# Patient Record
Sex: Female | Born: 1975
Health system: Southern US, Community
[De-identification: ages and names within clinical notes are randomized; demographics above are authoritative.]

## PROBLEM LIST (undated history)

## (undated) ENCOUNTER — Inpatient Hospital Stay (HOSPITAL_COMMUNITY): Payer: Self-pay

## (undated) DIAGNOSIS — I10 Essential (primary) hypertension: Secondary | ICD-10-CM

## (undated) DIAGNOSIS — M1990 Unspecified osteoarthritis, unspecified site: Secondary | ICD-10-CM

## (undated) DIAGNOSIS — E7800 Pure hypercholesterolemia, unspecified: Secondary | ICD-10-CM

## (undated) DIAGNOSIS — F32A Depression, unspecified: Secondary | ICD-10-CM

## (undated) DIAGNOSIS — K219 Gastro-esophageal reflux disease without esophagitis: Secondary | ICD-10-CM

## (undated) DIAGNOSIS — J45909 Unspecified asthma, uncomplicated: Secondary | ICD-10-CM

## (undated) DIAGNOSIS — D571 Sickle-cell disease without crisis: Secondary | ICD-10-CM

## (undated) DIAGNOSIS — K5090 Crohn's disease, unspecified, without complications: Secondary | ICD-10-CM

## (undated) DIAGNOSIS — D649 Anemia, unspecified: Secondary | ICD-10-CM

## (undated) DIAGNOSIS — T7840XA Allergy, unspecified, initial encounter: Secondary | ICD-10-CM

## (undated) DIAGNOSIS — F419 Anxiety disorder, unspecified: Secondary | ICD-10-CM

## (undated) DIAGNOSIS — N61 Mastitis without abscess: Secondary | ICD-10-CM

## (undated) HISTORY — PX: CHOLECYSTECTOMY: SHX55

## (undated) HISTORY — DX: Anemia, unspecified: D64.9

## (undated) HISTORY — DX: Sickle-cell disease without crisis: D57.1

## (undated) HISTORY — DX: Depression, unspecified: F32.A

## (undated) HISTORY — DX: Unspecified asthma, uncomplicated: J45.909

## (undated) HISTORY — DX: Allergy, unspecified, initial encounter: T78.40XA

## (undated) HISTORY — DX: Anxiety disorder, unspecified: F41.9

## (undated) HISTORY — DX: Unspecified osteoarthritis, unspecified site: M19.90

## (undated) HISTORY — DX: Mastitis without abscess: N61.0

---
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
## 2001-06-22 ENCOUNTER — Other Ambulatory Visit: Admission: RE | Admit: 2001-06-22 | Discharge: 2001-06-22 | Payer: Self-pay | Admitting: Family Medicine

## 2001-08-16 ENCOUNTER — Ambulatory Visit (HOSPITAL_COMMUNITY): Admission: RE | Admit: 2001-08-16 | Discharge: 2001-08-16 | Payer: Self-pay | Admitting: General Surgery

## 2001-08-16 ENCOUNTER — Encounter: Payer: Self-pay | Admitting: General Surgery

## 2001-08-21 ENCOUNTER — Ambulatory Visit (HOSPITAL_COMMUNITY): Admission: RE | Admit: 2001-08-21 | Discharge: 2001-08-21 | Payer: Self-pay | Admitting: General Surgery

## 2001-08-21 ENCOUNTER — Encounter: Payer: Self-pay | Admitting: General Surgery

## 2001-08-29 ENCOUNTER — Ambulatory Visit (HOSPITAL_COMMUNITY): Admission: RE | Admit: 2001-08-29 | Discharge: 2001-08-29 | Payer: Self-pay | Admitting: General Surgery

## 2001-11-23 ENCOUNTER — Encounter: Payer: Self-pay | Admitting: Internal Medicine

## 2001-11-23 ENCOUNTER — Ambulatory Visit (HOSPITAL_COMMUNITY): Admission: RE | Admit: 2001-11-23 | Discharge: 2001-11-23 | Payer: Self-pay | Admitting: Internal Medicine

## 2002-01-01 ENCOUNTER — Encounter: Admission: RE | Admit: 2002-01-01 | Discharge: 2002-04-01 | Payer: Self-pay | Admitting: Family Medicine

## 2002-02-06 ENCOUNTER — Ambulatory Visit (HOSPITAL_COMMUNITY): Admission: RE | Admit: 2002-02-06 | Discharge: 2002-02-06 | Payer: Self-pay | Admitting: Obstetrics and Gynecology

## 2002-02-06 ENCOUNTER — Encounter: Payer: Self-pay | Admitting: Obstetrics and Gynecology

## 2002-04-23 ENCOUNTER — Ambulatory Visit (HOSPITAL_COMMUNITY): Admission: RE | Admit: 2002-04-23 | Discharge: 2002-04-23 | Payer: Self-pay | Admitting: Obstetrics and Gynecology

## 2002-08-08 ENCOUNTER — Emergency Department (HOSPITAL_COMMUNITY): Admission: EM | Admit: 2002-08-08 | Discharge: 2002-08-08 | Payer: Self-pay | Admitting: Emergency Medicine

## 2002-08-08 ENCOUNTER — Encounter: Payer: Self-pay | Admitting: Emergency Medicine

## 2003-12-24 ENCOUNTER — Ambulatory Visit (HOSPITAL_COMMUNITY): Admission: RE | Admit: 2003-12-24 | Discharge: 2003-12-24 | Payer: Self-pay | Admitting: Family Medicine

## 2005-01-03 ENCOUNTER — Emergency Department (HOSPITAL_COMMUNITY): Admission: EM | Admit: 2005-01-03 | Discharge: 2005-01-03 | Payer: Self-pay | Admitting: Emergency Medicine

## 2006-08-26 ENCOUNTER — Emergency Department (HOSPITAL_COMMUNITY): Admission: EM | Admit: 2006-08-26 | Discharge: 2006-08-26 | Payer: Self-pay | Admitting: Emergency Medicine

## 2006-09-20 ENCOUNTER — Emergency Department (HOSPITAL_COMMUNITY): Admission: EM | Admit: 2006-09-20 | Discharge: 2006-09-20 | Payer: Self-pay | Admitting: Emergency Medicine

## 2006-09-24 ENCOUNTER — Emergency Department (HOSPITAL_COMMUNITY): Admission: EM | Admit: 2006-09-24 | Discharge: 2006-09-24 | Payer: Self-pay | Admitting: Emergency Medicine

## 2006-12-24 ENCOUNTER — Inpatient Hospital Stay (HOSPITAL_COMMUNITY): Admission: EM | Admit: 2006-12-24 | Discharge: 2006-12-27 | Payer: Self-pay | Admitting: Emergency Medicine

## 2007-01-16 ENCOUNTER — Inpatient Hospital Stay (HOSPITAL_COMMUNITY): Admission: AD | Admit: 2007-01-16 | Discharge: 2007-01-17 | Payer: Self-pay | Admitting: Obstetrics & Gynecology

## 2007-01-23 ENCOUNTER — Inpatient Hospital Stay (HOSPITAL_COMMUNITY): Admission: AD | Admit: 2007-01-23 | Discharge: 2007-02-28 | Payer: Self-pay | Admitting: Obstetrics and Gynecology

## 2007-02-26 ENCOUNTER — Ambulatory Visit (HOSPITAL_COMMUNITY): Admission: RE | Admit: 2007-02-26 | Discharge: 2007-02-26 | Payer: Self-pay | Admitting: Obstetrics and Gynecology

## 2007-02-28 ENCOUNTER — Ambulatory Visit (HOSPITAL_COMMUNITY): Admission: RE | Admit: 2007-02-28 | Discharge: 2007-02-28 | Payer: Self-pay | Admitting: Obstetrics and Gynecology

## 2007-04-06 ENCOUNTER — Inpatient Hospital Stay (HOSPITAL_COMMUNITY): Admission: AD | Admit: 2007-04-06 | Discharge: 2007-04-06 | Payer: Self-pay | Admitting: Obstetrics and Gynecology

## 2007-05-12 ENCOUNTER — Inpatient Hospital Stay (HOSPITAL_COMMUNITY): Admission: AD | Admit: 2007-05-12 | Discharge: 2007-05-16 | Payer: Self-pay | Admitting: Obstetrics and Gynecology

## 2007-05-13 ENCOUNTER — Encounter (INDEPENDENT_AMBULATORY_CARE_PROVIDER_SITE_OTHER): Payer: Self-pay | Admitting: Obstetrics and Gynecology

## 2007-09-26 ENCOUNTER — Emergency Department (HOSPITAL_COMMUNITY): Admission: EM | Admit: 2007-09-26 | Discharge: 2007-09-26 | Payer: Self-pay | Admitting: Emergency Medicine

## 2010-05-21 ENCOUNTER — Ambulatory Visit (HOSPITAL_COMMUNITY): Admission: RE | Admit: 2010-05-21 | Discharge: 2010-05-21 | Payer: Self-pay | Admitting: Family Medicine

## 2010-06-30 ENCOUNTER — Ambulatory Visit (HOSPITAL_COMMUNITY): Admission: RE | Admit: 2010-06-30 | Discharge: 2010-06-30 | Payer: Self-pay | Admitting: Family Medicine

## 2010-11-28 ENCOUNTER — Encounter: Payer: Self-pay | Admitting: Family Medicine

## 2011-01-06 HISTORY — PX: LAPAROSCOPIC ENDOMETRIOSIS FULGURATION: SUR769

## 2011-01-07 ENCOUNTER — Other Ambulatory Visit: Payer: Self-pay | Admitting: Obstetrics and Gynecology

## 2011-01-31 ENCOUNTER — Encounter (HOSPITAL_COMMUNITY)
Admission: RE | Admit: 2011-01-31 | Discharge: 2011-01-31 | Disposition: A | Discharge status: Home / Self Care | Payer: BC Managed Care – PPO | Source: Ambulatory Visit | Attending: Obstetrics and Gynecology | Admitting: Obstetrics and Gynecology

## 2011-01-31 LAB — BASIC METABOLIC PANEL
BUN: 4 mg/dL — ABNORMAL LOW (ref 6–23)
CO2: 25 mEq/L (ref 19–32)
Calcium: 9.2 mg/dL (ref 8.4–10.5)
Chloride: 108 mEq/L (ref 96–112)
Creatinine, Ser: 0.62 mg/dL (ref 0.4–1.2)
GFR calc Af Amer: >60 mL/min (ref >60)
GFR calc non Af Amer: >60 mL/min (ref >60)
Glucose, Bld: 112 mg/dL — ABNORMAL HIGH (ref 70–99)
Potassium: 3.7 mEq/L (ref 3.5–5.1)
Sodium: 139 mEq/L (ref 135–145)

## 2011-01-31 LAB — CBC
HCT: 40.4 % (ref 36.0–46.0)
Hemoglobin: 13.4 g/dL (ref 12.0–15.0)
MCH: 25.7 pg — ABNORMAL LOW (ref 26.0–34.0)
MCHC: 33.2 g/dL (ref 30.0–36.0)
MCV: 77.5 fL — ABNORMAL LOW (ref 78.0–100.0)
Platelets: 265 10*3/uL (ref 150–400)
RBC: 5.21 MIL/uL — ABNORMAL HIGH (ref 3.87–5.11)
RDW: 13.7 % (ref 11.5–15.5)
WBC: 8.5 10*3/uL (ref 4.0–10.5)

## 2011-01-31 LAB — SURGICAL PCR SCREEN
MRSA, PCR: NEGATIVE
Staphylococcus aureus: NEGATIVE

## 2011-02-04 ENCOUNTER — Ambulatory Visit (HOSPITAL_COMMUNITY)
Admission: RE | Admit: 2011-02-04 | Discharge: 2011-02-04 | Disposition: A | Discharge status: Home / Self Care | Payer: BC Managed Care – PPO | Source: Ambulatory Visit | Attending: Obstetrics and Gynecology | Admitting: Obstetrics and Gynecology

## 2011-02-04 DIAGNOSIS — Z01812 Encounter for preprocedural laboratory examination: Secondary | ICD-10-CM | POA: Insufficient documentation

## 2011-02-04 DIAGNOSIS — R1032 Left lower quadrant pain: Secondary | ICD-10-CM | POA: Insufficient documentation

## 2011-02-04 DIAGNOSIS — Z01818 Encounter for other preprocedural examination: Secondary | ICD-10-CM | POA: Insufficient documentation

## 2011-02-04 LAB — GLUCOSE, CAPILLARY: Glucose-Capillary: 82 mg/dL (ref 70–99)

## 2011-02-04 LAB — PREGNANCY, URINE: Preg Test, Ur: NEGATIVE

## 2011-02-07 LAB — GLUCOSE, CAPILLARY: Glucose-Capillary: 108 mg/dL — ABNORMAL HIGH (ref 70–99)

## 2011-02-23 NOTE — Op Note (Signed)
  [name], [name]            ACCOUNT NO.:  [id number]  MEDICAL RECORD NO.:  [id number]           PATIENT TYPE:  O  LOCATION:  [facility]                          FACILITY:  [facility]  PHYSICIAN:  Servando Salina, M.D.DATE OF BIRTH:  1976-03-02  DATE OF PROCEDURE:  02/04/2011 DATE OF DISCHARGE:                              OPERATIVE REPORT   PREOPERATIVE DIAGNOSIS:  Left lower quadrant pain.  POSTOPERATIVE DIAGNOSIS:  Left lower quadrant pain.  PROCEDURE:  Diagnostic laparoscopy.  ANESTHESIA:  General.  SURGEON.:  Servando Salina, MD  ASSISTANT:  Artelia Laroche, CNM  PROCEDURE:  Under adequate general anesthesia, the patient was placed in dorsal lithotomy position.  She was sterilely prepped and draped in usual fashion.  An indwelling Foley catheter was sterilely placed. Examination under anesthesia revealed an anteverted uterus, slightly irregular.  No adnexal masses could be appreciated.  A bivalve speculum was placed in vagina.  Single-tooth tenaculum was placed on the anterior lip of the cervix and acorn cannula was introduced into the cervical os and attached to the tenaculum for manipulation of the uterus.  The bivalve speculum was then removed.  Attention was then turned to the abdomen.  A supraumbilical incision was made.  Veress needle was introduced.  Saline was used to test the placement of the Veress needle. The opening pressure of 7 was noted.  Three liters of CO2 was insufflated. The Veress needle was then removed.  A 12-mm trocar with sleeve was introduced into the abdomen without incident.  Using [employer] robotic camera, the pelvis was inspected.  The uterus was irregular in shape.  The posterior cul-de-sac without any lesions.  The left ovary was elongated, but otherwise normal.  Both tubes were normal bilaterally.  No evidence of endometriosis or any lesions noted. Complete upper abdomen was also normal.  The procedure was therefore completed by the port  being removed.  The abdomen deflated.  The incisions closed with 0 Vicryl for fascial stitch and 4-0 Vicryl subcuticular stitch and the instruments in the vagina removed.  SPECIMENS:  None.  ESTIMATED BLOOD LOSS:  Minimal.  COMPLICATIONS:  None.  The patient tolerated procedure well, was transferred to recovery room in stable condition.     Servando Salina, M.D.     [place]/MEDQ  D:  02/04/2011  T:  02/05/2011  Job:  [id number]  Electronically Signed by Alanda Slim Chany Woolworth M.D. on 02/23/2011 03:57:40 PM

## 2011-03-22 NOTE — Discharge Summary (Signed)
[name], [name]             ACCOUNT NO.:  [id number]   MEDICAL RECORD NO.:  [id number]          PATIENT TYPE:  INP   LOCATION:  9133                          FACILITY:  [facility]   PHYSICIAN:  Naima A. Dillard, M.D. DATE OF BIRTH:  1976/05/02   DATE OF ADMISSION:  05/12/2007  DATE OF DISCHARGE:  05/16/2007                               DISCHARGE SUMMARY   ADMISSION DIAGNOSIS:  1. Intrauterine pregnancy at 38-5/7 weeks.  2. History of incompetent cervix with cerclage removal at 36 weeks.  3. Insulin-dependent diabetes with the patient managed on insulin      regimen at home.  4. Chronic hypertension with Aldomet 500 mg t.i.d.  5. Group B strep negative.   DISCHARGE DIAGNOSES:  1. Macrosomia.  2. Failure to progress.  3. Failed induction.  4. Chorioamnionitis.   PROCEDURE:  Primary low transverse cesarean section.   HOSPITAL COURSE:  The patient was admitted on the morning of the May 12, 2007. Her cervix was 3 cm, 70%, vertex, minus two. Membranes were  ruptured and low-dose Pitocin was started per protocol. Blood pressures  remained stable. IUPC was placed in the afternoon of July 5. By 10:00  p.m. the patient progressed to 4-5 cm, MVU's were still not adequate. By  9:00 in the morning the patient had a temperature of 100.4, blood sugars  were stable.  Fetal heart tones were stable.  Cervix was 8 cm, 100%  effaced, vertex at the +1 station.  The patient had been without  cervical change for greater than 4 hours.  She was offered a cesarean  section and agreed to proceed.  She was prepared for the operating room  and underwent cesarean section by Dr. Charlesetta Garibaldi with Donnel Saxon  certified nurse midwife as first assistant. Infant was a viable female  with Apgars of 9 and 10, delivery time was 10:04 a.m.  Weight was 9  pounds 15 ounces. Infant was taken to the full-term nursery in good  condition.  The patient was taken to the recovery room and was doing  well. By postop day #1  she was breast-feeding, hemoglobin was 12.4 and  had been 13.9 preoperatively, blood pressures were stable.  She was  taking hydrochlorothiazide. She started metformin in the morning of day  one. Postop fasting blood sugar was 122, 2-hour p.c. dinner the night  before on July 6 had been 209. By postop day #2 the patient was doing  well.  Fasting blood sugar was 133, 2-hour p.c. were 132 and 166. By  postop day #3 she had changed to bottle feedings, fasting blood sugar  was 112, 2-hour PC dinner been 118, blood pressure was stable at 121/72.  Her JP drain had partially removed and removal was complete by the CNM  prior to discharge. The patient was deemed to have received full benefit  of her hospital stay and she was discharged home.   DISCHARGE MEDICATIONS:  1. Motrin 600 mg one p.o. q.6 hours p.r.n. pain.  2. Tylox one to two p.o. q.3-4 hours p.r.n. pain.  3. Metformin 500 mg one p.o. t.i.d.  4.  Humalog sliding scale was given to the patient.   Discharge follow-up will occur at [facility] in 4 weeks or  as needed.  The patient is also to call with fasting blood sugars  continually greater than 90, 2-hour postprandial greater than 120.      Cindy [name], C.N.M.      Naima A. Charlesetta Garibaldi, M.D.  Electronically Signed    [place]/MEDQ  D:  05/16/2007  T:  05/16/2007  Job:  [id number]

## 2011-03-22 NOTE — Op Note (Signed)
NAMEVILMA, WILL             ACCOUNT NO.:  [id number]   MEDICAL RECORD NO.:  [id number]          PATIENT TYPE:  INP   LOCATION:  9133                          FACILITY:  [facility]   PHYSICIAN:  Naima A. Dillard, M.D. DATE OF BIRTH:  1975/11/26   DATE OF PROCEDURE:  05/12/2007  DATE OF DISCHARGE:                               OPERATIVE REPORT   PREOPERATIVE DIAGNOSIS:  Pregnancy at term, type 2 diabetes on insulin,  chronic hypertension, failure to progress, failed induction, and  chorioamnionitis.   POSTOPERATIVE DIAGNOSIS:  Pregnancy at term, type 2 diabetes on insulin,  chronic hypertension, failure to progress, failed induction, and  chorioamnionitis.   PROCEDURE:  Primary low transverse cesarean section.   ANESTHESIA:  Epidural.   SURGEON:  Dr. Charlesetta Garibaldi   ASSISTANT:  Cathlean Marseilles. Latham, C.N.M.   ESTIMATED BLOOD LOSS:  650 mL.   URINE OUTPUT:  Is 500 mL.   IV FLUIDS:  1300 mL crystalloid.   COMPLICATIONS:  None.   FINDINGS:  Are female infant in vertex presentation with clear fluid.  Weight was 9 pounds 15 ounces with Apgars of 9 and 10. Normal appearing  tubes and ovaries.  The patient was noted to have uterine fibroids and  one about 5 cm at the fundus and one on the patient's left side of the  cornu about 3 cm.  Normal-appearing tubes and ovaries.  Placenta was  sent to pathology.   DESCRIPTION OF PROCEDURE:  The patient was taken to the operating room  where her epidural anesthesia was found to be adequate.  She was placed  in dorsal supine position with left lateral tilt.  The Pfannenstiel skin  incision was made with the scalpel and carried down to the fascia using  Bovie cautery and the fascia was incised in the midline and extended  bilaterally with Mayo scissors.  Kochers x2 were placed in the superior  aspect of the fascia was dissected off the rectus muscles both sharply  and bluntly.  The inferior aspect of the fascia was dissected in similar  fashion.   Peritoneum was identified, tented up and entered sharply  Metzenbaum scissors and extended superiorly and inferiorly with good  visualization of bowel and bladder and the muscle was separated midline.  The bladder blade was inserted.  Vesicouterine peritoneum was  identified, tented up and entered sharply and extended transversely  bilaterally using Metzenbaum scissors.  The bladder blade was  reinserted.  A primary low transverse uterine incision was made with the  scalpel and extended bluntly transversely.  The head was delivered and  the body without difficulty.  Cord was clamped and cut.  Placenta was  removed without difficulty.  Cord blood was a obtained by the cord blood  banking personnel and the uterus was cleared of all clot and debris.  The uterine incision was repaired with 0 Vicryl in a running locked  fashion.  A second layer of 0 Vicryl was used to imbricate the  transverse uterine incision. Irrigation was done. During this time,  anesthesia noted that there was some blood tinge in the urine. Indigo  carmine  was then given to the patient.  Once blue reached the Foley  catheter there was no blue seen and the abdomen or pelvis.  The  peritoneum was closed using 0 chromic.  After all areas were noted to be  hemostatic.  The muscles were seen to be hemostatic.  The fascia was  closed using 0 Vicryl in a running locked fashion.  A JP drain was  placed in subcutaneous tissue. Subcutaneous tissue was reapproximated  using 2-0 plain and the skin was reapproximated 3-0 Monocryl.  The  Jackson-Pratt drain was placed on the patient's right side in the right  lower quadrant. Sponge, lap and needle counts were correct.  The patient  went to recovery room in stable condition.      Naima A. Charlesetta Garibaldi, M.D.  Electronically Signed     NAD/MEDQ  D:  05/13/2007  T:  05/13/2007  Job:  [id number]

## 2011-03-22 NOTE — H&P (Signed)
Cindy [name], Cindy [name]             ACCOUNT NO.:  [id number]   MEDICAL RECORD NO.:  [id number]          PATIENT TYPE:  MAT   LOCATION:  [facility]                          FACILITY:  [facility]   PHYSICIAN:  Naima A. Dillard, M.D. DATE OF BIRTH:  Oct 16, 1976   DATE OF ADMISSION:  05/12/2007  DATE OF DISCHARGE:                              HISTORY & PHYSICAL   HISTORY OF PRESENT ILLNESS:  Cindy [name] is a 35 year old gravida 1,  para 0 with an EDC of May 21, 2007, making her 38-5/7 weeks who  presents for admission for induction secondary to insulin-dependent  diabetes, chronic hypertension and history of incompetent cervix.  Pregnancy has been remarkable for:  1. Insulin-dependent diabetic prior pregnancy.  2. Chronic hypertension on Aldomet.  3. Incompetent cervix with cerclage from approximately 18 weeks to      approximately 36 weeks.   PRENATAL LABORATORIES:  Blood type A positive, Rh antibody negative,  VDRL nonreactive, rubella titer positive, hepatitis B surface antigen  negative, HIV nonreactive, sickle cell test was negative.  Hemoglobin  A1C was 8 in November 2007.  It was 6.9 in February 2008.  Quadruple  screen was normal.  Cap GC and chlamydia obtained as her first prenatal  visit were negative.  Group B strep culture done the end of March was  negative.  Group B strep culture done at 36 weeks was also negative.  Hemoglobin upon entry into obstetrical care was 13.5.  EDC of May 21, 2007, was established by last menstrual period and 9-week ultrasound.   HISTORY OF PRESENT PREGNANCY:  Patient entered care at [facility] at 21-3/7 weeks in transfer from [facility] in [place].  The patient had a cerclage on February 18 at  [facility] due to a cervical diltation of 3 cm in bulging  membranes.  The patient's history was also remarkable for insulin  dependent diabetic prior to pregnancy with patient on insulin 20 units  in the morning  and 10 units in the evening of N.  Humulin regular 15  units in the morning and 10 units in the p.m.  That was at her initial  prenatal visit.  She also has chronic hypertension and was on Aldomet  250 mg every day.  At the time of the patient's presentation on March 6,  she was referred directly to physician care in light of all of her  circumstances.  Dr. Raphael Gibney saw her on March 10.  Fastings were  anywhere from 98-132.  Two hour PCs were 103-160.  Issues were reviewed  with her diabetes.  Aldomet was elevated to 250 mg p.o. b.i.d.  She was  seen the following week by Dr. Charlesetta Garibaldi.  Fastings remained somewhat  elevated as well as to two hour PCs.  Insulin regimen was adjusted.  Again, at 23 weeks, patient had a blood pressure of 120/80.  Aldomet was  increased to 500 mg p.o. b.i.d.  She had an ultrasound at that time of  23 weeks showing cervix 1.03 cm long and dilated 1.59 cm with funneling  past the  sutures of the cerclage.  It was a marginal cord insertion.  Membranes were seen at external os.  Stitch and tack as visible.  Patient was admitted to [facility] hospital on that day of March 18 and  remained in the hospital until April 23.  During that time, she was  placed on Trendelenburg.  She was also placed on Progesterone  suppositories, and she received betamethasone.  Her blood sugars were  managed on an insulin sliding scale.  She had a hemoglobin A1c on March  18 of 6.7.  Blood chemistries were normal and hemoglobin was stable.  She had another ultrasound on March 24 showing a cervical length of 1.9  cm with funneling and dilation.  That fluid volume was normal.  She had  another ultrasound on April 21 at [redacted] weeks gestation showing growth in  the 76 percentile.  Normal fluid at 15 percentile.  Cervical length at  that time was noted to be 2.5 cm with minimal funneling.  There was some  funneling noted with Valsalva and fundal pressure, but this did not  extend to the level of the  cerclage.  Consult was had with __________  Medicine, and the decision was made to allow the patient to continue the  regimen at home.  She was taught to administer the progesterone  suppositories.  She had another ultrasound on the day of discharge at 28  weeks and two days with normal fluid, and the cervical length was 3 cm.  There was some dynamic change noted with fundal pressure, but the  cerclage was intact.  The patient was discharged home to be on bedrest.  She was to be on Humulin R at breakfast and 20 units with dinner and  Humulin NPH 28 units with breakfast, 44 units at bedtime with a sliding  scale of cover.  She was also given Protonix.  She had a negative Group  B strep culture while she was in the hospital.  The patient was seen at  the office again on April 29.  Cervix was 2.52 cm long with intact  sutures.  There was some funneling to the sutures.  Growth was at the 73  percentile and normal fluid.  Insulin regimen was continued.  Chronic  hypertension was noted to be stable.  By 31 weeks, her insulin regimen  was adjusted again with an elevation of her dinner Humulin R to 24  units.  Twice weekly NSTs were also begun at 32 weeks.  She had another  ultrasound at that time showing growth at the 82 percentile and normal  fluid with a BPP of 8/8 and cervical length 3.7 cm.  Insulin regimens  again were adjusted.  She had another ultrasound at 33 weeks showing  cervix 3.1 cm and a BPP of 8/8.  Another ultrasound was performed at 34  weeks showing growth at the 90 percentile, cervix was 2.98 cm, BPP was  8/8, fasting blood sugars were 60-85, two hour PCs were 65 to 163.  At  that time, she was on 28 units of N in the morning, 19 units of R in the  morning, 32 units of regular with dinner and 44 units of N at bedtime.  The rest of the patient's hospital course was essentially uncomplicated,  although, she was managed very closely with watching her blood sugar  values and her  antenatal testing.  She is now scheduled for induction on  May 12, 2007.   OBSTETRICAL HISTORY:  The  patient is primigravida.   PAST MEDICAL HISTORY:  1. She was diagnosed with fibroids in 2003 that have not required any      treatment.  2. She reports usual childhood illnesses.  3. She was diagnosed with chronic hypertension in 2003.  4. She was taking Aldomet prior to pregnancy.  5. She was diagnosed with insulin-dependent diabetes as well in 2003.  6. She has had 1-2 UTIs.  7. She had pyelonephritis in 2006.   PAST SURGICAL HISTORY:  1. Cerclage placed on December 25, 2006.  2. Ganglion cyst removed in her right hand in 2000.   ALLERGIES:  None.   FAMILY HISTORY:  Maternal grandmother and maternal grandfather both have  hypertension and are on medication.  Her sister has anemia.  Maternal  grandmother is a type I diabetic.  Father has epilepsy and is deceased.  Maternal grandfather was an alcohol user.  Father was a drug user.  Genetic history is remarkable for the father having a heart murmur and  the patient's sister having sickle cell trait.   SOCIAL HISTORY:  The patient is single.  Father of the baby has been  involved and is supportive.  His name is [employer].  The patient  has one year of college.  She has been attending nursing school.  The  patient is [place] [employer] of the [employer].  Her partner has high  school education.  He is employed in the [employer].  She has been  followed by the physician service at [facility].  She denies  any alcohol, drug or tobacco use during this pregnancy.   PHYSICAL EXAMINATION:  VITAL SIGNS:  Stable.  The patient is afebrile.  HEENT:  Within normal limits.  LUNGS:  Breath sounds are clear.  HEART:  Regular rate and rhythm without murmur.  BREASTS:  Soft and nontender.  ABDOMEN:  Fundal height is approximately 38 cm.  Estimated fetal weight  is 7 pounds.  Uterine contractions have been occasional and  mild.  Cervical exam is deferred.  At this time, fetal heart rate has been in  the 140s to 150s by Doppler in the office with reactive NSTs throughout.  EXTREMITIES:  Deep tendon reflexes are 2+ without clonus.  There is a  trace edema noted.  Patient's cervical cerclage was removed at  approximately 36 weeks.   IMPRESSION:  1. Intrauterine pregnancy at 38-5/7 weeks.  2. History of an incompetent cervix with cerclage removed at      approximately 36 weeks.  3. Insulin-dependent diabetes with patient managed on an insulin      regimen at home.  4. Chronic hypertension with patient on Aldomet 500 mg p.o. b.i.d.  5. Group B strep negative.   PLAN:  1. Admit to birthing suite for consult with Crawford Givens as attending      physician.  2. Routine physician orders.  3. Dr.  Charlesetta Garibaldi will plan management of the patient's blood sugars.  4. Dr.  Charlesetta Garibaldi will also plan initiation of Pitocin after the      patient's admission for induction of labor.      Cathlean Marseilles Cira Servant, C.N.M.      Naima A. Charlesetta Garibaldi, M.D.  Electronically Signed    VLL/MEDQ  D:  05/11/2007  T:  05/11/2007  Job:  [id number]

## 2011-03-25 NOTE — Discharge Summary (Signed)
Cindy Long, Cindy Long NO.:  [id number]   MEDICAL RECORD NO.:  [id number]          PATIENT TYPE:  OUT   LOCATION:  MFM                           FACILITY:  [facility]   PHYSICIAN:  Dede Query. Rivard, M.D. DATE OF BIRTH:  [birth year]   DATE OF ADMISSION:  02/28/2007  DATE OF DISCHARGE:  02/28/2007                               DISCHARGE SUMMARY   ADMISSION DIAGNOSES:  1. Intrauterine pregnancy at 23-2/7 weeks.  2. Incompetent cervix with cervical cerclage, cervical dilation and      funneling on ultrasound with membranes noted into the vagina passed      cervical os.  3. Insulin dependent diabetes.  4. Chronic hypertension.   DISCHARGE DIAGNOSIS:  1. Intrauterine pregnancy at 28-2/7 weeks.  2. Incompetent cervix with cervical cerclage stable.  3. Chronic hypertension.  4. Insulin dependent diabetes.   HISTORY OF PRESENT ILLNESS:  Cindy Long is a 35 year old gravida 1,  para 0 who presented at 23-1/7 weeks,  EDD May 21, 2007. She presented  to the EMS from the office of [facility] following ultrasound examination  which showed dilation of the patient's cervix to 1.3 cm and funneling as  well as protrusion of the membranes through the cervical os.  The  patient is known to have an incompetent cervix with the cervical  cerclage.  She was therefore admitted to [facility]  on complete bed rest in Trendelenburg position.  Maternal fetal medicine  consult agreed with above plan. Decision was made in 24 weeks to begin  the patient on progesterone suppositories 200 mg p.o. daily. She also  received betamethasone 12.5 mg IM times two doses 24 hours apart at 24  weeks.  Her blood sugars have been managed on an insulin sliding scale  and her hypertension has not been an issue. Lab work throughout her  admission has been stable.  Her hemoglobin on admission was 11.7 and it  was 12.3 on February 25, 2007.  Her chemistry values on February 25, 2007 were  within normal  limits. Hemoglobin A1c on January 23, 2007, 6.7.  The  patient has had t.i.d. fetal monitoring which has remained reassuring  throughout her entire hospitalization.  She has not had any contractions  or pain on monitoring throughout her long hospital stay. She has been  afebrile.  She has been followed daily in focus group meetings and  progression notes have been written. She has had PT during bedrest. The  patient had repeat ultrasound done at 24 weeks on January 29, 2007. This  showed a cervical length of 1.9 cm, funnel width was 1.3 cm. Internal os  was dilated and funneling was noted.  Amniotic fluid volume was normal  at that time. Reevaluation by ultrasound done on February 26, 2007 at [redacted]  weeks gestation showed estimated fetal weight at 1377 grams or 3 pounds  1 ounce at [employer]. AFI was 10.5 cm at the 15th percentile,  within normal limits. Cervical length was noted to be 2.5 cm. Cerclage  was noted with minimal funneling, the cervix was noted to be 2.5-2.8 cm  and length, there was funneling noted with Valsalva and fundal pressure,  but this did not extend down to the level of the cerclage. At this time  recommendation was made that the patient could be followed on an  outpatient basis. Physical therapy work with the patient and increase  mobility and strength for discharge home.  The patient was taught to  administer her progesterone suppositories independently as well as  follow her diabetic diet and sliding scale insulin coverage.  Reevaluation by ultrasound was done today on February 28, 2007 at 28 weeks  2 days gestation. AFI is normal at 15.7 cm. A transvaginal ultrasound  cervical length was performed and found to be approximately 3 cm,  dynamic change was noted with fundal pressure and Valsalva to a residual  length of 0.88-1.13 cm. This is slightly decreased from prior exam.  Cervical cerclage suture appears intact. Recommendation is to repeat  ultrasound in 1 week to  reevaluate cervical length and in 3-4 weeks to  evaluate fetal growth. Outpatient management with bedrest at home may be  considered. Therefore the patient is discharged home today at 28 weeks 2  days in stable condition. She has been given a prescription for Humulin  NPH, she is to take 28 units with breakfast, 44 units at bedtime.  She  was given a prescription for Humulin R insulin to take 19 units with  breakfast and 20 units with dinner. These instructions were written down  for the patient as well as her sliding scale insulin to be used with  fasting and 2-hour postprandial blood sugars. She is given a  prescription for Protonix 40 mg p.o. daily in a.m. and progesterone  suppository 200 mg one suppository per vagina q.a.m.  She is instructed  to continue her daily prenatal vitamin and Colace three times a day.  She may take MiraLax as needed for constipation. She is to follow a  diabetic, carbohydrate modified diet at home and she will follow up at  the office of [facility] on March 06, 2007 at 2:00 p.m. with Dr. Charlesetta Garibaldi as  well as for OB ultrasound and cervical length measurement. The patient  is instructed to call for any signs or symptoms of preterm labor,  rupture of mental membranes, any vaginal bleeding, any problems or  concerns.      Cindy Long, C.N.M.      Dede Query Rivard, M.D.  Electronically Signed    SDM/MEDQ  D:  02/28/2007  T:  02/28/2007  Job:  [id number]

## 2011-03-25 NOTE — H&P (Signed)
Cindy Long, CORY             ACCOUNT NO.:  [id number]   MEDICAL RECORD NO.:  [id number]          PATIENT TYPE:  INP   LOCATION:  9149                          FACILITY:  [facility]   PHYSICIAN:  Naima A. Dillard, M.D. DATE OF BIRTH:  02-12-1976   DATE OF ADMISSION:  01/23/2007  DATE OF DISCHARGE:                              HISTORY & PHYSICAL   1. Cindy Long is a 35 year old gravida 1, para 0, who presents at 35-      1/7 weeks, EDD May 21, 2007.  She presents via EMS from office      [facility] following ultrasound examination which showed dilation of the      patient's cervix to 1.3 cm and funneling as well as protrusion of      membranes through os, the patient with incompetent cervix and      cervical cerclage.  She presented to the office for routine      evaluation today and has not experienced any contractions,      cramping, or pain.  No vaginal bleeding.  No rupture of membranes.      She has not had any fever, no signs or symptoms of infection, no      nausea or vomiting.  She is not complaining of any headache, visual      changes, or epigastric pain.  Her pregnancy has been followed by      the MD service at [facility].  She transferred care in following      placement of her cerclage for early funneling of her cervix.  He      states he is  2. Chronic hypertension.  3. Insulin dependent diabetes.   MEDICAL HISTORY:  1. Significant for chronic hypertension.  2. Insulin dependent diabetes.  3. She had a cyst removed from her right hand 6 years ago.  4. Cervical cerclage placed in February 2008.   OB HISTORY:  Current pregnancy.   ALLERGIES:  No known drug allergies.   The patient denies the use of tobacco, alcohol, or illicit drugs.   CURRENT MEDICATIONS:  1. Aldomet 250 mg p.o. b.i.d.  2. Humulin N 20 units in a.m. and 10 units at bedtime.  3. Humulin R 15 units in a.m. and 10 units at bedtime.   FAMILY HISTORY:  Significant for heart disease in the patient's father  and paternal grandfather, chronic hypertension in the patient's father,  diabetes in the patient's mother and maternal grandmother.   SOCIAL HISTORY:  Cindy Long is a single African American female.  She  is accompanied by several family members here to the hospital who are  all supportive.   REVIEW OF SYSTEMS:  As described above.  The patient presents from the  office of [place] with a history of incompetent cervix and cervical  cerclage following ultrasound showing dilation of cervix to 1.3 cm and  funneling with visualization of membranes coming through cervical os  into the vagina.   PHYSICAL EXAM:  VITAL SIGNS:  Stable, afebrile.  Temperature 98.0, pulse  88, respirations 18, blood pressure 123/78.  CBG on admission is 118.  HEENT:  Unremarkable.  HEART:  Regular rate and rhythm.  LUNGS:  Clear.  ABDOMEN:  Gravid in its contour.  Fundus is noted to be 2 cm above the  umbilicus with positive fetal heart tones.  Per ultrasound this morning,  AFI is normal and fetus is in the vertex presentation.  Dilation of the  cervix is noted to 1.3 cm with funneling on exam.  Membranes are seen  coming through the cervical os into the vagina.  Group B strep culture  was obtained.  EXTREMITIES:  No pathologic edema.  DTRs are 1+ with no clonus.   Abdomen is soft and nontender.  Fetal heart tones are noted to be in the  150s.  There are no contractions noted on tocodynamometer   ASSESSMENT:  1. Intrauterine pregnancy at 23-1/7 weeks.  2. 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  3. Chronic hypertension.  4. Insulin dependent diabetes.   PLAN:  Admit per Dr. Crawford Givens with orders as written.  The patient  is to be placed in Trendelenburg and monitored for contractions.  Medication and insulin orders were received.  The patient is to have PAS  compression stockings while in bed at night and had  stockings during the  day.  Physicians will follow.      Arlyn Leak, C.N.M.      Naima A. Charlesetta Garibaldi, M.D.  Electronically Signed    SDM/MEDQ  D:  01/23/2007  T:  01/23/2007  Job:  [id number]

## 2011-03-25 NOTE — Discharge Summary (Signed)
[name], Cindy Long             ACCOUNT NO.:  [id number]   MEDICAL RECORD NO.:  [id number]          PATIENT TYPE:  INP   LOCATION:  A415                          FACILITY:  [facility]   PHYSICIAN:  Jonnie Kind, M.D. DATE OF BIRTH:  1976/07/05   DATE OF ADMISSION:  DATE OF DISCHARGE:  02/20/2008LH                               DISCHARGE SUMMARY   ADMITTING DIAGNOSES:  1. Pregnancy 19 weeks, cervical incompetency, hourglassing membranes.  2. Type 2 diabetes mellitus.   DISCHARGE DIAGNOSES:  1. Pregnancy 19 weeks, cervical incompetency, hourglassing membranes,      improved.  2. Type 2 diabetes mellitus.   PROCEDURES:  Single stitch [employer] cerclage on December 25, 2006.   DISCHARGE MEDICATIONS:  1. Insulin NPH 20, Regular 15 q.a.m., NPH 10, Regular 10 q.p.m.  2. Metrogel per vagina q.h.s. x1 week.   FOLLOWUP:  One week [facility].   HOSPITAL SUMMARY:  The patient was admitted with hourglassing membranes,  received antibiotics, __________  for 24 hours. She was afebrile with  normal white count. No change in her cervix was noted. She was taken to  the OR for [employer] cerclage placed on the 18th. She was kept on  Indocin for two days, antibiotics for two days. Ampicillin 2 g every  four hours plus erythromycin. She did well. Cervix recheck showed  slightly watery mucoid discharge that appeared nonpurulent. Cervix  appeared excellent with good length. There has been absolutely no  contracting for 48 hours, 24 of which was without any Indocin. The  patient will be discharged home with instructions to watch her fever,  ruptured membranes, bleeding or anything that would require her to come  to the office. Followup one week [facility].      Jonnie Kind, M.D.  Electronically Signed     JVF/MEDQ  D:  12/27/2006  T:  12/27/2006  Job:  [id number]

## 2011-03-25 NOTE — Op Note (Signed)
NAMESAANVIKA, Cindy Long             ACCOUNT NO.:  [id number]   MEDICAL RECORD NO.:  [id number]          PATIENT TYPE:  INP   LOCATION:  A415                          FACILITY:  [facility]   PHYSICIAN:  Jonnie Kind, M.D. DATE OF BIRTH:  04-17-1976   DATE OF PROCEDURE:  DATE OF DISCHARGE:                                PROCEDURE NOTE   PREOPERATIVE:  Pregnancy 19 weeks, cervical incompetency.   POSTOPERATIVE:  Pregnancy 19 weeks, cervical incompetency.   PROCEDURES:  [employer] cerclage.   SURGEON:  Jonnie Kind, M.D.   ASSISTANT:  None.   ANESTHESIA:  General.   SPECIMEN:  None.   PATHOLOGY:  None.   ESTIMATED BLOOD LOSS:  Minimal.   COMPLICATIONS:  None.   DETAILS OF THE PROCEDURE:  The patient was taken to the OR, general  anesthesia introduced.  Patient placed in the high lithotomy  Trendelenburg position with a halothane inhalation agent, in order to  improve care, and in order to improve access to the cervix.  A large  Graves speculum was inserted, and the lateral side wall retractor  sneaked past it on one side.  Cervix showed visual dilation to 3+ cm,  with membranes just barely protruding past the external os.  We were  able to grasp the cervix with ring forceps traction at inferior and at  the edge of the cervical/vaginal junction.  We began a circumferential  [employer] cerclage using 0-Prolene monofilament suture, with very, very  superficial ringing of the cervix.  This then was gently placed on  traction with a ring forceps placed in the internal os, gently elevating  the membrane, until the cervix was tied with a surgeon's knot, pulling  the cervix closed, and then after that ring forceps could no longer be  passed through the internal os.  There was no significant  bleeding, other than the points where the stitch was placed.  Cervical  length was quite good at full length, at the end of the procedure  greater than 2 cm of cervix that was closed, and  patient went to  recovery in stable condition.  She received Indocin 25 mg p.o. q.6,  continue antibiotics for 24 hours.  Blood type is Rh positive.      Jonnie Kind, M.D.  Electronically Signed     JVF/MEDQ  D:  12/27/2006  T:  12/27/2006  Job:  [id number]

## 2011-03-25 NOTE — H&P (Signed)
[facility]  Patient:    Cindy Long, HASS Visit Number: [id number] MRN: [id number]          Service Type: Attending:  Eli Hose, M.D. Dictated by:   Eli Hose, M.D. Adm. Date:  04/24/02                           History and Physical  HISTORY OF PRESENT ILLNESS:  The patient is a 35 year old female, gravida 0, who presents for a diagnostic laparoscopy because of chronic pelvic pain.  The patient has had pelvic discomfort for several years.  She has been treated with Depo-Provera but this did not help her symptoms.  An ultrasound was performed that was within normal limits except for a 3 cm fibroid on her uterus.  The patient reports having dyspareunia as well.  Her discomfort has progressed to the point that she is having difficulty walking and sleeping. She wishes to proceed with operative evaluation.  The patient denies a history of sexually transmitted infections.  her most recent Pap smear was within normal limits.  She did have a CT scan of the abdomen which was thought to be normal.  A CT scan of the pelvis was also thought to be normal.  ALLERGIES:  No known drug allergies.  SOCIAL HISTORY:  The patient denies cigarette use, alcohol use, and recreational drug use.  REVIEW OF SYSTEMS:  The patient has a history of bladder infections.  Her most recent urine culture showed lactobacillus species only.  PAST MEDICAL HISTORY:  The patient has a history of hypertension, diabetes, and a history of irritable bowel syndrome with ulcers.  FAMILY HISTORY:  Noncontributory.  PHYSICAL EXAMINATION:  VITAL SIGNS:  Weight is 182 pounds.  HEENT:  Within normal limits.  CHEST:  Clear.  HEART:  Regular rate and rhythm.  BREASTS:  Without masses.  ABDOMEN:  Nontender.  EXTREMITIES:  Within normal limits and neurologic examination is grossly normal.  PELVIC EXAMINATION:  External genitalia is normal, vagina is normal, cervix  is normal.  Uterus is normal size, shape and consistency.  Adnexa has tenderness in the right lower quadrant. Rectovaginal examination confirms.  ASSESSMENT: 1. Pelvic pain. 2. A 3 cm uterine fibroid. 3. Dyspareunia.  PLAN: The patient will undergo a diagnostic laparoscopy.  She understands the indications for her procedure and she accepts the risk of, but not limited to, anesthetic complications, bleeding, infections, and possible damage to the surrounding organs. She understands that no guarantees can be given concerning the total relief of her discomfort. Dictated by:   Eli Hose, M.D. Attending:  Eli Hose, M.D. DD:  04/23/02 TD:  04/23/02 Job: [id number]

## 2011-03-25 NOTE — H&P (Signed)
Cindy Long, Cindy Long             ACCOUNT NO.:  [id number]   MEDICAL RECORD NO.:  [id number]          PATIENT TYPE:  INP   LOCATION:  S854                          FACILITY:  [facility]   PHYSICIAN:  Jonnie Kind, M.D. DATE OF BIRTH:  [birth year]   DATE OF ADMISSION:  12/24/2006  DATE OF DISCHARGE:  LH                              HISTORY & PHYSICAL   ADMISSION DATE:  1. Pregnancy, 19+ weeks gestation.  2. Cervical incompetency with hourglassing membranes visible at      external os.   HPI:  This 35 year old female, gravida 1, para 0, LMP August 12, 2006,  (the patient is sure), placing her [facility] May 21, 2007.  Ultrasound at 9  weeks corresponds.  She has not had her fetal screening ultrasounds to  date.  She is admitted after presenting with a 1-day history of heavy,  watery vaginal discharge beginning last night when she sat up.  She  gives no history of any bleeding.  She is a gravida 1, para 0 followed  through our office for type 2 diabetes.  She was on oral agents prior to  pregnancy and is currently on NPH 20 and Regular 15 q.a.m., 10 of NPH,  10 of Regular q.p.m.  When examined on the morning of December 24, 2006,  she has a normal fetal heart rate, is afebrile, mentions mild lower  abdominal discomfort noted yesterday.  No active contractions __________  on external monitoring.  Speculum shows a heavy amount of mucoid  discharge, nonpurulent with expulsion of some of the mucus plug.  The  cervix appears to be 2-3 cm dilated and membranes are visible even with  the external os.  Cervix appears to have some length to it by speculum  exam.  She is Nitrazine negative, fern negative.  GC, Chlamydia, and  group B strep cultures are obtained.  She is admitted for management of  suspected cervical incompetency and to rule out evidence of infection.   PAST MEDICAL HISTORY:  Type 2 diabetes, hypertension, on medications in  the past.   SURGICAL HISTORY:  Ganglion cyst years ago.   Cigarettes, alcohol,  recreational drugs denied.   ALLERGIES:  None.   MEDICATIONS:  Glimepiride 1 mg p.o. 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   SOCIAL/FAMILY HISTORY:  Positive for hypertension, diabetes.   PHYSICAL EXAM:  Height 5 feet 6 inches.  Weight 203.  Blood pressure  120/70.  Temperature 98.2.  Pulse 100.  Respirations 18.  Blood pressure  131/77.   PLAN:  IV antibiotic therapy, observe 24 hours.  If no contractions  develop, we will discuss rescue cerclage with the patient versus  continued bed rest and observation on a prolonged inpatient basis.      Jonnie Kind, M.D.  Electronically Signed     JVF/MEDQ  D:  12/24/2006  T:  12/24/2006  Job:  [id number]   cc:   [facility]

## 2011-03-25 NOTE — H&P (Signed)
[facility]  Patient:    Cindy Long, Cindy Long Visit Number: [id number] MRN: [id number]          Service Type: OUT Location: RAD Attending Physician:  Delorise Jackson Dictated by:   Irving Shows, M.D. Admit Date:  08/21/2001 Discharge Date: 08/21/2001                           History and Physical  HISTORY OF PRESENT ILLNESS:  The patient is a 35 year old female with history of recurrent abdominal pain since July.  She has crampy abdominal pain with bloating and constipation.  She has bowel movements every other day which are hard and firm.  She has not had bloody bowel movements.  She does have greasy and spicy food intolerance with increased flatus.  There is no history of vomiting.  Most of her symptoms are epigastric.  There is no hypogastric discomfort.  There is a history of peptic ulcer disease at age 15.  PAST MEDICAL HISTORY:  Diabetes mellitus, possible peptic ulcer disease.  MEDICATIONS:  Avandia, Glucophage, Librax.  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  Single, gravida 0.  She does not drink, smoke, or use drugs.  PHYSICAL EXAMINATION:  VITAL SIGNS:  Blood pressure 120/76, pulse 84, respirations 18.  Weight 173 pounds.  HEENT:  Pupils are equal and reactive to light.  Extraocular movements are intact.  Sclerae and conjunctivae are normal.  Teeth are in good repair.  Oral and nasopharynx normal.  Tympanic membranes are normal.  NECK:  Supple.  No bruit, adenopathy, or jugular venous distension.  CHEST:  Clear to auscultation.  No rales, rubs, rhonchi, or wheezes.  HEART:  Regular rate and rhythm with no murmur, gallop, or rub.  ABDOMEN:  Moderate epigastric tenderness.  No hepatosplenomegaly.  Normal bowel sounds.  EXTREMITIES:  No cyanosis, clubbing, or edema.  No joint deformity.  NEUROLOGIC:  No focal motor, sensory, or cerebellar deficit.  Cranial nerves intact.  IMPRESSION: 1. Recurrent abdominal  pain suggestive of chronic dyspepsia. 2. Diabetes mellitus. 3. Possible irritable bowel syndrome.  PLAN:  Upper endoscopy. Dictated by:   Irving Shows, M.D. Attending Physician:  Delorise Jackson DD:  08/29/01 TD:  08/29/01 Job: [id number]

## 2011-07-27 ENCOUNTER — Emergency Department (HOSPITAL_COMMUNITY)
Admission: EM | Admit: 2011-07-27 | Discharge: 2011-07-28 | Disposition: A | Discharge status: Home / Self Care | Payer: BC Managed Care – PPO | Attending: Emergency Medicine | Admitting: Emergency Medicine

## 2011-07-27 ENCOUNTER — Encounter: Payer: Self-pay | Admitting: *Deleted

## 2011-07-27 DIAGNOSIS — E119 Type 2 diabetes mellitus without complications: Secondary | ICD-10-CM | POA: Insufficient documentation

## 2011-07-27 DIAGNOSIS — Z794 Long term (current) use of insulin: Secondary | ICD-10-CM | POA: Insufficient documentation

## 2011-07-27 DIAGNOSIS — R1011 Right upper quadrant pain: Secondary | ICD-10-CM | POA: Insufficient documentation

## 2011-07-27 DIAGNOSIS — I10 Essential (primary) hypertension: Secondary | ICD-10-CM | POA: Insufficient documentation

## 2011-07-27 HISTORY — DX: Essential (primary) hypertension: I10

## 2011-07-27 LAB — CBC
HCT: 37.6 % (ref 36.0–46.0)
Hemoglobin: 12.8 g/dL (ref 12.0–15.0)
MCH: 26.1 pg (ref 26.0–34.0)
MCHC: 34 g/dL (ref 30.0–36.0)
MCV: 76.6 fL — ABNORMAL LOW (ref 78.0–100.0)
Platelets: 278 10*3/uL (ref 150–400)
RBC: 4.91 MIL/uL (ref 3.87–5.11)
RDW: 14 % (ref 11.5–15.5)
WBC: 10.3 10*3/uL (ref 4.0–10.5)

## 2011-07-27 MED ORDER — ONDANSETRON HCL 4 MG/2ML IJ SOLN
4.0000 mg | Freq: Once | INTRAMUSCULAR | Status: AC
  Administered 2011-07-27: 4 mg via INTRAVENOUS
  Filled 2011-07-27: qty 2

## 2011-07-27 MED ORDER — MORPHINE SULFATE 4 MG/ML IJ SOLN
4.0000 mg | Freq: Once | INTRAMUSCULAR | Status: AC
  Administered 2011-07-27: 4 mg via INTRAVENOUS
  Filled 2011-07-27: qty 1

## 2011-07-27 MED ORDER — SODIUM CHLORIDE 0.9 % IV SOLN
Freq: Once | INTRAVENOUS | Status: AC
  Administered 2011-07-27: 23:00:00 via INTRAVENOUS

## 2011-07-27 NOTE — ED Provider Notes (Signed)
History     CSN: [id number] Arrival date & time: 07/27/2011  9:39 PM Scribed for Veryl Speak, MD, the patient was seen in room APA11/APA11. This chart was scribed by Lyndee Hensen. This patient's care was started at 10:48PM.    Chief Complaint  Patient presents with  . Nausea  . Abdominal Pain      HPI  Cindy Long is a 35 y.o. female who presents to the Emergency Department complaining of gradual onset of moderate persistent RUQ abdominal pain that radiates to her upper back with associated diaphoresis, mild constipation that began 5:45 PM today.  Patient states that she has been nauseous for two days.  Denies fever, dysuria, chest pain and SOB. Patient last ate lunch today.  Patient was dx with DM  fifteen years ago and is compliant.    Recent glucose reading in the 120s.    Patient still has gallbladder. Hx of HTN.  LKMP 07/18/2011    PAST MEDICAL HISTORY:  Past Medical History  Diagnosis Date  . Diabetes mellitus   . Hypertension     PAST SURGICAL HISTORY:  Past Surgical History  Procedure Date  . Cesarean section   . Cervical cerclage     FAMILY HISTORY:  History reviewed. No pertinent family history.   SOCIAL HISTORY: History   Social History  . Marital Status: Single    Spouse Name: N/A    Number of Children: N/A  . Years of Education: N/A   Social History Main Topics  . Smoking status: Never Smoker   . Smokeless tobacco: None  . Alcohol Use: No  . Drug Use: No  . Sexually Active:    Other Topics Concern  . None   Social History Narrative  . None    Review of Systems 10 Systems reviewed and are negative for acute change except as noted in the HPI.  Allergies  Review of patient's allergies indicates no known allergies.  Home Medications   Current Outpatient Rx  Name Route Sig Dispense Refill  . INSULIN GLARGINE 100 UNIT/ML [place] SOLN Subcutaneous Inject 60 Units into the skin at bedtime.      . INSULIN REGULAR HUMAN 100 UNIT/ML IJ SOLN  Subcutaneous Inject 4 Units into the skin 2 (two) times daily. With lunch and with supper     . METFORMIN HCL 1000 MG PO TABS Oral Take 1,000 mg by mouth 2 (two) times daily with a meal.      . OLMESARTAN MEDOXOMIL 20 MG PO TABS Oral Take 20 mg by mouth daily.      [name] SITAGLIPTIN PHOSPHATE 100 MG PO TABS Oral Take 100 mg by mouth daily.        Physical Exam    BP 153/82  Pulse 78  Temp(Src) 98.1 F (36.7 C) (Oral)  Resp 16  Ht 5' 7"  (1.702 m)  Wt 180 lb (81.647 kg)  BMI 28.19 kg/m2  SpO2 98%  LMP 07/18/2011  Physical Exam  Nursing note and vitals reviewed. Constitutional: She is oriented to person, place, and time. She appears well-developed and well-nourished.  HENT:  Head: Normocephalic and atraumatic.  Eyes: Pupils are equal, round, and reactive to light.  Neck: Neck supple.  Cardiovascular: Normal rate, regular rhythm and normal heart sounds.   Pulmonary/Chest: Effort normal and breath sounds normal. No respiratory distress. She has no wheezes. She has no rales.  Abdominal: Soft. Bowel sounds are normal. There is tenderness. There is no rebound and no guarding.  RUQ tenderness   Musculoskeletal: Normal range of motion. She exhibits no edema.  Neurological: She is alert and oriented to person, place, and time. No sensory deficit.  Skin: Skin is warm and dry. No rash noted.  Psychiatric: She has a normal mood and affect. Her behavior is normal.    ED Course  Procedures  OTHER DATA REVIEWED: Nursing notes, vital signs, and past medical records reviewed.   DIAGNOSTIC STUDIES: Oxygen Saturation is 100% on room air, normal by my interpretation.     LABS / RADIOLOGY:  Results for orders placed during the hospital encounter of 07/27/11  CBC      Component Value Range   WBC 10.3  4.0 - 10.5 (K/uL)   RBC 4.91  3.87 - 5.11 (MIL/uL)   Hemoglobin 12.8  12.0 - 15.0 (g/dL)   HCT 37.6  36.0 - 46.0 (%)   MCV 76.6 (*) 78.0 - 100.0 (fL)   MCH 26.1  26.0 - 34.0 (pg)    MCHC 34.0  30.0 - 36.0 (g/dL)   RDW 14.0  11.5 - 15.5 (%)   Platelets 278  150 - 400 (K/uL)     No results found.    ED COURSE / COORDINATION OF CARE: 10:54 PM  Physical Exam complete.    Orders Placed This Encounter  Procedures  . CBC  . Basic metabolic panel  . Hepatic function panel  . Lipase, blood  . Urinalysis with microscopic  . Pregnancy, urine    MDM: I suspect this is biliary colic.  Will set up an [place] as an outpatient for tomorrow.   IMPRESSION: Diagnoses that have been ruled out:  Diagnoses that are still under consideration:  Final diagnoses:     MEDICATIONS GIVEN IN THE E.D. Scheduled Meds:    . sodium chloride   Intravenous Once  . morphine  4 mg Intravenous Once  . ondansetron  4 mg Intravenous Once   Continuous Infusions:     DISCHARGE MEDICATIONS: New Prescriptions   No medications on file     I personally performed the services described in this documentation, which was scribed in my presence. The recorded information has been reviewed and considered. Veryl Speak, MD           Veryl Speak, MD 07/28/11 [phone]

## 2011-07-27 NOTE — ED Notes (Signed)
Family at bedside. Patient states she can not pee at this time. She is still having pain and would like something for the pain. RN Leonette Most aware.

## 2011-07-27 NOTE — ED Notes (Signed)
Family at bedside. 

## 2011-07-27 NOTE — ED Notes (Signed)
Patient c/o nausea x 2 days, today began to have RUQ abd pain, denies vomiting

## 2011-07-28 ENCOUNTER — Ambulatory Visit (HOSPITAL_COMMUNITY)
Admit: 2011-07-28 | Discharge: 2011-07-28 | Disposition: A | Discharge status: Home / Self Care | Payer: BC Managed Care – PPO | Source: Ambulatory Visit | Attending: Emergency Medicine | Admitting: Emergency Medicine

## 2011-07-28 DIAGNOSIS — R197 Diarrhea, unspecified: Secondary | ICD-10-CM | POA: Insufficient documentation

## 2011-07-28 DIAGNOSIS — R1011 Right upper quadrant pain: Secondary | ICD-10-CM | POA: Insufficient documentation

## 2011-07-28 LAB — BASIC METABOLIC PANEL
BUN: 8 mg/dL (ref 6–23)
CO2: 24 mEq/L (ref 19–32)
Calcium: 9.6 mg/dL (ref 8.4–10.5)
Chloride: 101 mEq/L (ref 96–112)
Creatinine, Ser: 0.57 mg/dL (ref 0.50–1.10)
GFR calc Af Amer: >60 mL/min (ref >60)
GFR calc non Af Amer: >60 mL/min (ref >60)
Glucose, Bld: 192 mg/dL — ABNORMAL HIGH (ref 70–99)
Potassium: 4 mEq/L (ref 3.5–5.1)
Sodium: 135 mEq/L (ref 135–145)

## 2011-07-28 LAB — HEPATIC FUNCTION PANEL
ALT: 32 U/L (ref 0–35)
AST: 59 U/L — ABNORMAL HIGH (ref 0–37)
Albumin: 3.8 g/dL (ref 3.5–5.2)
Alkaline Phosphatase: 80 U/L (ref 39–117)
Bilirubin, Direct: 0.3 mg/dL (ref 0.0–0.3)
Indirect Bilirubin: 0.3 mg/dL (ref 0.3–0.9)
Total Bilirubin: 0.6 mg/dL (ref 0.3–1.2)
Total Protein: 7.8 g/dL (ref 6.0–8.3)

## 2011-07-28 LAB — URINALYSIS, ROUTINE W REFLEX MICROSCOPIC
Bilirubin Urine: NEGATIVE
Glucose, UA: 250 mg/dL — AB
Hgb urine dipstick: NEGATIVE
Ketones, ur: 15 mg/dL — AB
Leukocytes, UA: NEGATIVE
Nitrite: NEGATIVE
Protein, ur: NEGATIVE mg/dL
Specific Gravity, Urine: 1.025 (ref 1.005–1.030)
Urobilinogen, UA: 0.2 mg/dL (ref 0.0–1.0)
pH: 6 (ref 5.0–8.0)

## 2011-07-28 LAB — PREGNANCY, URINE: Preg Test, Ur: NEGATIVE

## 2011-07-28 LAB — LIPASE, BLOOD: Lipase: 47 U/L (ref 11–59)

## 2011-07-28 MED ORDER — HYDROCODONE-ACETAMINOPHEN 5-500 MG PO TABS: 1.0000 | ORAL_TABLET | Freq: Four times a day (QID) | ORAL | Status: AC | PRN

## 2011-07-28 NOTE — ED Provider Notes (Signed)
Pt returned for RUQ [place] today. Gallstones without evidence of cholecystitis. Discussed with pt and referred to general surgery, Dr. Geroge Baseman.   Cindy Manifold, MD 07/28/11 1330

## 2011-08-01 ENCOUNTER — Encounter (INDEPENDENT_AMBULATORY_CARE_PROVIDER_SITE_OTHER): Payer: Self-pay | Admitting: Surgery

## 2011-08-01 ENCOUNTER — Ambulatory Visit (INDEPENDENT_AMBULATORY_CARE_PROVIDER_SITE_OTHER): Payer: BC Managed Care – PPO | Admitting: Surgery

## 2011-08-01 VITALS — BP 126/80 | HR 62 | Temp 97.3°F | Resp 13 | Ht 67.0 in | Wt 183.6 lb

## 2011-08-01 DIAGNOSIS — K8020 Calculus of gallbladder without cholecystitis without obstruction: Secondary | ICD-10-CM | POA: Insufficient documentation

## 2011-08-01 NOTE — Progress Notes (Signed)
Chief Complaint  Patient presents with  . Abdominal Pain    gallstones    HPI Cindy Long is a 35 y.o. female.   HPI This is a very pleasant female referred by Dr. Iona Beard for evaluation of right-sided abdominal pain. The patient had a severe attack last week. He was described as sharp and in the right upper quadrant. There is been some nausea but no vomiting. She has also had diarrhea. She has had no previous similar attacks. The pain did not referring where else. Past Medical History  Diagnosis Date  . Diabetes mellitus   . Hypertension     Past Surgical History  Procedure Date  . Cesarean section   . Cervical cerclage     History reviewed. No pertinent family history.  Social History History  Substance Use Topics  . Smoking status: Never Smoker   . Smokeless tobacco: Not on file  . Alcohol Use: No    No Known Allergies  Current Outpatient Prescriptions  Medication Sig Dispense Refill  . HYDROcodone-acetaminophen (VICODIN) 5-500 MG per tablet Take 1-2 tablets by mouth every 6 (six) hours as needed for pain.  20 tablet  0  . insulin glargine (LANTUS) 100 UNIT/ML injection Inject 60 Units into the skin at bedtime.        . insulin regular (HUMULIN R,NOVOLIN R) 100 units/mL injection Inject 4 Units into the skin 2 (two) times daily. With lunch and with supper       . metFORMIN (GLUCOPHAGE) 1000 MG tablet Take 1,000 mg by mouth 2 (two) times daily with a meal.        . olmesartan (BENICAR) 20 MG tablet Take 20 mg by mouth daily.        . sitaGLIPtin (JANUVIA) 100 MG tablet Take 100 mg by mouth daily.          Review of Systems Review of SystemsThis 1650 and review of systems was reviewed the patient. It is significant for diabetes. It is negative from a cardiopulmonary standpoint  Blood pressure 126/80, pulse 62, temperature 97.3 F (36.3 C), temperature source Temporal, resp. rate 13, height 5' 7"  (1.702 m), weight 183 lb 9.6 oz (83.28 kg), last menstrual  period 07/18/2011.  Physical Exam Physical Exam  Constitutional: She is oriented to person, place, and time. She appears well-developed and well-nourished. No distress.  HENT:  Head: Normocephalic and atraumatic.  Right Ear: External ear normal.  Nose: Nose normal.  Mouth/Throat: Oropharynx is clear and moist. No oropharyngeal exudate.  Eyes: Conjunctivae and EOM are normal. Pupils are equal, round, and reactive to light. Left eye exhibits no discharge. No scleral icterus.  Neck: Normal range of motion. No tracheal deviation present. No thyromegaly present.  Cardiovascular: Normal rate, regular rhythm, normal heart sounds and intact distal pulses.  Exam reveals no gallop and no friction rub.   No murmur heard. Pulmonary/Chest: Effort normal and breath sounds normal. No respiratory distress. She has no wheezes.  Abdominal: Soft. Bowel sounds are normal. She exhibits no distension. There is no rebound and no guarding.  Musculoskeletal: Normal range of motion. She exhibits no edema and no tenderness.  Lymphadenopathy:    She has no cervical adenopathy.  Neurological: She is alert and oriented to person, place, and time.  Skin: Skin is warm and dry. No rash noted. She is not diaphoretic. No erythema.  Psychiatric: Her behavior is normal. Judgment and thought content normal.    Data Reviewed I have noted so the patient including  her laboratory data an ultrasound. These are from September 19. She has cholelithiasis without evidence of cholecystitis. The common bile duct is normal. Liver function tests were basically normal. Her lipase was normal. Her white blood count was also normal. Assessment    Symptomatic cholelithiasis    Plan   Laparoscopic cholecystectomy and possible cholangiogram is recommended. I discussed this with her in detail and gave her literature regarding the surgery. I discussed the risk of surgery which include but are not limited to bleeding, infection, bile duct  injury, bile leak, injury to other structures, need to convert to an open procedure, etc. She understands and wishes to proceed.        Viktorya Arguijo A 08/01/2011, 3:14 PM

## 2011-08-03 ENCOUNTER — Encounter (HOSPITAL_COMMUNITY)
Admission: RE | Admit: 2011-08-03 | Discharge: 2011-08-03 | Disposition: A | Discharge status: Home / Self Care | Payer: BC Managed Care – PPO | Source: Ambulatory Visit | Attending: Surgery | Admitting: Surgery

## 2011-08-03 ENCOUNTER — Other Ambulatory Visit (INDEPENDENT_AMBULATORY_CARE_PROVIDER_SITE_OTHER): Payer: Self-pay | Admitting: Surgery

## 2011-08-03 LAB — SURGICAL PCR SCREEN
MRSA, PCR: NEGATIVE
Staphylococcus aureus: NEGATIVE

## 2011-08-04 ENCOUNTER — Ambulatory Visit (HOSPITAL_COMMUNITY)
Admission: RE | Admit: 2011-08-04 | Discharge: 2011-08-04 | Disposition: A | Discharge status: Home / Self Care | Payer: BC Managed Care – PPO | Source: Ambulatory Visit | Attending: Surgery | Admitting: Surgery

## 2011-08-04 ENCOUNTER — Other Ambulatory Visit (INDEPENDENT_AMBULATORY_CARE_PROVIDER_SITE_OTHER): Payer: Self-pay | Admitting: Surgery

## 2011-08-04 DIAGNOSIS — I10 Essential (primary) hypertension: Secondary | ICD-10-CM | POA: Insufficient documentation

## 2011-08-04 DIAGNOSIS — K219 Gastro-esophageal reflux disease without esophagitis: Secondary | ICD-10-CM | POA: Insufficient documentation

## 2011-08-04 DIAGNOSIS — E119 Type 2 diabetes mellitus without complications: Secondary | ICD-10-CM | POA: Insufficient documentation

## 2011-08-04 DIAGNOSIS — K8010 Calculus of gallbladder with chronic cholecystitis without obstruction: Secondary | ICD-10-CM

## 2011-08-04 DIAGNOSIS — D573 Sickle-cell trait: Secondary | ICD-10-CM | POA: Insufficient documentation

## 2011-08-04 DIAGNOSIS — Z01818 Encounter for other preprocedural examination: Secondary | ICD-10-CM | POA: Insufficient documentation

## 2011-08-04 DIAGNOSIS — Z01810 Encounter for preprocedural cardiovascular examination: Secondary | ICD-10-CM | POA: Insufficient documentation

## 2011-08-04 DIAGNOSIS — Z01812 Encounter for preprocedural laboratory examination: Secondary | ICD-10-CM | POA: Insufficient documentation

## 2011-08-04 DIAGNOSIS — K8020 Calculus of gallbladder without cholecystitis without obstruction: Secondary | ICD-10-CM | POA: Insufficient documentation

## 2011-08-04 LAB — GLUCOSE, CAPILLARY
Glucose-Capillary: 177 mg/dL — ABNORMAL HIGH (ref 70–99)
Glucose-Capillary: 167 mg/dL — ABNORMAL HIGH (ref 70–99)
Glucose-Capillary: 166 mg/dL — ABNORMAL HIGH (ref 70–99)
Glucose-Capillary: 152 mg/dL — ABNORMAL HIGH (ref 70–99)

## 2011-08-08 HISTORY — PX: CHOLECYSTECTOMY OPEN: SUR202

## 2011-08-08 NOTE — Op Note (Signed)
  Cindy Long, Cindy Long            ACCOUNT NO.:  [id number]  MEDICAL RECORD NO.:  [id number]  LOCATION:  [facility]                         FACILITY:  [facility]  PHYSICIAN:  Coralie Keens, M.D. DATE OF BIRTH:  1976-01-28  DATE OF PROCEDURE:  08/04/2011 DATE OF DISCHARGE:                              OPERATIVE REPORT   PREOPERATIVE DIAGNOSIS:  Symptomatic cholelithiasis.  POSTOPERATIVE DIAGNOSIS:  Symptomatic cholelithiasis.  PROCEDURE:  Laparoscopic cholecystectomy.  SURGEON:  Coralie Keens, MD  ANESTHESIA:  General and 0.25% Marcaine with epinephrine.  ESTIMATED BLOOD LOSS:  Minimal.  FINDINGS:  The patient was found to have a chronically scarred-appearing gallbladder with gallstones.  PROCEDURE IN DETAIL:  The patient was brought to the operating room, identified as [employer].  She was placed supine on the operative table and general anesthesia was induced.  Her abdomen was then prepped and draped in usual sterile fashion.  Using a #15-blade, the patient's previous transverse scar just above the umbilicus was excised.  I then took this down the fascia, which was then opened with a scalpel.  A hemostat was used to pass the peritoneal cavity under direct vision.  0- Vicryl pursestring suture was then placed around the fascial opening. The Hasson port was placed through the opening and insufflation of the abdomen was begun.  I then placed a 5-mm port in the patient's epigastrium and two more in the right upper quadrant, all under direct vision.  The gallbladder was then grasped and retracted above the liver bed.  Dissection was then carried down to the base of the gallbladder. The cystic duct was easily dissected out and a critical window was achieved around it.  It was clipped three times proximally, once distally, and transected.  The cystic artery was then identified, clipped proximally, distally, and transected as well.  The gallbladder was then slowly dissected  free from the liver bed with electrocautery. Once it was free from liver bed, it was placed in an Endosac and removed with the incision at the umbilicus.  I then again evaluated the liver bed, hemostasis appeared to be achieved with cautery.  I then thoroughly irrigated the abdomen with normal saline.  All ports were removed under direct vision.  The abdomen was deflated.  0-Vicryl in the umbilicus was then tied in place closing the fascial defect.  All incisions were anesthetized with Marcaine and closed with 4-0 Monocryl subcuticular sutures.  Steri-Strips and Band-Aids were applied.  The patient tolerated the procedure well.  All counts were correct at the end of procedure.  The patient was then extubated in the operating room and taken in a stable condition to recovery room.     Coralie Keens, M.D.     DB/MEDQ  D:  08/04/2011  T:  08/04/2011  Job:  [id number]  Electronically Signed by Coralie Keens M.D. on 08/08/2011 09:11:14 AM

## 2011-08-10 ENCOUNTER — Telehealth (INDEPENDENT_AMBULATORY_CARE_PROVIDER_SITE_OTHER): Payer: Self-pay

## 2011-08-10 ENCOUNTER — Encounter (INDEPENDENT_AMBULATORY_CARE_PROVIDER_SITE_OTHER): Payer: Self-pay | Admitting: Surgery

## 2011-08-10 NOTE — Telephone Encounter (Addendum)
Pt called today c/o severe cramping pains in her left abdomen.  She is one week post op lap chole.  I explained that she may be experiencing some gas pains from the laparoscopic procedure.  She is having some diarrhea, but no constipation. I advised her to drink plenty of fluids and move around to help expel some of the gas.  This should resolve in the next several days.  She understood and will call back if needed.

## 2011-08-16 LAB — OVA AND PARASITE EXAMINATION

## 2011-08-16 LAB — URINALYSIS, ROUTINE W REFLEX MICROSCOPIC
Bilirubin Urine: NEGATIVE
Glucose, UA: 500 — AB
Hgb urine dipstick: NEGATIVE
Ketones, ur: NEGATIVE
Nitrite: NEGATIVE
Protein, ur: NEGATIVE
Specific Gravity, Urine: 1.015
Urobilinogen, UA: 0.2
pH: 6

## 2011-08-16 LAB — CLOSTRIDIUM DIFFICILE EIA: C difficile Toxins A+B, EIA: NEGATIVE

## 2011-08-16 LAB — FECAL LACTOFERRIN, QUANT: Fecal Lactoferrin: NEGATIVE

## 2011-08-16 LAB — COMPREHENSIVE METABOLIC PANEL
ALT: 18
AST: 22
Albumin: 3.5
Alkaline Phosphatase: 80
BUN: 8
CO2: 22
Calcium: 9.2
Chloride: 106
Creatinine, Ser: 0.78
GFR calc Af Amer: >60
GFR calc non Af Amer: >60
Glucose, Bld: 299 — ABNORMAL HIGH
Potassium: 3.5
Sodium: 136
Total Bilirubin: 0.6
Total Protein: 6.8

## 2011-08-16 LAB — CBC
HCT: 40
Hemoglobin: 13.4
MCHC: 33.6
MCV: 79.3
Platelets: 274
RBC: 5.04
RDW: 13.7
WBC: 6.7

## 2011-08-16 LAB — DIFFERENTIAL
Basophils Absolute: 0
Basophils Relative: 0
Eosinophils Absolute: 0.1 — ABNORMAL LOW
Eosinophils Relative: 1
Lymphocytes Relative: 26
Lymphs Abs: 1.7
Monocytes Absolute: 0.3
Monocytes Relative: 4
Neutro Abs: 4.6
Neutrophils Relative %: 69

## 2011-08-16 LAB — STOOL CULTURE

## 2011-08-16 LAB — LIPASE, BLOOD: Lipase: 28

## 2011-08-16 LAB — PREGNANCY, URINE: Preg Test, Ur: NEGATIVE

## 2011-08-22 ENCOUNTER — Ambulatory Visit (INDEPENDENT_AMBULATORY_CARE_PROVIDER_SITE_OTHER): Payer: BC Managed Care – PPO | Admitting: General Surgery

## 2011-08-22 ENCOUNTER — Encounter (INDEPENDENT_AMBULATORY_CARE_PROVIDER_SITE_OTHER): Payer: Self-pay | Admitting: Surgery

## 2011-08-22 ENCOUNTER — Ambulatory Visit (INDEPENDENT_AMBULATORY_CARE_PROVIDER_SITE_OTHER): Payer: BC Managed Care – PPO | Admitting: Surgery

## 2011-08-22 VITALS — BP 116/86 | HR 70 | Temp 97.4°F | Resp 18 | Ht 67.0 in | Wt 176.0 lb

## 2011-08-22 DIAGNOSIS — Z09 Encounter for follow-up examination after completed treatment for conditions other than malignant neoplasm: Secondary | ICD-10-CM

## 2011-08-22 NOTE — Progress Notes (Signed)
Subjective:     Patient ID: Cindy Long, female   DOB: 02-Jun-1976, 35 y.o.   MRN: [id number]  HPI  She is here for her first postoperative visit status post left upper cholecystectomy performed on September 27. She is doing well except for some diarrhea bowel movements after meals. She is otherwise eating well Review of Systems     Objective:   Physical Exam    On exam, her abdomen is soft and her incisions are well-healed. The final pathology showed chronic inflammation and gallstones Assessment:     Patient status post laparoscopic cholecystectomy    Plan:     She will return to work this Wednesday. I will see her back as needed. She will call if her loose bowel movements persist

## 2011-08-22 NOTE — Patient Instructions (Signed)
Call if loose BM's persist

## 2011-08-23 LAB — COMPREHENSIVE METABOLIC PANEL
ALT: 13
AST: 22
Albumin: 1.8 — ABNORMAL LOW
Alkaline Phosphatase: 102
BUN: 1 — ABNORMAL LOW
CO2: 22
Calcium: 8.2 — ABNORMAL LOW
Chloride: 107
Creatinine, Ser: 0.82
GFR calc Af Amer: >60
GFR calc non Af Amer: >60
Glucose, Bld: 105 — ABNORMAL HIGH
Potassium: 3.7
Sodium: 138
Total Bilirubin: 1.1
Total Protein: 4.1 — ABNORMAL LOW

## 2011-08-23 LAB — CBC
HCT: 38
HCT: 43
Hemoglobin: 12.4
Hemoglobin: 13.9
MCHC: 32.3
MCHC: 32.7
MCV: 82.5
MCV: 82.7
Platelets: 201
Platelets: 218
RBC: 4.6
RBC: 5.22 — ABNORMAL HIGH
RDW: 16.8 — ABNORMAL HIGH
RDW: 17 — ABNORMAL HIGH
WBC: 10.6 — ABNORMAL HIGH
WBC: 7.6

## 2011-08-23 LAB — CCBB MATERNAL DONOR DRAW

## 2011-08-23 LAB — RPR: RPR Ser Ql: NONREACTIVE

## 2011-12-09 ENCOUNTER — Telehealth (INDEPENDENT_AMBULATORY_CARE_PROVIDER_SITE_OTHER): Payer: Self-pay | Admitting: Surgery

## 2011-12-09 NOTE — Telephone Encounter (Signed)
Cindy Long, an employee of Dr. Criss Rosales, called and states that Dr. Alvina Filbert like for her to be seen sooner, please call.

## 2011-12-12 ENCOUNTER — Ambulatory Visit (INDEPENDENT_AMBULATORY_CARE_PROVIDER_SITE_OTHER): Payer: BC Managed Care – PPO | Admitting: Surgery

## 2011-12-12 ENCOUNTER — Encounter (INDEPENDENT_AMBULATORY_CARE_PROVIDER_SITE_OTHER): Payer: Self-pay | Admitting: Surgery

## 2011-12-12 VITALS — BP 150/106 | HR 92 | Temp 97.4°F | Resp 18 | Ht 66.0 in | Wt 179.2 lb

## 2011-12-12 DIAGNOSIS — N61 Mastitis without abscess: Secondary | ICD-10-CM | POA: Insufficient documentation

## 2011-12-12 NOTE — Progress Notes (Signed)
Subjective:     Patient ID: Cindy Long, female   DOB: [birth year], 36 y.o.   MRN: [id number]  HPI She is referred by Dr. Criss Rosales because of right breast mastitis. She is diabetic. She started having significant pain in her right breast last Wednesday. Apparently it had developed significant erythema. She denied drainage from her nipple. She was placed on Cipro by Dr. Criss Rosales. Today she reports that it improved greatly over the weekend. She now has minimal discomfort.  Review of Systems     Objective:   Physical Exam On exam, her right breast is soft. There is no erythema. There is minimal tenderness specifically in the area where the erythema had developed. I can palpate no masses. There is no nipple discharge. There is minimal shoddy adenopathy in the right axilla    Assessment:     Resolving right breast mastitis    Plan:     Apparently, the ultrasound did not show a fluid collection or abscess. I am waiting the final report. At this point, with her improving, I do not think a mammogram as necessary. She will finish the course of Cipro. Should the area flareup again, I will change her antibiotics. She will come back as soon as possible should redevelop erythema. If not I will see her as needed

## 2011-12-13 ENCOUNTER — Encounter (INDEPENDENT_AMBULATORY_CARE_PROVIDER_SITE_OTHER): Payer: Self-pay

## 2011-12-15 ENCOUNTER — Encounter: Payer: Self-pay | Admitting: *Deleted

## 2011-12-15 ENCOUNTER — Encounter: Payer: BC Managed Care – PPO | Attending: Obstetrics and Gynecology | Admitting: *Deleted

## 2011-12-15 DIAGNOSIS — Z713 Dietary counseling and surveillance: Secondary | ICD-10-CM | POA: Insufficient documentation

## 2011-12-15 DIAGNOSIS — E669 Obesity, unspecified: Secondary | ICD-10-CM | POA: Insufficient documentation

## 2011-12-15 DIAGNOSIS — E119 Type 2 diabetes mellitus without complications: Secondary | ICD-10-CM | POA: Insufficient documentation

## 2011-12-15 NOTE — Progress Notes (Signed)
  Medical Nutrition Therapy:  Appt start time: 1630 end time:  1530.   Assessment:  Primary concerns today: Patient here for nutrition counseling and diabetes education for obesity and better BG control prior to conception. She states she has had diabetes for about 15 years and doesn't recall ever having any formal diabetes education training. She has a 34 year old daughter and would like to have another child, but wants to get diabetes under better control before conceiving.  MEDICATIONS: see list. Diabetes medicaitons are Lantus 66 units at bedtime and Novolog 4 units pre meal plus sliding scale to correct highs. Also on Januvia and Gliburide.   DIETARY INTAKE:  Usual eating pattern includes 2 meals and 1-2 snacks per day.  Everyday foods include fair variety of all food groups except milk.  Avoided foods include milk, .    24-hr recall:  B ( AM): skip  Snk ( AM): occasionally chips  L ( PM): eat lunch at work, brought in by reps Snk ( PM): none D ( PM): self cooks, eat out often; sit down restaurant Snk ( PM): yogurt or apple, occasionally a sandwich Beverages: sweet/unsweet tea mix, water,   Usual physical activity: none other than at work,   Estimated energy needs: 1400 calories 158 g carbohydrates 105 g protein 39 g fat  Progress Towards Goal(s):  In progress.   Nutritional Diagnosis:  NI-1.5 Excessive energy intake As related to weight control.  As evidenced by BMI of 28.9 . Also Carb consistency to improve BG management based on last A1c of 7.4 which is another improvement from 7.8 and higher within the past year.    Intervention:  Nutrition counseling and diabetes education provided. Discusses basic physiology of diabetes, value of self monitoring of BG and using results to manage BG better, Carb Counting and reading food labels. Also introduced patient to insulin pump therapy, pros and cons in terms of more accurate diabetes management as well as more flexible life style.  Provided web site information so she can follow up on her own if she continues to be interested. Plan: Aim for 3 Carb Choices (45 grams) per meal, 0-1 Carb per snack if hungry Read food labels for total carb and fat grams of foods eaten at home Continue to test BG as directed by MD, consider pre and post meal occasionally Check website: [employer]onicdiabetes.com for insulin pump information  Handouts given during visit include:  Living Well with Diabetes  Carb Counting and Reading Food Labels handouts  Meal Planning Card  BG Log Sheet  Monitoring/Evaluation:  Dietary intake, exercise, self monitoring of BG, and body weight prn.

## 2011-12-15 NOTE — Patient Instructions (Addendum)
Plan: Aim for 3 Carb Choices (45 grams) per meal, 0-1 Carb per snack if hungry Read food labels for total carb and fat grams of foods eaten at home Continue to test BG as directed by MD, consider pre and post meal occasionally Check website: [employer]onicdiabetes.com for insulin pump information

## 2011-12-19 ENCOUNTER — Encounter (INDEPENDENT_AMBULATORY_CARE_PROVIDER_SITE_OTHER): Payer: BC Managed Care – PPO | Admitting: Surgery

## 2011-12-26 ENCOUNTER — Encounter (INDEPENDENT_AMBULATORY_CARE_PROVIDER_SITE_OTHER): Payer: BC Managed Care – PPO | Admitting: Surgery

## 2012-03-06 ENCOUNTER — Encounter: Payer: Self-pay | Admitting: Obstetrics and Gynecology

## 2012-06-18 DIAGNOSIS — R102 Pelvic and perineal pain: Secondary | ICD-10-CM | POA: Insufficient documentation

## 2012-06-18 DIAGNOSIS — N939 Abnormal uterine and vaginal bleeding, unspecified: Secondary | ICD-10-CM | POA: Insufficient documentation

## 2012-06-18 DIAGNOSIS — E119 Type 2 diabetes mellitus without complications: Secondary | ICD-10-CM | POA: Insufficient documentation

## 2012-09-22 ENCOUNTER — Emergency Department (HOSPITAL_COMMUNITY)
Admission: EM | Admit: 2012-09-22 | Discharge: 2012-09-23 | Disposition: A | Discharge status: Home / Self Care | Payer: BC Managed Care – PPO | Attending: Emergency Medicine | Admitting: Emergency Medicine

## 2012-09-22 ENCOUNTER — Encounter (HOSPITAL_COMMUNITY): Payer: Self-pay | Admitting: Emergency Medicine

## 2012-09-22 DIAGNOSIS — Z79899 Other long term (current) drug therapy: Secondary | ICD-10-CM | POA: Insufficient documentation

## 2012-09-22 DIAGNOSIS — Z3202 Encounter for pregnancy test, result negative: Secondary | ICD-10-CM | POA: Insufficient documentation

## 2012-09-22 DIAGNOSIS — I10 Essential (primary) hypertension: Secondary | ICD-10-CM | POA: Insufficient documentation

## 2012-09-22 DIAGNOSIS — R0789 Other chest pain: Secondary | ICD-10-CM

## 2012-09-22 DIAGNOSIS — Z794 Long term (current) use of insulin: Secondary | ICD-10-CM | POA: Insufficient documentation

## 2012-09-22 DIAGNOSIS — N61 Mastitis without abscess: Secondary | ICD-10-CM | POA: Insufficient documentation

## 2012-09-22 DIAGNOSIS — E119 Type 2 diabetes mellitus without complications: Secondary | ICD-10-CM | POA: Insufficient documentation

## 2012-09-22 DIAGNOSIS — R071 Chest pain on breathing: Secondary | ICD-10-CM | POA: Insufficient documentation

## 2012-09-22 MED ORDER — IBUPROFEN 800 MG PO TABS
800.0000 mg | ORAL_TABLET | Freq: Once | ORAL | Status: AC
  Administered 2012-09-23: 800 mg via ORAL
  Filled 2012-09-22: qty 1

## 2012-09-22 NOTE — ED Provider Notes (Signed)
History     CSN: [id number]  Arrival date & time 09/22/12  2323   First MD Initiated Contact with Patient 09/22/12 2343      Chief Complaint  Patient presents with  . Chest Pain    (Consider location/radiation/quality/duration/timing/severity/associated sxs/prior treatment) HPI  Cindy Long is a 36 y.o. female who presents to the Emergency Department complaining of left sided chest aching that began an hour ago. Not aware of any strenuous activity or unusual activity. Has taken no medicines. Pain worse with breathing and movement.   PCP Dr. Loleta Chance   Past Medical History  Diagnosis Date  . Diabetes mellitus   . Hypertension   . Mastitis     right breast    Past Surgical History  Procedure Date  . Cesarean section   . Cervical cerclage   . Cholecystectomy   . Laparoscopic endometriosis fulguration 01/2011    Family History  Problem Relation Age of Onset  . Diabetes Maternal Aunt   . Cancer Maternal Grandmother     breast, colon  . Diabetes Maternal Grandmother     History  Substance Use Topics  . Smoking status: Never Smoker   . Smokeless tobacco: Not on file  . Alcohol Use: No    OB History    Grav Para Term Preterm Abortions TAB SAB Ect Mult Living                  Review of Systems  Constitutional: Negative for fever.       10 Systems reviewed and are negative for acute change except as noted in the HPI.  HENT: Negative for congestion.   Eyes: Negative for discharge and redness.  Respiratory: Negative for cough and shortness of breath.        Chest discomfort  Cardiovascular: Negative for chest pain.  Gastrointestinal: Negative for vomiting and abdominal pain.  Musculoskeletal: Negative for back pain.  Skin: Negative for rash.  Neurological: Negative for syncope, numbness and headaches.  Psychiatric/Behavioral:       No behavior change.    Allergies  Shellfish allergy  Home Medications   Current Outpatient Rx  Name  Route  Sig   Dispense  Refill  . CIPROFLOXACIN HCL 500 MG PO TABS      BID times 48H.         . INSULIN GLARGINE 100 UNIT/ML Lennon SOLN   Subcutaneous   Inject 66 Units into the skin at bedtime.          . INSULIN REGULAR HUMAN 100 UNIT/ML IJ SOLN   Subcutaneous   Inject 4 Units into the skin 2 (two) times daily. With lunch and with supper          . METFORMIN HCL 1000 MG PO TABS   Oral   Take 1,000 mg by mouth 2 (two) times daily with a meal.           . NIFEDIPINE ER OSMOTIC 30 MG PO TB24      daily.          . LO LOESTRIN FE PO   Oral   Take by mouth.         . ROSUVASTATIN CALCIUM 5 MG PO TABS   Oral   Take 5 mg by mouth daily.         [name] SITAGLIPTIN PHOSPHATE 100 MG PO TABS   Oral   Take 100 mg by mouth daily.  BP 155/90  Pulse 90  Temp 98.2 F (36.8 C) (Oral)  Resp 20  Ht 5\' 6"  (1.676 m)  Wt 180 lb (81.647 kg)  BMI 29.05 kg/m2  SpO2 99%  LMP 08/13/2012  Physical Exam  Nursing note and vitals reviewed. Constitutional:       Awake, alert, nontoxic appearance.  HENT:  Head: Atraumatic.  Eyes: Right eye exhibits no discharge. Left eye exhibits no discharge.  Neck: Neck supple.  Pulmonary/Chest: Effort normal. She exhibits no tenderness.       Left sided chest wall discomfort with palpation  Abdominal: Soft. There is no tenderness. There is no rebound.  Musculoskeletal: She exhibits no tenderness.       Baseline ROM, no obvious new focal weakness.  Neurological:       Mental status and motor strength appears baseline for patient and situation.  Skin: No rash noted.  Psychiatric: She has a normal mood and affect.    ED Course  Procedures (including critical care time)  Results for orders placed during the hospital encounter of 09/22/12  POCT PREGNANCY, URINE      Component Value Range   Preg Test, Ur NEGATIVE  NEGATIVE     Date: 09/22/2012   2333  Rate: 89  Rhythm: normal sinus rhythm  QRS Axis: normal  Intervals: normal  ST/T  Wave abnormalities: normal  Conduction Disutrbances: none  Narrative Interpretation: unremarkable  Dg Chest 2 View  09/23/2012  *RADIOLOGY REPORT*  Clinical Data: ER patient.  Mid chest pain.  CHEST - 2 VIEW  Comparison: 08/03/2011  Findings: Normal heart size with clear lung fields.  No bony abnormality.  No change from priors.  IMPRESSION: No active cardiopulmonary disease.   Original Report Authenticated By: Davonna Belling, M.D.       MDM  Patient with left sided chest aching that began an hour prior to arrival. EKG is unremarkable, xray is normal. Given ibuprofen with relief.  Pt feels improved after observation and/or treatment in ED.Pt stable in ED with no significant deterioration in condition.The patient appears reasonably screened and/or stabilized for discharge and I doubt any other medical condition or other [facility] requiring further screening, evaluation, or treatment in the ED at this time prior to discharge.  MDM Reviewed: nursing note and vitals Interpretation: x-ray and labs           [facility]. Colon Branch, MD 09/23/12 [id number]

## 2012-09-22 NOTE — ED Notes (Signed)
Pt c/o constant aching chest pain on the left side. Denies any radiating pain.  Also c/o some SOB, headache but denies blurred vision, N/V. States the pain began an hour ago.

## 2012-09-23 ENCOUNTER — Emergency Department (HOSPITAL_COMMUNITY): Payer: BC Managed Care – PPO

## 2012-09-23 LAB — POCT PREGNANCY, URINE: Preg Test, Ur: NEGATIVE

## 2012-09-23 MED ORDER — IBUPROFEN 800 MG PO TABS: 800.0000 mg | ORAL_TABLET | Freq: Three times a day (TID) | ORAL | Status: DC

## 2013-02-05 ENCOUNTER — Encounter: Payer: Self-pay | Admitting: Internal Medicine

## 2013-02-05 ENCOUNTER — Ambulatory Visit (INDEPENDENT_AMBULATORY_CARE_PROVIDER_SITE_OTHER): Payer: BC Managed Care – PPO | Admitting: Internal Medicine

## 2013-02-05 VITALS — BP 134/88 | HR 89 | Temp 98.2°F | Resp 10 | Ht 67.0 in | Wt 174.0 lb

## 2013-02-05 DIAGNOSIS — K589 Irritable bowel syndrome without diarrhea: Secondary | ICD-10-CM | POA: Insufficient documentation

## 2013-02-05 DIAGNOSIS — E785 Hyperlipidemia, unspecified: Secondary | ICD-10-CM | POA: Insufficient documentation

## 2013-02-05 DIAGNOSIS — E119 Type 2 diabetes mellitus without complications: Secondary | ICD-10-CM

## 2013-02-05 MED ORDER — INSULIN GLARGINE 100 UNIT/ML ~~LOC~~ SOLN: 20.0000 [IU] | Freq: Every day | SUBCUTANEOUS | Status: DC

## 2013-02-05 NOTE — Progress Notes (Signed)
Subjective:     Patient ID: Cindy Long, female   DOB: 03/04/1976, 37 y.o.   MRN: [id number]  HPI Cindy Long is a pleasant 37 year old woman, self-referred for management of DM2, insulin-dependent, uncontrolled, without complications.  She was dx with DM2 in 1999. She has a FH of DM2 in MGM and aunt. She has started on Insulin 2008 when she was pregnant with her daughter.   Last HbA1C 8.4% on 01/23/2013, previously in the 7's. She is on: - Metformin 1000 mg bid >> diarrhea, once or twice a day, got better >> she is compliant with this - Januvia 100 mg added 2009 >> she is compliant with this - Lantus 66 units at bedtime >> misses doses, have not taking it in the last 3 days (takes it approx. 2x a week) - she keeps the pen in the fridge when after opening - Regular insulin SSI, target 150-200: + 4 units, 201-250: + 8 units >> upon questioning, not taking  Checks her sugars once a day: - am: 117-120 - if she checks 1h dinner: 200s She has drenching sweats every time after taking Lantus, usually at night, and that is why she does not take it consistently. She has hypoglycemia awareness, in the 70-80's. Highest sugar in last 6 mo: 254 (bedtime).   She usually eats only peanuts for breakfast, a catered lunch at work, dinner depending on what she has in the house. She eats maybe one snack per day if that.  No eye ds., last eye exam last year. No numbness/tingling in legs, no CKD.  She is currently undergoing investigation for infertility; fact that was revealed towards the end of the appointment. She was previously on Depo-Provera, and she had irregular menstrual cycles for a few months, now they are coming every month.   Reviewed patient's lab records per letter from PCP that she brings at this appointment. Last LFTs normal this month (01/23/13), TSH normal, lipid panel (fasting): 150/87/38/95.   Review of Systems Constitutional: + both weight gain/loss, + fatigue, poor sleep Eyes: no  blurry vision, no xerophthalmia ENT: no sore throat, no nodules palpated in throat, no dysphagia/odynophagia, no hoarseness, hypoacusis R ear Cardiovascular: + CP/noSOB/+ palpitations/leg swelling Respiratory: + cough/SOB Gastrointestinal: no N/V/+ D/+ C (IBS) Musculoskeletal: no muscle/joint aches Skin: no rashes, excessive hair growth Neurological: no tremors/numbness/tingling/dizziness Psychiatric: no depression/anxiety  Past Medical History  Diagnosis Date  . Diabetes mellitus   . Hypertension   . Mastitis     right breast   Past Surgical History  Procedure Laterality Date  . Cesarean section    . Cervical cerclage    . Cholecystectomy    . Laparoscopic endometriosis fulguration  01/2011  . Cholecystectomy open  08/2011   History   Social History  . Marital Status: Married    Spouse Name: N/A    Number of Children: 1   Occupational History  . RN   Social History Main Topics  . Smoking status: Never Smoker   . Smokeless tobacco: Not on file  . Alcohol Use: No  . Drug Use: No  . Sexually Active:    Current Outpatient Prescriptions on File Prior to Visit  Medication Sig Dispense Refill  . sitaGLIPtin (JANUVIA) 100 MG tablet Take 100 mg by mouth daily.        . ciprofloxacin (CIPRO) 500 MG tablet BID times 48H.      [name] ibuprofen (ADVIL,MOTRIN) 800 MG tablet Take 1 tablet (800 mg total) by mouth 3 (three)  times daily.  21 tablet  0  . insulin glargine (LANTUS) 100 UNIT/ML injection Inject 66 Units into the skin at bedtime.       . insulin regular (HUMULIN R,NOVOLIN R) 100 units/mL injection Inject 4 Units into the skin 2 (two) times daily. With lunch and with supper       . metFORMIN (GLUCOPHAGE) 1000 MG tablet Take 1,000 mg by mouth 2 (two) times daily with a meal.        . NIFEdipine (PROCARDIA XL/ADALAT-CC) 30 MG 24 hr tablet daily.       Cindy Long Estrad-Fe Biphas (LO LOESTRIN FE PO) Take by mouth.      . rosuvastatin (CRESTOR) 5 MG tablet Take 5 mg by mouth  daily.       No current facility-administered medications on file prior to visit.   Allergies  Allergen Reactions  . Shellfish Allergy Swelling    Swelling of the throat   Family History  Problem Relation Age of Onset  . Diabetes Maternal Aunt   . Cancer Maternal Grandmother     breast, colon  . Diabetes Maternal Grandmother    Objective:   Physical Exam BP 134/88  Pulse 89  Temp(Src) 98.2 F (36.8 C) (Oral)  Resp 10  Ht 5\' 7"  (1.702 m)  Wt 174 lb (78.926 kg)  BMI 27.25 kg/m2  SpO2 97%  LMP 02/02/2013 Wt Readings from Last 3 Encounters:  02/05/13 174 lb (78.926 kg)  09/22/12 180 lb (81.647 kg)  12/15/11 178 lb 11.2 oz (81.058 kg)   Constitutional: overweight, in NAD Eyes: PERRLA, EOMI, + bilateral exophthalmos, no lid lag, no stare ENT: moist mucous membranes, no thyromegaly, no cervical lymphadenopathy Cardiovascular: RRR, No MRG Respiratory: CTA B Gastrointestinal: abdomen soft, NT, ND, BS+ Musculoskeletal: no deformities, strength intact in all 4 Skin: moist, warm, no rashes Neurological: no tremor with outstretched hands, DTR normal in all 4  Assessment:     1. DM 2, insulin-dependent, uncontrolled, without complications     Plan:     Patient with long-standing diabetes, with increasing hemoglobin A1c per the last check by PCP, with medication noncompliance, mostly because of overdosing of Lantus. Whenever she takes a 66 units, she has hypoglycemia overnight. - We discussed about the importance of taking Lantus every night, but I agree, 66 units is a fairly high dose for her, and we discussed about starting it at 20 units at night - I advised her to keep the insulin pen on her nightstand (she has been keeping it in the fridge), since this is more likely to increase her compliance - I also advised her to continue metformin and Januvia for now - since she is not taking the regular sliding scale, I will take it off her medication list - Towards the end of the  appointment, I noticed on her initial intake form that she wrote that she would like to become pregnant. She is seeing Dr. Maxie Better with OB/GYN, who is working her up for infertility, as she has been trying to get pregnant for the last year. She was investigated for endometriosis. - We discussed about the fact that she needs to start taking the Lantus consistently, and I believe we can continue metformin and Januvia for now, but I let her know that she should call me immediately that she gets pregnant and at that time we might need to switch to mealtime insulin. Neither metformin nor Januvia are contraindicated in pregnancy. We did discuss about the fact  that she cannot take Crestor or ACE inhibitors during pregnancy. She already stopped Crestor but she is still on lisinopril, which will need to be stopped as soon as she becomes pregnant. I believe nifedipine is also on the contraindicated drug list. - she will need to have a new eye exam before getting pregnant - given sugar log and advised how to fill it and to bring it at next appt - She will need to start checking her sugars at least 2-3 times a day, before meals and at bedtime - given foot care handout and explained the principles - given instructions for hypoglycemia management "15-15 rule" - return to clinic in one month with a sugar log - Advised her to join my chart  CC:  Maxie Better M.D.  OB/GYN [address] [place], [place] [address] Telephone [phone]  Mirna Mires, M.D. [phone] [address] [place], [place] [address] Telephone [phone] Fax [phone]

## 2013-02-05 NOTE — Patient Instructions (Signed)
Please decrease Lantus to 20 units and take it every night.  Continue Metformin and Januvia. Return in 1 month with your sugar log. Check your sugars at least 2-3 times daily, in am and before meals and at bedtime. Please call me immediately when you become pregnant. Please join MyChart.  Patient instructions for Diabetes mellitus type 2:  DIET AND EXERCISE Diet and exercise is an important part of diabetic treatment.  We recommended aerobic exercise in the form of brisk walking (working between 40-60% of maximal aerobic capacity, similar to brisk walking) for 150 minutes per week (such as 30 minutes five days per week) along with 3 times per week performing 'resistance' training (using various gauge rubber tubes with handles) 5-10 exercises involving the major muscle groups (upper body, lower body and core) performing 10-15 repetitions (or near fatigue) each exercise. Start at half the above goal but build slowly to reach the above goals. If limited by weight, joint pain, or disability, we recommend daily walking in a swimming pool with water up to waist to reduce pressure from joints while allow for adequate exercise.    BLOOD GLUCOSES Monitoring your blood glucoses is important for continued management of your diabetes. Please check your blood glucoses 3-4 times a day: fasting, before meals and at bedtime (you can rotate these measurements - e.g. one day check before the 3 meals, the next day check before 2 of the meals and before bedtime, etc.   HYPOGLYCEMIA (low blood sugar) Hypoglycemia is usually a reaction to not eating, exercising, or taking too much insulin/ other diabetes drugs.  Symptoms include tremors, sweating, hunger, confusion, headache, etc. Treat IMMEDIATELY with 15 grams of Carbs:   4 glucose tablets    cup regular juice/soda   2 tablespoons raisins   4 teaspoons sugar   1 tablespoon honey Recheck blood glucose in 15 mins and repeat above if still symptomatic/blood  glucose <100. Please contact our office at [phone] if you have questions about how to next handle your insulin.  RECOMMENDATIONS TO REDUCE YOUR RISK OF DIABETIC COMPLICATIONS: * Take your prescribed MEDICATION(S). * Follow a DIABETIC diet: Complex carbs, fiber rich foods, heart healthy fish twice weekly, (monounsaturated and polyunsaturated) fats * AVOID saturated/trans fats, high fat foods, >2,300 mg salt per day. * EXERCISE at least 5 times a week for 30 minutes or preferably daily.  * DO NOT SMOKE OR DRINK more than 1 drink a day. * Check your FEET every day. Do not wear tightfitting shoes. Contact us if you develop an ulcer * See your EYE doctor once a year or more if needed * Get a FLU shot once a year * Get a PNEUMONIA vaccine every 5 years and once after age 69 years  GOALS:  * Your Hemoglobin A1c of 7%  * Your Systolic BP should be 140 or lower  * Your Diastolic BP should be 80 or lower  * Your HDL (Good Cholesterol) should be 40 or higher  * Your LDL (Bad Cholesterol) should be 100 or lower  * Your Triglycerides should be 150 or lower  * Your Urine microalbumin (kidney function) of <30 * Your Body Mass Index should be 25 or lower  We will be glad to help you achieve these goals. Our telephone number is: [phone].

## 2013-03-12 ENCOUNTER — Ambulatory Visit: Payer: BC Managed Care – PPO | Admitting: Internal Medicine

## 2013-03-12 DIAGNOSIS — Z0289 Encounter for other administrative examinations: Secondary | ICD-10-CM

## 2013-03-25 ENCOUNTER — Encounter: Payer: Self-pay | Admitting: Internal Medicine

## 2013-03-25 NOTE — Progress Notes (Signed)
Received HbA1c value from pt's ObGyn Dr. (Dr. Maxie Better): - 03/20/2013: Hemoglobin A1c 7.9% This is an improvement from last hemoglobin A1c of 8.4% on 01/23/2013 Patient missed her last appointment with me. Will continue the same diabetic regimen for now, and we'll reevaluate at next visit.

## 2013-04-23 ENCOUNTER — Ambulatory Visit: Payer: BC Managed Care – PPO | Admitting: Internal Medicine

## 2013-05-13 LAB — OB RESULTS CONSOLE HEPATITIS B SURFACE ANTIGEN: Hepatitis B Surface Ag: NEGATIVE

## 2013-05-13 LAB — OB RESULTS CONSOLE RUBELLA ANTIBODY, IGM: Rubella: IMMUNE

## 2013-05-13 LAB — OB RESULTS CONSOLE ABO/RH: RH Type: POSITIVE

## 2013-05-13 LAB — OB RESULTS CONSOLE ANTIBODY SCREEN: Antibody Screen: NEGATIVE

## 2013-05-13 LAB — OB RESULTS CONSOLE HIV ANTIBODY (ROUTINE TESTING): HIV: NONREACTIVE

## 2013-05-13 LAB — OB RESULTS CONSOLE RPR: RPR: NONREACTIVE

## 2013-05-28 ENCOUNTER — Ambulatory Visit: Payer: BC Managed Care – PPO | Admitting: Dietician

## 2013-06-10 ENCOUNTER — Other Ambulatory Visit: Payer: Self-pay | Admitting: Obstetrics and Gynecology

## 2013-06-13 ENCOUNTER — Encounter (HOSPITAL_COMMUNITY): Payer: Self-pay | Admitting: Pharmacist

## 2013-06-18 ENCOUNTER — Encounter (HOSPITAL_COMMUNITY): Payer: Self-pay

## 2013-06-18 ENCOUNTER — Encounter (HOSPITAL_COMMUNITY)
Admission: RE | Admit: 2013-06-18 | Discharge: 2013-06-18 | Disposition: A | Discharge status: Home / Self Care | Payer: BC Managed Care – PPO | Source: Ambulatory Visit | Attending: Obstetrics and Gynecology | Admitting: Obstetrics and Gynecology

## 2013-06-18 HISTORY — DX: Gastro-esophageal reflux disease without esophagitis: K21.9

## 2013-06-18 LAB — BASIC METABOLIC PANEL
BUN: 5 mg/dL — ABNORMAL LOW (ref 6–23)
CO2: 22 mEq/L (ref 19–32)
Calcium: 9.2 mg/dL (ref 8.4–10.5)
Chloride: 105 mEq/L (ref 96–112)
Creatinine, Ser: 0.5 mg/dL (ref 0.50–1.10)
GFR calc Af Amer: >90 mL/min (ref >90)
GFR calc non Af Amer: >90 mL/min (ref >90)
Glucose, Bld: 81 mg/dL (ref 70–99)
Potassium: 3.9 mEq/L (ref 3.5–5.1)
Sodium: 136 mEq/L (ref 135–145)

## 2013-06-18 LAB — CBC
HCT: 36.1 % (ref 36.0–46.0)
Hemoglobin: 12.2 g/dL (ref 12.0–15.0)
MCH: 25.5 pg — ABNORMAL LOW (ref 26.0–34.0)
MCHC: 33.8 g/dL (ref 30.0–36.0)
MCV: 75.5 fL — ABNORMAL LOW (ref 78.0–100.0)
Platelets: 295 10*3/uL (ref 150–400)
RBC: 4.78 MIL/uL (ref 3.87–5.11)
RDW: 14.3 % (ref 11.5–15.5)
WBC: 8.7 10*3/uL (ref 4.0–10.5)

## 2013-06-18 NOTE — Patient Instructions (Addendum)
Your procedure is scheduled on:06/21/13  Enter through the Main Entrance at :1030 am Pick up desk phone and dial [phone] and inform us of your arrival.  Please call [phone] if you have any problems the morning of surgery.  Remember: Do not eat food after midnight:Thursday Clear liquids are ok until:8am Friday   You may brush your teeth the morning of surgery.  Take these meds the morning of surgery with a sip of water:BP med HOLD METFORMIN FOR 24 HRS PRIOR TO SURGERY. TAKE 1/2 DOSE BEDTIME INSULIN- NONE ON DAY OF SURGERY  DO NOT wear jewelry, eye make-up, lipstick,body lotion, or dark fingernail polish.  (Polished toes are ok) You may wear deodorant.  If you are to be admitted after surgery, leave suitcase in car until your room has been assigned. Patients discharged on the day of surgery will not be allowed to drive home. Wear loose fitting, comfortable clothes for your ride home.

## 2013-06-20 ENCOUNTER — Inpatient Hospital Stay (HOSPITAL_COMMUNITY)
Admission: AD | Admit: 2013-06-20 | Discharge: 2013-06-20 | Disposition: A | Discharge status: Home / Self Care | Payer: BC Managed Care – PPO | Source: Ambulatory Visit | Attending: Obstetrics and Gynecology | Admitting: Obstetrics and Gynecology

## 2013-06-20 DIAGNOSIS — O10019 Pre-existing essential hypertension complicating pregnancy, unspecified trimester: Secondary | ICD-10-CM | POA: Insufficient documentation

## 2013-06-20 DIAGNOSIS — R109 Unspecified abdominal pain: Secondary | ICD-10-CM | POA: Insufficient documentation

## 2013-06-20 DIAGNOSIS — N949 Unspecified condition associated with female genital organs and menstrual cycle: Secondary | ICD-10-CM | POA: Insufficient documentation

## 2013-06-20 DIAGNOSIS — O3430 Maternal care for cervical incompetence, unspecified trimester: Secondary | ICD-10-CM | POA: Insufficient documentation

## 2013-06-20 DIAGNOSIS — O99810 Abnormal glucose complicating pregnancy: Secondary | ICD-10-CM | POA: Insufficient documentation

## 2013-06-20 LAB — WET PREP, GENITAL
Clue Cells Wet Prep HPF POC: NONE SEEN
Trich, Wet Prep: NONE SEEN
Yeast Wet Prep HPF POC: NONE SEEN

## 2013-06-20 NOTE — MAU Provider Note (Signed)
History    CC. Watery discharge No chief complaint on file. 37 yo G2P1001 MBF now @ 13 4/[redacted] weeks gestation presents for evaluation 2nd to watery vaginal discharge and lwoer abdominal discomfort noted at work today. No vaginal bleeding or recent intercourse. Pt is scheduled for cerclage in am due to hx cervical incompetence   OB History   Grav Para Term Preterm Abortions TAB SAB Ect Mult Living   1               Past Medical History  Diagnosis Date  . Hypertension   . Mastitis     right breast  . Diabetes mellitus     IDDM  . GERD (gastroesophageal reflux disease)     no meds currently    Past Surgical History  Procedure Laterality Date  . Cesarean section    . Cervical cerclage    . Cholecystectomy    . Laparoscopic endometriosis fulguration  01/2011  . Cholecystectomy open  08/2011    Family History  Problem Relation Age of Onset  . Diabetes Maternal Aunt   . Cancer Maternal Grandmother     breast, colon  . Diabetes Maternal Grandmother     History  Substance Use Topics  . Smoking status: Never Smoker   . Smokeless tobacco: Not on file  . Alcohol Use: No    Allergies:  Allergies  Allergen Reactions  . Metronidazole Swelling    Throat swelling  . Shellfish Allergy Swelling    Swelling of the throat    Prescriptions prior to admission  Medication Sig Dispense Refill  . folic acid (FOLVITE) 808 MCG tablet Take 400 mcg by mouth daily.      . insulin aspart (NOVOLOG) 100 UNIT/ML injection Inject 3 Units into the skin daily with breakfast.      . insulin aspart (NOVOLOG) 100 UNIT/ML injection Inject 5 Units into the skin daily with lunch.      . insulin aspart (NOVOLOG) 100 UNIT/ML injection Inject 7 Units into the skin daily with supper.      . insulin detemir (LEVEMIR) 100 UNIT/ML injection Inject 62 Units into the skin at bedtime.      . metFORMIN (GLUCOPHAGE) 1000 MG tablet Take 1,000 mg by mouth 2 (two) times daily with a meal.        . methyldopa  (ALDOMET) 250 MG tablet Take 250 mg by mouth daily.      . Prenatal Vit-Fe Fumarate-FA (MULTIVITAMIN-PRENATAL) 27-0.8 MG TABS tablet Take 1 tablet by mouth daily at 12 noon.         Physical Exam   Blood pressure 145/87, pulse 91, temperature 98.9 F (37.2 C), temperature source Oral, resp. rate 16.  General appearance: alert, cooperative and no distress Abdomen: soft, non-tender; bowel sounds normal; no masses,  no organomegaly Pelvic: adnexae not palpable, cervix normal in appearance, external genitalia normal, no cervical motion tenderness and gravid nontender (+) FHR 156 Extremities: extremities normal, atraumatic, no cyanosis or edema VE creamy white d/c w/o odor. Wet prep done. Cervix long/closed/firm  Bedside sono showed active fetus ant placenta nl fluid  IMP: vaginal discharge in pregnancy Cervical incompetence IUP @ 13 4/7 weeks Chronic HTN on med Class B DM  P) check wet prep. If c/w BV( would be surprising) then need to r/s surgery. Otherwise if neg, keep surgery appt ED Course   MDM  Triston Lisanti A, MD 7:12 PM 06/20/2013

## 2013-06-20 NOTE — Progress Notes (Signed)
Cervix long and closed.

## 2013-06-21 ENCOUNTER — Ambulatory Visit (HOSPITAL_COMMUNITY)
Admission: RE | Admit: 2013-06-21 | Discharge: 2013-06-21 | Disposition: A | Discharge status: Home / Self Care | Payer: BC Managed Care – PPO | Source: Ambulatory Visit | Attending: Obstetrics and Gynecology | Admitting: Obstetrics and Gynecology

## 2013-06-21 ENCOUNTER — Encounter (HOSPITAL_COMMUNITY): Payer: Self-pay | Admitting: Anesthesiology

## 2013-06-21 ENCOUNTER — Ambulatory Visit (HOSPITAL_COMMUNITY): Payer: BC Managed Care – PPO | Admitting: Anesthesiology

## 2013-06-21 ENCOUNTER — Encounter (HOSPITAL_COMMUNITY): Payer: Self-pay | Admitting: *Deleted

## 2013-06-21 ENCOUNTER — Encounter (HOSPITAL_COMMUNITY)
Admission: RE | Disposition: A | Discharge status: Home / Self Care | Payer: Self-pay | Source: Ambulatory Visit | Attending: Obstetrics and Gynecology

## 2013-06-21 DIAGNOSIS — D250 Submucous leiomyoma of uterus: Secondary | ICD-10-CM | POA: Insufficient documentation

## 2013-06-21 DIAGNOSIS — E119 Type 2 diabetes mellitus without complications: Secondary | ICD-10-CM | POA: Insufficient documentation

## 2013-06-21 DIAGNOSIS — O10019 Pre-existing essential hypertension complicating pregnancy, unspecified trimester: Secondary | ICD-10-CM | POA: Insufficient documentation

## 2013-06-21 DIAGNOSIS — O3430 Maternal care for cervical incompetence, unspecified trimester: Secondary | ICD-10-CM | POA: Insufficient documentation

## 2013-06-21 DIAGNOSIS — O3431 Maternal care for cervical incompetence, first trimester: Secondary | ICD-10-CM

## 2013-06-21 DIAGNOSIS — K219 Gastro-esophageal reflux disease without esophagitis: Secondary | ICD-10-CM | POA: Insufficient documentation

## 2013-06-21 DIAGNOSIS — D251 Intramural leiomyoma of uterus: Secondary | ICD-10-CM | POA: Insufficient documentation

## 2013-06-21 DIAGNOSIS — O99891 Other specified diseases and conditions complicating pregnancy: Secondary | ICD-10-CM | POA: Insufficient documentation

## 2013-06-21 DIAGNOSIS — O3410 Maternal care for benign tumor of corpus uteri, unspecified trimester: Secondary | ICD-10-CM | POA: Insufficient documentation

## 2013-06-21 DIAGNOSIS — O09529 Supervision of elderly multigravida, unspecified trimester: Secondary | ICD-10-CM | POA: Insufficient documentation

## 2013-06-21 DIAGNOSIS — O24919 Unspecified diabetes mellitus in pregnancy, unspecified trimester: Secondary | ICD-10-CM | POA: Insufficient documentation

## 2013-06-21 DIAGNOSIS — Z794 Long term (current) use of insulin: Secondary | ICD-10-CM | POA: Insufficient documentation

## 2013-06-21 HISTORY — PX: CERVICAL CERCLAGE: SHX1329

## 2013-06-21 LAB — GLUCOSE, CAPILLARY: Glucose-Capillary: 76 mg/dL (ref 70–99)

## 2013-06-21 SURGERY — CERCLAGE, CERVIX, VAGINAL APPROACH
Anesthesia: Spinal | Site: Vagina | Wound class: Clean Contaminated

## 2013-06-21 MED ORDER — FENTANYL CITRATE 0.05 MG/ML IJ SOLN: 25.0000 ug | INTRAMUSCULAR | Status: DC | PRN

## 2013-06-21 MED ORDER — FENTANYL CITRATE 0.05 MG/ML IJ SOLN
INTRAMUSCULAR | Status: AC
  Filled 2013-06-21: qty 2

## 2013-06-21 MED ORDER — HYDROCODONE-ACETAMINOPHEN 5-300 MG PO TABS: 1.0000 | ORAL_TABLET | Freq: Four times a day (QID) | ORAL | Status: DC | PRN

## 2013-06-21 MED ORDER — LACTATED RINGERS IV SOLN
INTRAVENOUS | Status: DC
  Administered 2013-06-21: 125 mL/h via INTRAVENOUS
  Administered 2013-06-21: 15:00:00 via INTRAVENOUS

## 2013-06-21 MED ORDER — FENTANYL CITRATE 0.05 MG/ML IJ SOLN
INTRAMUSCULAR | Status: DC | PRN
  Administered 2013-06-21 (×2): 50 ug via INTRAVENOUS

## 2013-06-21 MED ORDER — BUPIVACAINE IN DEXTROSE 0.75-8.25 % IT SOLN
INTRATHECAL | Status: DC | PRN
  Administered 2013-06-21: 1 mL via INTRATHECAL

## 2013-06-21 SURGICAL SUPPLY — 20 items
CATH FOLEY 2WAY SLVR 30CC 16FR (CATHETERS) IMPLANT
CATH ROBINSON RED A/P 16FR (CATHETERS) ×2 IMPLANT
CLOTH BEACON ORANGE TIMEOUT ST (SAFETY) ×2 IMPLANT
COUNTER NEEDLE 1200 MAGNETIC (NEEDLE) ×2 IMPLANT
GLOVE BIO SURGEON STRL SZ 6.5 (GLOVE) ×2 IMPLANT
GLOVE BIOGEL PI IND STRL 7.0 (GLOVE) ×1 IMPLANT
GLOVE BIOGEL PI INDICATOR 7.0 (GLOVE) ×1
GOWN STRL REIN XL XLG (GOWN DISPOSABLE) ×4 IMPLANT
NS IRRIG 1000ML POUR BTL (IV SOLUTION) ×2 IMPLANT
PACK VAGINAL MINOR WOMEN LF (CUSTOM PROCEDURE TRAY) ×2 IMPLANT
PAD OB MATERNITY 4.3X12.25 (PERSONAL CARE ITEMS) ×2 IMPLANT
PAD PREP 24X48 CUFFED NSTRL (MISCELLANEOUS) ×2 IMPLANT
SUT ETHIBOND  5 (SUTURE) ×1
SUT ETHIBOND 5 (SUTURE) ×1 IMPLANT
SUT PROLENE 1 CTX 30  8455H (SUTURE) ×1
SUT PROLENE 1 CTX 30 8455H (SUTURE) ×1 IMPLANT
SYR BULB IRRIGATION 50ML (SYRINGE) ×2 IMPLANT
TOWEL OR 17X24 6PK STRL BLUE (TOWEL DISPOSABLE) ×4 IMPLANT
TUBING NON-CON 1/4 X 20 CONN (TUBING) IMPLANT
YANKAUER SUCT BULB TIP NO VENT (SUCTIONS) IMPLANT

## 2013-06-21 NOTE — Anesthesia Postprocedure Evaluation (Signed)
Anesthesia Post Note  Patient: Cindy Long  Procedure(s) Performed: Procedure(s) (LRB): McDonald CERCLAGE CERVICAL (N/A)  Anesthesia type: Spinal  Patient location: PACU  Post pain: Pain level controlled  Post assessment: Post-op Vital signs reviewed  Last Vitals:  Filed Vitals:   06/21/13 1413  BP: 90/47  Pulse: 89  Temp: 36.5 C  Resp: 18    Post vital signs: Reviewed  Level of consciousness: awake  Complications: No apparent anesthesia complications

## 2013-06-21 NOTE — Anesthesia Preprocedure Evaluation (Addendum)
Anesthesia Evaluation  Patient identified by MRN, date of birth, ID band Patient awake    Reviewed: Allergy & Precautions, H&P , NPO status , Patient's Chart, lab work & pertinent test results, reviewed documented beta blocker date and time   History of Anesthesia Complications Negative for: history of anesthetic complications  Airway Mallampati: III TM Distance: >3 FB Neck ROM: full    Dental  (+) Teeth Intact   Pulmonary neg pulmonary ROS,  breath sounds clear to auscultation        Cardiovascular hypertension (on aldomet), On Medications Rhythm:regular Rate:Normal  hyperlipidemia   Neuro/Psych negative neurological ROS  negative psych ROS   GI/Hepatic Neg liver ROS, GERD-  ,IBS   Endo/Other  diabetes, Type 2, Insulin Dependent and Oral Hypoglycemic Agents  Renal/GU negative Renal ROS  negative genitourinary   Musculoskeletal   Abdominal   Peds  Hematology negative hematology ROS (+)   Anesthesia Other Findings Ginger ale before 8 am  Reproductive/Obstetrics (+) Pregnancy (h/o c/s, incompetent cervix for cerclage)                          Anesthesia Physical Anesthesia Plan  ASA: III  Anesthesia Plan: Spinal   Post-op Pain Management:    Induction:   Airway Management Planned:   Additional Equipment:   Intra-op Plan:   Post-operative Plan:   Informed Consent: I have reviewed the patients History and Physical, chart, labs and discussed the procedure including the risks, benefits and alternatives for the proposed anesthesia with the patient or authorized representative who has indicated his/her understanding and acceptance.     Plan Discussed with: Surgeon and CRNA  Anesthesia Plan Comments:         Anesthesia Quick Evaluation

## 2013-06-21 NOTE — Transfer of Care (Signed)
Immediate Anesthesia Transfer of Care Note  Patient: Cindy Long  Procedure(s) Performed: Procedure(s): McDonald CERCLAGE CERVICAL (N/A)  Patient Location: PACU  Anesthesia Type:Spinal  Level of Consciousness: awake, alert  and oriented  Airway & Oxygen Therapy: Patient Spontanous Breathing  Post-op Assessment: Report given to PACU RN and Post -op Vital signs reviewed and stable  Post vital signs: Reviewed and stable  Complications: No apparent anesthesia complications

## 2013-06-21 NOTE — Preoperative (Signed)
Beta Blockers   Reason not to administer Beta Blockers:Not Applicable 

## 2013-06-21 NOTE — Brief Op Note (Signed)
06/21/2013  2:17 PM  PATIENT:  [name]  37 y.o. female  PRE-OPERATIVE DIAGNOSIS:  Cervical Incompetence, Insulin Dependant Diabetes Mellitus, Hypertension  IUP @ 13 5/7 weeks  POST-OPERATIVE DIAGNOSIS:  Cervical Incompetence, Insulin Dependant Diabetes Mellitus, Hypertension  , IUP @ 13 5/7 weeks  PROCEDURE:  Procedure(s): McDonald CERCLAGE CERVICAL (N/A)  SURGEON:  Surgeon(s) and Role:    * Zoraya Fiorenza Clint Bolder, MD - Primary  PHYSICIAN ASSISTANT:   ASSISTANTS: none   ANESTHESIA:   spinal  EBL:  Total I/O In: 800 [I.V.:800] Out: 400 [Urine:400]  BLOOD ADMINISTERED:none  DRAINS: none   LOCAL MEDICATIONS USED:  NONE  SPECIMEN:  No Specimen  DISPOSITION OF SPECIMEN:  N/A  COUNTS:  YES  TOURNIQUET:  * No tourniquets in log *  DICTATION: .Other Dictation: Dictation Number [id number]  PLAN OF CARE: Discharge to home after PACU  PATIENT DISPOSITION:  PACU - hemodynamically stable.   Delay start of Pharmacological VTE agent (>24hrs) due to surgical blood loss or risk of bleeding: no

## 2013-06-21 NOTE — H&P (Signed)
Cindy Long is a 37 y.o. female presenting @ 13 5/[redacted] week gestation for placement of cerclage due to cervical incompetence. Hx notable for chronic HTN on aldomet and Class B DM on insulin. Nl genetic testing. Prev C/S  OB hx: G2P1001  Past Medical History  Diagnosis Date  . Hypertension   . Mastitis     right breast  . Diabetes mellitus     IDDM  . GERD (gastroesophageal reflux disease)     no meds currently   Past Surgical History  Procedure Laterality Date  . Cesarean section    . Cervical cerclage    . Cholecystectomy    . Laparoscopic endometriosis fulguration  01/2011  . Cholecystectomy open  08/2011   Family History: family history includes Cancer in her maternal grandmother; Diabetes in her maternal aunt and maternal grandmother. Social History:  reports that she has never smoked. She does not have any smokeless tobacco history on file. She reports that she does not drink alcohol or use illicit drugs.  ROS:  Negative except vaginal discharge General ROS: negative Genito-Urinary ROS: vaginal d/c wet prep neg    There were no vitals taken for this visit. PHYSICAL EXAM: General: alert, cooperative and no distress Resp: clear to auscultation bilaterally Cardio: regular rate and rhythm, S1, S2 normal, no murmur, click, rub or gallop GI: soft, non-tender; bowel sounds normal; no masses,  no organomegaly Extremities: extremities normal, atraumatic, no cyanosis or edema     Prenatal labs: ABO, Rh:  A positive Antibody:  neg Rubella:  Immune RPR:   NR HBsAg:   neg HIV:   neg GBS:   unknown  Assessment/Plan: Cervical incompetence Class B DM Chronic HTN IUP @ 13 5/7 weeks  P) mcDonald cerclage placement. Procedure explained. risk of surgery reviewed; infection, bleeding, less likely ROM, injury to fetus, may have cervical laceration. ALL ? answered   Cindy Long A 06/21/2013, 4:15 AM

## 2013-06-21 NOTE — Anesthesia Procedure Notes (Signed)
Spinal  Patient location during procedure: OR Start time: 06/21/2013 1:41 PM Staffing Anesthesiologist: Angus Seller., [name]. Performed by: anesthesiologist  Preanesthetic Checklist Completed: patient identified, site marked, surgical consent, pre-op evaluation, timeout performed, IV checked, risks and benefits discussed and monitors and equipment checked Spinal Block Patient position: sitting Prep: DuraPrep Patient monitoring: heart rate, cardiac monitor, continuous pulse ox and blood pressure Approach: midline Location: L3-4 Injection technique: single-shot Needle Needle type: Sprotte  Needle gauge: 24 G Needle length: 9 cm Assessment Sensory level: T4 Additional Notes Patient identified.  Risk benefits discussed including failed block, incomplete pain control, headache, nerve damage, paralysis, blood pressure changes, nausea, vomiting, reactions to medication both toxic or allergic, and postpartum back pain.  Patient expressed understanding and wished to proceed.  All questions were answered.  Sterile technique used throughout procedure.  CSF was clear.  No parasthesia or other complications.  Please see nursing notes for vital signs.

## 2013-06-22 NOTE — Op Note (Signed)
Cindy Long, Cindy Long            ACCOUNT NO.:  [id number]  MEDICAL RECORD NO.:  [id number]  LOCATION:  [facility]                          FACILITY:  [facility]  PHYSICIAN:  Servando Salina, M.D.DATE OF BIRTH:  13-May-1976  DATE OF PROCEDURE:  06/21/2013 DATE OF DISCHARGE:  06/21/2013                              OPERATIVE REPORT   PREOPERATIVE DIAGNOSES:  Cervical incompetence, intrauterine gestation at 68 and 5/7th weeks.  PROCEDURES:  McDonald cervical cerclage placement.  POSTOPERATIVE DIAGNOSES: cervical incompetence, intrauterine gestation at 13 5/7 weeks  ANESTHESIA:  Spinal.  SURGEON:  Servando Salina, M.D.  ASSISTANT:  None.  DESCRIPTION OF PROCEDURE:  Under adequate spinal anesthesia, the patient was placed in the dorsal lithotomy position.  She was sterilely prepped and draped in usual fashion.  Bladder was catheterized for moderate amount of urine.  Examination under anesthesia revealed a gravid Uterus, closed cervix.  No adnexal masses could be appreciated.  A weighted speculum was placed in the vagina.  Retractor was placed anteriorly.  Vagina was irrigated.  Small clamps were placed at the anterior and posterior lip of the cervix.  Using a #1 Prolene, McDonald cerclage was placed at circumferential at the cervicovaginal Junction without difficulty. The cervix was closed.   Good hemostasis noted. The patient tolerated the procedure well and was transferred to recovery room in stable condition. Fetal heart tone will be checked in the recovery room.    Servando Salina, M.D.     [place]/[facility]  D:  06/21/2013  T:  06/21/2013  Job:  [id number]

## 2013-06-24 ENCOUNTER — Encounter (HOSPITAL_COMMUNITY): Payer: Self-pay | Admitting: Obstetrics and Gynecology

## 2013-06-24 LAB — GLUCOSE, CAPILLARY: Glucose-Capillary: 89 mg/dL (ref 70–99)

## 2013-06-28 ENCOUNTER — Ambulatory Visit: Payer: BC Managed Care – PPO | Admitting: *Deleted

## 2013-06-28 LAB — OB RESULTS CONSOLE GC/CHLAMYDIA
Chlamydia: NEGATIVE
Gonorrhea: NEGATIVE

## 2013-08-18 DIAGNOSIS — O24312 Unspecified pre-existing diabetes mellitus in pregnancy, second trimester: Secondary | ICD-10-CM | POA: Insufficient documentation

## 2013-11-19 LAB — OB RESULTS CONSOLE GBS: GBS: NEGATIVE

## 2013-11-25 ENCOUNTER — Inpatient Hospital Stay (HOSPITAL_COMMUNITY)
Admission: AD | Admit: 2013-11-25 | Discharge: 2013-11-26 | Disposition: A | Discharge status: Home / Self Care | Payer: BC Managed Care – PPO | Source: Ambulatory Visit | Attending: Obstetrics and Gynecology | Admitting: Obstetrics and Gynecology

## 2013-11-25 ENCOUNTER — Other Ambulatory Visit: Payer: Self-pay | Admitting: Obstetrics and Gynecology

## 2013-11-25 ENCOUNTER — Encounter (HOSPITAL_COMMUNITY): Payer: Self-pay | Admitting: *Deleted

## 2013-11-25 DIAGNOSIS — O34219 Maternal care for unspecified type scar from previous cesarean delivery: Secondary | ICD-10-CM | POA: Insufficient documentation

## 2013-11-25 DIAGNOSIS — O4700 False labor before 37 completed weeks of gestation, unspecified trimester: Secondary | ICD-10-CM | POA: Insufficient documentation

## 2013-11-25 DIAGNOSIS — Z794 Long term (current) use of insulin: Secondary | ICD-10-CM | POA: Insufficient documentation

## 2013-11-25 DIAGNOSIS — O10019 Pre-existing essential hypertension complicating pregnancy, unspecified trimester: Secondary | ICD-10-CM | POA: Insufficient documentation

## 2013-11-25 DIAGNOSIS — E119 Type 2 diabetes mellitus without complications: Secondary | ICD-10-CM | POA: Insufficient documentation

## 2013-11-25 DIAGNOSIS — O24919 Unspecified diabetes mellitus in pregnancy, unspecified trimester: Secondary | ICD-10-CM | POA: Insufficient documentation

## 2013-11-25 DIAGNOSIS — O3430 Maternal care for cervical incompetence, unspecified trimester: Secondary | ICD-10-CM | POA: Insufficient documentation

## 2013-11-25 LAB — URINALYSIS, ROUTINE W REFLEX MICROSCOPIC
Bilirubin Urine: NEGATIVE
Glucose, UA: NEGATIVE mg/dL
Ketones, ur: 15 mg/dL — AB
Nitrite: NEGATIVE
Protein, ur: NEGATIVE mg/dL
Specific Gravity, Urine: <1.005 — ABNORMAL LOW (ref 1.005–1.030)
Urobilinogen, UA: 0.2 mg/dL (ref 0.0–1.0)
pH: 6 (ref 5.0–8.0)

## 2013-11-25 LAB — URINE MICROSCOPIC-ADD ON

## 2013-11-25 LAB — WET PREP, GENITAL
Clue Cells Wet Prep HPF POC: NONE SEEN
Trich, Wet Prep: NONE SEEN
Yeast Wet Prep HPF POC: NONE SEEN

## 2013-11-25 LAB — GLUCOSE, CAPILLARY: Glucose-Capillary: 94 mg/dL (ref 70–99)

## 2013-11-25 MED ORDER — BUTORPHANOL TARTRATE 1 MG/ML IJ SOLN
1.0000 mg | Freq: Once | INTRAMUSCULAR | Status: DC
  Filled 2013-11-25: qty 1

## 2013-11-25 NOTE — MAU Note (Addendum)
PT SAYS SHE IS INSULIN DEP  DIABETIC   - BLOOD SUGAR YESTERDAY-   IN AM 96.  DID NOT CHECK  TODAY.   SAYS UC HURT BAD TONIGHT  AT 615PM.    HAS CIRCLAGE-    GOING TO [facility] ON 2-10.  DENIES HSV AND MRSA.  SAYS WHILE WAITING IN WAITING ROOM  SHE SAW  "FLOATERS"  .    SAYS URINE IN OFFICE WAS NL.     DENIES H/A NOW,  NO  EPIGASTRIC  PAIN,  NO BLURRED VISION.  .   C/S IS  [facility] ON 2-10

## 2013-11-25 NOTE — MAU Provider Note (Addendum)
History     CSN: [id number]  Arrival date and time: 11/25/13 5170   First Provider Initiated Contact with Patient 11/25/13 2158      Chief Complaint  Patient presents with  . Labor Eval   HPI 38 yo, at 36.[redacted] wks gestation with cerclage, c/o painful UCs since this evening started while in a grocery shop. No bleeding/ leaking fluid but pain strong at 7-8/10. Good FMs. Pregnancy with DM, on Insulin, CHTN on Aldomet, no HA but noted floaters at MAU arrival. BP normal after initial triage BP.  Prior C/s and is scheduled for rpt C/sec and cerclage removal at 39 wks.   Past Medical History  Diagnosis Date  . Hypertension   . Mastitis     right breast  . Diabetes mellitus     IDDM  . GERD (gastroesophageal reflux disease)     no meds currently    Past Surgical History  Procedure Laterality Date  . Cesarean section    . Cervical cerclage    . Cholecystectomy    . Laparoscopic endometriosis fulguration  01/2011  . Cholecystectomy open  08/2011  . Cervical cerclage N/A 06/21/2013    Procedure: McDonald CERCLAGE CERVICAL;  Surgeon: Marvene Staff, MD;  Location: [facility];  Service: Gynecology;  Laterality: N/A;    Family History  Problem Relation Age of Onset  . Diabetes Maternal Aunt   . Cancer Maternal Grandmother     breast, colon  . Diabetes Maternal Grandmother     History  Substance Use Topics  . Smoking status: Never Smoker   . Smokeless tobacco: Not on file  . Alcohol Use: No    Allergies:  Allergies  Allergen Reactions  . Metronidazole Swelling    Throat swelling  . Shellfish Allergy Swelling    Swelling of the throat    Prescriptions prior to admission  Medication Sig Dispense Refill  . folic acid (FOLVITE) 017 MCG tablet Take 400 mcg by mouth daily.      . insulin aspart (NOVOLOG) 100 UNIT/ML injection Inject 3-9 Units into the skin 3 (three) times daily with meals. 3 units at breakfast, 5 units at lunch, and 9 units at supper      . insulin detemir  (LEVEMIR) 100 UNIT/ML injection Inject 70 Units into the skin at bedtime.       . IRON PO Take 1 tablet by mouth daily.      . Magnesium 250 MG TABS Take 1 tablet by mouth 2 (two) times daily.      . metFORMIN (GLUCOPHAGE) 1000 MG tablet Take 1,000 mg by mouth 2 (two) times daily with a meal.        . methyldopa (ALDOMET) 250 MG tablet Take 250 mg by mouth daily.      . Prenatal Vit-Fe Fumarate-FA (MULTIVITAMIN-PRENATAL) 27-0.8 MG TABS tablet Take 1 tablet by mouth daily at 12 noon.        ROS Physical Exam   Blood pressure 111/64, pulse 112, temperature 99 F (37.2 C), temperature source Oral, resp. rate 18, height 5' 6"  (1.676 m), weight 194 lb (87.998 kg), last menstrual period 03/17/2013, SpO2 99.00%.  Physical Exam A&O x 3, no acute distress. Pleasant Abdo soft, non tender, non acute, palpable mild UCs. Extr no edema/ tenderness Pelvic cervix swollen. Discharge ++ (wet prep sent). Stitch under tension with minimal brown/ red tinge of blood when pulled on the stitch.  FHT  140s / reactive Toco q 2-3 min with intermittent irritability  MAU Course  Procedures Cerclage removal attempted several times and the knot got cut but cerclage buried in cervical body. Will reattempt removal after 1 mg stadol.  Assessment and Plan  36.1 wks, prior c-section, contractions with pain, plan cerclage removal, stadol and observe if pain and UCs resolve. Keep NPO in case labor/UCs got stronger for possible repeat C/sec. Reviewed plan, pt agrees.   Arabella Revelle R 11/25/2013, 11:03 PM   MD note 2 am.  Pt assessed again after Stadol 1 mg IV x 1. She was sleeping. No UCs or pain. NO loss of fluid.  VS stable. BP 101/51  Pulse 86  Temp(Src) 99 F (37.2 C) (Oral)  Resp 18  Ht 5' 6"  (1.676 m)  Wt 194 lb (87.998 kg)  BMI 31.33 kg/m2  SpO2 99%  LMP 03/17/2013  Abdomen soft, UCs resolved.  cervix no active bleeding, cervix long feels 1-2 cm dilated, membranes intact. Vtx. Stitch felt in the body of  cervix.  A/P: Uterine contractions resolved with rest, Stadol and IV hydration.  Discharge home. May need to take take ff work tomorrow. F/up Dr Garwin Brothers 1 days. Labor precautions and FAC. Will take 1/2 of her Levamir dose tonight (35 units).  Will need to reattempt cerclage removal at another time once cervical swelling improved.

## 2013-11-25 NOTE — MAU Note (Signed)
Pt reports contractions and back pain. Pt has a cerclage.

## 2013-11-26 LAB — GLUCOSE, CAPILLARY: Glucose-Capillary: 93 mg/dL (ref 70–99)

## 2013-11-26 LAB — URINE CULTURE
Colony Count: NO GROWTH
Culture: NO GROWTH

## 2013-11-26 MED ORDER — LACTATED RINGERS IV SOLN
INTRAVENOUS | Status: DC
  Administered 2013-11-26: via INTRAVENOUS

## 2013-11-26 MED ORDER — BUTORPHANOL TARTRATE 1 MG/ML IJ SOLN
1.0000 mg | Freq: Once | INTRAMUSCULAR | Status: AC
  Administered 2013-11-26: 1 mg via INTRAVENOUS
  Filled 2013-11-26: qty 1

## 2013-11-29 ENCOUNTER — Encounter (HOSPITAL_COMMUNITY): Payer: Self-pay | Admitting: *Deleted

## 2013-11-29 ENCOUNTER — Inpatient Hospital Stay (HOSPITAL_COMMUNITY)
Admission: AD | Admit: 2013-11-29 | Discharge: 2013-11-30 | DRG: 778 | Disposition: A | Discharge status: Home / Self Care | Payer: BC Managed Care – PPO | Source: Ambulatory Visit | Attending: Obstetrics and Gynecology | Admitting: Obstetrics and Gynecology

## 2013-11-29 DIAGNOSIS — O4700 False labor before 37 completed weeks of gestation, unspecified trimester: Principal | ICD-10-CM | POA: Diagnosis present

## 2013-11-29 DIAGNOSIS — IMO0002 Reserved for concepts with insufficient information to code with codable children: Secondary | ICD-10-CM

## 2013-11-29 DIAGNOSIS — O09529 Supervision of elderly multigravida, unspecified trimester: Secondary | ICD-10-CM | POA: Diagnosis present

## 2013-11-29 DIAGNOSIS — O34219 Maternal care for unspecified type scar from previous cesarean delivery: Secondary | ICD-10-CM | POA: Diagnosis present

## 2013-11-29 DIAGNOSIS — E119 Type 2 diabetes mellitus without complications: Secondary | ICD-10-CM | POA: Diagnosis present

## 2013-11-29 DIAGNOSIS — O3430 Maternal care for cervical incompetence, unspecified trimester: Secondary | ICD-10-CM | POA: Diagnosis present

## 2013-11-29 DIAGNOSIS — Z794 Long term (current) use of insulin: Secondary | ICD-10-CM

## 2013-11-29 DIAGNOSIS — O4703 False labor before 37 completed weeks of gestation, third trimester: Secondary | ICD-10-CM

## 2013-11-29 DIAGNOSIS — O10019 Pre-existing essential hypertension complicating pregnancy, unspecified trimester: Secondary | ICD-10-CM | POA: Diagnosis present

## 2013-11-29 DIAGNOSIS — O10919 Unspecified pre-existing hypertension complicating pregnancy, unspecified trimester: Secondary | ICD-10-CM

## 2013-11-29 DIAGNOSIS — O3660X0 Maternal care for excessive fetal growth, unspecified trimester, not applicable or unspecified: Secondary | ICD-10-CM | POA: Diagnosis present

## 2013-11-29 DIAGNOSIS — O24919 Unspecified diabetes mellitus in pregnancy, unspecified trimester: Secondary | ICD-10-CM | POA: Diagnosis present

## 2013-11-29 DIAGNOSIS — O3433 Maternal care for cervical incompetence, third trimester: Secondary | ICD-10-CM

## 2013-11-29 LAB — URINALYSIS, ROUTINE W REFLEX MICROSCOPIC
Bilirubin Urine: NEGATIVE
Glucose, UA: NEGATIVE mg/dL
Ketones, ur: NEGATIVE mg/dL
Nitrite: NEGATIVE
Protein, ur: NEGATIVE mg/dL
Specific Gravity, Urine: 1.01 (ref 1.005–1.030)
Urobilinogen, UA: 0.2 mg/dL (ref 0.0–1.0)
pH: 6 (ref 5.0–8.0)

## 2013-11-29 LAB — URINE MICROSCOPIC-ADD ON

## 2013-11-29 NOTE — MAU Note (Signed)
Contractions all day but 85mins apart since about 1900. Denies bleeding or leaking fld. Some thick yellow d/c

## 2013-11-30 ENCOUNTER — Encounter (HOSPITAL_COMMUNITY): Payer: Self-pay | Admitting: *Deleted

## 2013-11-30 LAB — CBC
HCT: 33.3 % — ABNORMAL LOW (ref 36.0–46.0)
Hemoglobin: 11 g/dL — ABNORMAL LOW (ref 12.0–15.0)
MCH: 23.5 pg — ABNORMAL LOW (ref 26.0–34.0)
MCHC: 33 g/dL (ref 30.0–36.0)
MCV: 71 fL — ABNORMAL LOW (ref 78.0–100.0)
Platelets: 204 10*3/uL (ref 150–400)
RBC: 4.69 MIL/uL (ref 3.87–5.11)
RDW: 19.5 % — ABNORMAL HIGH (ref 11.5–15.5)
WBC: 7.2 10*3/uL (ref 4.0–10.5)

## 2013-11-30 LAB — TYPE AND SCREEN
ABO/RH(D): A POS
Antibody Screen: NEGATIVE

## 2013-11-30 LAB — ABO/RH: ABO/RH(D): A POS

## 2013-11-30 LAB — RPR: RPR Ser Ql: NONREACTIVE

## 2013-11-30 LAB — GLUCOSE, CAPILLARY
Glucose-Capillary: 101 mg/dL — ABNORMAL HIGH (ref 70–99)
Glucose-Capillary: 89 mg/dL (ref 70–99)

## 2013-11-30 MED ORDER — OXYCODONE-ACETAMINOPHEN 5-325 MG PO TABS: 1.0000 | ORAL_TABLET | ORAL | Status: DC | PRN

## 2013-11-30 MED ORDER — ONDANSETRON HCL 4 MG/2ML IJ SOLN: 4.0000 mg | Freq: Four times a day (QID) | INTRAMUSCULAR | Status: DC | PRN

## 2013-11-30 MED ORDER — LACTATED RINGERS IV SOLN
INTRAVENOUS | Status: DC
  Administered 2013-11-30 (×2): via INTRAVENOUS

## 2013-11-30 MED ORDER — CITRIC ACID-SODIUM CITRATE 334-500 MG/5ML PO SOLN: 30.0000 mL | ORAL | Status: DC | PRN

## 2013-11-30 MED ORDER — IBUPROFEN 600 MG PO TABS: 600.0000 mg | ORAL_TABLET | Freq: Four times a day (QID) | ORAL | Status: DC | PRN

## 2013-11-30 MED ORDER — LACTATED RINGERS IV SOLN: 500.0000 mL | INTRAVENOUS | Status: DC | PRN

## 2013-11-30 MED ORDER — BUTORPHANOL TARTRATE 1 MG/ML IJ SOLN
1.0000 mg | INTRAMUSCULAR | Status: DC | PRN
  Administered 2013-11-30: 1 mg via INTRAVENOUS
  Filled 2013-11-30: qty 1

## 2013-11-30 MED ORDER — ZOLPIDEM TARTRATE 5 MG PO TABS
5.0000 mg | ORAL_TABLET | Freq: Every evening | ORAL | Status: DC | PRN
  Administered 2013-11-30: 5 mg via ORAL
  Filled 2013-11-30: qty 1

## 2013-11-30 MED ORDER — OXYTOCIN BOLUS FROM INFUSION: 500.0000 mL | INTRAVENOUS | Status: DC

## 2013-11-30 MED ORDER — INSULIN DETEMIR 100 UNIT/ML ~~LOC~~ SOLN
70.0000 [IU] | Freq: Every day | SUBCUTANEOUS | Status: DC
  Administered 2013-11-30: 70 [IU] via SUBCUTANEOUS
  Filled 2013-11-30: qty 0.7

## 2013-11-30 MED ORDER — ACETAMINOPHEN 325 MG PO TABS: 650.0000 mg | ORAL_TABLET | ORAL | Status: DC | PRN

## 2013-11-30 MED ORDER — LIDOCAINE HCL (PF) 1 % IJ SOLN: 30.0000 mL | INTRAMUSCULAR | Status: DC | PRN

## 2013-11-30 MED ORDER — OXYTOCIN 40 UNITS IN LACTATED RINGERS INFUSION - SIMPLE MED: 62.5000 mL/h | INTRAVENOUS | Status: DC

## 2013-11-30 MED ORDER — FLEET ENEMA 7-19 GM/118ML RE ENEM: 1.0000 | ENEMA | RECTAL | Status: DC | PRN

## 2013-11-30 NOTE — H&P (Signed)
Cindy Long is a 38 y.o. female presenting for observation to r/o labor.@ 36 6/7 weeks. Hx notable for Class B DM, chronic HTN on med, cervical incompetence w/ partial removal of cerclage and fetal macrosomia on last sono( 8lb 12 oz). Previous C/S scheduled for repeat C/S 2/10 Pt c/o ctx  With intact membrane. GBS cx neg  Maternal Medical History:  Reason for admission: Contractions.   Contractions: Onset was 6-12 hours ago.   Frequency: regular.    Fetal activity: Perceived fetal activity is normal.    Prenatal complications: PIH.   Prenatal Complications - Diabetes: type 2. Diabetes is managed by insulin injections.      OB History   Grav Para Term Preterm Abortions TAB SAB Ect Mult Living   2 1 1       1      Past Medical History  Diagnosis Date  . Hypertension   . Mastitis     right breast  . Diabetes mellitus     IDDM  . GERD (gastroesophageal reflux disease)     no meds currently   Past Surgical History  Procedure Laterality Date  . Cesarean section    . Cervical cerclage    . Cholecystectomy    . Laparoscopic endometriosis fulguration  01/2011  . Cholecystectomy open  08/2011  . Cervical cerclage N/A 06/21/2013    Procedure: McDonald CERCLAGE CERVICAL;  Surgeon: Marvene Staff, MD;  Location: [facility];  Service: Gynecology;  Laterality: N/A;   Family History: family history includes Cancer in her maternal grandmother; Diabetes in her maternal aunt and maternal grandmother. Social History:  reports that she has never smoked. She does not have any smokeless tobacco history on file. She reports that she does not drink alcohol or use illicit drugs.   Prenatal Transfer Tool  Maternal Diabetes: Yes:  Diabetes Type:  Pre-pregnancy Genetic Screening: Normal Maternal Ultrasounds/Referrals: Normal Fetal Ultrasounds or other Referrals:  Fetal echo Maternal Substance Abuse:  No Significant Maternal Medications:  Meds include: Other: insulin, aldomet,  levimir Significant Maternal Lab Results:  Lab values include: Group B Strep negative Other Comments:  chronic HTN, Class B DM  Review of Systems  All other systems reviewed and are negative.    Dilation: 3 Effacement (%): 50 Station: -3 Exam by:: L. Munford RN Blood pressure 105/67, pulse 101, temperature 97.9 F (36.6 C), temperature source Axillary, resp. rate 18, height 5' 6"  (1.676 m), weight 87.907 kg (193 lb 12.8 oz), last menstrual period 03/17/2013, SpO2 99.00%. Exam Physical Exam  Constitutional: She is oriented to person, place, and time. She appears well-developed and well-nourished.  Neck: Neck supple.  Cardiovascular: Regular rhythm.   Respiratory: Breath sounds normal.  GI: Soft.  Musculoskeletal: She exhibits no edema.  Neurological: She is alert and oriented to person, place, and time.  Skin: Skin is warm and dry.  Psychiatric: She has a normal mood and affect.    Prenatal labs: ABO, Rh: A/Positive/-- (07/07 0000) Antibody: Negative (07/07 0000) Rubella: Immune (07/07 0000) RPR: Nonreactive (07/07 0000)  HBsAg: Negative (07/07 0000)  HIV: Non-reactive (07/07 0000)  GBS: Negative (01/13 0000)   Assessment/Plan: Prodromal labor Previous C/S  Class B DM on insulin Chronic HTn on aldomet IUP @ 36 6/7 weeks Cervical incompetence with partial stitch  P) Observation. Cont insulin. Cont fetal monitoring cont BP med. Analgesic prn   Nickolaus Bordelon A 11/30/2013, 2:04 AM

## 2013-11-30 NOTE — Progress Notes (Signed)
Cindy Long is a 38 y.o. G2P1001 at 56w6dby ultrasound admitted for observation due to CTX  Subjective: Chief Complaint  Patient presents with  . Contractions    Objective: BP 132/85  Pulse 106  Temp(Src) 98 F (36.7 C) (Oral)  Resp 16  Ht 5' 6"  (1.676 m)  Wt 87.907 kg (193 lb 12.8 oz)  BMI 31.30 kg/m2  SpO2 99%  LMP 03/17/2013      FHT:  FHR: 130 bpm, variability: marked,  accelerations:  Present,  decelerations:  Absent UC:   irregular SVE:   3/40/ballottable vtx Tracing: cat 1 baseline 130-135  Labs: Lab Results  Component Value Date   WBC 7.2 11/30/2013   HGB 11.0* 11/30/2013   HCT 33.3* 11/30/2013   MCV 71.0* 11/30/2013   PLT 204 11/30/2013  BS=89  Assessment / Plan: Prodromal labor resolved Class B DM on insulin Chronic HTN on aldmoet Cervical incompetence IUP @ 37 week Fetal macrosomia P) d/c home labor prec. Cont current meds. Keep OB appt 1/26   Anticipated MOD:  c/s  Cindy Long A 11/30/2013, 11:57 AM

## 2013-11-30 NOTE — Discharge Summary (Signed)
Obstetric Discharge Summary Reason for Admission: prodromal labor Prenatal Procedures: cerclage, NST and ultrasound Intrapartum Procedures: n/a Postpartum Procedures: n/a Complications-Operative and Postpartum: n/a Hemoglobin  Date Value Range Status  11/30/2013 11.0* 12.0 - 15.0 g/dL Final     HCT  Date Value Range Status  11/30/2013 33.3* 36.0 - 46.0 % Final    Physical Exam:  General: alert, cooperative and no distress Lungs clear to A Cor RRR  Abd gravid nontender Pelvic 3/50/ballottable Extr: no edema or calf tenderness   Hospital course: pt presented to r/o labor. Despite no cervical change, pt 's contractions increased in frequency and was therefore placed in observation . With ctx essential spaced and no change, d/c home Discharge Diagnoses: prodromal labor resolved 2) Class B DM 3) chronic HTn 4) cervical incompetence  Discharge Information: Date: 11/30/2013 Activity: unrestricted Diet: NAS diet, CHO modified diet Medications: PNV and levimer, insulin, aldomet Condition: stable Instructions: labor precautions, PIH warning signs Discharge to: home Follow-up Information   Follow up with Marvene Staff, MD On 12/02/2013.   Specialty:  Obstetrics and Gynecology   Contact information:   [address] [place] [address] [place] A 11/30/2013, 12:06 PM

## 2013-11-30 NOTE — Discharge Instructions (Signed)
Labor precautions, daily kick ct, call if decreased fetal movements, rupture of membrane

## 2013-12-01 LAB — URINE CULTURE: Colony Count: 60000

## 2013-12-05 ENCOUNTER — Ambulatory Visit (HOSPITAL_COMMUNITY): Payer: BC Managed Care – PPO

## 2013-12-05 ENCOUNTER — Ambulatory Visit (HOSPITAL_COMMUNITY)
Admission: RE | Admit: 2013-12-05 | Discharge: 2013-12-05 | Disposition: A | Discharge status: Home / Self Care | Payer: BC Managed Care – PPO | Source: Ambulatory Visit | Attending: Obstetrics and Gynecology | Admitting: Obstetrics and Gynecology

## 2013-12-05 ENCOUNTER — Other Ambulatory Visit (HOSPITAL_COMMUNITY): Payer: Self-pay | Admitting: Obstetrics and Gynecology

## 2013-12-05 DIAGNOSIS — O24919 Unspecified diabetes mellitus in pregnancy, unspecified trimester: Secondary | ICD-10-CM | POA: Insufficient documentation

## 2013-12-05 DIAGNOSIS — E1139 Type 2 diabetes mellitus with other diabetic ophthalmic complication: Secondary | ICD-10-CM | POA: Insufficient documentation

## 2013-12-05 DIAGNOSIS — O289 Unspecified abnormal findings on antenatal screening of mother: Secondary | ICD-10-CM | POA: Insufficient documentation

## 2013-12-05 DIAGNOSIS — O288 Other abnormal findings on antenatal screening of mother: Secondary | ICD-10-CM

## 2013-12-05 DIAGNOSIS — IMO0002 Reserved for concepts with insufficient information to code with codable children: Secondary | ICD-10-CM

## 2013-12-05 DIAGNOSIS — O09529 Supervision of elderly multigravida, unspecified trimester: Secondary | ICD-10-CM | POA: Insufficient documentation

## 2013-12-05 DIAGNOSIS — O139 Gestational [pregnancy-induced] hypertension without significant proteinuria, unspecified trimester: Secondary | ICD-10-CM | POA: Insufficient documentation

## 2013-12-06 ENCOUNTER — Encounter (HOSPITAL_COMMUNITY): Payer: Self-pay | Admitting: Pharmacist

## 2013-12-16 ENCOUNTER — Encounter (HOSPITAL_COMMUNITY)
Admission: RE | Admit: 2013-12-16 | Discharge: 2013-12-16 | Disposition: A | Discharge status: Home / Self Care | Payer: BC Managed Care – PPO | Source: Ambulatory Visit | Attending: Obstetrics and Gynecology | Admitting: Obstetrics and Gynecology

## 2013-12-16 ENCOUNTER — Encounter (HOSPITAL_COMMUNITY): Payer: Self-pay

## 2013-12-16 LAB — BASIC METABOLIC PANEL
BUN: 3 mg/dL — ABNORMAL LOW (ref 6–23)
CO2: 17 mEq/L — ABNORMAL LOW (ref 19–32)
Calcium: 9.3 mg/dL (ref 8.4–10.5)
Chloride: 104 mEq/L (ref 96–112)
Creatinine, Ser: 0.59 mg/dL (ref 0.50–1.10)
GFR calc Af Amer: >90 mL/min (ref >90)
GFR calc non Af Amer: >90 mL/min (ref >90)
Glucose, Bld: 125 mg/dL — ABNORMAL HIGH (ref 70–99)
Potassium: 3.7 mEq/L (ref 3.7–5.3)
Sodium: 137 mEq/L (ref 137–147)

## 2013-12-16 LAB — RPR: RPR Ser Ql: NONREACTIVE

## 2013-12-16 LAB — CBC
HCT: 34.8 % — ABNORMAL LOW (ref 36.0–46.0)
Hemoglobin: 11.4 g/dL — ABNORMAL LOW (ref 12.0–15.0)
MCH: 23.4 pg — ABNORMAL LOW (ref 26.0–34.0)
MCHC: 32.8 g/dL (ref 30.0–36.0)
MCV: 71.5 fL — ABNORMAL LOW (ref 78.0–100.0)
Platelets: 226 10*3/uL (ref 150–400)
RBC: 4.87 MIL/uL (ref 3.87–5.11)
RDW: 21 % — ABNORMAL HIGH (ref 11.5–15.5)
WBC: 6.1 10*3/uL (ref 4.0–10.5)

## 2013-12-16 LAB — TYPE AND SCREEN
ABO/RH(D): A POS
Antibody Screen: NEGATIVE

## 2013-12-16 MED ORDER — CEFAZOLIN SODIUM-DEXTROSE 2-3 GM-% IV SOLR
2.0000 g | INTRAVENOUS | Status: AC
  Administered 2013-12-17: 2 g via INTRAVENOUS

## 2013-12-16 NOTE — Patient Instructions (Signed)
[place]  12/16/2013   Your procedure is scheduled on:  12/17/13  Enter through the Main Entrance of [facility] [place] up the phone at the desk and dial [phone].   Call this number if you have problems the morning of surgery: [phone]   Remember:   Do not eat food:After Midnight.  Do not drink clear liquids: 4 Hours before arrival.  Take these medicines the morning of surgery with A SIP OF WATER: Protonix, 1/2 bedtime insulin as instruct. By Dr Bubba Camp   Do not wear jewelry, make-up or nail polish.  Do not wear lotions, powders, or perfumes. You may wear deodorant.  Do not shave 48 hours prior to surgery.  Do not bring valuables to the hospital.  [facility] is not   responsible for any belongings or valuables brought to the hospital.  Contacts, dentures or bridgework may not be worn into surgery.  Leave suitcase in the car. After surgery it may be brought to your room.  For patients admitted to the hospital, checkout time is 11:00 AM the day of              discharge.   Patients discharged the day of surgery will not be allowed to drive             home.  Name and phone number of your driver: NA  Special Instructions:      Please read over the following fact sheets that you were given:   Surgical Site Infection Prevention

## 2013-12-17 ENCOUNTER — Encounter (HOSPITAL_COMMUNITY): Payer: Self-pay | Admitting: *Deleted

## 2013-12-17 ENCOUNTER — Encounter (HOSPITAL_COMMUNITY)
Admission: RE | Disposition: A | Discharge status: Home / Self Care | Payer: Self-pay | Source: Ambulatory Visit | Attending: Obstetrics and Gynecology

## 2013-12-17 ENCOUNTER — Encounter (HOSPITAL_COMMUNITY): Payer: BC Managed Care – PPO | Admitting: Anesthesiology

## 2013-12-17 ENCOUNTER — Inpatient Hospital Stay (HOSPITAL_COMMUNITY): Payer: BC Managed Care – PPO | Admitting: Anesthesiology

## 2013-12-17 ENCOUNTER — Inpatient Hospital Stay (HOSPITAL_COMMUNITY)
Admission: RE | Admit: 2013-12-17 | Discharge: 2013-12-20 | DRG: 765 | Disposition: A | Discharge status: Home / Self Care | Payer: BC Managed Care – PPO | Source: Ambulatory Visit | Attending: Obstetrics and Gynecology | Admitting: Obstetrics and Gynecology

## 2013-12-17 DIAGNOSIS — O3430 Maternal care for cervical incompetence, unspecified trimester: Secondary | ICD-10-CM | POA: Diagnosis present

## 2013-12-17 DIAGNOSIS — K589 Irritable bowel syndrome without diarrhea: Secondary | ICD-10-CM

## 2013-12-17 DIAGNOSIS — Z794 Long term (current) use of insulin: Secondary | ICD-10-CM

## 2013-12-17 DIAGNOSIS — E785 Hyperlipidemia, unspecified: Secondary | ICD-10-CM

## 2013-12-17 DIAGNOSIS — O1002 Pre-existing essential hypertension complicating childbirth: Secondary | ICD-10-CM | POA: Diagnosis present

## 2013-12-17 DIAGNOSIS — O2432 Unspecified pre-existing diabetes mellitus in childbirth: Secondary | ICD-10-CM | POA: Diagnosis present

## 2013-12-17 DIAGNOSIS — E119 Type 2 diabetes mellitus without complications: Secondary | ICD-10-CM | POA: Diagnosis present

## 2013-12-17 DIAGNOSIS — O3660X0 Maternal care for excessive fetal growth, unspecified trimester, not applicable or unspecified: Secondary | ICD-10-CM | POA: Diagnosis present

## 2013-12-17 DIAGNOSIS — O34219 Maternal care for unspecified type scar from previous cesarean delivery: Principal | ICD-10-CM | POA: Diagnosis present

## 2013-12-17 LAB — GLUCOSE, CAPILLARY
Glucose-Capillary: 78 mg/dL (ref 70–99)
Glucose-Capillary: 67 mg/dL — ABNORMAL LOW (ref 70–99)
Glucose-Capillary: 115 mg/dL — ABNORMAL HIGH (ref 70–99)
Glucose-Capillary: 149 mg/dL — ABNORMAL HIGH (ref 70–99)

## 2013-12-17 SURGERY — Surgical Case
Anesthesia: Spinal | Site: Abdomen

## 2013-12-17 MED ORDER — SIMETHICONE 80 MG PO CHEW
80.0000 mg | CHEWABLE_TABLET | Freq: Three times a day (TID) | ORAL | Status: DC
  Administered 2013-12-18 – 2013-12-20 (×7): 80 mg via ORAL
  Filled 2013-12-17 (×7): qty 1

## 2013-12-17 MED ORDER — FERROUS SULFATE 325 (65 FE) MG PO TABS
325.0000 mg | ORAL_TABLET | Freq: Two times a day (BID) | ORAL | Status: DC
  Administered 2013-12-18 – 2013-12-20 (×5): 325 mg via ORAL
  Filled 2013-12-17 (×5): qty 1

## 2013-12-17 MED ORDER — MORPHINE SULFATE (PF) 0.5 MG/ML IJ SOLN
INTRAMUSCULAR | Status: DC | PRN
  Administered 2013-12-17: .1 mg via INTRATHECAL

## 2013-12-17 MED ORDER — KETOROLAC TROMETHAMINE 30 MG/ML IJ SOLN
INTRAMUSCULAR | Status: AC
Start: 2013-12-17 — End: 2013-12-17
  Administered 2013-12-17: 30 mg via INTRAVENOUS
  Filled 2013-12-17: qty 1

## 2013-12-17 MED ORDER — PHENYLEPHRINE HCL 10 MG/ML IJ SOLN
INTRAMUSCULAR | Status: DC | PRN
  Administered 2013-12-17: 120 ug via INTRAVENOUS
  Administered 2013-12-17 (×2): 40 ug via INTRAVENOUS

## 2013-12-17 MED ORDER — SODIUM CHLORIDE 0.9 % IV SOLN: 250.0000 mL | INTRAVENOUS | Status: DC

## 2013-12-17 MED ORDER — WITCH HAZEL-GLYCERIN EX PADS: 1.0000 "application " | MEDICATED_PAD | CUTANEOUS | Status: DC | PRN

## 2013-12-17 MED ORDER — BUPIVACAINE IN DEXTROSE 0.75-8.25 % IT SOLN
INTRATHECAL | Status: DC | PRN
  Administered 2013-12-17: 1.4 mL via INTRATHECAL

## 2013-12-17 MED ORDER — OXYTOCIN 10 UNIT/ML IJ SOLN
40.0000 [IU] | INTRAVENOUS | Status: DC | PRN
  Administered 2013-12-17: 40 [IU] via INTRAVENOUS

## 2013-12-17 MED ORDER — DIPHENHYDRAMINE HCL 25 MG PO CAPS
25.0000 mg | ORAL_CAPSULE | Freq: Four times a day (QID) | ORAL | Status: DC | PRN
  Administered 2013-12-18: 25 mg via ORAL
  Filled 2013-12-17: qty 1

## 2013-12-17 MED ORDER — SODIUM CHLORIDE 0.9 % IJ SOLN: 3.0000 mL | Freq: Two times a day (BID) | INTRAMUSCULAR | Status: DC

## 2013-12-17 MED ORDER — MEPERIDINE HCL 25 MG/ML IJ SOLN
6.2500 mg | INTRAMUSCULAR | Status: DC | PRN
Start: 2013-12-17 — End: 2013-12-17

## 2013-12-17 MED ORDER — DEXTROSE IN LACTATED RINGERS 5 % IV SOLN
INTRAVENOUS | Status: DC
  Administered 2013-12-17 – 2013-12-18 (×2): via INTRAVENOUS

## 2013-12-17 MED ORDER — DIPHENHYDRAMINE HCL 25 MG PO CAPS: 25.0000 mg | ORAL_CAPSULE | ORAL | Status: DC | PRN

## 2013-12-17 MED ORDER — ONDANSETRON HCL 4 MG PO TABS: 4.0000 mg | ORAL_TABLET | ORAL | Status: DC | PRN

## 2013-12-17 MED ORDER — INSULIN ASPART 100 UNIT/ML ~~LOC~~ SOLN: 9.0000 [IU] | Freq: Every day | SUBCUTANEOUS | Status: DC

## 2013-12-17 MED ORDER — DIPHENHYDRAMINE HCL 50 MG/ML IJ SOLN: 25.0000 mg | INTRAMUSCULAR | Status: DC | PRN

## 2013-12-17 MED ORDER — OXYTOCIN 40 UNITS IN LACTATED RINGERS INFUSION - SIMPLE MED: 62.5000 mL/h | INTRAVENOUS | Status: AC

## 2013-12-17 MED ORDER — INSULIN ASPART 100 UNIT/ML ~~LOC~~ SOLN
3.0000 [IU] | Freq: Every day | SUBCUTANEOUS | Status: DC
Start: 2013-12-18 — End: 2013-12-19
  Administered 2013-12-18: 3 [IU] via SUBCUTANEOUS

## 2013-12-17 MED ORDER — PROMETHAZINE HCL 25 MG/ML IJ SOLN: 6.2500 mg | INTRAMUSCULAR | Status: DC | PRN

## 2013-12-17 MED ORDER — KETOROLAC TROMETHAMINE 30 MG/ML IJ SOLN
15.0000 mg | Freq: Once | INTRAMUSCULAR | Status: AC | PRN
  Administered 2013-12-17: 30 mg via INTRAVENOUS

## 2013-12-17 MED ORDER — FENTANYL CITRATE 0.05 MG/ML IJ SOLN
INTRAMUSCULAR | Status: DC | PRN
  Administered 2013-12-17: 12.5 ug via INTRATHECAL

## 2013-12-17 MED ORDER — MEPERIDINE HCL 25 MG/ML IJ SOLN: 6.2500 mg | INTRAMUSCULAR | Status: DC | PRN

## 2013-12-17 MED ORDER — FENTANYL CITRATE 0.05 MG/ML IJ SOLN
INTRAMUSCULAR | Status: AC
  Filled 2013-12-17: qty 2

## 2013-12-17 MED ORDER — METFORMIN HCL 500 MG PO TABS
1000.0000 mg | ORAL_TABLET | Freq: Two times a day (BID) | ORAL | Status: DC
  Administered 2013-12-18 – 2013-12-19 (×4): 1000 mg via ORAL
  Filled 2013-12-17 (×5): qty 2

## 2013-12-17 MED ORDER — DIBUCAINE 1 % RE OINT: 1.0000 "application " | TOPICAL_OINTMENT | RECTAL | Status: DC | PRN

## 2013-12-17 MED ORDER — MORPHINE SULFATE 0.5 MG/ML IJ SOLN
INTRAMUSCULAR | Status: AC
  Filled 2013-12-17: qty 10

## 2013-12-17 MED ORDER — BUPIVACAINE HCL (PF) 0.25 % IJ SOLN
INTRAMUSCULAR | Status: AC
  Filled 2013-12-17: qty 30

## 2013-12-17 MED ORDER — PRENATAL MULTIVITAMIN CH
1.0000 | ORAL_TABLET | Freq: Every day | ORAL | Status: DC
  Administered 2013-12-18 – 2013-12-19 (×2): 1 via ORAL
  Filled 2013-12-17 (×2): qty 1

## 2013-12-17 MED ORDER — SCOPOLAMINE 1 MG/3DAYS TD PT72
1.0000 | MEDICATED_PATCH | Freq: Once | TRANSDERMAL | Status: DC
  Administered 2013-12-17: 1.5 mg via TRANSDERMAL

## 2013-12-17 MED ORDER — LACTATED RINGERS IV SOLN
INTRAVENOUS | Status: DC
  Administered 2013-12-17 (×2): via INTRAVENOUS

## 2013-12-17 MED ORDER — ONDANSETRON HCL 4 MG/2ML IJ SOLN
INTRAMUSCULAR | Status: AC
  Filled 2013-12-17: qty 2

## 2013-12-17 MED ORDER — KETOROLAC TROMETHAMINE 30 MG/ML IJ SOLN: 30.0000 mg | Freq: Four times a day (QID) | INTRAMUSCULAR | Status: AC | PRN

## 2013-12-17 MED ORDER — PHENYLEPHRINE 8 MG IN D5W 100 ML (0.08MG/ML) PREMIX OPTIME
INJECTION | INTRAVENOUS | Status: AC
  Filled 2013-12-17: qty 100

## 2013-12-17 MED ORDER — DIPHENHYDRAMINE HCL 50 MG/ML IJ SOLN: 12.5000 mg | INTRAMUSCULAR | Status: DC | PRN

## 2013-12-17 MED ORDER — BUPIVACAINE HCL (PF) 0.25 % IJ SOLN
INTRAMUSCULAR | Status: DC | PRN
  Administered 2013-12-17: 10 mL

## 2013-12-17 MED ORDER — SODIUM CHLORIDE 0.9 % IJ SOLN: 3.0000 mL | INTRAMUSCULAR | Status: DC | PRN

## 2013-12-17 MED ORDER — SIMETHICONE 80 MG PO CHEW
80.0000 mg | CHEWABLE_TABLET | ORAL | Status: DC
  Administered 2013-12-18 – 2013-12-19 (×2): 80 mg via ORAL
  Filled 2013-12-17 (×3): qty 1

## 2013-12-17 MED ORDER — DEXAMETHASONE SODIUM PHOSPHATE 10 MG/ML IJ SOLN
INTRAMUSCULAR | Status: DC | PRN
  Administered 2013-12-17: 5 mg via INTRAVENOUS

## 2013-12-17 MED ORDER — IBUPROFEN 600 MG PO TABS
600.0000 mg | ORAL_TABLET | Freq: Four times a day (QID) | ORAL | Status: DC
  Administered 2013-12-17 – 2013-12-20 (×10): 600 mg via ORAL
  Filled 2013-12-17 (×10): qty 1

## 2013-12-17 MED ORDER — PHENYLEPHRINE 8 MG IN D5W 100 ML (0.08MG/ML) PREMIX OPTIME
INJECTION | INTRAVENOUS | Status: DC | PRN
  Administered 2013-12-17: 60 ug/min via INTRAVENOUS

## 2013-12-17 MED ORDER — NALBUPHINE HCL 10 MG/ML IJ SOLN
5.0000 mg | INTRAMUSCULAR | Status: DC | PRN
  Administered 2013-12-17: 10 mg via SUBCUTANEOUS

## 2013-12-17 MED ORDER — ZOLPIDEM TARTRATE 5 MG PO TABS: 5.0000 mg | ORAL_TABLET | Freq: Every evening | ORAL | Status: DC | PRN

## 2013-12-17 MED ORDER — SENNOSIDES-DOCUSATE SODIUM 8.6-50 MG PO TABS
2.0000 | ORAL_TABLET | ORAL | Status: DC
  Administered 2013-12-18 – 2013-12-19 (×2): 2 via ORAL
  Filled 2013-12-17 (×3): qty 2

## 2013-12-17 MED ORDER — OXYTOCIN 10 UNIT/ML IJ SOLN
INTRAMUSCULAR | Status: AC
  Filled 2013-12-17: qty 4

## 2013-12-17 MED ORDER — IBUPROFEN 600 MG PO TABS: 600.0000 mg | ORAL_TABLET | Freq: Four times a day (QID) | ORAL | Status: DC | PRN

## 2013-12-17 MED ORDER — SCOPOLAMINE 1 MG/3DAYS TD PT72
MEDICATED_PATCH | TRANSDERMAL | Status: AC
  Administered 2013-12-17: 1.5 mg via TRANSDERMAL
  Filled 2013-12-17: qty 1

## 2013-12-17 MED ORDER — LANOLIN HYDROUS EX OINT: 1.0000 "application " | TOPICAL_OINTMENT | CUTANEOUS | Status: DC | PRN

## 2013-12-17 MED ORDER — ONDANSETRON HCL 4 MG/2ML IJ SOLN
INTRAMUSCULAR | Status: DC | PRN
Start: 2013-12-17 — End: 2013-12-17
  Administered 2013-12-17: 4 mg via INTRAVENOUS

## 2013-12-17 MED ORDER — MENTHOL 3 MG MT LOZG: 1.0000 | LOZENGE | OROMUCOSAL | Status: DC | PRN

## 2013-12-17 MED ORDER — INSULIN ASPART 100 UNIT/ML ~~LOC~~ SOLN
5.0000 [IU] | Freq: Every day | SUBCUTANEOUS | Status: DC
  Administered 2013-12-18: 5 [IU] via SUBCUTANEOUS

## 2013-12-17 MED ORDER — SIMETHICONE 80 MG PO CHEW: 80.0000 mg | CHEWABLE_TABLET | ORAL | Status: DC | PRN

## 2013-12-17 MED ORDER — OXYCODONE-ACETAMINOPHEN 5-325 MG PO TABS
1.0000 | ORAL_TABLET | ORAL | Status: DC | PRN
  Administered 2013-12-18 (×2): 1 via ORAL
  Administered 2013-12-18: 2 via ORAL
  Filled 2013-12-17 (×2): qty 1
  Filled 2013-12-17: qty 2

## 2013-12-17 MED ORDER — INSULIN ASPART 100 UNIT/ML ~~LOC~~ SOLN
9.0000 [IU] | Freq: Every day | SUBCUTANEOUS | Status: DC
Start: 2013-12-17 — End: 2013-12-19
  Administered 2013-12-17 – 2013-12-18 (×2): 9 [IU] via SUBCUTANEOUS

## 2013-12-17 MED ORDER — CEFAZOLIN SODIUM-DEXTROSE 2-3 GM-% IV SOLR
INTRAVENOUS | Status: AC
  Filled 2013-12-17: qty 50

## 2013-12-17 MED ORDER — BISACODYL 10 MG RE SUPP: 10.0000 mg | Freq: Every day | RECTAL | Status: DC | PRN

## 2013-12-17 MED ORDER — FLEET ENEMA 7-19 GM/118ML RE ENEM: 1.0000 | ENEMA | Freq: Every day | RECTAL | Status: DC | PRN

## 2013-12-17 MED ORDER — ONDANSETRON HCL 4 MG/2ML IJ SOLN: 4.0000 mg | INTRAMUSCULAR | Status: DC | PRN

## 2013-12-17 MED ORDER — LACTATED RINGERS IV SOLN
Freq: Once | INTRAVENOUS | Status: AC
  Administered 2013-12-17: 12:00:00 via INTRAVENOUS

## 2013-12-17 MED ORDER — MEDROXYPROGESTERONE ACETATE 150 MG/ML IM SUSP
150.0000 mg | INTRAMUSCULAR | Status: AC | PRN
  Administered 2013-12-20: 150 mg via INTRAMUSCULAR
  Filled 2013-12-17: qty 1

## 2013-12-17 MED ORDER — NALOXONE HCL 1 MG/ML IJ SOLN: 1.0000 ug/kg/h | INTRAVENOUS | Status: DC | PRN

## 2013-12-17 MED ORDER — HYDROMORPHONE HCL PF 1 MG/ML IJ SOLN: 0.2500 mg | INTRAMUSCULAR | Status: DC | PRN

## 2013-12-17 MED ORDER — NALOXONE HCL 0.4 MG/ML IJ SOLN: 0.4000 mg | INTRAMUSCULAR | Status: DC | PRN

## 2013-12-17 MED ORDER — METOCLOPRAMIDE HCL 5 MG/ML IJ SOLN: 10.0000 mg | Freq: Three times a day (TID) | INTRAMUSCULAR | Status: DC | PRN

## 2013-12-17 MED ORDER — INSULIN DETEMIR 100 UNIT/ML ~~LOC~~ SOLN
60.0000 [IU] | Freq: Every day | SUBCUTANEOUS | Status: DC
  Administered 2013-12-17: 60 [IU] via SUBCUTANEOUS
  Administered 2013-12-18: 30 [IU] via SUBCUTANEOUS
  Filled 2013-12-17 (×3): qty 0.6

## 2013-12-17 MED ORDER — NALBUPHINE HCL 10 MG/ML IJ SOLN
INTRAMUSCULAR | Status: AC
  Administered 2013-12-17: 10 mg via SUBCUTANEOUS
  Filled 2013-12-17: qty 1

## 2013-12-17 MED ORDER — NALBUPHINE HCL 10 MG/ML IJ SOLN
5.0000 mg | INTRAMUSCULAR | Status: DC | PRN
  Administered 2013-12-17: 10 mg via INTRAVENOUS
  Filled 2013-12-17: qty 1

## 2013-12-17 MED ORDER — ONDANSETRON HCL 4 MG/2ML IJ SOLN: 4.0000 mg | Freq: Three times a day (TID) | INTRAMUSCULAR | Status: DC | PRN

## 2013-12-17 SURGICAL SUPPLY — 39 items
BARRIER ADHS 3X4 INTERCEED (GAUZE/BANDAGES/DRESSINGS) ×2 IMPLANT
BENZOIN TINCTURE PRP APPL 2/3 (GAUZE/BANDAGES/DRESSINGS) IMPLANT
CLAMP CORD UMBIL (MISCELLANEOUS) IMPLANT
CLOTH BEACON ORANGE TIMEOUT ST (SAFETY) ×2 IMPLANT
CONTAINER PREFILL 10% NBF 15ML (MISCELLANEOUS) IMPLANT
DRAPE LG THREE QUARTER DISP (DRAPES) IMPLANT
DRSG OPSITE POSTOP 4X10 (GAUZE/BANDAGES/DRESSINGS) ×2 IMPLANT
DURAPREP 26ML APPLICATOR (WOUND CARE) ×2 IMPLANT
ELECT REM PT RETURN 9FT ADLT (ELECTROSURGICAL) ×2
ELECTRODE REM PT RTRN 9FT ADLT (ELECTROSURGICAL) ×1 IMPLANT
EXTRACTOR VACUUM M CUP 4 TUBE (SUCTIONS) IMPLANT
GLOVE BIOGEL PI IND STRL 7.0 (GLOVE) ×1 IMPLANT
GLOVE BIOGEL PI INDICATOR 7.0 (GLOVE) ×1
GLOVE ECLIPSE 6.5 STRL STRAW (GLOVE) ×2 IMPLANT
GOWN STRL REUS W/TWL LRG LVL3 (GOWN DISPOSABLE) ×4 IMPLANT
KIT ABG SYR 3ML LUER SLIP (SYRINGE) IMPLANT
NEEDLE HYPO 25X1 1.5 SAFETY (NEEDLE) ×2 IMPLANT
NEEDLE HYPO 25X5/8 SAFETYGLIDE (NEEDLE) IMPLANT
NS IRRIG 1000ML POUR BTL (IV SOLUTION) ×2 IMPLANT
PACK C SECTION WH (CUSTOM PROCEDURE TRAY) ×2 IMPLANT
PAD OB MATERNITY 4.3X12.25 (PERSONAL CARE ITEMS) ×2 IMPLANT
RTRCTR C-SECT PINK 25CM LRG (MISCELLANEOUS) IMPLANT
STAPLER VISISTAT 35W (STAPLE) ×2 IMPLANT
STRIP CLOSURE SKIN 1/2X4 (GAUZE/BANDAGES/DRESSINGS) IMPLANT
SUT CHROMIC GUT AB #0 18 (SUTURE) IMPLANT
SUT MNCRL 0 VIOLET CTX 36 (SUTURE) ×3 IMPLANT
SUT MON AB 4-0 PS1 27 (SUTURE) ×2 IMPLANT
SUT MONOCRYL 0 CTX 36 (SUTURE) ×3
SUT PLAIN 2 0 (SUTURE)
SUT PLAIN 2 0 XLH (SUTURE) IMPLANT
SUT PLAIN ABS 2-0 CT1 27XMFL (SUTURE) IMPLANT
SUT VIC AB 0 CT1 36 (SUTURE) ×4 IMPLANT
SUT VIC AB 2-0 CT1 27 (SUTURE) ×1
SUT VIC AB 2-0 CT1 TAPERPNT 27 (SUTURE) ×1 IMPLANT
SUT VIC AB 4-0 PS2 27 (SUTURE) IMPLANT
SYR CONTROL 10ML LL (SYRINGE) ×2 IMPLANT
TOWEL OR 17X24 6PK STRL BLUE (TOWEL DISPOSABLE) ×2 IMPLANT
TRAY FOLEY CATH 14FR (SET/KITS/TRAYS/PACK) ×2 IMPLANT
WATER STERILE IRR 1000ML POUR (IV SOLUTION) ×2 IMPLANT

## 2013-12-17 NOTE — Anesthesia Preprocedure Evaluation (Signed)
Anesthesia Evaluation  Patient identified by MRN, date of birth, ID band Patient awake    Reviewed: Allergy & Precautions, H&P , NPO status , Patient's Chart, lab work & pertinent test results  Airway Mallampati: II TM Distance: >3 FB Neck ROM: full    Dental no notable dental hx.    Pulmonary neg pulmonary ROS,    Pulmonary exam normal       Cardiovascular hypertension, Pt. on medications     Neuro/Psych negative neurological ROS  negative psych ROS   GI/Hepatic Neg liver ROS, GERD-  Medicated and Controlled,  Endo/Other  negative endocrine ROSdiabetes  Renal/GU negative Renal ROS  negative genitourinary   Musculoskeletal   Abdominal Normal abdominal exam  (+)   Peds  Hematology negative hematology ROS (+)   Anesthesia Other Findings   Reproductive/Obstetrics (+) Pregnancy                           Anesthesia Physical Anesthesia Plan  ASA: II  Anesthesia Plan: Spinal   Post-op Pain Management:    Induction:   Airway Management Planned:   Additional Equipment:   Intra-op Plan:   Post-operative Plan:   Informed Consent: I have reviewed the patients History and Physical, chart, labs and discussed the procedure including the risks, benefits and alternatives for the proposed anesthesia with the patient or authorized representative who has indicated his/her understanding and acceptance.     Plan Discussed with: CRNA and Surgeon  Anesthesia Plan Comments:         Anesthesia Quick Evaluation

## 2013-12-17 NOTE — Transfer of Care (Signed)
Immediate Anesthesia Transfer of Care Note  Patient: Cindy Long  Procedure(s) Performed: Procedure(s) with comments: Repeat CESAREAN SECTION with Cerclage Removal (N/A) - EDD: 12/22/13  Patient Location: PACU  Anesthesia Type:Spinal  Level of Consciousness: awake, alert  and oriented  Airway & Oxygen Therapy: Patient Spontanous Breathing  Post-op Assessment: Report given to PACU RN and Post -op Vital signs reviewed and stable  Post vital signs: Reviewed and stable  Complications: No apparent anesthesia complications

## 2013-12-17 NOTE — Anesthesia Postprocedure Evaluation (Signed)
Anesthesia Post Note  Patient: Cindy Long  Procedure(s) Performed: Procedure(s) (LRB): Repeat CESAREAN SECTION with Cerclage Removal (N/A)  Anesthesia type: Spinal  Patient location: PACU  Post pain: Pain level controlled  Post assessment: Post-op Vital signs reviewed  Last Vitals:  Filed Vitals:   12/17/13 1500  BP:   Pulse:   Temp:   Resp: 20    Post vital signs: Reviewed  Level of consciousness: awake  Complications: No apparent anesthesia complications

## 2013-12-17 NOTE — Anesthesia Procedure Notes (Signed)
Spinal  Patient location during procedure: OR Start time: 12/17/2013 1:15 PM End time: 12/17/2013 1:17 PM Staffing Anesthesiologist: Lyn Hollingshead Performed by: anesthesiologist  Preanesthetic Checklist Completed: patient identified, surgical consent, pre-op evaluation, timeout performed, IV checked, risks and benefits discussed and monitors and equipment checked Spinal Block Patient position: sitting Prep: DuraPrep Patient monitoring: heart rate, cardiac monitor, continuous pulse ox and blood pressure Approach: midline Location: L3-4 Injection technique: single-shot Needle Needle type: Sprotte  Needle gauge: 24 G Needle length: 9 cm Needle insertion depth: 6 cm Assessment Sensory level: T4

## 2013-12-17 NOTE — Lactation Note (Signed)
This note was copied from the chart of [facility]. Lactation Consultation Note  Patient Name: Cindy Long Date: 12/17/2013 Reason for consult: Initial assessment of this second-time mother and her newborn at 3 hours of age.  Baby weighed 10 lb 10.5 oz but OT results are within normal limits and baby has already breastfed several times since birth, with most recent LATCH score=7.  Mom is sleepy and NT came in to assess baby prior to bath.  LC encouraged STS and cue feedings and discussed how STS stimulates milk production and helps baby adjust to life outside the womb. LC encouraged review of Baby and Me pp 9, 14 and 20-25 for STS and BF information. LC provided [employer] brochure and reviewed [facility] services and list of community and web site resources.     Maternal Data Infant to breast within first hour of birth: Yes (nursed for 20 minutes in PACU) Has patient been taught Hand Expression?: Yes (mom informs LC that her nurse has shown her hand expression) Does the patient have breastfeeding experience prior to this delivery?: Yes  Feeding Feeding Type: Breast Fed Length of feed: 30 min  LATCH Score/Interventions Latch: Grasps breast easily, tongue down, lips flanged, rhythmical sucking. Intervention(s): Breast compression;Breast massage;Assist with latch;Adjust position  Audible Swallowing: None Intervention(s): Skin to skin;Hand expression  Type of Nipple: Everted at rest and after stimulation  Comfort (Breast/Nipple): Soft / non-tender     Hold (Positioning): Assistance needed to correctly position infant at breast and maintain latch. Intervention(s): Breastfeeding basics reviewed;Support Pillows;Position options;Skin to skin  LATCH Score: 7 (RN assessment)  Lactation Tools Discussed/Used   STS, cue feedings, hand expression  Consult Status Consult Status: Follow-up Date: 12/18/13 Follow-up type: In-patient    [name]  [facility] 12/17/2013, 9:47 PM

## 2013-12-17 NOTE — Brief Op Note (Signed)
12/17/2013  2:41 PM  PATIENT:  Cindy Long  38 y.o. female  PRE-OPERATIVE DIAGNOSIS:  Previous Cesarean Section, Class B Diabetes, Chronic Hypertension, Cervical Incompetence , term gestation  POST-OPERATIVE DIAGNOSIS:  Same, retained cerclage, fetal macrosomia  PROCEDURE:  Repeat cesarean section, kerr hysterotomy  SURGEON:  Surgeon(s) and Role:    * Jaekwon Mcclune A Tiwanna Tuch, MD - Primary  PHYSICIAN ASSISTANT:   ASSISTANTS: Julianne Handler, CNM   ANESTHESIA:   spinal FINDINGS: copious clear AF, live female ROT w/ loop of cord in field, 10lb 10 oz, nl tubes, enlarged ovaries bilateral c/w PCO, Apgar 9/9 EBL:  Total I/O In: 1100 [I.V.:1100] Out: 875 [Urine:75; Blood:800]  BLOOD ADMINISTERED:none  DRAINS: none   LOCAL MEDICATIONS USED:  MARCAINE     SPECIMEN:  Source of Specimen:  placenta not sent  DISPOSITION OF SPECIMEN:  N/A  COUNTS:  YES  TOURNIQUET:  * No tourniquets in log *  DICTATION: .Other Dictation: Dictation Number [id number]  PLAN OF CARE: Admit to inpatient   PATIENT DISPOSITION:  PACU - hemodynamically stable.   Delay start of Pharmacological VTE agent (>24hrs) due to surgical blood loss or risk of bleeding: no

## 2013-12-18 ENCOUNTER — Encounter (HOSPITAL_COMMUNITY): Payer: Self-pay | Admitting: Obstetrics and Gynecology

## 2013-12-18 LAB — GLUCOSE, CAPILLARY
Glucose-Capillary: 102 mg/dL — ABNORMAL HIGH (ref 70–99)
Glucose-Capillary: 93 mg/dL (ref 70–99)
Glucose-Capillary: 68 mg/dL — ABNORMAL LOW (ref 70–99)
Glucose-Capillary: 48 mg/dL — ABNORMAL LOW (ref 70–99)
Glucose-Capillary: 50 mg/dL — ABNORMAL LOW (ref 70–99)
Glucose-Capillary: 80 mg/dL (ref 70–99)

## 2013-12-18 LAB — CBC
HCT: 30.3 % — ABNORMAL LOW (ref 36.0–46.0)
Hemoglobin: 10.1 g/dL — ABNORMAL LOW (ref 12.0–15.0)
MCH: 23.7 pg — ABNORMAL LOW (ref 26.0–34.0)
MCHC: 33.3 g/dL (ref 30.0–36.0)
MCV: 71.1 fL — ABNORMAL LOW (ref 78.0–100.0)
Platelets: 211 10*3/uL (ref 150–400)
RBC: 4.26 MIL/uL (ref 3.87–5.11)
RDW: 20.9 % — ABNORMAL HIGH (ref 11.5–15.5)
WBC: 10.2 10*3/uL (ref 4.0–10.5)

## 2013-12-18 LAB — CCBB MATERNAL DONOR DRAW

## 2013-12-18 LAB — BIRTH TISSUE RECOVERY COLLECTION (PLACENTA DONATION)

## 2013-12-18 MED ORDER — INSULIN DETEMIR 100 UNIT/ML ~~LOC~~ SOLN: 30.0000 [IU] | Freq: Once | SUBCUTANEOUS | Status: DC

## 2013-12-18 NOTE — Anesthesia Postprocedure Evaluation (Signed)
  Anesthesia Post-op Note  Patient: Cindy Long  Procedure(s) Performed: Procedure(s) with comments: Repeat CESAREAN SECTION with Cerclage Removal (N/A) - EDD: 12/22/13  Patient Location: Mother/Baby  Anesthesia Type:Spinal  Level of Consciousness: awake  Airway and Oxygen Therapy: Patient Spontanous Breathing  Post-op Pain: none  Post-op Assessment: Patient's Cardiovascular Status Stable, Respiratory Function Stable, Patent Airway, No signs of Nausea or vomiting, Adequate PO intake, Pain level controlled, No headache, No backache, No residual numbness and No residual motor weakness  Post-op Vital Signs: Reviewed and stable  Complications: No apparent anesthesia complications

## 2013-12-18 NOTE — Addendum Note (Signed)
Addendum created 12/18/13 1033 by Tobin Chad, CRNA   Modules edited: Notes Section   Notes Section:  File: [id number]

## 2013-12-18 NOTE — Op Note (Signed)
Cindy Long, Cindy Long            ACCOUNT NO.:  [id number]  MEDICAL RECORD NO.:  [id number]  LOCATION:  9125                          FACILITY:  [facility]  PHYSICIAN:  Servando Salina, M.D.DATE OF BIRTH:  [birth year]  DATE OF PROCEDURE:  12/17/2013 DATE OF DISCHARGE:                              OPERATIVE REPORT   PREOPERATIVE DIAGNOSES:  Previous cesarean section, term gestation, class B diabetes, chronic hypertension, cervical incompetence, fetal macrosomia.  PROCEDURE:  Repeat cesarean section, Kerr hysterotomy.  POSTOPERATIVE DIAGNOSES:  Previous cesarean section, term gestation, fetal macrosomia, class B diabetes, chronic hypertension, cervical incompetence, retained cerclage.  ANESTHESIA:  Spinal.  SURGEON:  Servando Salina, MD.  ASSISTANTJulianne Handler, CNM.  PROCEDURE:  Under adequate spinal anesthesia, the patient was placed in the supine position with a left lateral tilt.  She was sterilely prepped and draped in the usual fashion.  An indwelling Foley catheter was sterilely placed.  Marcaine 0.25% was injected along the previous Pfannenstiel skin incision.  Pfannenstiel skin incision was then made, carried down to the rectus fascia with cautery.  The rectus fascia was opened transversely, and rectus fascia was then sharply and bluntly dissected off the rectus muscle in the superior and inferior fashion. As this process was being done, the rectus muscle was already separated and the parietal peritoneum could be seen.  This was then easily opened and extended superiorly and inferiorly.  A well-developed lower uterine segment was noted with thin lower uterine segment.  Bladder retractor was placed.  A curvilinear low-transverse incision was then made and extended with bandage scissors.  Careful dissection of the bladder was done off of the lower uterine segment and displaced further with the bladder retractor.  A curvilinear low-transverse uterine incision  was then made and extended with bandage scissors.  Artificial rupture of membranes, copious clear amniotic fluid was noted.  A floating vertex was encountered resulting in the need to use a vacuum to assist with the delivery with the baby from right occiput transverse position.  Cord was looped in the field at the time of the delivery.  Subsequently, the baby was delivered.  Cord was clamped, cut.  The baby was transferred to the awaiting pediatricians who assigned Apgars of 9 and 9 at 1 and 5 minutes.  The placenta was manually removed and given for cord bank donation.  The placenta was not sent to Pathology.  The uterine cavity was cleaned of debris.  Normal tubes and ovaries were noted bilaterally. Both ovaries were somewhat polycystic.  The uterine incision had no extension.  It was carefully closed with 0 Monocryl running locked stitch for first layer, second layer imbricating with 0 Monocryl sutures.  The abdomen was then copiously irrigated and suctioned of debris.  The lower uterine segment was well approximated.  Interceed was placed over the lower uterine segment.  Parietal peritoneum was then closed with 2-0 Vicryl.  The rectus fascia was closed with 0 Vicryl x2. The subcutaneous area was irrigated, small bleeders cauterized. Interrupted 2-0 plain sutures placed and the skin approximated with Ethicon staples.  Once the incision was covered, the patient was placed in a frog-leg position, and the digital exam reveals a cervix to be  very anterior, but what appeared to be possible the cerclage suture was still present.  Attempted cerclage removal previously left the knot only in an earlier visit in emergency room by Dr. Benjie Karvonen.  The speculum was placed in the vagina and after several attempts, the cervix which was grasped with a ring clamp in 2 places,a nubbing of what appears to be where the knot would be was explored, but again the suture could not be found and at that point  it was deemed better to not try to cut the cervix in order to get the cerclage out at this point.  The procedure was therefore aborted.  The patient was informed and the specimen was placenta not sent to Pathology.  ESTIMATED BLOOD LOSS:  800 mL.  INTRAOPERATIVE FLUID:  2 L.  URINE OUTPUT:  275 mL.  COUNTS:  Sponge and instrument counts x2 was correct.  COMPLICATION:  None.  The weight of the baby was 10 pounds 10 ounces.  The patient tolerated the procedure well, was transferred to Recovery in stable condition.     Servando Salina, M.D.     [place]/MEDQ  D:  12/17/2013  T:  12/18/2013  Job:  [id number]

## 2013-12-18 NOTE — Progress Notes (Signed)
Patient ID: Cindy Long, female   DOB: 03-08-1976, 38 y.o.   MRN: [id number] POD # 1  Subjective: Pt reports feeling well, but c/o significant, generalized itching over torso and arms / Pain controlled with intraop medications; has not taken any percocet Tolerating po/ Foley dc'ed and has voided x 1, 300 cc/ No n/v/Flatus neg Activity: out of bed and ambulate Bleeding is light Newborn info:  Information for the patient's newborn:  Kashauna, Celmer [[id number]]  female Feeding: breast   Objective: VS: Blood pressure 121/64, pulse 59, temperature 97.8 F (36.6 C), temperature source Oral, resp. rate 18.    Intake/Output Summary (Last 24 hours) at 12/18/13 0950 Last data filed at 12/18/13 4128  Gross per 24 hour  Intake 3111.67 ml  Output   2575 ml  Net 536.67 ml      Recent Labs  12/16/13 1000 12/18/13 0550  WBC 6.1 10.2  HGB 11.4* 10.1*  HCT 34.8* 30.3*  PLT 226 211    Blood type: A POS Rubella: Immune    Physical Exam:  General: alert, cooperative and no distress CV: Regular rate and rhythm Resp: clear Abdomen: soft, NT, hypoactive BS x 4 quads Incision: Covered with Tegaderm and honeycomb dressing; well approximated Uterine Fundus: firm, below umbilicus, nontender Lochia: minimal Ext: Homans sign is negative, no sign of DVT and no edema, redness or tenderness in the calves or thighs    A/P: POD # 1/ G2P2002 S/P Elective repeat C/Section d/t Hx IDDM, cHTN, macro Glucose readings stable; remains on insulin\ BP readings stable and no antiHTN meds ordered at this time Benedryl for itching Doing well Continue routine post op orders   Signed: Darleen Crocker, MSN, [facility] 12/18/2013, 9:50 AM

## 2013-12-19 LAB — GLUCOSE, CAPILLARY
Glucose-Capillary: 52 mg/dL — ABNORMAL LOW (ref 70–99)
Glucose-Capillary: 66 mg/dL — ABNORMAL LOW (ref 70–99)
Glucose-Capillary: 84 mg/dL (ref 70–99)

## 2013-12-19 MED ORDER — INSULIN DETEMIR 100 UNIT/ML ~~LOC~~ SOLN
30.0000 [IU] | Freq: Every day | SUBCUTANEOUS | Status: DC
  Administered 2013-12-20: 30 [IU] via SUBCUTANEOUS
  Filled 2013-12-19: qty 0.3

## 2013-12-19 NOTE — Lactation Note (Signed)
This note was copied from the chart of Cindy [facility]. Lactation Consultation Note  Patient Name: Cindy Long Date: 12/19/2013 Reason for consult: Follow-up assessment;Other (Comment) (LGA with stable blood sugars) and exclusive breastfeeding until last night when mom started offering some formula after breastfeedings and today, has fed multiple formula bottles, up to 73 ml's per feeding.  Mom states baby is latching well and swallowing and most recent LATCH score per RN assessment was "8". Mom says her breasts are starting to feel fuller this evening and LC cautioned that too much formula may lead to decreased milk production, engorgement, sore nipples and encouraged mm to decrease supplement and nurse frequently on cue.  Baby had 4 voids and 4 stools prior to starting supplement, which was within guidelines for hour of life.  Since formula started, baby has had 4 voids but no stools since 2230 on 12/18/2013.   Maternal Data    Feeding    LATCH Score/Interventions         No LATCH scores today; most recent LATCH score=8 last evening             Lactation Tools Discussed/Used   Cue feedings at breast and minimal supplement LEAD cautions for supplementation  Consult Status Consult Status: Follow-up Date: 12/20/13 Follow-up type: In-patient    [name] [facility] 12/19/2013, 10:54 PM

## 2013-12-19 NOTE — Progress Notes (Addendum)
POD # 2  Subjective: Pt reports feeling well/ Pain controlled with Motrin and Percocet Tolerating po/Voiding without problems/ No n/v/ Flatus present Activity: ad lib Bleeding is light Newborn info:  Information for the patient's newborn:  Gloris, Shiroma [[id number]]  female Feeding: formula   Objective: VS:  Filed Vitals:   12/18/13 1420 12/18/13 1642 12/19/13 0556 12/19/13 0654  BP: 129/78 128/68 152/85 121/72  Pulse: 71 73 80 74  Temp: 97.9 F (36.6 C) 97.3 F (36.3 C) 97.8 F (36.6 C)   TempSrc: Oral Oral Oral   Resp: 18 19 18    Weight:      SpO2: 95%       I&O: Intake/Output     02/11 0701 - 02/12 0700 02/12 0701 - 02/13 0700   P.O. 720    I.V. (mL/kg)     Total Intake(mL/kg) 720 (8.4)    Urine (mL/kg/hr) 1500 (0.7)    Blood     Total Output 1500     Net -780            LABS:  Recent Labs  12/18/13 0550  WBC 10.2  HGB 10.1*  HCT 30.3*  PLT 211    Blood type: --/--/A POS (02/09 1000) Rubella: Immune (07/07 0000)     Physical Exam:  General: alert and cooperative CV: Regular rate and rhythm Resp: CTA bilaterally Abdomen: soft, nontender, normal bowel sounds Uterine Fundus: firm, below umbilicus, nontender Incision: Covered with Tegaderm and honeycomb dressing; well approximated, scant drainage. Lochia: minimal Ext: extremities normal, atraumatic, no cyanosis or edema and Homans sign is negative, no sign of DVT    Assessment/: POD # 2/ G2P2002/ S/P C/Section d/t repeat IDDM CHTN-stable Doing well  Plan: Continue routine post op orders Fasting and AC BS low, will hold mealtime insulin.  Made need to adjust HS Levemir.  Follow BS closely Anticipate discharge home tomorrow   Signed: Graciela Husbands, MSN, CNM 12/19/2013, 11:09 AM

## 2013-12-19 NOTE — Progress Notes (Signed)
Pt's CBG=52 before breakfast.  Pt scheduled to receive 3 units of Novolog and 1000mg  of Metformin.  Phoned Dr. Benjie Karvonen with above information.  Dr. Benjie Karvonen instructed RN to give the Metformin and hold Novolog with this meal.

## 2013-12-20 LAB — GLUCOSE, CAPILLARY
Glucose-Capillary: 138 mg/dL — ABNORMAL HIGH (ref 70–99)
Glucose-Capillary: 46 mg/dL — ABNORMAL LOW (ref 70–99)
Glucose-Capillary: 57 mg/dL — ABNORMAL LOW (ref 70–99)
Glucose-Capillary: 93 mg/dL (ref 70–99)

## 2013-12-20 MED ORDER — DOCUSATE SODIUM 100 MG PO CAPS: 100.0000 mg | ORAL_CAPSULE | Freq: Two times a day (BID) | ORAL | Status: AC | PRN

## 2013-12-20 MED ORDER — FERROUS SULFATE 325 (65 FE) MG PO TABS: 325.0000 mg | ORAL_TABLET | Freq: Two times a day (BID) | ORAL | Status: DC

## 2013-12-20 MED ORDER — IBUPROFEN 600 MG PO TABS: 600.0000 mg | ORAL_TABLET | Freq: Four times a day (QID) | ORAL | Status: DC | PRN

## 2013-12-20 MED ORDER — OXYCODONE-ACETAMINOPHEN 5-325 MG PO TABS: 1.0000 | ORAL_TABLET | ORAL | Status: DC | PRN

## 2013-12-20 NOTE — Plan of Care (Signed)
Hypoglycemic Event  CBG: 46  Treatment: 30 grams of carbs  Symptoms: pt reports none  Follow-up CBG: Time:1020 CBG Result:93  Possible Reasons for Event: changing needs, breastfeeding, insulin dosage  Comments/MD notified: Gustavo Lah, NP- will consult pt with new plan with discharge. Held morning dose of insulin    Cindy Long, Cindy Long  Remember to initiate Hypoglycemia Order Set & complete

## 2013-12-20 NOTE — Discharge Summary (Signed)
Obstetric Discharge Summary Reason for Admission: G2  P1 0 0 1 for elective repeat C/S; hx cHTN and IDDM; fetal macrosomia, cervical incompetence Prenatal Procedures: NST and ultrasound, cerclage, BPP Intrapartum Procedures: cesarean: low cervical, transverse Postpartum Procedures: none Complications-Operative and Postpartum: none Hemoglobin  Date Value Ref Range Status  12/18/2013 10.1* 12.0 - 15.0 g/dL Final     HCT  Date Value Ref Range Status  12/18/2013 30.3* 36.0 - 46.0 % Final    Physical Exam:  General: alert, cooperative and no distress Lochia: appropriate Uterine Fundus: firm Incision: Covered with Tegaderm and honeycomb dressing; well approximated.  DVT Evaluation: No evidence of DVT seen on physical exam. Negative Homan's sign.  Discharge Diagnoses: G2 P2 s/p repeat C/S; hx cHTN, stable and no meds at present.  Hx IDDM; will decrease HS insulin and cont metformin  Discharge Information: Date: 12/20/2013 Activity: pelvic rest Diet: routine Medications: PNV, Ibuprofen, Colace, Percocet and metformin and insulin Condition: stable Instructions: refer to practice specific booklet Discharge to: home Follow-up Information   Follow up with Shaneece Stockburger A, MD In 6 weeks. (Staple removal in the office on 12/24/13)    Specialty:  Obstetrics and Gynecology   Contact information:   [address] [place] [address] [phone]       Newborn Data: Live born female on 12/17/13 Birth Weight: 10 lb 10.5 oz (4834 g) APGAR: 9, 9  Home with mother.  Cindy Long,Cindy Long 12/20/2013, 8:38 AM

## 2013-12-20 NOTE — Lactation Note (Addendum)
This note was copied from the chart of Cindy [facility]. Lactation Consultation Note  Patient Name: Cindy Long VZCHY'I Date: 12/20/2013 Reason for consult: Follow-up assessment Baby 47 hours old. Mom reports she wants to BF, but she has been offering formula. Reviewed the risks of offering formula and not putting baby to breast--risks of engorgement and/or low milk supply. Assisted mom to latch baby in football hold. Assisted mom to flange lower lip out. Baby maintained a deep latch and hand several bursts of rhythmic sucking and swallowing. Mom reports no discomfort or pain. Mom able to hand express colostrum. Given a hand pump with instruction, and engorgement prevention/treatment. Referred mom to Baby and Me booklet for information about breast milk storage and number of stools and voids to expect. Mom aware of OP/BFSG services. Mom enc to feed baby with cues.   Maternal Data    Feeding Feeding Type: Breast Fed Length of feed:  (LC saw 10 minutes, still nursing when [facility] left the room.)  LATCH Score/Interventions Latch: Grasps breast easily, tongue down, lips flanged, rhythmical sucking. (Showed mom how to coax bottom lip to flange out. ) Intervention(s): Adjust position;Assist with latch  Audible Swallowing: Spontaneous and intermittent  Type of Nipple: Everted at rest and after stimulation  Comfort (Breast/Nipple): Soft / non-tender     Hold (Positioning): Assistance needed to correctly position infant at breast and maintain latch. Intervention(s): Breastfeeding basics reviewed;Support Pillows;Position options  LATCH Score: 9  Lactation Tools Discussed/Used Tools: Pump Breast pump type: Manual   Consult Status Consult Status: Complete    Inocente Salles 12/20/2013, 11:38 AM

## 2013-12-20 NOTE — Progress Notes (Signed)
Patient ID: Cindy Long, female   DOB: [birth year], 38 y.o.   MRN: [id number] POD # 3  Subjective: Pt reports feeling well and eager for d/c  home/ Pain controlled with ibuprofen and rare percocet Tolerating po/Voiding without problems/ No n/v/Flatus pos Activity: out of bed and ambulate Bleeding is light Newborn info:  Information for the patient's newborn:  Darshay, Deupree [[id number]]  female Feeding: breast and bottle   Objective: VS: Blood pressure 128/78, pulse 83, temperature 97.9 F (36.6 C), temperature source Oral, resp. rate 18.   LABS:  Recent Labs  12/18/13 0550  WBC 10.2  HGB 10.1*  PLT 211                             Physical Exam:  General: alert, cooperative and no distress CV: Regular rate and rhythm Resp: clear Abdomen: soft, nontender, normal bowel sounds Incision: Covered with Tegaderm and honeycomb dressing; well approximated. Uterine Fundus: firm, below umbilicus, nontender Lochia: minimal Ext: Homans sign is negative, no sign of DVT and no edema, redness or tenderness in the calves or thighs    A/P: POD # 3/ G2P2002/ S/P Elective C/Section d/t cHTN and IDDM cHTN stable and no anti HTN meds at this time.  Will check BP at home and return to office on 12/24/13 for BP check and staple removal IDDM; will consult with Dr Garwin Brothers regarding post op insulin regimen Doing well and stable for discharge home RX's: Ibuprofen 600mg  po Q 6 hrs prn pain #30 Refill x 1 Percocet 5/325 1 - 2 tabs po every 6 hrs prn pain  #15 No refill Niferex 150mg  po QD/BID #30/#60 Refill x 1 Colace 100mg  po up to TID prn #30 Ref x 1    Signed: Darleen Crocker, MSN, [facility] 12/20/2013, 8:37 AM

## 2013-12-20 NOTE — Progress Notes (Signed)
Inpatient Diabetes Program Recommendations  AACE/ADA: New Consensus Statement on Inpatient Glycemic Control (2013)  Target Ranges:  Prepandial:   less than 140 mg/dL      Peak postprandial:   less than 180 mg/dL (1-2 hours)      Critically ill patients:  140 - 180 mg/dL   Results for KRIYA, WESTRA (MRN [id number]) as of 12/20/2013 11:52  Ref. Range 12/19/2013 14:56 12/19/2013 22:05 12/20/2013 01:47 12/20/2013 09:37 12/20/2013 10:02 12/20/2013 10:25  Glucose-Capillary Latest Range: 70-99 mg/dL 66 (L) 84 138 (H) 46 (L) 57 (L) 93   Diabetes history: Preexisting DM2 Outpatient Diabetes medications: Prior to pregnancy (noted and recommended by Dr. Cruzita Lederer on 02/05/13) Lantus 20 units QHS, Metformin 1000 mg BID, and Januvia 100 mg daily.  During pregnancy per home medication list, Levemir 70 units QHs, Novolog 3 units with breakfast, Novolog 5 units with lunch, Novolog 9 units with supper, and Metformin 1000 mg BID. Current orders for Inpatient glycemic control: Levemir 30 units QHS, Metformin 1000 mg BID  Inpatient Diabetes Program Recommendations Insulin - Basal: Noted recurrent hypoglycemia despite decreasing basal insulin. Please consider decreasing Lantus to 15 units QHS.  Prior to completing progress note, patient was discharged (was in room 9125 at [facility]).  In looking at the chart, not sure of insulin dosage patient was discharged on. Concerned about risk of hypoglycemia with current insulin regimen. Since patient is post delivery, she will not require as much insulin at this time.  Anise Salvo, RN, Camera operator at [facility] on unit and was told patient had indeed been discharged and that there was not any specific dose of basal insulin on the discharge instructions because Gustavo Lah, NP wanted to discuss with Dr. Garwin Brothers and she would call patient with instructions.  Called Pearletha Forge, NP office and was given her phone number [phone].  Called Gustavo Lah, NP on phone but did not get an answer so  I left message explaining reason for call and requested she call me back at [phone].  Recommend decreasing Lantus to 15 units QHS, have patient monitor CBGs ACHS and 2 am, follow up with Dr. Cruzita Lederer as soon as possible.  Thanks, Barnie Alderman, RN, MSN, CCRN Diabetes Coordinator Inpatient Diabetes Program [phone] (Team Pager) [phone] ([facility] office) [phone] [facility] office)

## 2014-09-08 ENCOUNTER — Encounter (HOSPITAL_COMMUNITY): Payer: Self-pay | Admitting: Obstetrics and Gynecology

## 2014-11-09 ENCOUNTER — Ambulatory Visit (HOSPITAL_COMMUNITY)
Admission: RE | Admit: 2014-11-09 | Discharge: 2014-11-09 | Disposition: A | Discharge status: Home / Self Care | Payer: BC Managed Care – PPO | Source: Ambulatory Visit | Attending: Family Medicine | Admitting: Family Medicine

## 2014-11-09 ENCOUNTER — Other Ambulatory Visit (HOSPITAL_COMMUNITY): Payer: Self-pay | Admitting: Family Medicine

## 2014-11-09 DIAGNOSIS — M79671 Pain in right foot: Secondary | ICD-10-CM

## 2014-11-09 DIAGNOSIS — E119 Type 2 diabetes mellitus without complications: Secondary | ICD-10-CM | POA: Insufficient documentation

## 2015-12-22 ENCOUNTER — Ambulatory Visit: Payer: BLUE CROSS/BLUE SHIELD | Admitting: *Deleted

## 2016-06-01 ENCOUNTER — Emergency Department (HOSPITAL_COMMUNITY): Payer: No Typology Code available for payment source

## 2016-06-01 ENCOUNTER — Emergency Department (HOSPITAL_COMMUNITY)
Admission: EM | Admit: 2016-06-01 | Discharge: 2016-06-01 | Disposition: A | Discharge status: Home / Self Care | Payer: No Typology Code available for payment source | Attending: Emergency Medicine | Admitting: Emergency Medicine

## 2016-06-01 ENCOUNTER — Encounter (HOSPITAL_COMMUNITY): Payer: Self-pay

## 2016-06-01 DIAGNOSIS — R1110 Vomiting, unspecified: Secondary | ICD-10-CM

## 2016-06-01 DIAGNOSIS — E119 Type 2 diabetes mellitus without complications: Secondary | ICD-10-CM | POA: Diagnosis not present

## 2016-06-01 DIAGNOSIS — R1084 Generalized abdominal pain: Secondary | ICD-10-CM

## 2016-06-01 DIAGNOSIS — R110 Nausea: Secondary | ICD-10-CM

## 2016-06-01 DIAGNOSIS — Z794 Long term (current) use of insulin: Secondary | ICD-10-CM | POA: Diagnosis not present

## 2016-06-01 DIAGNOSIS — R197 Diarrhea, unspecified: Secondary | ICD-10-CM | POA: Diagnosis present

## 2016-06-01 DIAGNOSIS — I10 Essential (primary) hypertension: Secondary | ICD-10-CM | POA: Diagnosis not present

## 2016-06-01 DIAGNOSIS — Z7984 Long term (current) use of oral hypoglycemic drugs: Secondary | ICD-10-CM | POA: Diagnosis not present

## 2016-06-01 DIAGNOSIS — R112 Nausea with vomiting, unspecified: Secondary | ICD-10-CM | POA: Insufficient documentation

## 2016-06-01 LAB — CBC
HCT: 43.6 % (ref 36.0–46.0)
Hemoglobin: 14.8 g/dL (ref 12.0–15.0)
MCH: 26.7 pg (ref 26.0–34.0)
MCHC: 33.9 g/dL (ref 30.0–36.0)
MCV: 78.7 fL (ref 78.0–100.0)
Platelets: 272 10*3/uL (ref 150–400)
RBC: 5.54 MIL/uL — ABNORMAL HIGH (ref 3.87–5.11)
RDW: 13.1 % (ref 11.5–15.5)
WBC: 6.2 10*3/uL (ref 4.0–10.5)

## 2016-06-01 LAB — COMPREHENSIVE METABOLIC PANEL
ALT: 13 U/L — ABNORMAL LOW (ref 14–54)
AST: 14 U/L — ABNORMAL LOW (ref 15–41)
Albumin: 3.7 g/dL (ref 3.5–5.0)
Alkaline Phosphatase: 62 U/L (ref 38–126)
Anion gap: 8 (ref 5–15)
BUN: 6 mg/dL (ref 6–20)
CO2: 22 mmol/L (ref 22–32)
Calcium: 9.7 mg/dL (ref 8.9–10.3)
Chloride: 108 mmol/L (ref 101–111)
Creatinine, Ser: 0.64 mg/dL (ref 0.44–1.00)
GFR calc Af Amer: >60 mL/min (ref >60)
GFR calc non Af Amer: >60 mL/min (ref >60)
Glucose, Bld: 243 mg/dL — ABNORMAL HIGH (ref 65–99)
Potassium: 3.9 mmol/L (ref 3.5–5.1)
Sodium: 138 mmol/L (ref 135–145)
Total Bilirubin: 0.7 mg/dL (ref 0.3–1.2)
Total Protein: 7 g/dL (ref 6.5–8.1)

## 2016-06-01 LAB — URINALYSIS, ROUTINE W REFLEX MICROSCOPIC
Bilirubin Urine: NEGATIVE
Glucose, UA: >1000 mg/dL — AB
Hgb urine dipstick: NEGATIVE
Ketones, ur: NEGATIVE mg/dL
Leukocytes, UA: NEGATIVE
Nitrite: NEGATIVE
Protein, ur: NEGATIVE mg/dL
Specific Gravity, Urine: 1.022 (ref 1.005–1.030)
pH: 6 (ref 5.0–8.0)

## 2016-06-01 LAB — URINE MICROSCOPIC-ADD ON: RBC / HPF: NONE SEEN RBC/hpf (ref 0–5)

## 2016-06-01 LAB — I-STAT BETA HCG BLOOD, ED (MC, WL, AP ONLY): I-stat hCG, quantitative: <5 m[IU]/mL (ref <5)

## 2016-06-01 LAB — LIPASE, BLOOD: Lipase: 44 U/L (ref 11–51)

## 2016-06-01 MED ORDER — ONDANSETRON 8 MG PO TBDP: 8.0000 mg | ORAL_TABLET | Freq: Three times a day (TID) | ORAL | 0 refills | Status: DC | PRN

## 2016-06-01 MED ORDER — MORPHINE SULFATE (PF) 4 MG/ML IV SOLN
4.0000 mg | Freq: Once | INTRAVENOUS | Status: AC
  Administered 2016-06-01: 4 mg via INTRAVENOUS
  Filled 2016-06-01: qty 1

## 2016-06-01 MED ORDER — DICYCLOMINE HCL 20 MG PO TABS: 20.0000 mg | ORAL_TABLET | Freq: Three times a day (TID) | ORAL | 0 refills | Status: DC | PRN

## 2016-06-01 MED ORDER — SODIUM CHLORIDE 0.9 % IV BOLUS (SEPSIS)
2000.0000 mL | Freq: Once | INTRAVENOUS | Status: AC
  Administered 2016-06-01: 2000 mL via INTRAVENOUS

## 2016-06-01 MED ORDER — ONDANSETRON HCL 4 MG/2ML IJ SOLN
4.0000 mg | Freq: Once | INTRAMUSCULAR | Status: AC
  Administered 2016-06-01: 4 mg via INTRAVENOUS
  Filled 2016-06-01: qty 2

## 2016-06-01 NOTE — ED Provider Notes (Signed)
[facility] DEPT Provider Note   CSN: [id number] Arrival date & time: 06/01/16  6754  First Provider Contact:  First MD Initiated Contact with Patient 06/01/16 [phone]        History   Chief Complaint Chief Complaint  Patient presents with  . Abdominal Pain    HPI Cindy Long is a 40 y.o. female.  The history is provided by the patient.  Patient presents emergency department with 2 weeks of ongoing epigastric pain and nausea with some diarrhea.  She denies blood in her stool.  She reports some occasional generalized abdominal discomfort and pain.  She denies hematemesis.  She is scheduled to see GI for an endoscopy and colonoscopy in the coming weeks.  This is not been scheduled yet however.  She denies fevers and chills.  She reports mild decreased oral intake.  She does have a history of diabetes.   Past Medical History:  Diagnosis Date  . Diabetes mellitus    IDDM  . GERD (gastroesophageal reflux disease)    no meds currently  . Hypertension   . Mastitis    right breast  . Postpartum care following cesarean delivery (2/10) 12/17/2013    Patient Active Problem List   Diagnosis Date Noted  . Postpartum care following cesarean delivery (2/10) 12/17/2013  . Indication for care in labor or delivery 11/30/2013  . IBS (irritable bowel syndrome) 02/05/2013  . Other and unspecified hyperlipidemia 02/05/2013    Past Surgical History:  Procedure Laterality Date  . CERVICAL CERCLAGE    . CERVICAL CERCLAGE N/A 06/21/2013   Procedure: Linna Caprice CERVICAL;  Surgeon: Marvene Staff, MD;  Location: [facility];  Service: Gynecology;  Laterality: N/A;  . CESAREAN SECTION    . CESAREAN SECTION N/A 12/17/2013   Procedure: Repeat CESAREAN SECTION with Cerclage Removal;  Surgeon: Marvene Staff, MD;  Location: [facility];  Service: Obstetrics;  Laterality: N/A;  EDD: 12/22/13  . CHOLECYSTECTOMY    . CHOLECYSTECTOMY OPEN  08/2011  . LAPAROSCOPIC ENDOMETRIOSIS  FULGURATION  01/2011    OB History    Gravida Para Term Preterm AB Living   2 2 2     2    SAB TAB Ectopic Multiple Live Births                   Home Medications    Prior to Admission medications   Medication Sig Start Date End Date Taking? Authorizing Provider  albuterol (PROVENTIL HFA;VENTOLIN HFA) 108 (90 Base) MCG/ACT inhaler Inhale 1-2 puffs into the lungs every 6 (six) hours as needed for wheezing or shortness of breath.   Yes Historical Provider, MD  Biotin 10 MG TABS Take 1 tablet by mouth daily.   Yes Historical Provider, MD  cholecalciferol (VITAMIN D) 400 units TABS tablet Take 400 Units by mouth daily.   Yes Historical Provider, MD  Cyanocobalamin (VITAMIN B-12 PO) Take 1 tablet by mouth daily.   Yes Historical Provider, MD  Fluticasone Furoate-Vilanterol (BREO ELLIPTA IN) Inhale 1 puff into the lungs 2 (two) times daily as needed (shortness of breath).   Yes Historical Provider, MD  folic acid (FOLVITE) 100 MCG tablet Take 400 mcg by mouth daily.   Yes Historical Provider, MD  Insulin Degludec (TRESIBA FLEXTOUCH) 100 UNIT/ML SOPN Inject 62 Units into the skin daily.   Yes Historical Provider, MD  Liraglutide (VICTOZA) 18 MG/3ML SOPN Inject 0.6 mg into the skin daily.   Yes Historical Provider, MD  losartan (COZAAR) 25 MG tablet  Take 25 mg by mouth daily.   Yes Historical Provider, MD  medroxyPROGESTERone (DEPO-PROVERA) 150 MG/ML injection Inject 150 mg into the muscle every 3 (three) months.   Yes Historical Provider, MD  metFORMIN (GLUCOPHAGE-XR) 500 MG 24 hr tablet Take 1,000 mg by mouth 2 (two) times daily.    Yes Historical Provider, MD    Family History Family History  Problem Relation Age of Onset  . Diabetes Maternal Aunt   . Cancer Maternal Grandmother     breast, colon  . Diabetes Maternal Grandmother     Social History Social History  Substance Use Topics  . Smoking status: Never Smoker  . Smokeless tobacco: Never Used  . Alcohol use No      Allergies   Metronidazole and Shellfish allergy   Review of Systems Review of Systems  All other systems reviewed and are negative.    Physical Exam Updated Vital Signs BP 133/90   Pulse 81   Temp 97.5 F (36.4 C) (Oral)   Resp 11   Ht 5' 6"  (1.676 m)   Wt 170 lb (77.1 kg)   SpO2 100%   BMI 27.44 kg/m   Physical Exam  Constitutional: She is oriented to person, place, and time. She appears well-developed and well-nourished. No distress.  HENT:  Head: Normocephalic and atraumatic.  Eyes: EOM are normal.  Neck: Normal range of motion.  Cardiovascular: Normal rate, regular rhythm and normal heart sounds.   Pulmonary/Chest: Effort normal and breath sounds normal.  Abdominal: Soft. She exhibits no distension.  Mild generalized abdominal tenderness without peritoneal signs.  No focal tenderness  Musculoskeletal: Normal range of motion.  Neurological: She is alert and oriented to person, place, and time.  Skin: Skin is warm and dry.  Psychiatric: She has a normal mood and affect. Judgment normal.  Nursing note and vitals reviewed.    ED Treatments / Results  Labs (all labs ordered are listed, but only abnormal results are displayed) Labs Reviewed  COMPREHENSIVE METABOLIC PANEL - Abnormal; Notable for the following:       Result Value   Glucose, Bld 243 (*)    AST 14 (*)    ALT 13 (*)    All other components within normal limits  CBC - Abnormal; Notable for the following:    RBC 5.54 (*)    All other components within normal limits  URINALYSIS, ROUTINE W REFLEX MICROSCOPIC (NOT AT [facility]) - Abnormal; Notable for the following:    Glucose, UA >1000 (*)    All other components within normal limits  URINE MICROSCOPIC-ADD ON - Abnormal; Notable for the following:    Squamous Epithelial / LPF 0-5 (*)    Bacteria, UA FEW (*)    All other components within normal limits  LIPASE, BLOOD  I-STAT BETA HCG BLOOD, ED ([facility], [facility], [facility] ONLY)    EKG  EKG  Interpretation None       Radiology Dg Abd 2 Views  Result Date: 06/01/2016 CLINICAL DATA:  Diarrhea, vomiting EXAM: ABDOMEN - 2 VIEW COMPARISON:  05/21/2010 FINDINGS: The bowel gas pattern is normal. There is no evidence of free air. No radio-opaque calculi or other significant radiographic abnormality is seen. IMPRESSION: Negative. Electronically Signed   By: Kathreen Devoid   On: 06/01/2016 11:56   Procedures Procedures (including critical care time)  Medications Ordered in ED Medications  sodium chloride 0.9 % bolus 2,000 mL (2,000 mLs Intravenous New Bag/Given 06/01/16 1108)  ondansetron (ZOFRAN) injection 4 mg (4 mg  Intravenous Given 06/01/16 1108)  morphine 4 MG/ML injection 4 mg (4 mg Intravenous Given 06/01/16 1108)     Initial Impression / Assessment and Plan / ED Course  I have reviewed the triage vital signs and the nursing notes.  Pertinent labs & imaging results that were available during my care of the patient were reviewed by me and considered in my medical decision making (see chart for details).  Clinical Course    1:27 PM Patient is overall well-appearing.  She is feeling better after IV fluids.  I suspect she had some degree of dehydration.  She is scheduled to see GI follow-up and encouraged her to continue to follow-up with GI.  She'll be discharged home with a prescription for nausea medicine and Bentyl as needed for pain.  Close primary care follow-up.  She understands return to the ER for new or worsening symptoms  Final Clinical Impressions(s) / ED Diagnoses   Final diagnoses:  Vomiting  Nausea  Diarrhea, unspecified type  Generalized abdominal pain    New Prescriptions New Prescriptions   DICYCLOMINE (BENTYL) 20 MG TABLET    Take 1 tablet (20 mg total) by mouth every 8 (eight) hours as needed for spasms (abdominal pain).   ONDANSETRON (ZOFRAN ODT) 8 MG DISINTEGRATING TABLET    Take 1 tablet (8 mg total) by mouth every 8 (eight) hours as needed for  nausea or vomiting.     Jola Schmidt, MD 06/01/16 [id number]

## 2016-06-01 NOTE — ED Notes (Signed)
Pt is in stable condition upon d/c and is escorted from ED via wheelchair. 

## 2016-06-01 NOTE — ED Triage Notes (Signed)
Patient here with several weeks of ongoing epigastric pain with nausea and diarrhea. Has seen GI for same and to have endo and colonoscopy but havent been scheduled as of yet.

## 2016-06-02 ENCOUNTER — Other Ambulatory Visit: Payer: Self-pay | Admitting: Gastroenterology

## 2016-06-02 DIAGNOSIS — R1033 Periumbilical pain: Secondary | ICD-10-CM

## 2016-06-03 ENCOUNTER — Ambulatory Visit
Admission: RE | Admit: 2016-06-03 | Discharge: 2016-06-03 | Disposition: A | Discharge status: Home / Self Care | Payer: PRIVATE HEALTH INSURANCE | Source: Ambulatory Visit | Attending: Gastroenterology | Admitting: Gastroenterology

## 2016-06-03 DIAGNOSIS — R1033 Periumbilical pain: Secondary | ICD-10-CM

## 2016-06-03 MED ORDER — IOPAMIDOL (ISOVUE-300) INJECTION 61%
100.0000 mL | Freq: Once | INTRAVENOUS | Status: AC | PRN
  Administered 2016-06-03: 100 mL via INTRAVENOUS

## 2016-07-23 ENCOUNTER — Ambulatory Visit (INDEPENDENT_AMBULATORY_CARE_PROVIDER_SITE_OTHER): Payer: No Typology Code available for payment source | Admitting: Physician Assistant

## 2016-07-23 VITALS — BP 120/86 | HR 100 | Temp 99.1°F | Resp 16 | Ht 66.0 in | Wt 169.0 lb

## 2016-07-23 DIAGNOSIS — Z23 Encounter for immunization: Secondary | ICD-10-CM

## 2016-07-23 NOTE — Progress Notes (Signed)
   07/23/2016 2:48 PM   DOB: 1975-11-21 / MRN: [id number]  SUBJECTIVE:  Cindy Long is a 40 y.o. female presenting for hepatitis B shot.  She was told by her gastroenterologist to come here given a "low titer" and reports the GI doc does not have that immunization available.  She needs this placed today so that she may start therapy for Chron.    She is allergic to metronidazole and shellfish allergy.   She  has a past medical history of Diabetes mellitus; GERD (gastroesophageal reflux disease); Hypertension; Mastitis; and Postpartum care following cesarean delivery (2/10) (12/17/2013).    She  reports that she has never smoked. She has never used smokeless tobacco. She reports that she does not drink alcohol or use drugs. She  has no sexual activity history on file. The patient  has a past surgical history that includes Cesarean section; Cervical cerclage; Cholecystectomy; Laparoscopic endometriosis fulguration (01/2011); Cholecystectomy open (08/2011); Cervical cerclage (N/A, 06/21/2013); and Cesarean section (N/A, 12/17/2013).  Her family history includes Cancer in her maternal grandmother; Diabetes in her maternal aunt and maternal grandmother.  Review of Systems  Constitutional: Negative.     The problem list and medications were reviewed and updated by myself where necessary and exist elsewhere in the encounter.   OBJECTIVE:  BP 120/86   Pulse 100   Temp 99.1 F (37.3 C) (Oral)   Resp 16   Ht 5\' 6"  (1.676 m)   Wt 169 lb (76.7 kg)   SpO2 100%   BMI 27.28 kg/m   Physical Exam  Vitals reviewed.   No results found for this or any previous visit (from the past 72 hour(s)).  No results found.  ASSESSMENT AND PLAN  Cindy Long was seen today for immunizations.  Diagnoses and all orders for this visit:  Need for hepatitis B vaccination: While she lacks documentation her HPI makes perfect sense.  I will go ahead and immunize her and provide her with documentation of this.  -      Hepatitis B vaccine adult IM    The patient is advised to call or return to clinic if she does not see an improvement in symptoms, or to seek the care of the closest emergency department if she worsens with the above plan.   Philis Fendt, MHS, PA-C [facility] 07/23/2016 2:48 PM

## 2016-07-23 NOTE — Patient Instructions (Signed)
     IF you received an x-ray today, you will receive an invoice from [facility]. Please contact [facility] at [phone] with questions or concerns regarding your invoice.   IF you received labwork today, you will receive an invoice from [facility]. Please contact [facility] at [phone] with questions or concerns regarding your invoice.   Our billing staff will not be able to assist you with questions regarding bills from these companies.  You will be contacted with the lab results as soon as they are available. The fastest way to get your results is to activate your My Chart account. Instructions are located on the last page of this paperwork. If you have not heard from us regarding the results in 2 weeks, please contact this office.      

## 2016-08-22 ENCOUNTER — Ambulatory Visit (INDEPENDENT_AMBULATORY_CARE_PROVIDER_SITE_OTHER): Payer: No Typology Code available for payment source | Admitting: Rheumatology

## 2016-08-22 DIAGNOSIS — M25521 Pain in right elbow: Secondary | ICD-10-CM

## 2016-08-22 DIAGNOSIS — M25562 Pain in left knee: Secondary | ICD-10-CM

## 2016-08-22 DIAGNOSIS — M25561 Pain in right knee: Secondary | ICD-10-CM | POA: Diagnosis not present

## 2016-08-22 DIAGNOSIS — M25571 Pain in right ankle and joints of right foot: Secondary | ICD-10-CM

## 2016-08-22 DIAGNOSIS — K50019 Crohn's disease of small intestine with unspecified complications: Secondary | ICD-10-CM | POA: Diagnosis not present

## 2016-08-22 DIAGNOSIS — M25511 Pain in right shoulder: Secondary | ICD-10-CM

## 2016-08-22 DIAGNOSIS — Z79899 Other long term (current) drug therapy: Secondary | ICD-10-CM | POA: Diagnosis not present

## 2016-10-19 ENCOUNTER — Ambulatory Visit: Payer: No Typology Code available for payment source | Admitting: *Deleted

## 2016-10-27 ENCOUNTER — Ambulatory Visit: Payer: No Typology Code available for payment source | Admitting: *Deleted

## 2016-11-15 ENCOUNTER — Encounter: Payer: No Typology Code available for payment source | Attending: Internal Medicine | Admitting: *Deleted

## 2016-11-15 DIAGNOSIS — E118 Type 2 diabetes mellitus with unspecified complications: Secondary | ICD-10-CM

## 2016-11-15 DIAGNOSIS — Z713 Dietary counseling and surveillance: Secondary | ICD-10-CM | POA: Insufficient documentation

## 2016-11-15 DIAGNOSIS — E1165 Type 2 diabetes mellitus with hyperglycemia: Secondary | ICD-10-CM | POA: Diagnosis not present

## 2016-11-17 NOTE — Progress Notes (Signed)
Diabetes Self-Management Education  Visit Type: First/Initial  Appt. Start Time: 1530 Appt. End Time: 1700  11/17/2016  [name], identified by name and date of birth, is a 41 y.o. female with a diagnosis of Diabetes: Type 2. She has decided on Omni Pod insulin pump and Dexcom CGM. She is not comfortable with carb counting.  ASSESSMENT  Height 5' (1.524 m), weight 175 lb 14.4 oz (79.8 kg), unknown if currently breastfeeding. Body mass index is 34.35 kg/m.      Diabetes Self-Management Education - 11/15/16 1523      Visit Information   Visit Type First/Initial     Initial Visit   Diabetes Type Type 2   Are you currently following a meal plan? Yes   What type of meal plan do you follow? no red meat for Crohns   Are you taking your medications as prescribed? Yes   Date Diagnosed age of 37     Health Coping   How would you rate your overall health? Fair     Psychosocial Assessment   Cindy Long Belief/Attitude about Diabetes Defeat/Burnout   Self-care barriers None   Other persons present Cindy Long   Cindy Long Concerns Medication  has new Omni Pod insulin pump   Special Needs None   How often do you need to have someone help you when you read instructions, pamphlets, or other written materials from your doctor or pharmacy? 1 - Never   What is the last grade level you completed in school? college in practical nursing     Pre-Education Assessment   Cindy Long understands the diabetes disease and treatment process. Needs Review   Cindy Long understands incorporating nutritional management into lifestyle. Needs Instruction   Cindy Long undertands incorporating physical activity into lifestyle. Needs Instruction   Cindy Long understands using medications safely. Needs Review   Cindy Long understands monitoring blood glucose, interpreting and using results Needs Review   Cindy Long understands prevention, detection, and treatment of acute complications. Demonstrates understanding / competency    Cindy Long understands prevention, detection, and treatment of chronic complications. Demonstrates understanding / competency   Cindy Long understands how to develop strategies to address psychosocial issues. Needs Review   Cindy Long understands how to develop strategies to promote health/change behavior. Needs Review     Complications   Last HgB A1C per Cindy Long/outside source 14 %   How often do you check your blood sugar? 1-2 times/day   Have you had a dilated eye exam in the past 12 months? Yes   Have you had a dental exam in the past 12 months? Yes   Are you checking your feet? Yes   How many days per week are you checking your feet? 7     Dietary Intake   Breakfast granola bar OR pita chips with PNB at work   Snack (morning) popcorn   Lunch catered meal at work or goes out: sit [employer] such as [place] food, meat, starch, vegetables usually   Snack (afternoon) occasionally granola bar   Dinner avoids red meat due to new diagnosis of Crohn's disease. Usually lean meat, starch, vegetables or dark green salad   Snack (evening) short bread cookie or vanilla wafers   Beverage(s) diet soda, sweet tea, water, hot tea with Splenda     Exercise   Exercise Type ADL's   How many days per week to you exercise? 0   How many minutes per day do you exercise? 0   Total minutes per week of exercise 0  Cindy Long Education   Previous Diabetes Education Yes (please comment)  2014 when pregnant   Disease state  Explored Cindy Long's options for treatment of their diabetes   Nutrition management  Carbohydrate counting;Food label reading, portion sizes and measuring food.;Role of diet in the treatment of diabetes and the relationship between the three main macronutrients and blood glucose level   Physical activity and exercise  Role of exercise on diabetes management, blood pressure control and cardiac health.   Medications Reviewed patients medication for diabetes, action, purpose, timing of dose and  side effects.  intro to pumping covered   Monitoring Identified appropriate SMBG and/or A1C goals.   Psychosocial adjustment Worked with Cindy Long to identify barriers to care and solutions  informed her of Support Group     Individualized Goals (developed by Cindy Long)   Nutrition Other (comment)  Carb Counting for move to insulin pump   Physical Activity 15 minutes per day   Medications take my medication as prescribed   Monitoring  test blood glucose pre and post meals as discussed     Post-Education Assessment   Cindy Long understands incorporating nutritional management into lifestyle. Demonstrates understanding / competency   Cindy Long understands using medications safely. Demonstrates understanding / competency     Outcomes   Expected Outcomes Demonstrated interest in learning. Expect positive outcomes   Future DMSE PRN   Program Status Not Completed      Individualized Plan for Diabetes Self-Management Training:   Learning Objective:  Cindy Long will have a greater understanding of diabetes self-management. Cindy Long education plan is to attend individual and/or group sessions per assessed needs and concerns.   Plan:   Cindy Long Instructions   Consider identifying foods by food group and practice with portion sizes to move into carb counting  We will contact Omni Pod rep, Brandy, to set up training for you on Omni Pod pump  You are aware of the DM1 / Pump Support Group and I encourage you to attend when you can for the camaraderie and ongoing education.    Expected Outcomes:  Demonstrated interest in learning. Expect positive outcomes  Education material provided: Meal plan card and Carbohydrate counting sheet, Intro to pumping handout  If problems or questions, Cindy Long to contact team via:  Phone and Email  Future DSME appointment: PRN

## 2016-11-17 NOTE — Patient Instructions (Signed)
   Consider identifying foods by food group and practice with portion sizes to move into carb counting  We will contact Omni Pod rep, Brandy, to set up training for you on Omni Pod pump  You are aware of the DM1 / Pump Support Group and I encourage you to attend when you can for the camaraderie and ongoing education.

## 2016-11-22 ENCOUNTER — Ambulatory Visit: Payer: No Typology Code available for payment source | Admitting: Rheumatology

## 2017-02-15 ENCOUNTER — Emergency Department (HOSPITAL_COMMUNITY): Payer: No Typology Code available for payment source

## 2017-02-15 ENCOUNTER — Emergency Department (HOSPITAL_COMMUNITY)
Admission: EM | Admit: 2017-02-15 | Discharge: 2017-02-15 | Disposition: A | Discharge status: Home / Self Care | Payer: No Typology Code available for payment source | Attending: Emergency Medicine | Admitting: Emergency Medicine

## 2017-02-15 ENCOUNTER — Encounter (HOSPITAL_COMMUNITY): Payer: Self-pay | Admitting: *Deleted

## 2017-02-15 DIAGNOSIS — I10 Essential (primary) hypertension: Secondary | ICD-10-CM | POA: Insufficient documentation

## 2017-02-15 DIAGNOSIS — Z79899 Other long term (current) drug therapy: Secondary | ICD-10-CM | POA: Insufficient documentation

## 2017-02-15 DIAGNOSIS — Z794 Long term (current) use of insulin: Secondary | ICD-10-CM | POA: Insufficient documentation

## 2017-02-15 DIAGNOSIS — R1084 Generalized abdominal pain: Secondary | ICD-10-CM

## 2017-02-15 DIAGNOSIS — E119 Type 2 diabetes mellitus without complications: Secondary | ICD-10-CM | POA: Diagnosis not present

## 2017-02-15 HISTORY — DX: Crohn's disease, unspecified, without complications: K50.90

## 2017-02-15 LAB — URINALYSIS, ROUTINE W REFLEX MICROSCOPIC
Bacteria, UA: NONE SEEN
Bilirubin Urine: NEGATIVE
Glucose, UA: >=500 mg/dL — AB
Hgb urine dipstick: NEGATIVE
Ketones, ur: NEGATIVE mg/dL
Leukocytes, UA: NEGATIVE
Nitrite: NEGATIVE
Protein, ur: NEGATIVE mg/dL
Specific Gravity, Urine: 1.023 (ref 1.005–1.030)
pH: 6 (ref 5.0–8.0)

## 2017-02-15 LAB — COMPREHENSIVE METABOLIC PANEL
ALT: 11 U/L — ABNORMAL LOW (ref 14–54)
AST: 12 U/L — ABNORMAL LOW (ref 15–41)
Albumin: 3.7 g/dL (ref 3.5–5.0)
Alkaline Phosphatase: 70 U/L (ref 38–126)
Anion gap: 9 (ref 5–15)
BUN: 8 mg/dL (ref 6–20)
CO2: 21 mmol/L — ABNORMAL LOW (ref 22–32)
Calcium: 9.3 mg/dL (ref 8.9–10.3)
Chloride: 108 mmol/L (ref 101–111)
Creatinine, Ser: 0.66 mg/dL (ref 0.44–1.00)
GFR calc Af Amer: >60 mL/min (ref >60)
GFR calc non Af Amer: >60 mL/min (ref >60)
Glucose, Bld: 179 mg/dL — ABNORMAL HIGH (ref 65–99)
Potassium: 3.3 mmol/L — ABNORMAL LOW (ref 3.5–5.1)
Sodium: 138 mmol/L (ref 135–145)
Total Bilirubin: 0.4 mg/dL (ref 0.3–1.2)
Total Protein: 7.3 g/dL (ref 6.5–8.1)

## 2017-02-15 LAB — CBC
HCT: 45.3 % (ref 36.0–46.0)
Hemoglobin: 15.4 g/dL — ABNORMAL HIGH (ref 12.0–15.0)
MCH: 27 pg (ref 26.0–34.0)
MCHC: 34 g/dL (ref 30.0–36.0)
MCV: 79.5 fL (ref 78.0–100.0)
Platelets: 264 10*3/uL (ref 150–400)
RBC: 5.7 MIL/uL — ABNORMAL HIGH (ref 3.87–5.11)
RDW: 13.8 % (ref 11.5–15.5)
WBC: 6.9 10*3/uL (ref 4.0–10.5)

## 2017-02-15 LAB — LIPASE, BLOOD: Lipase: 36 U/L (ref 11–51)

## 2017-02-15 LAB — PREGNANCY, URINE: Preg Test, Ur: NEGATIVE

## 2017-02-15 MED ORDER — ONDANSETRON 8 MG PO TBDP: 8.0000 mg | ORAL_TABLET | Freq: Three times a day (TID) | ORAL | 0 refills | Status: DC | PRN

## 2017-02-15 MED ORDER — SODIUM CHLORIDE 0.9 % IV BOLUS (SEPSIS)
1000.0000 mL | Freq: Once | INTRAVENOUS | Status: AC
  Administered 2017-02-15: 1000 mL via INTRAVENOUS

## 2017-02-15 MED ORDER — HYOSCYAMINE SULFATE 0.125 MG SL SUBL
0.2500 mg | SUBLINGUAL_TABLET | Freq: Once | SUBLINGUAL | Status: AC
  Administered 2017-02-15: 0.25 mg via SUBLINGUAL
  Filled 2017-02-15: qty 2

## 2017-02-15 MED ORDER — ONDANSETRON HCL 4 MG/2ML IJ SOLN
4.0000 mg | Freq: Once | INTRAMUSCULAR | Status: AC
  Administered 2017-02-15: 4 mg via INTRAVENOUS
  Filled 2017-02-15: qty 2

## 2017-02-15 MED ORDER — POTASSIUM CHLORIDE CRYS ER 20 MEQ PO TBCR
20.0000 meq | EXTENDED_RELEASE_TABLET | Freq: Once | ORAL | Status: AC
  Administered 2017-02-15: 20 meq via ORAL
  Filled 2017-02-15: qty 1

## 2017-02-15 MED ORDER — HYOSCYAMINE SULFATE 0.125 MG PO TABS: 0.1250 mg | ORAL_TABLET | ORAL | 0 refills | Status: DC | PRN

## 2017-02-15 NOTE — ED Triage Notes (Signed)
Pt c/o severe mid abdominal pain with nausea and diarrhea x 1 week. Denies vomiting, bloody stool. Pt was seen by PCP last week and said they thought it to be a Crohn's flare up. GI doctor was seen also in the last week and was told that it was caused by stress and not a crohn's flare up. Pt was given medication to help her have BM by GI doctor and pt reports last BM this morning but no relief of the abdominal pain.

## 2017-02-15 NOTE — ED Notes (Signed)
Pt given cup of water to drink for fluid challenge. 

## 2017-02-18 NOTE — ED Provider Notes (Signed)
[facility] DEPT Provider Note   CSN: [id number] Arrival date & time: 02/15/17  4315     History   Chief Complaint Chief Complaint  Patient presents with  . Abdominal Pain    HPI Cindy Long is a 41 y.o. female with a past medical history significant for Crohns disease, GERD, insulin dependent DM on insulin pump and HTN presenting with a now 2 week history of mid abdominal intermittent cramping pain with persistent nausea without emesis or diarrhea.  She was seen by her pcp last week who thought she was having a Crohns flare, but also saw her GI specialist who felt the problem was more constipation/stress and not a Crohns issue. She reports regular bms without blood or mucus and does not feel she is constipated.  She was placed on a medicine for constipation (? Name) and had a bm this am without relief of pain. She is able to tolerate fluids, has not had a appetite for solid foods. She has found no alleviators for her symptoms.   The history is provided by the patient.    Past Medical History:  Diagnosis Date  . Crohn disease ([place])   . Diabetes mellitus    IDDM  . GERD (gastroesophageal reflux disease)    no meds currently  . Hypertension   . Mastitis    right breast  . Postpartum care following cesarean delivery (2/10) 12/17/2013    Patient Active Problem List   Diagnosis Date Noted  . Postpartum care following cesarean delivery (2/10) 12/17/2013  . Indication for care in labor or delivery 11/30/2013  . IBS (irritable bowel syndrome) 02/05/2013  . Other and unspecified hyperlipidemia 02/05/2013    Past Surgical History:  Procedure Laterality Date  . CERVICAL CERCLAGE    . CERVICAL CERCLAGE N/A 06/21/2013   Procedure: Linna Caprice CERVICAL;  Surgeon: Marvene Staff, MD;  Location: [facility];  Service: Gynecology;  Laterality: N/A;  . CESAREAN SECTION    . CESAREAN SECTION N/A 12/17/2013   Procedure: Repeat CESAREAN SECTION with Cerclage Removal;   Surgeon: Marvene Staff, MD;  Location: [facility];  Service: Obstetrics;  Laterality: N/A;  EDD: 12/22/13  . CHOLECYSTECTOMY    . CHOLECYSTECTOMY OPEN  08/2011  . LAPAROSCOPIC ENDOMETRIOSIS FULGURATION  01/2011    OB History    Gravida Para Term Preterm AB Living   2 2 2     2    SAB TAB Ectopic Multiple Live Births           2       Home Medications    Prior to Admission medications   Medication Sig Start Date End Date Taking? Authorizing Provider  albuterol (PROVENTIL HFA;VENTOLIN HFA) 108 (90 Base) MCG/ACT inhaler Inhale 1-2 puffs into the lungs every 6 (six) hours as needed for wheezing or shortness of breath.   Yes Historical Provider, MD  ALPRAZolam Duanne Moron) 0.25 MG tablet Take 0.25 mg by mouth at bedtime as needed for anxiety.   Yes Historical Provider, MD  azaTHIOprine (IMURAN) 50 MG tablet Take 50 mg by mouth 2 (two) times daily.    Yes Historical Provider, MD  BuPROPion HCl (WELLBUTRIN PO) Take 100 mg by mouth.   Yes Historical Provider, MD  dicyclomine (BENTYL) 20 MG tablet Take 1 tablet (20 mg total) by mouth every 8 (eight) hours as needed for spasms (abdominal pain). 06/01/16  Yes Jola Schmidt, MD  empagliflozin (JARDIANCE) 25 MG TABS tablet Take 25 mg by mouth daily.   Yes  Historical Provider, MD  Insulin Human (INSULIN PUMP) SOLN Inject into the skin.   Yes Historical Provider, MD  Insulin Human (INSULIN PUMP) SOLN Inject 200 each into the skin 3 (three) times daily.   Yes Historical Provider, MD  losartan (COZAAR) 25 MG tablet Take 25 mg by mouth daily.   Yes Historical Provider, MD  medroxyPROGESTERone (DEPO-PROVERA) 150 MG/ML injection Inject 150 mg into the muscle every 3 (three) months.   Yes Historical Provider, MD  hyoscyamine (LEVSIN, ANASPAZ) 0.125 MG tablet Take 1-2 tablets (0.125-0.25 mg total) by mouth every 4 (four) hours as needed. 02/15/17   Evalee Jefferson, PA-C  ondansetron (ZOFRAN ODT) 8 MG disintegrating tablet Take 1 tablet (8 mg total) by mouth every 8  (eight) hours as needed for nausea or vomiting. 02/15/17   Evalee Jefferson, PA-C    Family History Family History  Problem Relation Age of Onset  . Diabetes Maternal Aunt   . Cancer Maternal Grandmother     breast, colon  . Diabetes Maternal Grandmother     Social History Social History  Substance Use Topics  . Smoking status: Never Smoker  . Smokeless tobacco: Never Used  . Alcohol use Yes     Comment: socailly      Allergies   Metronidazole and Shellfish allergy   Review of Systems Review of Systems  Constitutional: Negative for fever.  HENT: Negative for congestion and sore throat.   Eyes: Negative.   Respiratory: Negative for chest tightness and shortness of breath.   Cardiovascular: Negative for chest pain.  Gastrointestinal: Positive for abdominal pain, constipation and nausea. Negative for diarrhea and vomiting.  Genitourinary: Negative.   Musculoskeletal: Negative for arthralgias, joint swelling and neck pain.  Skin: Negative.  Negative for rash and wound.  Neurological: Negative for dizziness, weakness, light-headedness, numbness and headaches.  Psychiatric/Behavioral: Negative.      Physical Exam Updated Vital Signs BP 102/71   Pulse 79   Temp 98.3 F (36.8 C)   Resp 12   Ht 5\' 7"  (1.702 m)   Wt 79.4 kg   SpO2 99%   BMI 27.41 kg/m   Physical Exam  Constitutional: She appears well-developed and well-nourished.  HENT:  Head: Normocephalic and atraumatic.  Mouth/Throat: Oropharynx is clear and moist.  Eyes: Conjunctivae are normal.  Neck: Normal range of motion.  Cardiovascular: Normal rate, regular rhythm, normal heart sounds and intact distal pulses.   Pulmonary/Chest: Effort normal and breath sounds normal. She has no wheezes.  Abdominal: Soft. Bowel sounds are normal. She exhibits no distension and no mass. There is generalized tenderness. There is no rebound and no guarding.  Mild generalized ttp, no guarding or rebound. No distention or  increased tympany.  Musculoskeletal: Normal range of motion.  Neurological: She is alert.  Skin: Skin is warm and dry.  Psychiatric: She has a normal mood and affect.  Nursing note and vitals reviewed.    ED Treatments / Results  Labs (all labs ordered are listed, but only abnormal results are displayed) Labs Reviewed  COMPREHENSIVE METABOLIC PANEL - Abnormal; Notable for the following:       Result Value   Potassium 3.3 (*)    CO2 21 (*)    Glucose, Bld 179 (*)    AST 12 (*)    ALT 11 (*)    All other components within normal limits  CBC - Abnormal; Notable for the following:    RBC 5.70 (*)    Hemoglobin 15.4 (*)  All other components within normal limits  URINALYSIS, ROUTINE W REFLEX MICROSCOPIC - Abnormal; Notable for the following:    Color, Urine STRAW (*)    Glucose, UA >=500 (*)    Squamous Epithelial / LPF 0-5 (*)    All other components within normal limits  LIPASE, BLOOD  PREGNANCY, URINE    EKG  EKG Interpretation None       Radiology  EXAM:  ABDOMEN - 2 VIEW    COMPARISON: CT Abdomen and Pelvis 06/03/2016 and earlier.    FINDINGS:  Upright and supine views of the abdomen. Negative visible lung  bases. No pneumoperitoneum. Stable cholecystectomy clips. Non  obstructed bowel gas pattern. Partially visible left abdominal  insulin pump. Abdominal and pelvic visceral contours are within  normal limits. Pelvic phleboliths again noted. No acute osseous  abnormality identified.    IMPRESSION:  Normal bowel gas pattern, no free air.      Electronically Signed  By: Genevie Ann M.D.  On: 02/15/2017 13:07    Procedures Procedures (including critical care time)  Medications Ordered in ED Medications  sodium chloride 0.9 % bolus 1,000 mL (0 mLs Intravenous Stopped 02/15/17 1257)  ondansetron (ZOFRAN) injection 4 mg (4 mg Intravenous Given 02/15/17 0951)  hyoscyamine (LEVSIN SL) SL tablet 0.25 mg (0.25 mg Sublingual Given 02/15/17 0951)    potassium chloride SA (K-DUR,KLOR-CON) CR tablet 20 mEq (20 mEq Oral Given 02/15/17 1158)     Initial Impression / Assessment and Plan / ED Course  I have reviewed the triage vital signs and the nursing notes.  Pertinent labs & imaging results that were available during my care of the patient were reviewed by me and considered in my medical decision making (see chart for details).     Labs and imaging reviewed, no red flag findings including no abnormal bowel gas pattern including free air or obstruction on plain films.  Repeat exam prior to dc still with benign abd, no guarding.  Pt was given IV fluids and zofran and she tolerated PO intake. Given levsin with complete resolution of pain which suggests functional/spasm source of pain. Additional levsin prescribed along with zofran. Bland diet discussed. Advised f/u with pcp and/or GI if sx persist, returning here for any worsened sx.  Final Clinical Impressions(s) / ED Diagnoses   Final diagnoses:  Generalized abdominal pain    New Prescriptions Discharge Medication List as of 02/15/2017  1:37 PM    START taking these medications   Details  hyoscyamine (LEVSIN, ANASPAZ) 0.125 MG tablet Take 1-2 tablets (0.125-0.25 mg total) by mouth every 4 (four) hours as needed., Starting Wed 02/15/2017, Print    ondansetron (ZOFRAN ODT) 8 MG disintegrating tablet Take 1 tablet (8 mg total) by mouth every 8 (eight) hours as needed for nausea or vomiting., Starting Wed 02/15/2017, Print         Evalee Jefferson, PA-C 02/18/17 [place], MD 02/18/17 [phone]

## 2017-07-23 ENCOUNTER — Emergency Department (HOSPITAL_COMMUNITY): Payer: No Typology Code available for payment source

## 2017-07-23 ENCOUNTER — Emergency Department (HOSPITAL_COMMUNITY)
Admission: EM | Admit: 2017-07-23 | Discharge: 2017-07-24 | Disposition: A | Discharge status: Home / Self Care | Payer: No Typology Code available for payment source | Attending: Emergency Medicine | Admitting: Emergency Medicine

## 2017-07-23 ENCOUNTER — Encounter (HOSPITAL_COMMUNITY): Payer: Self-pay | Admitting: Emergency Medicine

## 2017-07-23 DIAGNOSIS — Z794 Long term (current) use of insulin: Secondary | ICD-10-CM | POA: Diagnosis not present

## 2017-07-23 DIAGNOSIS — E119 Type 2 diabetes mellitus without complications: Secondary | ICD-10-CM | POA: Insufficient documentation

## 2017-07-23 DIAGNOSIS — I10 Essential (primary) hypertension: Secondary | ICD-10-CM | POA: Insufficient documentation

## 2017-07-23 DIAGNOSIS — R0602 Shortness of breath: Secondary | ICD-10-CM | POA: Insufficient documentation

## 2017-07-23 DIAGNOSIS — Z79899 Other long term (current) drug therapy: Secondary | ICD-10-CM | POA: Insufficient documentation

## 2017-07-23 DIAGNOSIS — R079 Chest pain, unspecified: Secondary | ICD-10-CM | POA: Diagnosis not present

## 2017-07-23 HISTORY — DX: Pure hypercholesterolemia, unspecified: E78.00

## 2017-07-23 LAB — CBC
HCT: 42.4 % (ref 36.0–46.0)
Hemoglobin: 14.6 g/dL (ref 12.0–15.0)
MCH: 27 pg (ref 26.0–34.0)
MCHC: 34.4 g/dL (ref 30.0–36.0)
MCV: 78.5 fL (ref 78.0–100.0)
Platelets: 227 10*3/uL (ref 150–400)
RBC: 5.4 MIL/uL — ABNORMAL HIGH (ref 3.87–5.11)
RDW: 14 % (ref 11.5–15.5)
WBC: 8.8 10*3/uL (ref 4.0–10.5)

## 2017-07-23 LAB — BASIC METABOLIC PANEL
Anion gap: 8 (ref 5–15)
BUN: 9 mg/dL (ref 6–20)
CO2: 24 mmol/L (ref 22–32)
Calcium: 9.4 mg/dL (ref 8.9–10.3)
Chloride: 104 mmol/L (ref 101–111)
Creatinine, Ser: 0.75 mg/dL (ref 0.44–1.00)
GFR calc Af Amer: >60 mL/min (ref >60)
GFR calc non Af Amer: >60 mL/min (ref >60)
Glucose, Bld: 276 mg/dL — ABNORMAL HIGH (ref 65–99)
Potassium: 3.8 mmol/L (ref 3.5–5.1)
Sodium: 136 mmol/L (ref 135–145)

## 2017-07-23 LAB — I-STAT BETA HCG BLOOD, ED (MC, WL, AP ONLY): I-stat hCG, quantitative: <5 m[IU]/mL (ref <5)

## 2017-07-23 LAB — I-STAT TROPONIN, ED: Troponin i, poc: 0 ng/mL (ref 0.00–0.08)

## 2017-07-23 LAB — D-DIMER, QUANTITATIVE (NOT AT ARMC): D-Dimer, Quant: 1.38 ug/mL-FEU — ABNORMAL HIGH (ref 0.00–0.50)

## 2017-07-23 MED ORDER — IOPAMIDOL (ISOVUE-370) INJECTION 76%
INTRAVENOUS | Status: AC
  Administered 2017-07-23: 100 mL
  Filled 2017-07-23: qty 100

## 2017-07-23 NOTE — ED Notes (Signed)
D-Dimer elevated, 1.38. Dr. Jeneen Rinks aware.  Verbal order for CT Angio PE

## 2017-07-23 NOTE — ED Notes (Signed)
Pt reports L sided CP present since Friday, also reports SOB and nausea. Pt went to her PCP for the CP on Friday where they ran a d-dimer, was told the test was "slightly elevated"   Per Dr. Jeneen Rinks run D-Dimer here since we do not the result from her PCP

## 2017-07-24 LAB — I-STAT TROPONIN, ED: Troponin i, poc: 0 ng/mL (ref 0.00–0.08)

## 2017-07-24 NOTE — Discharge Instructions (Signed)
Your labs and imaging tonight do not show any evidence of blood clot, heart attack, or other emergent findings.  It is recommended that you follow-up with your doctor.  You may also benefit from seeing a cardiologist.  Ask your doctor about this.

## 2017-07-24 NOTE — ED Provider Notes (Signed)
[facility] DEPT Provider Note   CSN: [id number] Arrival date & time: 07/23/17  2041     History   Chief Complaint Chief Complaint  Patient presents with  . Chest Pain  . Shortness of Breath    HPI ALTHEIA SHAFRAN is a 41 y.o. female.  Patient presents to the ED with a chief complaint of chest pain and shortness of breath.  She states that the symptoms have been occurring intermittently for the past week.  She states that she was seen by her PCP on Friday and had an elevated D-dimer.  She was instructed to come to the ER if she had any additional episodes of SOB or CP.  She states that she again experienced this tonight at 8:00pm.  She denies any symptoms now.  Denies any leg swelling or pain.  Denies cough or fever.  Denies any hx of ACS, PE, or DVT.  She questions whether her symptoms could be coming from prednisone, which she recently started because of Crohn's.     The history is provided by the patient. No language interpreter was used.    Past Medical History:  Diagnosis Date  . Crohn disease ([place])   . Diabetes mellitus    IDDM  . GERD (gastroesophageal reflux disease)    no meds currently  . High cholesterol   . Hypertension   . Mastitis    right breast  . Postpartum care following cesarean delivery (2/10) 12/17/2013    Patient Active Problem List   Diagnosis Date Noted  . Postpartum care following cesarean delivery (2/10) 12/17/2013  . Indication for care in labor or delivery 11/30/2013  . IBS (irritable bowel syndrome) 02/05/2013  . Other and unspecified hyperlipidemia 02/05/2013    Past Surgical History:  Procedure Laterality Date  . CERVICAL CERCLAGE    . CERVICAL CERCLAGE N/A 06/21/2013   Procedure: Linna Caprice CERVICAL;  Surgeon: Marvene Staff, MD;  Location: [facility];  Service: Gynecology;  Laterality: N/A;  . CESAREAN SECTION    . CESAREAN SECTION N/A 12/17/2013   Procedure: Repeat CESAREAN SECTION with Cerclage Removal;  Surgeon:  Marvene Staff, MD;  Location: [facility];  Service: Obstetrics;  Laterality: N/A;  EDD: 12/22/13  . CHOLECYSTECTOMY    . CHOLECYSTECTOMY OPEN  08/2011  . LAPAROSCOPIC ENDOMETRIOSIS FULGURATION  01/2011    OB History    Gravida Para Term Preterm AB Living   2 2 2     2    SAB TAB Ectopic Multiple Live Births           2       Home Medications    Prior to Admission medications   Medication Sig Start Date End Date Taking? Authorizing Provider  albuterol (PROVENTIL HFA;VENTOLIN HFA) 108 (90 Base) MCG/ACT inhaler Inhale 1-2 puffs into the lungs every 6 (six) hours as needed for wheezing or shortness of breath.    [provider]  ALPRAZolam Duanne Moron) 0.25 MG tablet Take 0.25 mg by mouth at bedtime as needed for anxiety.    [provider]  azaTHIOprine (IMURAN) 50 MG tablet Take 50 mg by mouth 2 (two) times daily.     [provider]  BuPROPion HCl (WELLBUTRIN PO) Take 100 mg by mouth.    [provider]  dicyclomine (BENTYL) 20 MG tablet Take 1 tablet (20 mg total) by mouth every 8 (eight) hours as needed for spasms (abdominal pain). 06/01/16   Jola Schmidt, MD  empagliflozin (JARDIANCE) 25 MG TABS tablet  Take 25 mg by mouth daily.    [provider]  hyoscyamine (LEVSIN, ANASPAZ) 0.125 MG tablet Take 1-2 tablets (0.125-0.25 mg total) by mouth every 4 (four) hours as needed. 02/15/17   Evalee Jefferson, PA-C  Insulin Human (INSULIN PUMP) SOLN Inject into the skin.    [provider]  Insulin Human (INSULIN PUMP) SOLN Inject 200 each into the skin 3 (three) times daily.    [provider]  losartan (COZAAR) 25 MG tablet Take 25 mg by mouth daily.    [provider]  medroxyPROGESTERone (DEPO-PROVERA) 150 MG/ML injection Inject 150 mg into the muscle every 3 (three) months.    [provider]  ondansetron (ZOFRAN ODT) 8 MG disintegrating tablet Take 1 tablet (8 mg total) by mouth every 8 (eight) hours as needed for  nausea or vomiting. 02/15/17   Evalee Jefferson, PA-C    Family History Family History  Problem Relation Age of Onset  . Diabetes Maternal Aunt   . Cancer Maternal Grandmother        breast, colon  . Diabetes Maternal Grandmother     Social History Social History  Substance Use Topics  . Smoking status: Never Smoker  . Smokeless tobacco: Never Used  . Alcohol use Yes     Comment: socailly      Allergies   Metronidazole and Shellfish allergy   Review of Systems Review of Systems  All other systems reviewed and are negative.    Physical Exam Updated Vital Signs BP (!) 134/91 (BP Location: Left Arm)   Pulse (!) 103   Temp 98.3 F (36.8 C)   Resp 20   Ht 5\' 7"  (1.702 m)   Wt 79.4 kg (175 lb)   SpO2 98%   BMI 27.41 kg/m   Physical Exam  Constitutional: She is oriented to person, place, and time. She appears well-developed and well-nourished.  HENT:  Head: Normocephalic and atraumatic.  Eyes: Pupils are equal, round, and reactive to light. Conjunctivae and EOM are normal.  Neck: Normal range of motion. Neck supple.  Cardiovascular: Normal rate and regular rhythm.  Exam reveals no gallop and no friction rub.   No murmur heard. Pulmonary/Chest: Effort normal and breath sounds normal. No respiratory distress. She has no wheezes. She has no rales. She exhibits no tenderness.  Abdominal: Soft. Bowel sounds are normal. She exhibits no distension and no mass. There is no tenderness. There is no rebound and no guarding.  Musculoskeletal: Normal range of motion. She exhibits no edema or tenderness.  Neurological: She is alert and oriented to person, place, and time.  Skin: Skin is warm and dry.  Psychiatric: She has a normal mood and affect. Her behavior is normal. Judgment and thought content normal.  Nursing note and vitals reviewed.    ED Treatments / Results  Labs (all labs ordered are listed, but only abnormal results are displayed) Labs Reviewed  BASIC METABOLIC  PANEL - Abnormal; Notable for the following:       Result Value   Glucose, Bld 276 (*)    All other components within normal limits  CBC - Abnormal; Notable for the following:    RBC 5.40 (*)    All other components within normal limits  D-DIMER, QUANTITATIVE (NOT AT [facility]) - Abnormal; Notable for the following:    D-Dimer, Quant 1.38 (*)    All other components within normal limits  I-STAT TROPONIN, ED  I-STAT BETA HCG BLOOD, ED ([facility], [facility], [facility] ONLY)  I-STAT  TROPONIN, ED    EKG  EKG Interpretation None       Radiology Dg Chest 2 View  Result Date: 07/23/2017 CLINICAL DATA:  Dyspnea mid chest pain radiating to the left breast x3 days. EXAM: CHEST  2 VIEW COMPARISON:  09/23/2012 FINDINGS: The heart size and mediastinal contours are within normal limits. Both lungs are clear. The visualized skeletal structures are unremarkable. Cholecystectomy clips in the right upper quadrant. IMPRESSION: No active cardiopulmonary disease.  No significant change. Electronically Signed   By: Ashley Royalty M.D.   On: 07/23/2017 21:35   Ct Angio Chest Pe W And/or Wo Contrast  Result Date: 07/23/2017 CLINICAL DATA:  Shortness of breath with mid and right chest pain EXAM: CT ANGIOGRAPHY CHEST WITH CONTRAST TECHNIQUE: Multidetector CT imaging of the chest was performed using the standard protocol during bolus administration of intravenous contrast. Multiplanar CT image reconstructions and MIPs were obtained to evaluate the vascular anatomy. CONTRAST:  100 mL Isovue 370 intravenous COMPARISON:  Radiograph 916 1,018 FINDINGS: Cardiovascular: Satisfactory opacification of the pulmonary arteries to the segmental level. No evidence of pulmonary embolism. Normal heart size. No pericardial effusion. Mediastinum/Nodes: No enlarged mediastinal, hilar, or axillary lymph nodes. Thyroid gland, trachea, and esophagus demonstrate no significant findings. Lungs/Pleura: Lungs are clear. No pleural effusion or pneumothorax. Upper  Abdomen: No acute abnormality. Musculoskeletal: No chest wall abnormality. No acute or significant osseous findings. Review of the MIP images confirms the above findings. IMPRESSION: Negative for acute pulmonary embolus.  Clear lung fields. Electronically Signed   By: Donavan Foil M.D.   On: 07/23/2017 22:54    Procedures Procedures (including critical care time)  Medications Ordered in ED Medications  iopamidol (ISOVUE-370) 76 % injection (100 mLs  Contrast Given 07/23/17 2219)     Initial Impression / Assessment and Plan / ED Course  I have reviewed the triage vital signs and the nursing notes.  Pertinent labs & imaging results that were available during my care of the patient were reviewed by me and considered in my medical decision making (see chart for details).     Patient with intermittent chest pain and SOB x 1 week.  Elevated d-dimer.  Sent by PCP to ED.  CT is negative for PE or any other acute abnormality.  Initial troponin is negative.  Labs are otherwise reassuring.  Patient will get second troponin, which if negative and patient remains symptom free, will plan for DC to home with PCP follow-up.  No acute ischemic EKG changes. HEART score is 3.  Doubt ACS.  Delta trop negative.  DC to home. Final Clinical Impressions(s) / ED Diagnoses   Final diagnoses:  Chest pain, unspecified type  Shortness of breath    New Prescriptions New Prescriptions   No medications on file     Delaine Lame 07/24/17 0054    Tanna Furry, MD 08/06/17 2101

## 2017-11-03 ENCOUNTER — Other Ambulatory Visit: Payer: Self-pay | Admitting: Physician Assistant

## 2017-11-03 DIAGNOSIS — R1900 Intra-abdominal and pelvic swelling, mass and lump, unspecified site: Secondary | ICD-10-CM

## 2017-11-03 DIAGNOSIS — K5010 Crohn's disease of large intestine without complications: Secondary | ICD-10-CM

## 2017-11-03 DIAGNOSIS — R1032 Left lower quadrant pain: Secondary | ICD-10-CM

## 2017-11-08 ENCOUNTER — Other Ambulatory Visit: Payer: No Typology Code available for payment source

## 2017-11-08 ENCOUNTER — Ambulatory Visit
Admission: RE | Admit: 2017-11-08 | Discharge: 2017-11-08 | Disposition: A | Discharge status: Home / Self Care | Payer: No Typology Code available for payment source | Source: Ambulatory Visit | Attending: Physician Assistant | Admitting: Physician Assistant

## 2017-11-08 DIAGNOSIS — R1900 Intra-abdominal and pelvic swelling, mass and lump, unspecified site: Secondary | ICD-10-CM

## 2017-11-08 DIAGNOSIS — K5010 Crohn's disease of large intestine without complications: Secondary | ICD-10-CM

## 2017-11-08 DIAGNOSIS — R1032 Left lower quadrant pain: Secondary | ICD-10-CM

## 2017-11-08 MED ORDER — IOPAMIDOL (ISOVUE-300) INJECTION 61%
100.0000 mL | Freq: Once | INTRAVENOUS | Status: AC | PRN
  Administered 2017-11-08: 100 mL via INTRAVENOUS

## 2018-05-31 ENCOUNTER — Ambulatory Visit: Payer: No Typology Code available for payment source | Admitting: Podiatry

## 2018-11-08 ENCOUNTER — Other Ambulatory Visit: Payer: Self-pay | Admitting: Gastroenterology

## 2018-11-08 DIAGNOSIS — R109 Unspecified abdominal pain: Secondary | ICD-10-CM

## 2018-11-12 ENCOUNTER — Ambulatory Visit
Admission: RE | Admit: 2018-11-12 | Discharge: 2018-11-12 | Disposition: A | Discharge status: Home / Self Care | Payer: No Typology Code available for payment source | Source: Ambulatory Visit | Attending: Gastroenterology | Admitting: Gastroenterology

## 2018-11-12 DIAGNOSIS — R109 Unspecified abdominal pain: Secondary | ICD-10-CM

## 2018-11-12 MED ORDER — IOPAMIDOL (ISOVUE-300) INJECTION 61%
100.0000 mL | Freq: Once | INTRAVENOUS | Status: AC | PRN
  Administered 2018-11-12: 100 mL via INTRAVENOUS

## 2019-04-24 DIAGNOSIS — E1165 Type 2 diabetes mellitus with hyperglycemia: Secondary | ICD-10-CM | POA: Diagnosis not present

## 2019-04-24 DIAGNOSIS — E781 Pure hyperglyceridemia: Secondary | ICD-10-CM | POA: Diagnosis not present

## 2019-04-24 DIAGNOSIS — R51 Headache: Secondary | ICD-10-CM | POA: Diagnosis not present

## 2019-04-24 DIAGNOSIS — I10 Essential (primary) hypertension: Secondary | ICD-10-CM | POA: Diagnosis not present

## 2019-05-13 MED FILL — TOUJEO SOLOSTAR 300 UNITS/M: 300 | 45 days supply | Qty: 14 | Fill #0

## 2019-05-16 ENCOUNTER — Encounter: Payer: Self-pay | Admitting: Pharmacist

## 2019-05-16 ENCOUNTER — Ambulatory Visit (INDEPENDENT_AMBULATORY_CARE_PROVIDER_SITE_OTHER): Payer: No Typology Code available for payment source | Admitting: Pharmacist

## 2019-05-16 ENCOUNTER — Other Ambulatory Visit: Payer: Self-pay

## 2019-05-16 DIAGNOSIS — Z01419 Encounter for gynecological examination (general) (routine) without abnormal findings: Secondary | ICD-10-CM | POA: Diagnosis not present

## 2019-05-16 DIAGNOSIS — Z6826 Body mass index (BMI) 26.0-26.9, adult: Secondary | ICD-10-CM | POA: Diagnosis not present

## 2019-05-16 DIAGNOSIS — Z3202 Encounter for pregnancy test, result negative: Secondary | ICD-10-CM | POA: Diagnosis not present

## 2019-05-16 DIAGNOSIS — Z79899 Other long term (current) drug therapy: Secondary | ICD-10-CM

## 2019-05-16 DIAGNOSIS — Z1231 Encounter for screening mammogram for malignant neoplasm of breast: Secondary | ICD-10-CM | POA: Diagnosis not present

## 2019-05-16 DIAGNOSIS — Z1151 Encounter for screening for human papillomavirus (HPV): Secondary | ICD-10-CM | POA: Diagnosis not present

## 2019-05-16 MED ORDER — HUMIRA PEN 40 MG/0.8ML ~~LOC~~ PNKT: 40.0000 mg | PEN_INJECTOR | SUBCUTANEOUS | 6 refills | Status: DC

## 2019-05-16 MED FILL — TOUJEO SOLOSTAR 300 UNITS/M: 300 | 45 days supply | Qty: 14 | Fill #0

## 2019-05-16 MED FILL — medroxyPROGESTERone ACETATE: 150 | 84 days supply | Qty: 1 | Fill #0

## 2019-05-16 NOTE — Progress Notes (Signed)
  S: Patient presents for review of their specialty medication therapy.  Patient is currently taking Humira for Crohns disease. Patient is managed by Dr. Therisa Doyne for this.   Adherence: denies any missed doses  Efficacy: reports that it works well for her, she has been on it for a while now.  Dosing:  Crohn disease: SubQ (may continue aminosalicylates and/or corticosteroids; if necessary, azathioprine, mercaptopurine, or methotrexate may also be continued): Initial: 160 mg (given as four 40 mg injections on day 1 or given as two 40 mg injections per day over 2 consecutive days), then 80 mg 2 weeks later (day 15). Maintenance: 40 mg every other week beginning day 29. Note: Some patients may require 40 mg every week as maintenance therapy Karl Pock [id number]).   Screening: TB test: Hepatitis:  Monitoring: S/sx of infection: CBC: S/sx of hypersensitivity: S/sx of malignancy: S/sx of heart failure:   Other side effects:   O:     Lab Results  Component Value Date   WBC 8.8 07/23/2017   HGB 14.6 07/23/2017   HCT 42.4 07/23/2017   MCV 78.5 07/23/2017   PLT 227 07/23/2017      Chemistry      Component Value Date/Time   NA 136 07/23/2017 2059   K 3.8 07/23/2017 2059   CL 104 07/23/2017 2059   CO2 24 07/23/2017 2059   BUN 9 07/23/2017 2059   CREATININE 0.75 07/23/2017 2059      Component Value Date/Time   CALCIUM 9.4 07/23/2017 2059   ALKPHOS 70 02/15/2017 0901   AST 12 (L) 02/15/2017 0901   ALT 11 (L) 02/15/2017 0901   BILITOT 0.4 02/15/2017 0901       A/P: 1. Medication review: Patient currently on Humira for Crohn's disease and is tolerating it well with no adverse effects. Reviewed the medication with the patient, including the following: Humira is a TNF blocking agent indicated for ankylosing spondylitis, Crohn's disease, Hidradenitis suppurativa, psoriatic arthritis, plaque psoriasis, ulcerative colitis, and uveitis. Patient educated on purpose, proper use and  potential adverse effects of Humira. Possible adverse effects are increased risk of infections, headache, and injection site reactions. There is the possibility of an increased risk of malignancy but it is not well understood if this increased risk is due to there medication or the disease state. There are rare cases of pancytopenia and aplastic anemia. For SubQ injection at separate sites in the thigh or lower abdomen (avoiding areas within 2 inches of navel); rotate injection sites. May leave at room temperature for ~15 to 30 minutes prior to use; do not remove cap or cover while allowing product to reach room temperature. Do not use if solution is discolored or contains particulate matter. Do not administer to skin which is red, tender, bruised, hard, or that has scars, stretch marks, or psoriasis plaques. Needle cap of the prefilled syringe or needle cover for the adalimumab pen may contain latex. Prefilled pens and syringes are available for use by patients and the full amount of the syringe should be injected (self-administration); the vial is intended for institutional use only. Vials do not contain a preservative; discard unused portion. No recommendations for any changes at this time.  Christella Hartigan, PharmD, BCPS, BCACP, CPP Clinical Pharmacist Practitioner  [phone]

## 2019-05-30 MED FILL — HUMIRA PEN 40 MG/0.8ML PNKT: 40 | 28 days supply | Qty: 2 | Fill #0

## 2019-06-11 DIAGNOSIS — Z20828 Contact with and (suspected) exposure to other viral communicable diseases: Secondary | ICD-10-CM | POA: Diagnosis not present

## 2019-06-21 MED FILL — LOSARTAN POTASSIUM 50 MG TA: 50 | 30 days supply | Qty: 30 | Fill #0

## 2019-06-21 MED FILL — HUMIRA PEN 40 MG/0.8ML PNKT: 40 | 28 days supply | Qty: 2 | Fill #1

## 2019-06-26 MED FILL — ESCITALOPRAM 5 MG TABLET: 5 | 60 days supply | Qty: 120 | Fill #0

## 2019-06-26 MED FILL — SPIRONOLACTONE 50 MG TABS: 50 | 30 days supply | Qty: 30 | Fill #0

## 2019-07-02 DIAGNOSIS — K5090 Crohn's disease, unspecified, without complications: Secondary | ICD-10-CM | POA: Diagnosis not present

## 2019-07-09 MED FILL — FREESTYLE LIBRE 14 DAY SENS: 28 days supply | Qty: 2 | Fill #0

## 2019-07-23 DIAGNOSIS — I10 Essential (primary) hypertension: Secondary | ICD-10-CM | POA: Diagnosis not present

## 2019-07-23 DIAGNOSIS — Z794 Long term (current) use of insulin: Secondary | ICD-10-CM | POA: Diagnosis not present

## 2019-07-23 DIAGNOSIS — E1165 Type 2 diabetes mellitus with hyperglycemia: Secondary | ICD-10-CM | POA: Diagnosis not present

## 2019-07-23 MED FILL — HUMULIN R 500 UNITS/ML KWIK: 500 | 90 days supply | Qty: 36 | Fill #0

## 2019-07-23 MED FILL — HUMIRA PEN 40 MG/0.8ML PNKT: 40 | 28 days supply | Qty: 2 | Fill #2

## 2019-07-23 MED FILL — UNIFINE PENTIPS 32GX5/32: 32G X 4 MM | 90 days supply | Qty: 300 | Fill #0

## 2019-07-23 MED FILL — GABAPENTIN 300 MG CAPSULE: 300 | 90 days supply | Qty: 90 | Fill #0

## 2019-07-23 MED FILL — metFORMIN HCL 500 MG TABS: 500 | 90 days supply | Qty: 90 | Fill #0

## 2019-08-02 DIAGNOSIS — K5010 Crohn's disease of large intestine without complications: Secondary | ICD-10-CM | POA: Diagnosis not present

## 2019-08-02 DIAGNOSIS — I10 Essential (primary) hypertension: Secondary | ICD-10-CM | POA: Diagnosis not present

## 2019-08-02 DIAGNOSIS — F418 Other specified anxiety disorders: Secondary | ICD-10-CM | POA: Diagnosis not present

## 2019-08-02 DIAGNOSIS — Z23 Encounter for immunization: Secondary | ICD-10-CM | POA: Diagnosis not present

## 2019-08-02 MED FILL — LORazepam 1 MG TABS: 1 | 10 days supply | Qty: 10 | Fill #0

## 2019-08-08 MED FILL — LOSARTAN POTASSIUM 50 MG TA: 50 | 30 days supply | Qty: 30 | Fill #1

## 2019-08-08 MED FILL — medroxyPROGESTERone ACETATE: 150 | 84 days supply | Qty: 1 | Fill #1

## 2019-08-09 DIAGNOSIS — Z3042 Encounter for surveillance of injectable contraceptive: Secondary | ICD-10-CM | POA: Diagnosis not present

## 2019-08-16 DIAGNOSIS — N644 Mastodynia: Secondary | ICD-10-CM | POA: Diagnosis not present

## 2019-08-20 MED FILL — SPIRONOLACTONE 50 MG TABS: 50 | 30 days supply | Qty: 30 | Fill #1

## 2019-08-20 MED FILL — HUMIRA PEN 40 MG/0.8ML PNKT: 40 | 28 days supply | Qty: 2 | Fill #3

## 2019-08-20 MED FILL — FREESTYLE LIBRE 14 DAY SENS: 28 days supply | Qty: 2 | Fill #1

## 2019-09-18 MED FILL — ESCITALOPRAM 5 MG TABLET: 5 | 90 days supply | Qty: 180 | Fill #0

## 2019-09-18 MED FILL — LOSARTAN POTASSIUM 50 MG TA: 50 | 30 days supply | Qty: 30 | Fill #2

## 2019-09-18 MED FILL — SPIRONOLACTONE 50 MG TABS: 50 | 30 days supply | Qty: 30 | Fill #2

## 2019-09-18 MED FILL — HUMIRA PEN 40 MG/0.8ML PNKT: 40 | 28 days supply | Qty: 2 | Fill #4

## 2019-10-05 MED FILL — FREESTYLE LIBRE 14 DAY SENS: 28 days supply | Qty: 2 | Fill #2

## 2019-10-07 ENCOUNTER — Ambulatory Visit
Admission: EM | Admit: 2019-10-07 | Discharge: 2019-10-07 | Disposition: A | Discharge status: Home / Self Care | Payer: 59 | Attending: Emergency Medicine | Admitting: Emergency Medicine

## 2019-10-07 DIAGNOSIS — M25551 Pain in right hip: Secondary | ICD-10-CM

## 2019-10-07 MED ORDER — TIZANIDINE HCL 4 MG PO TABS: 4.0000 mg | ORAL_TABLET | Freq: Three times a day (TID) | ORAL | 0 refills | Status: DC | PRN

## 2019-10-07 MED ORDER — ACETAMINOPHEN 325 MG PO TABS
975.0000 mg | ORAL_TABLET | Freq: Once | ORAL | Status: AC
  Administered 2019-10-07: 975 mg via ORAL

## 2019-10-07 MED ORDER — IBUPROFEN 600 MG PO TABS: 600.0000 mg | ORAL_TABLET | Freq: Four times a day (QID) | ORAL | 0 refills | Status: DC | PRN

## 2019-10-07 MED ORDER — KETOROLAC TROMETHAMINE 30 MG/ML IJ SOLN
30.0000 mg | Freq: Once | INTRAMUSCULAR | Status: AC
  Administered 2019-10-07: 30 mg via INTRAMUSCULAR

## 2019-10-07 MED FILL — IBUPROFEN 600 MG TABLET: 600 | 7 days supply | Qty: 30 | Fill #0

## 2019-10-07 MED FILL — tiZANidine HCL 4 MG TABS: 4 | 10 days supply | Qty: 30 | Fill #0

## 2019-10-07 NOTE — Discharge Instructions (Addendum)
600 mg of ibuprofen combined with 1 g of Tylenol 3-4 times a day as needed for pain.  Zanaflex for muscle spasms.  Ice or heat whichever feels better, gentle stretching as I showed you.

## 2019-10-07 NOTE — ED Triage Notes (Signed)
Pt was awoken by spontaneous right hip pain at 5 am this morning

## 2019-10-07 NOTE — ED Provider Notes (Signed)
HPI  SUBJECTIVE:  Cindy Long is a 43 y.o. female who presents with lateral upper right hip pain starting this morning.  States that she woke up with it.  States it radiates down her lateral thigh.  Describes it as constant, throbbing.  States that she was pushing a heavy cart yesterday, but denies other change in her physical activity.  No fevers, body aches, trauma, fall.  No bruising, swelling, distal numbness or tingling, limitation of hip motion.  She is able to bear weight on it.  Denies radicular symptoms.  States that her knee is "okay".  No rash.  She has not tried anything for this.  No alleviating factors.  Symptoms are worse with walking, abduction.  Mildly aggravated with internal/external rotation.  She has never had symptoms like this before.  She has a past medical history of diabetes, IBS, hypertension, chickenpox.  No history of shingles.  LMP: Is on Depo.  Denies the possibility of being pregnant.  PMD: Dr. Seward Carol   Past Medical History:  Diagnosis Date  . Crohn disease ([place])   . Diabetes mellitus    IDDM  . GERD (gastroesophageal reflux disease)    no meds currently  . High cholesterol   . Hypertension   . Mastitis    right breast  . Postpartum care following cesarean delivery (2/10) 12/17/2013    Past Surgical History:  Procedure Laterality Date  . CERVICAL CERCLAGE    . CERVICAL CERCLAGE N/A 06/21/2013   Procedure: Linna Caprice CERVICAL;  Surgeon: Marvene Staff, MD;  Location: [facility];  Service: Gynecology;  Laterality: N/A;  . CESAREAN SECTION    . CESAREAN SECTION N/A 12/17/2013   Procedure: Repeat CESAREAN SECTION with Cerclage Removal;  Surgeon: Marvene Staff, MD;  Location: [facility];  Service: Obstetrics;  Laterality: N/A;  EDD: 12/22/13  . CHOLECYSTECTOMY    . CHOLECYSTECTOMY OPEN  08/2011  . LAPAROSCOPIC ENDOMETRIOSIS FULGURATION  01/2011    Family History  Problem Relation Age of Onset  . Diabetes Maternal Aunt   . Cancer  Maternal Grandmother        breast, colon  . Diabetes Maternal Grandmother     Social History   Tobacco Use  . Smoking status: Never Smoker  . Smokeless tobacco: Never Used  Substance Use Topics  . Alcohol use: Yes    Comment: socailly   . Drug use: No    No current facility-administered medications for this encounter.   Current Outpatient Medications:  .  insulin NPH Human (NOVOLIN N) 100 UNIT/ML injection, Inject into the skin., Disp: , Rfl:  .  Adalimumab (HUMIRA PEN) 40 MG/0.8ML PNKT, Inject 40 mg into the skin every 14 (fourteen) days. (inject as directed), Disp: 2 each, Rfl: 6 .  albuterol (PROVENTIL HFA;VENTOLIN HFA) 108 (90 Base) MCG/ACT inhaler, Inhale 1-2 puffs into the lungs every 6 (six) hours as needed for wheezing or shortness of breath., Disp: , Rfl:  .  azaTHIOprine (IMURAN) 50 MG tablet, Take 50 mg by mouth 2 (two) times daily. , Disp: , Rfl:  .  BuPROPion HCl (WELLBUTRIN PO), Take 100 mg by mouth., Disp: , Rfl:  .  empagliflozin (JARDIANCE) 25 MG TABS tablet, Take 25 mg by mouth daily., Disp: , Rfl:  .  hyoscyamine (LEVSIN, ANASPAZ) 0.125 MG tablet, Take 1-2 tablets (0.125-0.25 mg total) by mouth every 4 (four) hours as needed., Disp: 30 tablet, Rfl: 0 .  ibuprofen (ADVIL) 600 MG tablet, Take 1 tablet (600 mg  total) by mouth every 6 (six) hours as needed., Disp: 30 tablet, Rfl: 0 .  Insulin Human (INSULIN PUMP) SOLN, Inject into the skin., Disp: , Rfl:  .  Insulin Human (INSULIN PUMP) SOLN, Inject 200 each into the skin 3 (three) times daily., Disp: , Rfl:  .  losartan (COZAAR) 25 MG tablet, Take 25 mg by mouth daily., Disp: , Rfl:  .  medroxyPROGESTERone (DEPO-PROVERA) 150 MG/ML injection, Inject 150 mg into the muscle every 3 (three) months., Disp: , Rfl:  .  ondansetron (ZOFRAN ODT) 8 MG disintegrating tablet, Take 1 tablet (8 mg total) by mouth every 8 (eight) hours as needed for nausea or vomiting., Disp: 20 tablet, Rfl: 0 .  tiZANidine (ZANAFLEX) 4 MG tablet,  Take 1 tablet (4 mg total) by mouth every 8 (eight) hours as needed for muscle spasms., Disp: 30 tablet, Rfl: 0  Allergies  Allergen Reactions  . Metronidazole Swelling    Throat swelling  . Shellfish Allergy Swelling    Swelling of the throat     ROS  As noted in HPI.   Physical Exam  BP 119/81   Pulse 95   Temp 98.3 F (36.8 C)   Resp 20   SpO2 97%   Constitutional: Well developed, well nourished, no acute distress Eyes:  EOMI, conjunctiva normal bilaterally HENT: Normocephalic, atraumatic,mucus membranes moist Respiratory: Normal inspiratory effort Cardiovascular: Normal rate GI: nondistended skin: No rash, skin intact Musculoskeletal: No signs of trauma R hip. No bruising, erythema, rash. Diffuse muscular tenderness over the gluteal muscles, lateral hamstring.  No tenderness down IT band, quadriceps.  Pain aggravated with active abduction. no Pain with adduction.  Mild pain with active internal/external rotation of the hip. No tenderness at sciatic notch. Roll test for muscle spasm negative. Flexion/extension knee WNL. Knee joint NT, stable. Motor strength flexion/ext hip 5/5. Sensation to LT intact. DP 2+ Neurologic: Alert & oriented x 3, no focal neuro deficits Psychiatric: Speech and behavior appropriate   ED Course   Medications  ketorolac (TORADOL) 30 MG/ML injection 30 mg (30 mg Intramuscular Given 10/07/19 1644)  acetaminophen (TYLENOL) tablet 975 mg (975 mg Oral Given 10/07/19 1644)    No orders of the defined types were placed in this encounter.   No results found for this or any previous visit (from the past 24 hour(s)). No results found.  ED Clinical Impression  1. Acute pain of right hip      ED Assessment/Plan  Deferred imaging as patient is able to bear weight and actively move hip through full active range of motion.  Could be a strain from pushing the cart.  Also imaging is not available in clinic today. will send home with ibuprofen 600 mg  1 g of Tylenol 3-4 times a day as needed for pain, Zanaflex.  Return to clinic/follow-up with PMD if not better in a week with conservative therapy, rest, stretching.  2-day work note.  Discussed 3MDM, treatment plan, and plan for follow-up with patient. . patient agrees with plan.   Meds ordered this encounter  Medications  . ketorolac (TORADOL) 30 MG/ML injection 30 mg  . acetaminophen (TYLENOL) tablet 975 mg  . ibuprofen (ADVIL) 600 MG tablet    Sig: Take 1 tablet (600 mg total) by mouth every 6 (six) hours as needed.    Dispense:  30 tablet    Refill:  0  . tiZANidine (ZANAFLEX) 4 MG tablet    Sig: Take 1 tablet (4 mg total) by mouth every  8 (eight) hours as needed for muscle spasms.    Dispense:  30 tablet    Refill:  0    *This clinic note was created using [employer]. Therefore, there may be occasional mistakes despite careful proofreading.   ?    Melynda Ripple, MD 10/08/19 1010

## 2019-10-18 MED FILL — SPIRONOLACTONE 50 MG TABS: 50 | 30 days supply | Qty: 30 | Fill #3

## 2019-10-18 MED FILL — HUMIRA PEN 40 MG/0.8ML PNKT: 40 | 28 days supply | Qty: 2 | Fill #5

## 2019-10-18 MED FILL — GABAPENTIN 300 MG CAPSULE: 300 | 90 days supply | Qty: 90 | Fill #1

## 2019-10-18 MED FILL — metFORMIN HCL 500 MG TABS: 500 | 90 days supply | Qty: 90 | Fill #1

## 2019-10-18 MED FILL — LOSARTAN POTASSIUM 50 MG TA: 50 | 30 days supply | Qty: 30 | Fill #3

## 2019-11-04 MED FILL — medroxyPROGESTERone ACETATE: 150 | 84 days supply | Qty: 1 | Fill #2

## 2019-11-05 DIAGNOSIS — Z3042 Encounter for surveillance of injectable contraceptive: Secondary | ICD-10-CM | POA: Diagnosis not present

## 2019-11-09 ENCOUNTER — Encounter (HOSPITAL_COMMUNITY): Payer: Self-pay | Admitting: Emergency Medicine

## 2019-11-09 ENCOUNTER — Emergency Department (HOSPITAL_COMMUNITY)
Admission: EM | Admit: 2019-11-09 | Discharge: 2019-11-10 | Disposition: A | Discharge status: Home / Self Care | Payer: 59 | Attending: Emergency Medicine | Admitting: Emergency Medicine

## 2019-11-09 ENCOUNTER — Other Ambulatory Visit: Payer: Self-pay

## 2019-11-09 DIAGNOSIS — R5381 Other malaise: Secondary | ICD-10-CM | POA: Diagnosis not present

## 2019-11-09 DIAGNOSIS — Z79899 Other long term (current) drug therapy: Secondary | ICD-10-CM | POA: Diagnosis not present

## 2019-11-09 DIAGNOSIS — I10 Essential (primary) hypertension: Secondary | ICD-10-CM | POA: Diagnosis not present

## 2019-11-09 DIAGNOSIS — Z20822 Contact with and (suspected) exposure to covid-19: Secondary | ICD-10-CM | POA: Diagnosis not present

## 2019-11-09 DIAGNOSIS — R509 Fever, unspecified: Secondary | ICD-10-CM | POA: Insufficient documentation

## 2019-11-09 DIAGNOSIS — E119 Type 2 diabetes mellitus without complications: Secondary | ICD-10-CM | POA: Insufficient documentation

## 2019-11-09 DIAGNOSIS — Z794 Long term (current) use of insulin: Secondary | ICD-10-CM | POA: Diagnosis not present

## 2019-11-09 DIAGNOSIS — Z20828 Contact with and (suspected) exposure to other viral communicable diseases: Secondary | ICD-10-CM | POA: Diagnosis not present

## 2019-11-09 LAB — URINALYSIS, ROUTINE W REFLEX MICROSCOPIC
Bilirubin Urine: NEGATIVE
Glucose, UA: >=500 mg/dL — AB
Hgb urine dipstick: NEGATIVE
Ketones, ur: NEGATIVE mg/dL
Leukocytes,Ua: NEGATIVE
Nitrite: NEGATIVE
Protein, ur: NEGATIVE mg/dL
Specific Gravity, Urine: 1.017 (ref 1.005–1.030)
pH: 6 (ref 5.0–8.0)

## 2019-11-09 LAB — COMPREHENSIVE METABOLIC PANEL
ALT: 31 U/L (ref 0–44)
AST: 28 U/L (ref 15–41)
Albumin: 3.9 g/dL (ref 3.5–5.0)
Alkaline Phosphatase: 66 U/L (ref 38–126)
Anion gap: 13 (ref 5–15)
BUN: 7 mg/dL (ref 6–20)
CO2: 20 mmol/L — ABNORMAL LOW (ref 22–32)
Calcium: 9.3 mg/dL (ref 8.9–10.3)
Chloride: 102 mmol/L (ref 98–111)
Creatinine, Ser: 0.67 mg/dL (ref 0.44–1.00)
GFR calc Af Amer: >60 mL/min (ref >60)
GFR calc non Af Amer: >60 mL/min (ref >60)
Glucose, Bld: 295 mg/dL — ABNORMAL HIGH (ref 70–99)
Potassium: 4 mmol/L (ref 3.5–5.1)
Sodium: 135 mmol/L (ref 135–145)
Total Bilirubin: 0.5 mg/dL (ref 0.3–1.2)
Total Protein: 7.7 g/dL (ref 6.5–8.1)

## 2019-11-09 LAB — PREGNANCY, URINE: Preg Test, Ur: NEGATIVE

## 2019-11-09 LAB — CBC WITH DIFFERENTIAL/PLATELET
Abs Immature Granulocytes: 0.03 10*3/uL (ref 0.00–0.07)
Basophils Absolute: 0 10*3/uL (ref 0.0–0.1)
Basophils Relative: 0 %
Eosinophils Absolute: 0 10*3/uL (ref 0.0–0.5)
Eosinophils Relative: 0 %
HCT: 47 % — ABNORMAL HIGH (ref 36.0–46.0)
Hemoglobin: 15.2 g/dL — ABNORMAL HIGH (ref 12.0–15.0)
Immature Granulocytes: 1 %
Lymphocytes Relative: 26 %
Lymphs Abs: 1.4 10*3/uL (ref 0.7–4.0)
MCH: 27.2 pg (ref 26.0–34.0)
MCHC: 32.3 g/dL (ref 30.0–36.0)
MCV: 84.2 fL (ref 80.0–100.0)
Monocytes Absolute: 0.4 10*3/uL (ref 0.1–1.0)
Monocytes Relative: 7 %
Neutro Abs: 3.4 10*3/uL (ref 1.7–7.7)
Neutrophils Relative %: 66 %
Platelets: 196 10*3/uL (ref 150–400)
RBC: 5.58 MIL/uL — ABNORMAL HIGH (ref 3.87–5.11)
RDW: 12.8 % (ref 11.5–15.5)
WBC: 5 10*3/uL (ref 4.0–10.5)
nRBC: 0 % (ref 0.0–0.2)

## 2019-11-09 LAB — LACTIC ACID, PLASMA
Lactic Acid, Venous: 4.6 mmol/L (ref 0.5–1.9)
Lactic Acid, Venous: 3 mmol/L (ref 0.5–1.9)

## 2019-11-09 LAB — INFLUENZA PANEL BY PCR (TYPE A & B)
Influenza A By PCR: NEGATIVE
Influenza B By PCR: NEGATIVE

## 2019-11-09 LAB — LIPASE, BLOOD: Lipase: 30 U/L (ref 11–51)

## 2019-11-09 LAB — POC SARS CORONAVIRUS 2 AG -  ED: SARS Coronavirus 2 Ag: NEGATIVE

## 2019-11-09 MED ORDER — SODIUM CHLORIDE 0.9 % IV BOLUS
1000.0000 mL | Freq: Once | INTRAVENOUS | Status: AC
  Administered 2019-11-09: 1000 mL via INTRAVENOUS

## 2019-11-09 MED ORDER — ACETAMINOPHEN 325 MG PO TABS
650.0000 mg | ORAL_TABLET | Freq: Once | ORAL | Status: AC
  Administered 2019-11-09: 650 mg via ORAL
  Filled 2019-11-09: qty 2

## 2019-11-09 MED ORDER — METOCLOPRAMIDE HCL 5 MG/ML IJ SOLN
10.0000 mg | Freq: Once | INTRAMUSCULAR | Status: AC
  Administered 2019-11-09: 10 mg via INTRAVENOUS
  Filled 2019-11-09: qty 2

## 2019-11-09 NOTE — Discharge Instructions (Signed)
You have been evaluated for your symptoms, which suggest a viral illness.  Your COVID-19 test is currently pending but will likely result in the next 24 hrs.  Check Mychart for result.  In the mean time, stay hydrated, take tylenol or ibuprofen as needed for fever and/or pain.  Follow instruction below.  Follow up with your GI specialist.  Return if you have any concerns.

## 2019-11-09 NOTE — ED Notes (Signed)
Date and time results received: 11/09/19 8:26 PM(use smartphrase ".now" to insert current time)  Test: lactic acid Critical Value: 4.6  Name of Provider Notified: Dr Kathrynn Humble  Orders Received? Or Actions Taken?: see chart

## 2019-11-09 NOTE — ED Provider Notes (Signed)
[facility] EMERGENCY DEPARTMENT Provider Note   CSN: [id number] Arrival date & time: 11/09/19  1535     History Chief Complaint  Patient presents with  . Fever    Cindy Long is a 44 y.o. female with h/o diabetes on metformin, chron's on humira, HTN presents to ER for evaluation of fever associated with generalized malaise, body aches, cold chills, nausea, diarrhea, headache, mild "tickle" cough and back pain.  States she started feeling off yesterday. Today at work noticed her heart rate was in the 130s.  Low grade fever on arrival to ER.  She had three episodes of non bloody non melenotic diarrhea with mucus x 3 yesterday but none today.  She took phenergan at home for nausea.  No other interventions.  She denies congestion, sore throat, CP, SOB, vomiting, abdominal pain. Her left flank pain is mild, constant, positional.  No dysuria, hematuria or other UTi symptoms. No sick contacts at home.  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  She had COVID vaccine on Monday of this week and felt fine after administration.  Has had one Chron's flare several years ago with mucoid diarrhea. Has been compliant with Humira.   HPI     Past Medical History:  Diagnosis Date  . Crohn disease ([place])   . Diabetes mellitus    IDDM  . GERD (gastroesophageal reflux disease)    no meds currently  . High cholesterol   . Hypertension   . Mastitis    right breast  . Postpartum care following cesarean delivery (2/10) 12/17/2013    Patient Active Problem List   Diagnosis Date Noted  . Postpartum care following cesarean delivery (2/10) 12/17/2013  . Indication for care in labor or delivery 11/30/2013  . IBS (irritable bowel syndrome) 02/05/2013  . Other and unspecified hyperlipidemia 02/05/2013    Past Surgical History:  Procedure Laterality Date  . CERVICAL CERCLAGE    . CERVICAL CERCLAGE N/A 06/21/2013   Procedure: Linna Caprice CERVICAL;  Surgeon:  Marvene Staff, MD;  Location: [facility];  Service: Gynecology;  Laterality: N/A;  . CESAREAN SECTION    . CESAREAN SECTION N/A 12/17/2013   Procedure: Repeat CESAREAN SECTION with Cerclage Removal;  Surgeon: Marvene Staff, MD;  Location: [facility];  Service: Obstetrics;  Laterality: N/A;  EDD: 12/22/13  . CHOLECYSTECTOMY    . CHOLECYSTECTOMY OPEN  08/2011  . LAPAROSCOPIC ENDOMETRIOSIS FULGURATION  01/2011     OB History    Gravida  2   Para  2   Term  2   Preterm      AB      Living  2     SAB      TAB      Ectopic      Multiple      Live Births  2           Family History  Problem Relation Age of Onset  . Diabetes Maternal Aunt   . Cancer Maternal Grandmother        breast, colon  . Diabetes Maternal Grandmother     Social History   Tobacco Use  . Smoking status: Never Smoker  . Smokeless tobacco: Never Used  Substance Use Topics  . Alcohol use: Yes    Comment: socailly   . Drug use: No    Home Medications Prior to Admission medications   Medication Sig Start Date End Date Taking? Authorizing Provider  Adalimumab (HUMIRA PEN) 40 MG/0.8ML PNKT Inject 40 mg into the skin every 14 (fourteen) days. (inject as directed) 05/16/19   Tresa Garter, MD  albuterol (PROVENTIL HFA;VENTOLIN HFA) 108 (90 Base) MCG/ACT inhaler Inhale 1-2 puffs into the lungs every 6 (six) hours as needed for wheezing or shortness of breath.    [provider]  azaTHIOprine (IMURAN) 50 MG tablet Take 50 mg by mouth 2 (two) times daily.     [provider]  BuPROPion HCl (WELLBUTRIN PO) Take 100 mg by mouth.    [provider]  empagliflozin (JARDIANCE) 25 MG TABS tablet Take 25 mg by mouth daily.    [provider]  hyoscyamine (LEVSIN, ANASPAZ) 0.125 MG tablet Take 1-2 tablets (0.125-0.25 mg total) by mouth every 4 (four) hours as needed. 02/15/17   Evalee Jefferson, PA-C  ibuprofen (ADVIL) 600 MG tablet Take 1 tablet (600 mg total) by mouth  every 6 (six) hours as needed. 10/07/19   Melynda Ripple, MD  Insulin Human (INSULIN PUMP) SOLN Inject into the skin.    [provider]  Insulin Human (INSULIN PUMP) SOLN Inject 200 each into the skin 3 (three) times daily.    [provider]  insulin NPH Human (NOVOLIN N) 100 UNIT/ML injection Inject into the skin.    [provider]  losartan (COZAAR) 25 MG tablet Take 25 mg by mouth daily.    [provider]  medroxyPROGESTERone (DEPO-PROVERA) 150 MG/ML injection Inject 150 mg into the muscle every 3 (three) months.    [provider]  ondansetron (ZOFRAN ODT) 8 MG disintegrating tablet Take 1 tablet (8 mg total) by mouth every 8 (eight) hours as needed for nausea or vomiting. 02/15/17   Idol, Almyra Free, PA-C  tiZANidine (ZANAFLEX) 4 MG tablet Take 1 tablet (4 mg total) by mouth every 8 (eight) hours as needed for muscle spasms. 10/07/19   Melynda Ripple, MD  dicyclomine (BENTYL) 20 MG tablet Take 1 tablet (20 mg total) by mouth every 8 (eight) hours as needed for spasms (abdominal pain). 06/01/16 10/07/19  Jola Schmidt, MD    Allergies    Metronidazole and Shellfish allergy  Review of Systems   Review of Systems  Constitutional: Positive for chills, fatigue and fever.  Respiratory: Positive for cough.   Gastrointestinal: Positive for diarrhea and nausea.  Genitourinary: Positive for flank pain.  Musculoskeletal: Positive for myalgias.  Neurological: Positive for headaches.  All other systems reviewed and are negative.   Physical Exam Updated Vital Signs BP 136/74 (BP Location: Right Arm)   Pulse (!) 104   Temp (!) 101.4 F (38.6 C) (Oral)   Resp 18   Ht 5' 7"  (1.702 m)   Wt 80.3 kg   SpO2 97%   BMI 27.72 kg/m   Physical Exam Vitals and nursing note reviewed.  Constitutional:      Appearance: She is well-developed.     Comments: Well appearing, non toxic in NAD  HENT:     Head: Normocephalic and atraumatic.     Comments:  No temporal tenderness     Nose: Nose normal.  Eyes:     Conjunctiva/sclera: Conjunctivae normal.  Neck:     Comments: Non tender neck. Full ROM without rigidity, pain, meningismus.  Cardiovascular:     Rate and Rhythm: Normal rate and regular rhythm.  Pulmonary:     Effort: Pulmonary effort is normal.     Breath sounds: Normal breath sounds.  Abdominal:     General: Bowel  sounds are normal.     Palpations: Abdomen is soft.     Tenderness: There is no abdominal tenderness. There is left CVA tenderness.     Comments: Mild pain reported with left low CVA percussion and palpation, pain noted with movement.  No anterior abd tenderness. No G/R/R. No suprapubic tenderness. Negative Murphy's and McBurney's.   Musculoskeletal:        General: Normal range of motion.     Cervical back: Normal range of motion.  Skin:    General: Skin is warm and dry.     Capillary Refill: Capillary refill takes less than 2 seconds.  Neurological:     Mental Status: She is alert.  Psychiatric:        Behavior: Behavior normal.     ED Results / Procedures / Treatments   Labs (all labs ordered are listed, but only abnormal results are displayed) Labs Reviewed  CBC WITH DIFFERENTIAL/PLATELET - Abnormal; Notable for the following components:      Result Value   RBC 5.58 (*)    Hemoglobin 15.2 (*)    HCT 47.0 (*)    All other components within normal limits  COMPREHENSIVE METABOLIC PANEL - Abnormal; Notable for the following components:   CO2 20 (*)    Glucose, Bld 295 (*)    All other components within normal limits  URINALYSIS, ROUTINE W REFLEX MICROSCOPIC - Abnormal; Notable for the following components:   Color, Urine STRAW (*)    Glucose, UA >=500 (*)    Bacteria, UA RARE (*)    All other components within normal limits  LACTIC ACID, PLASMA - Abnormal; Notable for the following components:   Lactic Acid, Venous 4.6 (*)    All other components within normal limits  LACTIC ACID, PLASMA -  Abnormal; Notable for the following components:   Lactic Acid, Venous 3.0 (*)    All other components within normal limits  URINE CULTURE  SARS CORONAVIRUS 2 (TAT 6-24 HRS)  LIPASE, BLOOD  PREGNANCY, URINE  INFLUENZA PANEL BY PCR (TYPE A & B)  POC SARS CORONAVIRUS 2 AG -  ED    EKG None  Radiology No results found.  Procedures Procedures (including critical care time)  Medications Ordered in ED Medications  sodium chloride 0.9 % bolus 1,000 mL (0 mLs Intravenous Stopped 11/09/19 2050)  acetaminophen (TYLENOL) tablet 650 mg (650 mg Oral Given 11/09/19 1952)  metoCLOPramide (REGLAN) injection 10 mg (10 mg Intravenous Given 11/09/19 1953)  sodium chloride 0.9 % bolus 1,000 mL (1,000 mLs Intravenous New Bag/Given 11/09/19 2229)    ED Course  I have reviewed the triage vital signs and the nursing notes.  Pertinent labs & imaging results that were available during my care of the patient were reviewed by me and considered in my medical decision making (see chart for details).    MDM Rules/Calculators/A&P                       44 yo F here with fever, cough, headache, body aches, nausea, diarrhea.  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 Received COVID vaccine #1 6 days ago and had to immediate side effects or complications.    Highest on ddx is viral syndrome given URI symptoms, headache, body aches, diarrhea, job.  She has no signs of meningitis, and given overall picture with cough/diarrhea, this is less likely. She has h/o chron's but diarrhea stopped yesterday and she  has no anterior abd tenderness. She has left CVAT without UTI symptoms either.    Labs personally reviewed show hyperglycemia without AG/DKA.  Isolated initial lactic acidosis 4.6.  No leukocytosis.  No signs of end organ damage, normal creatinine. UA negative. POC COVID and influenza negative.  Will need outpatient 24 hr COVID test. No significant cough, CP, SOB and lungs CTAB I  don't think CXR is necessary.   She arrived tachycardic in setting of fever.  Initial lactic acid 4.6 but other labs vastly reassuring/normal.  Overall well appearing without hypotension.  Tachycardia has improved after 1 L IVF.  No clinical decline on re-evaluation, she feels better.  Will defer antibiotics and aggressive IVF resuscitation given improving tachycardia, normal BP and no other signs of EOD.  Although initial lactic acid elevated, overall she is non toxic appearing. She does not appear septic or ill.  2255: Will hand pt off to oncoming EDPA who will f/u on repeat lactic after 2nd L IVF.  Anticipate discharge if lactic acid trending down.  Discussed with patient symptoms likely viral in nature.  I don't think emergent CTAP is necessary given benign abd exam and no longer having diarrhea, compliance with Humira. Chron's flare less likely with headache, cough.  Consider CTAP if new onset ad pain, clinical decline.  Pt aware of hand of care. She is comfortable deferring CTAP today. She is to call [facility] on Monday if diarrhea returns. She knows how to check Mycchart for pending COVID test result. Return precautions given. She is comfortable with this.    Final Clinical Impression(s) / ED Diagnoses Final diagnoses:  Fever in adult    Rx / DC Orders ED Discharge Orders    None       Kinnie Feil, PA-C 11/09/19 2301    Varney Biles, MD 11/11/19 1451

## 2019-11-09 NOTE — ED Notes (Signed)
covid vaccine earlier in the week   Here for eval for general body aches

## 2019-11-09 NOTE — ED Provider Notes (Signed)
Received sign out from Carmon Sails, [place].  Pt here with body aches and fever.  Recently had Covid Vaccine 5 days ago.  Initial covid rapid test is negative, a PCR test has been sent out.  Pt given tylenol and IVF.  Plan to continue with hydration, repeat lactic acid and anticipate discharge home with symptomatic management.    Does have hx of Crohn's disease, follows by [facility].  If lactic acid not improve, may consider abd/pelvis CT.  Doubt meningitis.   11:28 PM On examination, patient is well-appearing, no nuchal rigidity, abdomen is soft nontender, lungs clear on auscultation and patient is mentating appropriately.  Her lactic acid did improve from 4.6 to 3.0.  Flu test is negative.  UA without signs of UTI.  Urine culture pending.  Pregnancy test is negative.  Normal WBC.  Glucose is elevated at 295 however normal anion gap.  I did offer chest x-ray but patient declined.  COVID-19 PCR test is currently pending.  BP (!) 129/92 (BP Location: Left Arm)   Pulse 94   Temp 98.6 F (37 C)   Resp 20   Ht 5' 7"  (1.702 m)   Wt 80.3 kg   SpO2 97%   BMI 27.72 kg/m   Results for orders placed or performed during the hospital encounter of 11/09/19  CBC with Differential  Result Value Ref Range   WBC 5.0 4.0 - 10.5 K/uL   RBC 5.58 (H) 3.87 - 5.11 MIL/uL   Hemoglobin 15.2 (H) 12.0 - 15.0 g/dL   HCT 47.0 (H) 36.0 - 46.0 %   MCV 84.2 80.0 - 100.0 fL   MCH 27.2 26.0 - 34.0 pg   MCHC 32.3 30.0 - 36.0 g/dL   RDW 12.8 11.5 - 15.5 %   Platelets 196 150 - 400 K/uL   nRBC 0.0 0.0 - 0.2 %   Neutrophils Relative % 66 %   Neutro Abs 3.4 1.7 - 7.7 K/uL   Lymphocytes Relative 26 %   Lymphs Abs 1.4 0.7 - 4.0 K/uL   Monocytes Relative 7 %   Monocytes Absolute 0.4 0.1 - 1.0 K/uL   Eosinophils Relative 0 %   Eosinophils Absolute 0.0 0.0 - 0.5 K/uL   Basophils Relative 0 %   Basophils Absolute 0.0 0.0 - 0.1 K/uL   Immature Granulocytes 1 %   Abs Immature Granulocytes 0.03 0.00 - 0.07 K/uL   Comprehensive metabolic panel  Result Value Ref Range   Sodium 135 135 - 145 mmol/L   Potassium 4.0 3.5 - 5.1 mmol/L   Chloride 102 98 - 111 mmol/L   CO2 20 (L) 22 - 32 mmol/L   Glucose, Bld 295 (H) 70 - 99 mg/dL   BUN 7 6 - 20 mg/dL   Creatinine, Ser 0.67 0.44 - 1.00 mg/dL   Calcium 9.3 8.9 - 10.3 mg/dL   Total Protein 7.7 6.5 - 8.1 g/dL   Albumin 3.9 3.5 - 5.0 g/dL   AST 28 15 - 41 U/L   ALT 31 0 - 44 U/L   Alkaline Phosphatase 66 38 - 126 U/L   Total Bilirubin 0.5 0.3 - 1.2 mg/dL   GFR calc non Af Amer >60 >60 mL/min   GFR calc Af Amer >60 >60 mL/min   Anion gap 13 5 - 15  Lipase, blood  Result Value Ref Range   Lipase 30 11 - 51 U/L  Urinalysis, Routine w reflex microscopic  Result Value Ref Range   Color, Urine STRAW (A)  YELLOW   APPearance CLEAR CLEAR   Specific Gravity, Urine 1.017 1.005 - 1.030   pH 6.0 5.0 - 8.0   Glucose, UA >=500 (A) NEGATIVE mg/dL   Hgb urine dipstick NEGATIVE NEGATIVE   Bilirubin Urine NEGATIVE NEGATIVE   Ketones, ur NEGATIVE NEGATIVE mg/dL   Protein, ur NEGATIVE NEGATIVE mg/dL   Nitrite NEGATIVE NEGATIVE   Leukocytes,Ua NEGATIVE NEGATIVE   RBC / HPF 0-5 0 - 5 RBC/hpf   WBC, UA 0-5 0 - 5 WBC/hpf   Bacteria, UA RARE (A) NONE SEEN   Squamous Epithelial / LPF 0-5 0 - 5  Lactic acid, plasma  Result Value Ref Range   Lactic Acid, Venous 4.6 (HH) 0.5 - 1.9 mmol/L  Lactic acid, plasma  Result Value Ref Range   Lactic Acid, Venous 3.0 (HH) 0.5 - 1.9 mmol/L  Pregnancy, urine  Result Value Ref Range   Preg Test, Ur NEGATIVE NEGATIVE  Influenza panel by PCR (type A & B)  Result Value Ref Range   Influenza A By PCR NEGATIVE NEGATIVE   Influenza B By PCR NEGATIVE NEGATIVE  POC SARS Coronavirus 2 Ag-ED -  Result Value Ref Range   SARS Coronavirus 2 Ag NEGATIVE NEGATIVE   No results found.      Domenic Moras, PA-C 11/09/19 [place], Ankit, MD 11/11/19 [phone]

## 2019-11-09 NOTE — ED Triage Notes (Signed)
Pt having body aches, fever, and generally just feeling bad since yesterday. Pt works at [employer], got Covid vaccine Monday.

## 2019-11-09 NOTE — ED Notes (Signed)
Date and time results received: 11/09/19 2158  Test: lactic Critical Value: 3.0  Name of Provider Notified: Rosemarie Ax, [place]  Orders Received? Or Actions Taken?: acknowledged

## 2019-11-09 NOTE — ED Notes (Signed)
Flu covid collected as ordered

## 2019-11-09 NOTE — ED Notes (Signed)
Pt reports she has urinated since her arrival in dept   Is taking pos well   Iv est labs drawn   awaiting pt to urinate

## 2019-11-11 LAB — URINE CULTURE: Culture: NO GROWTH

## 2019-11-11 MED FILL — FREESTYLE LIBRE 14 DAY SENS: 28 days supply | Qty: 2 | Fill #3

## 2019-11-12 ENCOUNTER — Emergency Department (HOSPITAL_COMMUNITY): Payer: 59

## 2019-11-12 ENCOUNTER — Emergency Department (HOSPITAL_COMMUNITY)
Admission: EM | Admit: 2019-11-12 | Discharge: 2019-11-12 | Disposition: A | Discharge status: Home / Self Care | Payer: 59 | Attending: Emergency Medicine | Admitting: Emergency Medicine

## 2019-11-12 ENCOUNTER — Encounter (HOSPITAL_COMMUNITY): Payer: Self-pay | Admitting: Emergency Medicine

## 2019-11-12 ENCOUNTER — Other Ambulatory Visit: Payer: Self-pay

## 2019-11-12 DIAGNOSIS — E1165 Type 2 diabetes mellitus with hyperglycemia: Secondary | ICD-10-CM | POA: Diagnosis not present

## 2019-11-12 DIAGNOSIS — R112 Nausea with vomiting, unspecified: Secondary | ICD-10-CM | POA: Diagnosis not present

## 2019-11-12 DIAGNOSIS — Z79899 Other long term (current) drug therapy: Secondary | ICD-10-CM | POA: Diagnosis not present

## 2019-11-12 DIAGNOSIS — R197 Diarrhea, unspecified: Secondary | ICD-10-CM

## 2019-11-12 DIAGNOSIS — Z20822 Contact with and (suspected) exposure to covid-19: Secondary | ICD-10-CM | POA: Diagnosis not present

## 2019-11-12 DIAGNOSIS — K5010 Crohn's disease of large intestine without complications: Secondary | ICD-10-CM | POA: Diagnosis not present

## 2019-11-12 DIAGNOSIS — I10 Essential (primary) hypertension: Secondary | ICD-10-CM | POA: Diagnosis not present

## 2019-11-12 DIAGNOSIS — E119 Type 2 diabetes mellitus without complications: Secondary | ICD-10-CM | POA: Insufficient documentation

## 2019-11-12 DIAGNOSIS — Z794 Long term (current) use of insulin: Secondary | ICD-10-CM | POA: Insufficient documentation

## 2019-11-12 DIAGNOSIS — R109 Unspecified abdominal pain: Secondary | ICD-10-CM | POA: Insufficient documentation

## 2019-11-12 DIAGNOSIS — N390 Urinary tract infection, site not specified: Secondary | ICD-10-CM | POA: Insufficient documentation

## 2019-11-12 LAB — COMPREHENSIVE METABOLIC PANEL
ALT: 25 U/L (ref 0–44)
AST: 21 U/L (ref 15–41)
Albumin: 4 g/dL (ref 3.5–5.0)
Alkaline Phosphatase: 65 U/L (ref 38–126)
Anion gap: 11 (ref 5–15)
BUN: 5 mg/dL — ABNORMAL LOW (ref 6–20)
CO2: 22 mmol/L (ref 22–32)
Calcium: 9.4 mg/dL (ref 8.9–10.3)
Chloride: 104 mmol/L (ref 98–111)
Creatinine, Ser: 0.54 mg/dL (ref 0.44–1.00)
GFR calc Af Amer: >60 mL/min (ref >60)
GFR calc non Af Amer: >60 mL/min (ref >60)
Glucose, Bld: 152 mg/dL — ABNORMAL HIGH (ref 70–99)
Potassium: 3.2 mmol/L — ABNORMAL LOW (ref 3.5–5.1)
Sodium: 137 mmol/L (ref 135–145)
Total Bilirubin: 0.7 mg/dL (ref 0.3–1.2)
Total Protein: 8.4 g/dL — ABNORMAL HIGH (ref 6.5–8.1)

## 2019-11-12 LAB — CBC WITH DIFFERENTIAL/PLATELET
Abs Immature Granulocytes: 0.05 K/uL (ref 0.00–0.07)
Basophils Absolute: 0 K/uL (ref 0.0–0.1)
Basophils Relative: 0 %
Eosinophils Absolute: 0 K/uL (ref 0.0–0.5)
Eosinophils Relative: 0 %
HCT: 48.5 % — ABNORMAL HIGH (ref 36.0–46.0)
Hemoglobin: 15.9 g/dL — ABNORMAL HIGH (ref 12.0–15.0)
Immature Granulocytes: 1 %
Lymphocytes Relative: 35 %
Lymphs Abs: 2 K/uL (ref 0.7–4.0)
MCH: 27 pg (ref 26.0–34.0)
MCHC: 32.8 g/dL (ref 30.0–36.0)
MCV: 82.5 fL (ref 80.0–100.0)
Monocytes Absolute: 0.3 K/uL (ref 0.1–1.0)
Monocytes Relative: 5 %
Neutro Abs: 3.5 K/uL (ref 1.7–7.7)
Neutrophils Relative %: 59 %
Platelets: 224 K/uL (ref 150–400)
RBC: 5.88 MIL/uL — ABNORMAL HIGH (ref 3.87–5.11)
RDW: 12.7 % (ref 11.5–15.5)
WBC: 5.9 K/uL (ref 4.0–10.5)
nRBC: 0 % (ref 0.0–0.2)

## 2019-11-12 LAB — URINALYSIS, ROUTINE W REFLEX MICROSCOPIC
Bilirubin Urine: NEGATIVE
Glucose, UA: 50 mg/dL — AB
Hgb urine dipstick: NEGATIVE
Ketones, ur: NEGATIVE mg/dL
Nitrite: NEGATIVE
Protein, ur: NEGATIVE mg/dL
Specific Gravity, Urine: 1.019 (ref 1.005–1.030)
pH: 6 (ref 5.0–8.0)

## 2019-11-12 LAB — I-STAT BETA HCG BLOOD, ED (MC, WL, AP ONLY): I-stat hCG, quantitative: <5 m[IU]/mL (ref <5)

## 2019-11-12 LAB — LACTIC ACID, PLASMA
Lactic Acid, Venous: 1.6 mmol/L (ref 0.5–1.9)
Lactic Acid, Venous: 1 mmol/L (ref 0.5–1.9)

## 2019-11-12 MED ORDER — SODIUM CHLORIDE 0.9 % IV SOLN
1.0000 g | Freq: Once | INTRAVENOUS | Status: AC
  Administered 2019-11-12: 1 g via INTRAVENOUS
  Filled 2019-11-12: qty 10

## 2019-11-12 MED ORDER — ONDANSETRON HCL 4 MG/2ML IJ SOLN
4.0000 mg | Freq: Once | INTRAMUSCULAR | Status: AC
  Administered 2019-11-12: 4 mg via INTRAVENOUS
  Filled 2019-11-12: qty 2

## 2019-11-12 MED ORDER — HYDROMORPHONE HCL 1 MG/ML IJ SOLN
1.0000 mg | Freq: Once | INTRAMUSCULAR | Status: AC
  Administered 2019-11-12: 1 mg via INTRAVENOUS
  Filled 2019-11-12: qty 1

## 2019-11-12 MED ORDER — ONDANSETRON HCL 4 MG PO TABS: 4.0000 mg | ORAL_TABLET | Freq: Four times a day (QID) | ORAL | 0 refills | Status: DC

## 2019-11-12 MED ORDER — HYDROCODONE-ACETAMINOPHEN 5-325 MG PO TABS: 1.0000 | ORAL_TABLET | Freq: Four times a day (QID) | ORAL | 0 refills | Status: DC | PRN

## 2019-11-12 MED ORDER — LACTATED RINGERS IV BOLUS
1000.0000 mL | Freq: Once | INTRAVENOUS | Status: AC
  Administered 2019-11-12: 1000 mL via INTRAVENOUS

## 2019-11-12 MED ORDER — POTASSIUM CHLORIDE 20 MEQ PO PACK
40.0000 meq | PACK | Freq: Once | ORAL | Status: AC
  Administered 2019-11-12: 40 meq via ORAL
  Filled 2019-11-12: qty 2

## 2019-11-12 MED ORDER — CEPHALEXIN 500 MG PO CAPS: 500.0000 mg | ORAL_CAPSULE | Freq: Three times a day (TID) | ORAL | 0 refills | Status: DC

## 2019-11-12 MED ORDER — IOHEXOL 300 MG/ML  SOLN
100.0000 mL | Freq: Once | INTRAMUSCULAR | Status: AC | PRN
  Administered 2019-11-12: 14:00:00 100 mL via INTRAVENOUS

## 2019-11-12 NOTE — ED Triage Notes (Addendum)
Patient complaining of continuing diarrhea and nausea for over 1 week. States she was treated here Saturday for same. States she had virtual visit today and was told by PCP to come to ER for fluids. Patient coughing in triage. States cough and scratchy throat started yesterday. 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 States covid test on Saturday was negative.

## 2019-11-12 NOTE — ED Provider Notes (Signed)
[facility] EMERGENCY DEPARTMENT Provider Note   CSN: [id number] Arrival date & time: 11/12/19  1114     History Chief Complaint  Patient presents with  . Diarrhea    Cindy Long is a 44 y.o. female.  HPI   44 year old female with abdominal pain, diarrhea and nausea.  Onset about a week ago.  Patient was seen in the emergency room on Saturday for similar symptoms.  Treated with improvement of symptoms.  Symptoms worsen again shortly after discharge.  Multiple loose to watery stools per day.  No blood.  No fevers or chills.  Diffuse abdominal pain, somewhat worse in the lower abdomen.  Really does not lateralize.  Waxes and wanes but does not completely resolve.  Nauseated.  No vomiting.  Occasional cough.  No shortness of breath.  Subjective fevers.  She works in a nursing facility.  She was checked for Covid on Saturday and this was negative.  Past Medical History:  Diagnosis Date  . Crohn disease ([place])   . Diabetes mellitus    IDDM  . GERD (gastroesophageal reflux disease)    no meds currently  . High cholesterol   . Hypertension   . Mastitis    right breast  . Postpartum care following cesarean delivery (2/10) 12/17/2013    Patient Active Problem List   Diagnosis Date Noted  . Postpartum care following cesarean delivery (2/10) 12/17/2013  . Indication for care in labor or delivery 11/30/2013  . IBS (irritable bowel syndrome) 02/05/2013  . Other and unspecified hyperlipidemia 02/05/2013    Past Surgical History:  Procedure Laterality Date  . CERVICAL CERCLAGE    . CERVICAL CERCLAGE N/A 06/21/2013   Procedure: Linna Caprice CERVICAL;  Surgeon: Marvene Staff, MD;  Location: [facility];  Service: Gynecology;  Laterality: N/A;  . CESAREAN SECTION    . CESAREAN SECTION N/A 12/17/2013   Procedure: Repeat CESAREAN SECTION with Cerclage Removal;  Surgeon: Marvene Staff, MD;  Location: [facility];  Service: Obstetrics;  Laterality: N/A;  EDD: 12/22/13  .  CHOLECYSTECTOMY    . CHOLECYSTECTOMY OPEN  08/2011  . LAPAROSCOPIC ENDOMETRIOSIS FULGURATION  01/2011     OB History    Gravida  2   Para  2   Term  2   Preterm      AB      Living  2     SAB      TAB      Ectopic      Multiple      Live Births  2           Family History  Problem Relation Age of Onset  . Diabetes Maternal Aunt   . Cancer Maternal Grandmother        breast, colon  . Diabetes Maternal Grandmother     Social History   Tobacco Use  . Smoking status: Never Smoker  . Smokeless tobacco: Never Used  Substance Use Topics  . Alcohol use: Yes    Comment: occasionally  . Drug use: No    Home Medications Prior to Admission medications   Medication Sig Start Date End Date Taking? Authorizing Provider  Adalimumab (HUMIRA PEN) 40 MG/0.8ML PNKT Inject 40 mg into the skin every 14 (fourteen) days. (inject as directed) 05/16/19   Tresa Garter, MD  albuterol (PROVENTIL HFA;VENTOLIN HFA) 108 (90 Base) MCG/ACT inhaler Inhale 1-2 puffs into the lungs every 6 (six) hours as needed for wheezing or shortness of breath.  [provider]  azaTHIOprine (IMURAN) 50 MG tablet Take 50 mg by mouth 2 (two) times daily.     [provider]  BuPROPion HCl (WELLBUTRIN PO) Take 100 mg by mouth.    [provider]  empagliflozin (JARDIANCE) 25 MG TABS tablet Take 25 mg by mouth daily.    [provider]  hyoscyamine (LEVSIN, ANASPAZ) 0.125 MG tablet Take 1-2 tablets (0.125-0.25 mg total) by mouth every 4 (four) hours as needed. 02/15/17   Evalee Jefferson, PA-C  ibuprofen (ADVIL) 600 MG tablet Take 1 tablet (600 mg total) by mouth every 6 (six) hours as needed. 10/07/19   Melynda Ripple, MD  Insulin Human (INSULIN PUMP) SOLN Inject into the skin.    [provider]  Insulin Human (INSULIN PUMP) SOLN Inject 200 each into the skin 3 (three) times daily.    [provider]  insulin NPH Human (NOVOLIN N) 100 UNIT/ML  injection Inject into the skin.    [provider]  losartan (COZAAR) 25 MG tablet Take 25 mg by mouth daily.    [provider]  medroxyPROGESTERone (DEPO-PROVERA) 150 MG/ML injection Inject 150 mg into the muscle every 3 (three) months.    [provider]  ondansetron (ZOFRAN ODT) 8 MG disintegrating tablet Take 1 tablet (8 mg total) by mouth every 8 (eight) hours as needed for nausea or vomiting. 02/15/17   Idol, Almyra Free, PA-C  tiZANidine (ZANAFLEX) 4 MG tablet Take 1 tablet (4 mg total) by mouth every 8 (eight) hours as needed for muscle spasms. 10/07/19   Melynda Ripple, MD  dicyclomine (BENTYL) 20 MG tablet Take 1 tablet (20 mg total) by mouth every 8 (eight) hours as needed for spasms (abdominal pain). 06/01/16 10/07/19  Jola Schmidt, MD    Allergies    Metronidazole and Shellfish allergy  Review of Systems   Review of Systems All systems reviewed and negative, other than as noted in HPI.  Physical Exam Updated Vital Signs BP 116/75   Pulse 81   Temp 99 F (37.2 C) (Oral)   Ht 5' 7"  (1.702 m)   Wt 76.7 kg   SpO2 94%   BMI 26.47 kg/m   Physical Exam Vitals and nursing note reviewed.  Constitutional:      General: She is not in acute distress.    Appearance: She is well-developed.  HENT:     Head: Normocephalic and atraumatic.  Eyes:     General:        Right eye: No discharge.        Left eye: No discharge.     Conjunctiva/sclera: Conjunctivae normal.  Cardiovascular:     Rate and Rhythm: Normal rate and regular rhythm.     Heart sounds: Normal heart sounds. No murmur. No friction rub. No gallop.   Pulmonary:     Effort: Pulmonary effort is normal. No respiratory distress.     Breath sounds: Normal breath sounds.  Abdominal:     General: There is no distension.     Palpations: Abdomen is soft.     Tenderness: There is abdominal tenderness.     Comments: Mild TTP suprapubically  Musculoskeletal:        General: No tenderness.      Cervical back: Neck supple.  Skin:    General: Skin is warm and dry.  Neurological:     Mental Status: She is alert.  Psychiatric:        Behavior: Behavior normal.  Thought Content: Thought content normal.     ED Results / Procedures / Treatments   Labs (all labs ordered are listed, but only abnormal results are displayed) Labs Reviewed  COMPREHENSIVE METABOLIC PANEL - Abnormal; Notable for the following components:      Result Value   Potassium 3.2 (*)    Glucose, Bld 152 (*)    BUN 5 (*)    Total Protein 8.4 (*)    All other components within normal limits  CBC WITH DIFFERENTIAL/PLATELET - Abnormal; Notable for the following components:   RBC 5.88 (*)    Hemoglobin 15.9 (*)    HCT 48.5 (*)    All other components within normal limits  URINALYSIS, ROUTINE W REFLEX MICROSCOPIC - Abnormal; Notable for the following components:   Color, Urine AMBER (*)    APPearance HAZY (*)    Glucose, UA 50 (*)    Leukocytes,Ua SMALL (*)    Bacteria, UA MANY (*)    All other components within normal limits  GI PATHOGEN PANEL BY PCR, STOOL  C DIFFICILE QUICK SCREEN W PCR REFLEX  URINE CULTURE  SARS CORONAVIRUS 2 (TAT 6-24 HRS)  LACTIC ACID, PLASMA  LACTIC ACID, PLASMA  I-STAT BETA HCG BLOOD, ED ([facility], [facility], [facility] ONLY)    EKG None  Radiology CT ABDOMEN PELVIS W CONTRAST  Result Date: 11/12/2019 CLINICAL DATA:  Diarrhea and nausea EXAM: CT ABDOMEN AND PELVIS WITH CONTRAST TECHNIQUE: Multidetector CT imaging of the abdomen and pelvis was performed using the standard protocol following bolus administration of intravenous contrast. CONTRAST:  159m OMNIPAQUE IOHEXOL 300 MG/ML  SOLN COMPARISON:  11/12/2018 FINDINGS: Lower chest: Patchy left lower lobe atelectasis/consolidation. Hepatobiliary: No focal liver abnormality is seen. Status post cholecystectomy. No biliary dilatation. Pancreas: Unremarkable. Spleen: Unremarkable. Adrenals/Urinary Tract: Adrenal glands are unremarkable. Kidneys  are normal, without renal calculi, focal lesion, or hydronephrosis. Bladder is unremarkable. Stomach/Bowel: Top normal caliber appendix without adjacent inflammatory changes. Scattered colonic diverticula. No evidence of diverticulitis. Stomach is unremarkable. Vascular/Lymphatic: No significant vascular findings are present. No enlarged abdominal or pelvic lymph nodes. Reproductive: Uterus and bilateral adnexa are unremarkable. Other: No free fluid. Musculoskeletal: No acute abnormality. IMPRESSION: No acute abnormality in the abdomen. Patchy left lower lobe atelectasis/consolidation. Electronically Signed   By: PMacy MisM.D.   On: 11/12/2019 14:35   Procedures Procedures (including critical care time)  Medications Ordered in ED Medications  cefTRIAXone (ROCEPHIN) 1 g in sodium chloride 0.9 % 100 mL IVPB (has no administration in time range)  potassium chloride (KLOR-CON) packet 40 mEq (has no administration in time range)  lactated ringers bolus 1,000 mL (1,000 mLs Intravenous New Bag/Given 11/12/19 1244)  HYDROmorphone (DILAUDID) injection 1 mg (1 mg Intravenous Given 11/12/19 1246)  ondansetron (ZOFRAN) injection 4 mg (4 mg Intravenous Given 11/12/19 1246)  iohexol (OMNIPAQUE) 300 MG/ML solution 100 mL (100 mLs Intravenous Contrast Given 11/12/19 1414)   ED Course  I have reviewed the triage vital signs and the nursing notes.  Pertinent labs & imaging results that were available during my care of the patient were reviewed by me and considered in my medical decision making (see chart for details).    MDM Rules/Calculators/A&P  43yF with abdominal pain and n/v/d. Treated symptomatically with much improvement. Has UTI. Probably explains at least some of her symptoms. Abx. Continued symptomatic tx. CT a/p w/o significant acute abnormality. Couldn't provide a stool sample. I think this is a reasonable next step if diarrhea continued. Return precautions discussed. Work note  provided.   KELLY RANIERI was evaluated in Emergency Department on 11/12/2019 for the symptoms described in the history of present illness. She was evaluated in the context of the global COVID-19 pandemic, which necessitated consideration that the patient might be at risk for infection with the SARS-CoV-2 virus that causes COVID-19. Institutional protocols and algorithms that pertain to the evaluation of patients at risk for COVID-19 are in a state of rapid change based on information released by regulatory bodies including the CDC and federal and state organizations. These policies and algorithms were followed during the patient's care in the ED.  Final Clinical Impression(s) / ED Diagnoses Final diagnoses:  Nausea vomiting and diarrhea  Urinary tract infection without hematuria, site unspecified    Rx / DC Orders ED Discharge Orders    None       Virgel Manifold, MD 11/15/19 [phone]

## 2019-11-13 LAB — URINE CULTURE: Culture: <10000 — AB

## 2019-11-13 LAB — SARS CORONAVIRUS 2 (TAT 6-24 HRS): SARS Coronavirus 2: NEGATIVE

## 2019-11-16 DIAGNOSIS — Z0100 Encounter for examination of eyes and vision without abnormal findings: Secondary | ICD-10-CM | POA: Diagnosis not present

## 2019-11-18 MED FILL — ATORVASTATIN 10 MG TABLET: 10 | 90 days supply | Qty: 90 | Fill #0

## 2019-12-02 MED FILL — HUMIRA PEN 40 MG/0.8ML PNKT: 40 | 28 days supply | Qty: 2 | Fill #6

## 2019-12-02 MED FILL — ATORVASTATIN 10 MG TABLET: 10 | 90 days supply | Qty: 90 | Fill #0

## 2019-12-11 DIAGNOSIS — K5010 Crohn's disease of large intestine without complications: Secondary | ICD-10-CM | POA: Diagnosis not present

## 2019-12-11 DIAGNOSIS — R12 Heartburn: Secondary | ICD-10-CM | POA: Diagnosis not present

## 2019-12-11 DIAGNOSIS — R197 Diarrhea, unspecified: Secondary | ICD-10-CM | POA: Diagnosis not present

## 2019-12-11 MED FILL — COLESTIPOL HCL 1 GM TABLET: 1 | 90 days supply | Qty: 180 | Fill #0

## 2019-12-11 MED FILL — FREESTYLE LIBRE 14 DAY SENS: 28 days supply | Qty: 2 | Fill #0

## 2019-12-11 MED FILL — LORazepam 1 MG TABS: 1 | 10 days supply | Qty: 10 | Fill #1

## 2019-12-11 MED FILL — PANTOPRAZOLE SOD DR 40 MG T: 40 | 30 days supply | Qty: 30 | Fill #0

## 2019-12-31 MED FILL — ESCITALOPRAM 5 MG TABLET: 5 | 90 days supply | Qty: 180 | Fill #1

## 2020-01-02 ENCOUNTER — Other Ambulatory Visit: Payer: Self-pay | Admitting: Pharmacist

## 2020-01-02 MED ORDER — HUMIRA PEN 40 MG/0.8ML ~~LOC~~ PNKT: 40.0000 mg | PEN_INJECTOR | SUBCUTANEOUS | 5 refills | Status: DC

## 2020-01-02 MED FILL — HUMIRA PEN 40 MG/0.8ML PNKT: 40 | 28 days supply | Qty: 2 | Fill #0

## 2020-01-19 MED FILL — FREESTYLE LIBRE 14 DAY SENS: 28 days supply | Qty: 2 | Fill #1

## 2020-01-20 MED FILL — METFORMIN HCL 500 MG TABS: 500 | 90 days supply | Qty: 90 | Fill #2

## 2020-01-20 MED FILL — SPIRONOLACTONE 50 MG TABLET: 50 | 30 days supply | Qty: 30 | Fill #4

## 2020-01-20 MED FILL — LOSARTAN POTASSIUM 50 MG TA: 50 | 30 days supply | Qty: 30 | Fill #4

## 2020-01-20 MED FILL — GABAPENTIN 300 MG CAPSULE: 300 | 90 days supply | Qty: 90 | Fill #2

## 2020-01-22 MED FILL — HUMULIN R 500 UNITS/ML KWIK: 500 | 90 days supply | Qty: 36 | Fill #1

## 2020-01-24 DIAGNOSIS — Z3042 Encounter for surveillance of injectable contraceptive: Secondary | ICD-10-CM | POA: Diagnosis not present

## 2020-01-24 MED FILL — medroxyPROGESTERone ACETATE: 150 | 84 days supply | Qty: 1 | Fill #3

## 2020-01-29 MED FILL — HUMIRA PEN 40 MG/0.8ML PNKT: 40 | 28 days supply | Qty: 2 | Fill #1

## 2020-02-20 MED FILL — FREESTYLE LIBRE 14 DAY SENS: 28 days supply | Qty: 2 | Fill #2

## 2020-03-02 MED FILL — HUMIRA PEN 40 MG/0.8ML PNKT: 40 | 28 days supply | Qty: 2 | Fill #2

## 2020-03-03 DIAGNOSIS — I10 Essential (primary) hypertension: Secondary | ICD-10-CM | POA: Diagnosis not present

## 2020-03-03 DIAGNOSIS — J4521 Mild intermittent asthma with (acute) exacerbation: Secondary | ICD-10-CM | POA: Diagnosis not present

## 2020-03-03 DIAGNOSIS — K5010 Crohn's disease of large intestine without complications: Secondary | ICD-10-CM | POA: Diagnosis not present

## 2020-03-03 DIAGNOSIS — E1165 Type 2 diabetes mellitus with hyperglycemia: Secondary | ICD-10-CM | POA: Diagnosis not present

## 2020-03-03 DIAGNOSIS — Z0000 Encounter for general adult medical examination without abnormal findings: Secondary | ICD-10-CM | POA: Diagnosis not present

## 2020-03-03 DIAGNOSIS — Z13220 Encounter for screening for lipoid disorders: Secondary | ICD-10-CM | POA: Diagnosis not present

## 2020-03-03 DIAGNOSIS — L989 Disorder of the skin and subcutaneous tissue, unspecified: Secondary | ICD-10-CM | POA: Diagnosis not present

## 2020-03-10 ENCOUNTER — Ambulatory Visit (INDEPENDENT_AMBULATORY_CARE_PROVIDER_SITE_OTHER): Payer: 59 | Admitting: Family Medicine

## 2020-03-10 ENCOUNTER — Ambulatory Visit: Payer: Self-pay

## 2020-03-10 ENCOUNTER — Encounter: Payer: Self-pay | Admitting: Family Medicine

## 2020-03-10 ENCOUNTER — Other Ambulatory Visit: Payer: Self-pay

## 2020-03-10 VITALS — BP 136/84 | HR 94 | Ht 67.0 in | Wt 181.0 lb

## 2020-03-10 DIAGNOSIS — M25561 Pain in right knee: Secondary | ICD-10-CM

## 2020-03-10 MED FILL — LORazepam 1 MG TABS: 1 | 40 days supply | Qty: 20 | Fill #0

## 2020-03-10 NOTE — Progress Notes (Signed)
    Subjective:    CC: R knee pain  I, Molly Weber, LAT, ATC, am serving as scribe for Dr. Lynne Leader.  HPI: Pt is a 44 y/o female presenting w/ c/o R knee pain x few months after falling down a step in her garage and hit her R knee on concrete.  She locates the pain to her R anterior knee.  She rates her pain as moderate and describes her pain as aching and throbbing.  Pain is worse going up and down stairs.  Occasionally it is painful when she gets her knee on something or when she has the needle.  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  Radiating pain: No R knee swelling: Yes, some mild swelling R knee mechanical symptoms: Yes Aggravating factors: steps; walking Treatments tried: IBU, Tylenol; BioFreeze  Pertinent review of Systems: No fevers or chills  Relevant historical information: History IBS   Objective:    Vitals:   03/10/20 1452  BP: 136/84  Pulse: 94  SpO2: 97%   General: Well Developed, well nourished, and in no acute distress.   MSK: Right knee normal-appearing without significant swelling or erythema. Tender palpation overlying proximal patellar tendon with palpable squeak.  Nontender otherwise. Range of motion 0-120 degrees with mild crepitation. Stable ligamentous exam. Intact strength.  Lab and Radiology Results  Diagnostic Limited MSK Ultrasound of: Right knee Quad tendon normal-appearing intact in. Small joint effusion present superior patellar space. Patellar tendon intact normal-appearing Small amount of hypoechoic fluid in tracing along subcutaneous tissue superficial to patellar tendon. Medial lateral joint line normal-appearing No Baker's cyst present. Impression: Prepatellar bursitis    Impression and Recommendations:    Assessment and Plan: 44 y.o. female with right knee pain following trauma consistent with mild prepatellar bursitis.  She may also have a component of patellofemoral pain syndrome or  chondromalacia patella.  Plan for home exercise program as taught in clinic today by ATC as well as trial of Voltaren gel and compressive knee sleeve.  If not improving return to clinic may proceed with further treatment including injection.[name]  PDMP not reviewed this encounter. Orders Placed This Encounter  Procedures  . [place] LIMITED JOINT SPACE STRUCTURES LOW RIGHT(NO LINKED CHARGES)    Order Specific Question:   Reason for Exam (SYMPTOM  OR DIAGNOSIS REQUIRED)    Answer:   R knee pain    Order Specific Question:   Preferred imaging location?    Answer:   [place]   No orders of the defined types were placed in this encounter.   Discussed warning signs or symptoms. Please see discharge instructions. Patient expresses understanding.   The above documentation has been reviewed and is accurate and complete Lynne Leader

## 2020-03-10 NOTE — Patient Instructions (Addendum)
Thank you for coming in today. Use voltaren gel over the counter up to 4 x daily.  Use body helix full knee sleeve as tolerated.  Do the exercises \ Keep me updated.    I recommend you obtained a compression sleeve to help with your joint problems. There are many options on the market however I recommend obtaining a knee Body Helix compression sleeve.  You can find information (including how to appropriate measure yourself for sizing) can be found at www.Body http://www.lambert.com/.  Many of these products are health savings account (HSA) eligible.   You can use the compression sleeve at any time throughout the day but is most important to use while being active as well as for 2 hours post-activity.   It is appropriate to ice following activity with the compression sleeve in place.  Please perform the exercise program that we have prepared for you and gone over in detail on a daily basis.  In addition to the handout you were provided you can access your program through: [email or web address]-exercise-[email or web address]   Your unique program code is:  [email or web address]

## 2020-03-23 MED FILL — LORazepam 1 MG TABS: 1 | 40 days supply | Qty: 20 | Fill #0

## 2020-03-23 MED FILL — FREESTYLE LIBRE 14 DAY SENS: 28 days supply | Qty: 2 | Fill #3

## 2020-03-24 ENCOUNTER — Ambulatory Visit: Payer: 59 | Admitting: Sports Medicine

## 2020-03-24 ENCOUNTER — Encounter: Payer: Self-pay | Admitting: Sports Medicine

## 2020-03-24 ENCOUNTER — Other Ambulatory Visit: Payer: Self-pay

## 2020-03-24 ENCOUNTER — Ambulatory Visit (INDEPENDENT_AMBULATORY_CARE_PROVIDER_SITE_OTHER): Payer: 59

## 2020-03-24 VITALS — BP 121/78 | HR 106 | Temp 97.6°F | Resp 16

## 2020-03-24 DIAGNOSIS — M79671 Pain in right foot: Secondary | ICD-10-CM

## 2020-03-24 DIAGNOSIS — M722 Plantar fascial fibromatosis: Secondary | ICD-10-CM | POA: Diagnosis not present

## 2020-03-24 DIAGNOSIS — E119 Type 2 diabetes mellitus without complications: Secondary | ICD-10-CM | POA: Diagnosis not present

## 2020-03-24 MED ORDER — TRIAMCINOLONE ACETONIDE 10 MG/ML IJ SUSP
10.0000 mg | Freq: Once | INTRAMUSCULAR | Status: AC
  Administered 2020-03-24: 10 mg

## 2020-03-24 NOTE — Patient Instructions (Signed)

## 2020-03-24 NOTE — Progress Notes (Signed)
Subjective: Cindy Long is a 44 y.o. female patient presents to office with complaint of moderate heel pain on the right that extends to arch worse with 1st few steps or when she has done a lot of walking. Patient admits to post static dyskinesia for 1 year in duration. Patient has treated this problem with changing shoes with no relief. Denies any other pedal complaints.   Patient is also diabetic with last blood sugar not recorded but last A1c 10 per patient.  Works at [employer] other systems reviewed and are negative.    Patient Active Problem List   Diagnosis Date Noted  . Postpartum care following cesarean delivery (2/10) 12/17/2013  . Indication for care in labor or delivery 11/30/2013  . Preexisting diabetes complicating pregnancy in second trimester, antepartum 08/18/2013  . IBS (irritable bowel syndrome) 02/05/2013  . Other and unspecified hyperlipidemia 02/05/2013  . Abnormal uterine bleeding 06/18/2012  . Pelvic pain 06/18/2012  . Type II diabetes mellitus ([place]) 06/18/2012    Current Outpatient Medications on File Prior to Visit  Medication Sig Dispense Refill  . Adalimumab (HUMIRA PEN) 40 MG/0.8ML PNKT Inject 40 mg into the skin every 14 (fourteen) days. 2 each 5  . albuterol (PROVENTIL HFA;VENTOLIN HFA) 108 (90 Base) MCG/ACT inhaler Inhale 1-2 puffs into the lungs every 6 (six) hours as needed for wheezing or shortness of breath.    [name] atorvastatin (LIPITOR) 10 MG tablet Take 10 mg by mouth daily.    . Continuous Blood Gluc Sensor (FREESTYLE LIBRE 14 DAY SENSOR) MISC APPLY TO THE BACK OF THE UPPER ARM EVERY 14 DAYS    . escitalopram (LEXAPRO) 5 MG tablet Take 1 tablet by mouth 3 (three) times daily.    [name] gabapentin (NEURONTIN) 300 MG capsule Take 1 capsule by mouth 3 (three) times daily.    [name] HUMULIN R U-500 KWIKPEN 500 UNIT/ML kwikpen     . hyoscyamine (LEVSIN, ANASPAZ) 0.125 MG tablet Take 1-2 tablets (0.125-0.25 mg total) by  mouth every 4 (four) hours as needed. 30 tablet 0  . ibuprofen (ADVIL) 600 MG tablet Take 1 tablet (600 mg total) by mouth every 6 (six) hours as needed. 30 tablet 0  . Insulin Pen Needle 32G X 4 MM MISC by Does not apply route. BD U/F Pen Needles Nano 4 mm x 32 G; use with insulin    . LORazepam (ATIVAN) 1 MG tablet Take 0.5 mg by mouth daily as needed.    [name] losartan (COZAAR) 50 MG tablet Take 50 mg by mouth daily.    . medroxyPROGESTERone (DEPO-PROVERA) 150 MG/ML injection Inject 150 mg into the muscle every 3 (three) months.    . metFORMIN (GLUCOPHAGE) 500 MG tablet Take 1 tablet by mouth daily.    . promethazine (PHENERGAN) 25 MG tablet Take 25 mg by mouth every 6 (six) hours as needed for nausea or vomiting.    [name] spironolactone (ALDACTONE) 50 MG tablet Take 50 mg by mouth daily.    [name] tiZANidine (ZANAFLEX) 4 MG tablet Take 1 tablet (4 mg total) by mouth every 8 (eight) hours as needed for muscle spasms. 30 tablet 0  . [DISCONTINUED] dicyclomine (BENTYL) 20 MG tablet Take 1 tablet (20 mg total) by mouth every 8 (eight) hours as needed for spasms (abdominal pain). 20 tablet 0   No current facility-administered medications on file prior to visit.    Allergies  Allergen Reactions  . Metronidazole Swelling    Throat  swelling  . Shellfish Allergy Swelling    Swelling of the throat    Objective: Physical Exam General: The patient is alert and oriented x3 in no acute distress.  Dermatology: Skin is warm, dry and supple bilateral lower extremities. Nails 1-10 are normal. There is no erythema, edema, no eccymosis, no open lesions present. Integument is otherwise unremarkable.  Vascular: Dorsalis Pedis pulse and Posterior Tibial pulse are 2/4 bilateral. Capillary fill time is immediate to all digits.  Neurological: Grossly intact to light touch with an achilles reflex of +2/5 and a  negative Tinel's sign bilateral. Vibratory and epicritic sensation intact bilateral.  Musculoskeletal:  Tenderness to palpation at the medial calcaneal tubercale and through the insertion of the plantar fascia on the Right foot. No pain with compression of calcaneus bilateral. No pain with tuning fork to calcaneus bilateral. No pain with calf compression bilateral. There is decreased Ankle joint range of motion bilateral, + Pes planus bilateral. All other joints range of motion within normal limits bilateral. Strength 5/5 in all groups bilateral.   Gait: Unassisted, Antalgic avoid weight on right heel  Xray, Right foot:  Normal osseous mineralization. Joint spaces preserved. No fracture/dislocation/boney destruction. Calcaneal spur present with mild thickening of plantar fascia. No other soft tissue abnormalities or radiopaque foreign bodies.   Assessment and Plan: Problem List Items Addressed This Visit    None    Visit Diagnoses    Right foot pain    -  Primary   Relevant Orders   DG Foot Complete Right   Plantar fasciitis of right foot       Relevant Medications   triamcinolone acetonide (KENALOG) 10 MG/ML injection 10 mg (Start on 03/24/2020 10:45 PM)   Diabetes mellitus without complication (HCC)       Relevant Medications   atorvastatin (LIPITOR) 10 MG tablet   HUMULIN R U-500 KWIKPEN 500 UNIT/ML kwikpen     -Complete examination performed.  -Xrays reviewed -Discussed with patient in detail the condition of plantar fasciitis, how this occurs and general treatment options. Explained both conservative and surgical treatments.  -After oral consent and aseptic prep, injected a mixture containing 1 ml of 2%  plain lidocaine, 1 ml 0.5% plain marcaine, 0.5 ml of kenalog 10 and 0.5 ml of dexamethasone phosphate into right heel. Post-injection care discussed with patient.  -Recommended good supportive shoes and advised use of heel lifts dispensed  -Explained and dispensed to patient daily stretching exercises. -Recommend patient to ice affected area 1-2x daily. -Patient to return to office  in 3-4 weeks for follow up or sooner if problems or questions arise.  Landis Martins, DPM

## 2020-03-26 ENCOUNTER — Other Ambulatory Visit: Payer: Self-pay | Admitting: Sports Medicine

## 2020-03-26 DIAGNOSIS — M722 Plantar fascial fibromatosis: Secondary | ICD-10-CM

## 2020-03-26 MED FILL — HUMIRA PEN 40 MG/0.8ML PNKT: 40 | 28 days supply | Qty: 2 | Fill #3

## 2020-04-01 DIAGNOSIS — R61 Generalized hyperhidrosis: Secondary | ICD-10-CM | POA: Diagnosis not present

## 2020-04-02 ENCOUNTER — Ambulatory Visit: Payer: 59 | Admitting: Podiatry

## 2020-04-07 ENCOUNTER — Other Ambulatory Visit (HOSPITAL_COMMUNITY): Payer: Self-pay | Admitting: Internal Medicine

## 2020-04-07 DIAGNOSIS — E1165 Type 2 diabetes mellitus with hyperglycemia: Secondary | ICD-10-CM | POA: Diagnosis not present

## 2020-04-07 DIAGNOSIS — Z794 Long term (current) use of insulin: Secondary | ICD-10-CM | POA: Diagnosis not present

## 2020-04-07 DIAGNOSIS — I10 Essential (primary) hypertension: Secondary | ICD-10-CM | POA: Diagnosis not present

## 2020-04-07 DIAGNOSIS — R197 Diarrhea, unspecified: Secondary | ICD-10-CM | POA: Diagnosis not present

## 2020-04-07 MED FILL — FARXIGA 5 MG TABLET: 5 | 90 days supply | Qty: 90 | Fill #0

## 2020-04-09 MED FILL — MEDROXYPROGESTERONE ACETATE: 150 | 84 days supply | Qty: 1 | Fill #4

## 2020-04-10 DIAGNOSIS — Z3042 Encounter for surveillance of injectable contraceptive: Secondary | ICD-10-CM | POA: Diagnosis not present

## 2020-04-21 ENCOUNTER — Ambulatory Visit: Payer: 59 | Admitting: Sports Medicine

## 2020-04-23 MED FILL — HUMIRA PEN 40 MG/0.8ML PNKT: 40 | 28 days supply | Qty: 2 | Fill #4

## 2020-04-24 ENCOUNTER — Other Ambulatory Visit (HOSPITAL_COMMUNITY): Payer: Self-pay | Admitting: Internal Medicine

## 2020-04-24 MED FILL — LOSARTAN POTASSIUM 50 MG TA: 50 | 90 days supply | Qty: 90 | Fill #0

## 2020-04-27 MED FILL — FREESTYLE LIBRE 2 SENSOR SY: 28 days supply | Qty: 2 | Fill #0

## 2020-04-28 ENCOUNTER — Other Ambulatory Visit (HOSPITAL_COMMUNITY): Payer: Self-pay | Admitting: Internal Medicine

## 2020-05-01 ENCOUNTER — Ambulatory Visit
Admission: EM | Admit: 2020-05-01 | Discharge: 2020-05-01 | Disposition: A | Discharge status: Home / Self Care | Payer: 59

## 2020-05-01 ENCOUNTER — Other Ambulatory Visit: Payer: Self-pay

## 2020-05-01 DIAGNOSIS — F411 Generalized anxiety disorder: Secondary | ICD-10-CM

## 2020-05-01 DIAGNOSIS — R079 Chest pain, unspecified: Secondary | ICD-10-CM | POA: Diagnosis not present

## 2020-05-01 MED ORDER — HYDROXYZINE HCL 25 MG PO TABS
25.0000 mg | ORAL_TABLET | Freq: Three times a day (TID) | ORAL | 0 refills | Status: DC | PRN
Start: 2020-05-01 — End: 2021-11-18

## 2020-05-01 NOTE — ED Triage Notes (Signed)
Pt reports chrons flare with increased episodes of diarrhea this week, pt had been in touch with gastro

## 2020-05-01 NOTE — ED Provider Notes (Signed)
[place]   [id number] 05/01/20 Arrival Time: 6226   CC: CHEST PAIN  SUBJECTIVE:  Cindy Long is a 44 y.o. female hx significant for crohns (currently on humira), DM, GERD, HLD, HTN, who presents with complaint of chest pain x couple of days.  Denies a precipitating event, trauma, recent lower respiratory tract, or strenuous upper body activities.  Does admit to recent stressors with starting new job, managing DM, and finding out sister has a brain mass.  Also admits to associated low blood sugar this past week (50's), abdominal cramping, and diarrhea (may be secondary to crohn's).  Localizes chest pain to the substernal region.  Describes as improved (but worse last night), that is intermittent (with episodes lasting a few seconds) and as if an "elephant is sitting on (her) chest" in character.  Rates pain as 5/10.   Denies alleviating factors.  Symptoms made worse with stress.  Denies radiating symptoms.  Denies previous symptoms in the past. Complains of dizziness "off balance" this morning and again at work today, palpitations, elevated heart rate today at 118, nausea, watery diarrhea, and anxiety.  Denies fever, chills, lightheadedness, SOB, vomiting, changes in bladder habits, diaphoresis, numbness/tingling in extremities, peripheral edema.    Denies SOB, calf pain or swelling, recent long travel, recent surgery, pregnancy, malignancy, tobacco use, hormone use, or previous blood clot  Denies close relatives with cardiac hx.  Mother with HTN, and maternal GM and GF  Previous cardiac testing: EKG and stress test a few years ago because patient was having tachycardia at work.  Cardiologist reported symptoms most likely related to DM.    ROS: As per HPI.  All other pertinent ROS negative.    Past Medical History:  Diagnosis Date  . Crohn disease ([place])   . Diabetes mellitus    IDDM  . GERD (gastroesophageal reflux disease)    no meds currently  . High cholesterol   .  Hypertension   . Mastitis    right breast  . Postpartum care following cesarean delivery (2/10) 12/17/2013   Past Surgical History:  Procedure Laterality Date  . CERVICAL CERCLAGE    . CERVICAL CERCLAGE N/A 06/21/2013   Procedure: Linna Caprice CERVICAL;  Surgeon: Marvene Staff, MD;  Location: [facility];  Service: Gynecology;  Laterality: N/A;  . CESAREAN SECTION    . CESAREAN SECTION N/A 12/17/2013   Procedure: Repeat CESAREAN SECTION with Cerclage Removal;  Surgeon: Marvene Staff, MD;  Location: [facility];  Service: Obstetrics;  Laterality: N/A;  EDD: 12/22/13  . CHOLECYSTECTOMY    . CHOLECYSTECTOMY OPEN  08/2011  . LAPAROSCOPIC ENDOMETRIOSIS FULGURATION  01/2011   Allergies  Allergen Reactions  . Metronidazole Swelling    Throat swelling  . Shellfish Allergy Swelling    Swelling of the throat   No current facility-administered medications on file prior to encounter.   Current Outpatient Medications on File Prior to Encounter  Medication Sig Dispense Refill  . Adalimumab (HUMIRA PEN) 40 MG/0.8ML PNKT Inject 40 mg into the skin every 14 (fourteen) days. 2 each 5  . albuterol (PROVENTIL HFA;VENTOLIN HFA) 108 (90 Base) MCG/ACT inhaler Inhale 1-2 puffs into the lungs every 6 (six) hours as needed for wheezing or shortness of breath.    [name] atorvastatin (LIPITOR) 10 MG tablet Take 10 mg by mouth daily.    . Continuous Blood Gluc Sensor (FREESTYLE LIBRE 14 DAY SENSOR) MISC APPLY TO THE BACK OF THE UPPER ARM EVERY 14 DAYS    .  escitalopram (LEXAPRO) 5 MG tablet Take 1 tablet by mouth 3 (three) times daily.    [name] FARXIGA 5 MG TABS tablet Take 5 mg by mouth daily.    [name] gabapentin (NEURONTIN) 300 MG capsule Take 1 capsule by mouth 3 (three) times daily.    [name] HUMULIN R U-500 KWIKPEN 500 UNIT/ML kwikpen     . hyoscyamine (LEVSIN, ANASPAZ) 0.125 MG tablet Take 1-2 tablets (0.125-0.25 mg total) by mouth every 4 (four) hours as needed. 30 tablet 0  . ibuprofen (ADVIL) 600 MG tablet Take 1  tablet (600 mg total) by mouth every 6 (six) hours as needed. 30 tablet 0  . Insulin Pen Needle 32G X 4 MM MISC by Does not apply route. BD U/F Pen Needles Nano 4 mm x 32 G; use with insulin    . LORazepam (ATIVAN) 1 MG tablet Take 0.5 mg by mouth daily as needed.    [name] losartan (COZAAR) 50 MG tablet Take 50 mg by mouth daily.    . medroxyPROGESTERone (DEPO-PROVERA) 150 MG/ML injection Inject 150 mg into the muscle every 3 (three) months.    . promethazine (PHENERGAN) 25 MG tablet Take 25 mg by mouth every 6 (six) hours as needed for nausea or vomiting.    [name] spironolactone (ALDACTONE) 50 MG tablet Take 50 mg by mouth daily.    . [DISCONTINUED] dicyclomine (BENTYL) 20 MG tablet Take 1 tablet (20 mg total) by mouth every 8 (eight) hours as needed for spasms (abdominal pain). 20 tablet 0   Social History   Socioeconomic History  . Marital status: Married    Spouse name: Not on file  . Number of children: Not on file  . Years of education: Not on file  . Highest education level: Not on file  Occupational History  . Not on file  Tobacco Use  . Smoking status: Never Smoker  . Smokeless tobacco: Never Used  Substance and Sexual Activity  . Alcohol use: Yes    Comment: occasionally  . Drug use: No  . Sexual activity: Not on file  Other Topics Concern  . Not on file  Social History Narrative  . Not on file   Social Determinants of Health   Financial Resource Strain:   . Difficulty of Paying Living Expenses:   Food Insecurity:   . Worried About Charity fundraiser in the Last Year:   . Arboriculturist in the Last Year:   Transportation Needs:   . Film/video editor (Medical):   [name] Lack of Transportation (Non-Medical):   Physical Activity:   . Days of Exercise per Week:   . Minutes of Exercise per Session:   Stress:   . Feeling of Stress :   Social Connections:   . Frequency of Communication with Friends and Family:   . Frequency of Social Gatherings with Friends and Family:     . Attends Religious Services:   . Active Member of Clubs or Organizations:   . Attends Archivist Meetings:   [name] Marital Status:   Intimate Partner Violence:   . Fear of Current or Ex-Partner:   . Emotionally Abused:   [name] Physically Abused:   . Sexually Abused:    Family History  Problem Relation Age of Onset  . Diabetes Maternal Aunt   . Cancer Maternal Grandmother        breast, colon  . Diabetes Maternal Grandmother      OBJECTIVE:  Vitals:   05/01/20 1808  BP: (!) 186/96  Pulse: 91  Resp: 18  Temp: 98.4 F (36.9 C)  SpO2: 97%    General appearance: alert; no distress Eyes: PERRLA; EOMI; conjunctiva normal HENT: normocephalic; atraumatic Neck: supple; no carotid bruits Lungs: clear to auscultation bilaterally without adventitious breath sounds Heart: regular rate and rhythm.  Clear S1 and S2 without rubs, gallops, or murmur. Abdomen: soft, non-tender; bowel sounds normal; no guarding Extremities: no cyanosis or edema; symmetrical with no gross deformities Skin: warm and dry Psychological: alert and cooperative; mildly depressed mood and affect, became tearful during exam  ECG: Orders placed or performed during the hospital encounter of 05/01/20  . ED EKG  . ED EKG   EKG normal sinus rhythm without ST elevations, depressions, or prolonged PR interval.  No narrowing or widening of the QRS complexes.  Comparable to past EKG   ASSESSMENT & PLAN:  1. Chest pain, unspecified type   2. Anxiety state     Meds ordered this encounter  Medications  . hydrOXYzine (ATARAX/VISTARIL) 25 MG tablet    Sig: Take 1 tablet (25 mg total) by mouth every 8 (eight) hours as needed.    Dispense:  30 tablet    Refill:  0    Order Specific Question:   Supervising Provider    Answer:   Raylene Everts [[id number]]   Unable to rule out cardiac disease or blood clot in urgent care setting.  Offered patient further evaluation and management in the ED.  Patient declines  at this time and would like to try outpatient therapy first.  Aware of the risk associated with this decision including missed diagnosis, organ damage, organ failure, and/or death.  Patient aware and in agreement.     Rest and drink fluids Eat a well-balanced diet, and avoid excessive caffeine intake Hydroxyzine prescribed.  Use as directed.  Avoid driving or operating heavy machinery as this medication may make you drowsy Some things you may try doing to help alleviate your symptoms include: keeping a journal, exercise, talking to a friend or relative, listening to music, going for a walk or hike outside, or other activities that you may find enjoyable Follow up with PCP Return or go to ER if you have any new or worsening symptoms such as fever, chills, fatigue, worsening shortness of breath, wheezing, chest pain, nausea, vomiting, abdominal pain, changes in bowel or bladder habits, etc...   Chest pain precautions given. Reviewed expectations re: course of current medical issues. Questions answered. Outlined signs and symptoms indicating need for more acute intervention. Patient verbalized understanding. After Visit Summary given.   Lestine Box, PA-C 05/01/20 1839

## 2020-05-01 NOTE — Discharge Instructions (Signed)
Unable to rule out cardiac disease or blood clot in urgent care setting.  Offered patient further evaluation and management in the ED.  Patient declines at this time and would like to try outpatient therapy first.  Aware of the risk associated with this decision including missed diagnosis, organ damage, organ failure, and/or death.  Patient aware and in agreement.     Rest and drink fluids Eat a well-balanced diet, and avoid excessive caffeine intake Hydroxyzine prescribed.  Use as directed.  Avoid driving or operating heavy machinery as this medication may make you drowsy Some things you may try doing to help alleviate your symptoms include: keeping a journal, exercise, talking to a friend or relative, listening to music, going for a walk or hike outside, or other activities that you may find enjoyable Follow up with PCP Return or go to ER if you have any new or worsening symptoms such as fever, chills, fatigue, worsening shortness of breath, wheezing, chest pain, nausea, vomiting, abdominal pain, changes in bowel or bladder habits, etc..[name]

## 2020-05-04 DIAGNOSIS — L309 Dermatitis, unspecified: Secondary | ICD-10-CM | POA: Diagnosis not present

## 2020-05-04 DIAGNOSIS — L810 Postinflammatory hyperpigmentation: Secondary | ICD-10-CM | POA: Diagnosis not present

## 2020-05-04 MED FILL — CLOBETASOL PROPIONATE 0.05: 0.05 | 14 days supply | Qty: 30 | Fill #0

## 2020-05-15 DIAGNOSIS — K5010 Crohn's disease of large intestine without complications: Secondary | ICD-10-CM | POA: Diagnosis not present

## 2020-05-19 ENCOUNTER — Other Ambulatory Visit (HOSPITAL_COMMUNITY): Payer: Self-pay | Admitting: Internal Medicine

## 2020-05-19 DIAGNOSIS — Z1231 Encounter for screening mammogram for malignant neoplasm of breast: Secondary | ICD-10-CM | POA: Diagnosis not present

## 2020-05-19 DIAGNOSIS — Z6827 Body mass index (BMI) 27.0-27.9, adult: Secondary | ICD-10-CM | POA: Diagnosis not present

## 2020-05-19 DIAGNOSIS — K5010 Crohn's disease of large intestine without complications: Secondary | ICD-10-CM | POA: Diagnosis not present

## 2020-05-19 DIAGNOSIS — F418 Other specified anxiety disorders: Secondary | ICD-10-CM | POA: Diagnosis not present

## 2020-05-19 DIAGNOSIS — Z1151 Encounter for screening for human papillomavirus (HPV): Secondary | ICD-10-CM | POA: Diagnosis not present

## 2020-05-19 DIAGNOSIS — R519 Headache, unspecified: Secondary | ICD-10-CM | POA: Diagnosis not present

## 2020-05-19 DIAGNOSIS — R000 Tachycardia, unspecified: Secondary | ICD-10-CM | POA: Diagnosis not present

## 2020-05-19 DIAGNOSIS — Z01419 Encounter for gynecological examination (general) (routine) without abnormal findings: Secondary | ICD-10-CM | POA: Diagnosis not present

## 2020-05-19 MED FILL — ESCITALOPRAM 20 MG TABLET: 20 | 90 days supply | Qty: 180 | Fill #0

## 2020-05-19 MED FILL — MELOXICAM 15 MG TABLET: 15 | 30 days supply | Qty: 30 | Fill #0

## 2020-05-21 DIAGNOSIS — R519 Headache, unspecified: Secondary | ICD-10-CM | POA: Diagnosis not present

## 2020-05-21 MED FILL — HUMIRA PEN 40 MG/0.8ML PNKT: 40 | 28 days supply | Qty: 2 | Fill #5

## 2020-05-21 MED FILL — traMADol HCL 50 MG TABS: 50 | 7 days supply | Qty: 7 | Fill #0

## 2020-05-26 ENCOUNTER — Ambulatory Visit: Payer: 59 | Admitting: Sports Medicine

## 2020-06-10 MED FILL — FREESTYLE LIBRE 2 SENSOR MI: 28 days supply | Qty: 2 | Fill #1

## 2020-06-10 MED FILL — LORazepam 1 MG TABS: 1 | 40 days supply | Qty: 20 | Fill #1

## 2020-06-10 MED FILL — FARXIGA 10 MG TABLET: 10 | 30 days supply | Qty: 30 | Fill #0

## 2020-06-16 ENCOUNTER — Telehealth: Payer: Self-pay | Admitting: Pharmacist

## 2020-06-16 NOTE — Telephone Encounter (Signed)
Called patient to schedule an appointment for the [facility]. I was unable to reach the patient so I left a HIPAA-compliant message requesting that the patient return my call.

## 2020-06-19 ENCOUNTER — Other Ambulatory Visit (HOSPITAL_COMMUNITY): Payer: Self-pay | Admitting: Specialist

## 2020-06-19 DIAGNOSIS — G4485 Primary stabbing headache: Secondary | ICD-10-CM | POA: Diagnosis not present

## 2020-06-19 DIAGNOSIS — Z049 Encounter for examination and observation for unspecified reason: Secondary | ICD-10-CM | POA: Diagnosis not present

## 2020-06-19 DIAGNOSIS — Z79899 Other long term (current) drug therapy: Secondary | ICD-10-CM | POA: Diagnosis not present

## 2020-06-19 MED FILL — ZONISAMIDE 25 MG CAPSULE: 25 | 30 days supply | Qty: 60 | Fill #0

## 2020-06-22 ENCOUNTER — Other Ambulatory Visit (HOSPITAL_COMMUNITY): Payer: Self-pay | Admitting: Internal Medicine

## 2020-06-22 ENCOUNTER — Ambulatory Visit (HOSPITAL_BASED_OUTPATIENT_CLINIC_OR_DEPARTMENT_OTHER): Payer: 59 | Admitting: Pharmacist

## 2020-06-22 ENCOUNTER — Other Ambulatory Visit: Payer: Self-pay

## 2020-06-22 DIAGNOSIS — Z79899 Other long term (current) drug therapy: Secondary | ICD-10-CM

## 2020-06-22 MED ORDER — HUMIRA PEN 40 MG/0.8ML ~~LOC~~ PNKT: 40.0000 mg | PEN_INJECTOR | SUBCUTANEOUS | 5 refills | Status: DC

## 2020-06-22 MED FILL — HUMIRA PEN 40 MG/0.8ML PNKT: 40 | 28 days supply | Qty: 2 | Fill #0

## 2020-06-22 NOTE — Progress Notes (Signed)
  S: Patient presents for review of their specialty medication therapy.  Patient is currently taking Humira for Crohns disease. Patient is managed by Dr. Therisa Doyne for this.   Adherence: denies any missed doses  Efficacy: reports that it works well for her, she has been on it for a while now.  Dosing:  Crohn disease:  - SubQ - Maintenance: 40 mg every other week .  Screening: TB test: completed per pt Hepatitis: completed per pt  Monitoring: S/sx of infection: none  CBC: monitored by [facility] S/sx of hypersensitivity: none  S/sx of malignancy: none  S/sx of heart failure: none   Other side effects: none   O:     Lab Results  Component Value Date   WBC 5.9 11/12/2019   HGB 15.9 (H) 11/12/2019   HCT 48.5 (H) 11/12/2019   MCV 82.5 11/12/2019   PLT 224 11/12/2019      Chemistry      Component Value Date/Time   NA 137 11/12/2019 1235   K 3.2 (L) 11/12/2019 1235   CL 104 11/12/2019 1235   CO2 22 11/12/2019 1235   BUN 5 (L) 11/12/2019 1235   CREATININE 0.54 11/12/2019 1235      Component Value Date/Time   CALCIUM 9.4 11/12/2019 1235   ALKPHOS 65 11/12/2019 1235   AST 21 11/12/2019 1235   ALT 25 11/12/2019 1235   BILITOT 0.7 11/12/2019 1235       A/P: 1. Medication review: Patient currently on Humira for Crohn's disease and is tolerating it well with no adverse effects. Reviewed the medication with the patient, including the following: Humira is a TNF blocking agent indicated for ankylosing spondylitis, Crohn's disease, Hidradenitis suppurativa, psoriatic arthritis, plaque psoriasis, ulcerative colitis, and uveitis. Patient educated on purpose, proper use and potential adverse effects of Humira. Possible adverse effects are increased risk of infections, headache, and injection site reactions. There is the possibility of an increased risk of malignancy but it is not well understood if this increased risk is due to there medication or the disease state. There are rare  cases of pancytopenia and aplastic anemia. For SubQ injection at separate sites in the thigh or lower abdomen (avoiding areas within 2 inches of navel); rotate injection sites. May leave at room temperature for ~15 to 30 minutes prior to use; do not remove cap or cover while allowing product to reach room temperature. Do not use if solution is discolored or contains particulate matter. Do not administer to skin which is red, tender, bruised, hard, or that has scars, stretch marks, or psoriasis plaques. Needle cap of the prefilled syringe or needle cover for the adalimumab pen may contain latex. Prefilled pens and syringes are available for use by patients and the full amount of the syringe should be injected (self-administration); the vial is intended for institutional use only. Vials do not contain a preservative; discard unused portion. No recommendations for any changes at this time.  Benard Halsted, PharmD, [place] [phone]

## 2020-06-25 DIAGNOSIS — Z23 Encounter for immunization: Secondary | ICD-10-CM | POA: Diagnosis not present

## 2020-06-29 DIAGNOSIS — Z3042 Encounter for surveillance of injectable contraceptive: Secondary | ICD-10-CM | POA: Diagnosis not present

## 2020-06-29 MED FILL — MEDROXYPROGESTERONE ACETATE: 150 | 84 days supply | Qty: 1 | Fill #0

## 2020-07-01 DIAGNOSIS — K5010 Crohn's disease of large intestine without complications: Secondary | ICD-10-CM | POA: Diagnosis not present

## 2020-07-01 DIAGNOSIS — L989 Disorder of the skin and subcutaneous tissue, unspecified: Secondary | ICD-10-CM | POA: Diagnosis not present

## 2020-07-04 MED FILL — FREESTYLE LIBRE 2 SENSOR MI: 14 days supply | Qty: 1 | Fill #2

## 2020-07-11 MED FILL — ATORVASTATIN CALCIUM 10 MG: 10 | 90 days supply | Qty: 90 | Fill #1

## 2020-07-20 MED FILL — FARXIGA 10 MG TABLET: 10 | 30 days supply | Qty: 30 | Fill #1

## 2020-07-20 MED FILL — HUMIRA PEN 40 MG/0.8ML PNKT: 40 | 28 days supply | Qty: 2 | Fill #1

## 2020-08-13 MED FILL — HUMIRA PEN 40 MG/0.8ML PNKT: 40 | 28 days supply | Qty: 2 | Fill #2

## 2020-08-13 MED FILL — LOSARTAN POTASSIUM 50 MG TA: 50 | 90 days supply | Qty: 90 | Fill #1

## 2020-08-27 MED FILL — FARXIGA 10 MG TABLET: 10 | 30 days supply | Qty: 30 | Fill #2

## 2020-08-27 MED FILL — FREESTYLE LIBRE 2 SENSOR MI: 28 days supply | Qty: 2 | Fill #3

## 2020-09-01 ENCOUNTER — Other Ambulatory Visit (HOSPITAL_COMMUNITY): Payer: Self-pay | Admitting: Internal Medicine

## 2020-09-01 DIAGNOSIS — Z794 Long term (current) use of insulin: Secondary | ICD-10-CM | POA: Diagnosis not present

## 2020-09-01 DIAGNOSIS — E1165 Type 2 diabetes mellitus with hyperglycemia: Secondary | ICD-10-CM | POA: Diagnosis not present

## 2020-09-01 DIAGNOSIS — E7800 Pure hypercholesterolemia, unspecified: Secondary | ICD-10-CM | POA: Diagnosis not present

## 2020-09-01 DIAGNOSIS — R5383 Other fatigue: Secondary | ICD-10-CM | POA: Diagnosis not present

## 2020-09-01 DIAGNOSIS — I10 Essential (primary) hypertension: Secondary | ICD-10-CM | POA: Diagnosis not present

## 2020-09-01 DIAGNOSIS — K5010 Crohn's disease of large intestine without complications: Secondary | ICD-10-CM | POA: Diagnosis not present

## 2020-09-01 DIAGNOSIS — F419 Anxiety disorder, unspecified: Secondary | ICD-10-CM | POA: Diagnosis not present

## 2020-09-02 MED FILL — ESCITALOPRAM 20 MG TABLET: 20 | 90 days supply | Qty: 180 | Fill #1

## 2020-09-02 MED FILL — PANTOPRAZOLE SOD DR 40 MG T: 40 | 30 days supply | Qty: 30 | Fill #1

## 2020-09-02 MED FILL — ALBUTEROL SULFATE HFA 108 (: 108 (90 BAS | 30 days supply | Qty: 18 | Fill #0

## 2020-09-10 MED FILL — HUMIRA PEN 40 MG/0.8ML PNKT: 40 | 28 days supply | Qty: 2 | Fill #3

## 2020-09-11 ENCOUNTER — Other Ambulatory Visit (HOSPITAL_COMMUNITY): Payer: Self-pay | Admitting: Internal Medicine

## 2020-09-11 MED FILL — FLUCONAZOLE 150 MG TABS: 150 | 3 days supply | Qty: 2 | Fill #0

## 2020-09-14 DIAGNOSIS — Z3049 Encounter for surveillance of other contraceptives: Secondary | ICD-10-CM | POA: Diagnosis not present

## 2020-09-14 MED FILL — MEDROXYPROGESTERONE ACETATE: 150 | 84 days supply | Qty: 1 | Fill #1

## 2020-09-17 ENCOUNTER — Other Ambulatory Visit (HOSPITAL_COMMUNITY): Payer: Self-pay | Admitting: Obstetrics and Gynecology

## 2020-09-17 MED FILL — SPIRONOLACTONE 50 MG TABLET: 50 | 30 days supply | Qty: 30 | Fill #0

## 2020-09-22 ENCOUNTER — Other Ambulatory Visit (HOSPITAL_COMMUNITY): Payer: Self-pay | Admitting: Internal Medicine

## 2020-09-22 MED FILL — HUMULIN R 500 UNITS/ML KWIK: 500 | 81 days supply | Qty: 36 | Fill #0

## 2020-09-29 ENCOUNTER — Other Ambulatory Visit (HOSPITAL_COMMUNITY): Payer: Self-pay | Admitting: Obstetrics and Gynecology

## 2020-09-29 MED FILL — FLUCONAZOLE 150 MG TABS: 150 | 1 days supply | Qty: 1 | Fill #0

## 2020-09-29 MED FILL — NYSTATIN-TRIAMCINOLONE CRM: 100000-0.1 | 7 days supply | Qty: 15 | Fill #0

## 2020-10-05 MED FILL — ZONISAMIDE 25 MG CAPSULE: 25 | 30 days supply | Qty: 60 | Fill #1

## 2020-10-05 MED FILL — FARXIGA 10 MG TABLET: 10 | 30 days supply | Qty: 30 | Fill #3

## 2020-10-08 MED FILL — HUMIRA PEN 40 MG/0.8ML PNKT: 40 | 28 days supply | Qty: 2 | Fill #4

## 2020-11-14 MED FILL — FREESTYLE LIBRE 2 SENSOR MI: 28 days supply | Qty: 2 | Fill #4

## 2020-11-14 MED FILL — SPIRONOLACTONE 50 MG TABLET: 50 | 30 days supply | Qty: 30 | Fill #1

## 2020-11-19 MED FILL — HUMIRA PEN 40 MG/0.8ML PNKT: 40 | 28 days supply | Qty: 2 | Fill #5

## 2020-11-25 DIAGNOSIS — Z0100 Encounter for examination of eyes and vision without abnormal findings: Secondary | ICD-10-CM | POA: Diagnosis not present

## 2020-12-01 DIAGNOSIS — Z3049 Encounter for surveillance of other contraceptives: Secondary | ICD-10-CM | POA: Diagnosis not present

## 2020-12-01 MED FILL — medroxyPROGESTERone ACETATE: 150 | 84 days supply | Qty: 1 | Fill #0

## 2020-12-05 MED FILL — FARXIGA 10 MG TABLET: 10 | 30 days supply | Qty: 30 | Fill #4

## 2020-12-07 ENCOUNTER — Other Ambulatory Visit (HOSPITAL_COMMUNITY): Payer: Self-pay | Admitting: Internal Medicine

## 2020-12-07 MED FILL — ATORVASTATIN CALCIUM 10 MG: 10 | 90 days supply | Qty: 90 | Fill #0

## 2020-12-07 MED FILL — PANTOPRAZOLE SOD DR 40 MG T: 40 | 30 days supply | Qty: 30 | Fill #0

## 2020-12-09 ENCOUNTER — Other Ambulatory Visit (HOSPITAL_COMMUNITY): Payer: Self-pay | Admitting: Internal Medicine

## 2020-12-09 ENCOUNTER — Other Ambulatory Visit: Payer: Self-pay | Admitting: Pharmacist

## 2020-12-09 MED ORDER — HUMIRA PEN 40 MG/0.8ML ~~LOC~~ PNKT: PEN_INJECTOR | SUBCUTANEOUS | 5 refills | Status: DC

## 2020-12-11 ENCOUNTER — Other Ambulatory Visit (HOSPITAL_COMMUNITY): Payer: Self-pay | Admitting: Gastroenterology

## 2020-12-16 MED FILL — HUMIRA PEN 40 MG/0.8ML PNKT: 40 | 28 days supply | Qty: 2 | Fill #0

## 2020-12-18 ENCOUNTER — Other Ambulatory Visit (HOSPITAL_COMMUNITY): Payer: Self-pay | Admitting: Internal Medicine

## 2020-12-18 DIAGNOSIS — Z79899 Other long term (current) drug therapy: Secondary | ICD-10-CM | POA: Diagnosis not present

## 2020-12-18 DIAGNOSIS — R197 Diarrhea, unspecified: Secondary | ICD-10-CM | POA: Diagnosis not present

## 2020-12-18 DIAGNOSIS — R5383 Other fatigue: Secondary | ICD-10-CM | POA: Diagnosis not present

## 2020-12-18 DIAGNOSIS — Z794 Long term (current) use of insulin: Secondary | ICD-10-CM | POA: Diagnosis not present

## 2020-12-18 DIAGNOSIS — I10 Essential (primary) hypertension: Secondary | ICD-10-CM | POA: Diagnosis not present

## 2020-12-18 DIAGNOSIS — E559 Vitamin D deficiency, unspecified: Secondary | ICD-10-CM | POA: Diagnosis not present

## 2020-12-18 DIAGNOSIS — E1165 Type 2 diabetes mellitus with hyperglycemia: Secondary | ICD-10-CM | POA: Diagnosis not present

## 2020-12-18 MED FILL — TRULICITY 0.75 MG/0.5 ML PE: 0.75 | 84 days supply | Qty: 6 | Fill #0

## 2020-12-24 DIAGNOSIS — H5213 Myopia, bilateral: Secondary | ICD-10-CM | POA: Diagnosis not present

## 2020-12-31 MED FILL — LOSARTAN POTASSIUM 50 MG TA: 50 | 30 days supply | Qty: 30 | Fill #2

## 2021-01-04 ENCOUNTER — Other Ambulatory Visit (HOSPITAL_COMMUNITY): Payer: Self-pay | Admitting: Internal Medicine

## 2021-01-04 MED FILL — LORAZEPAM 1 MG TABS: 1 | 30 days supply | Qty: 20 | Fill #0

## 2021-01-05 MED FILL — DEXCOM G6 SENSOR MISC: 30 days supply | Qty: 3 | Fill #0

## 2021-01-05 MED FILL — DEXCOM G6 TRANSMITTER MISC: 90 days supply | Qty: 1 | Fill #0

## 2021-01-06 ENCOUNTER — Other Ambulatory Visit (HOSPITAL_COMMUNITY): Payer: Self-pay | Admitting: Internal Medicine

## 2021-01-06 MED FILL — DEXCOM G6 RECEIVER DEVI: 90 days supply | Qty: 1 | Fill #0

## 2021-01-14 MED FILL — HUMIRA PEN 40 MG/0.8ML PNKT: 40 | 28 days supply | Qty: 2 | Fill #1

## 2021-01-15 DIAGNOSIS — R635 Abnormal weight gain: Secondary | ICD-10-CM | POA: Diagnosis not present

## 2021-01-15 DIAGNOSIS — N951 Menopausal and female climacteric states: Secondary | ICD-10-CM | POA: Diagnosis not present

## 2021-01-26 ENCOUNTER — Other Ambulatory Visit (HOSPITAL_COMMUNITY): Payer: Self-pay | Admitting: Internal Medicine

## 2021-01-26 DIAGNOSIS — Z794 Long term (current) use of insulin: Secondary | ICD-10-CM | POA: Diagnosis not present

## 2021-01-26 DIAGNOSIS — E1165 Type 2 diabetes mellitus with hyperglycemia: Secondary | ICD-10-CM | POA: Diagnosis not present

## 2021-01-26 DIAGNOSIS — I10 Essential (primary) hypertension: Secondary | ICD-10-CM | POA: Diagnosis not present

## 2021-01-26 DIAGNOSIS — R5383 Other fatigue: Secondary | ICD-10-CM | POA: Diagnosis not present

## 2021-01-26 DIAGNOSIS — R197 Diarrhea, unspecified: Secondary | ICD-10-CM | POA: Diagnosis not present

## 2021-01-26 MED FILL — TRULICITY 1.5 MG/0.5 ML PEN: 1.5 | 84 days supply | Qty: 6 | Fill #0

## 2021-01-27 DIAGNOSIS — K5900 Constipation, unspecified: Secondary | ICD-10-CM | POA: Diagnosis not present

## 2021-01-27 DIAGNOSIS — Z1159 Encounter for screening for other viral diseases: Secondary | ICD-10-CM | POA: Diagnosis not present

## 2021-01-27 DIAGNOSIS — K50111 Crohn's disease of large intestine with rectal bleeding: Secondary | ICD-10-CM | POA: Diagnosis not present

## 2021-01-27 DIAGNOSIS — K6289 Other specified diseases of anus and rectum: Secondary | ICD-10-CM | POA: Diagnosis not present

## 2021-02-02 ENCOUNTER — Other Ambulatory Visit (HOSPITAL_COMMUNITY): Payer: Self-pay

## 2021-02-09 ENCOUNTER — Other Ambulatory Visit (HOSPITAL_COMMUNITY): Payer: Self-pay

## 2021-02-09 MED ORDER — PEG 3350-KCL-NA BICARB-NACL 420 G PO SOLR
ORAL | 0 refills | Status: DC
Start: 2021-01-27 — End: 2021-11-18
  Filled 2021-02-09: qty 4000, 1d supply, fill #0

## 2021-02-09 MED FILL — Losartan Potassium Tab 50 MG: ORAL | 90 days supply | Qty: 90 | Fill #0 | Status: AC

## 2021-02-10 ENCOUNTER — Other Ambulatory Visit (HOSPITAL_COMMUNITY): Payer: Self-pay

## 2021-02-10 MED FILL — Adalimumab Auto-injector Kit 40 MG/0.8ML: SUBCUTANEOUS | 28 days supply | Qty: 2 | Fill #0 | Status: AC

## 2021-02-11 ENCOUNTER — Other Ambulatory Visit (HOSPITAL_COMMUNITY): Payer: Self-pay

## 2021-02-11 DIAGNOSIS — R194 Change in bowel habit: Secondary | ICD-10-CM | POA: Diagnosis not present

## 2021-02-11 DIAGNOSIS — K5289 Other specified noninfective gastroenteritis and colitis: Secondary | ICD-10-CM | POA: Diagnosis not present

## 2021-02-11 DIAGNOSIS — R1084 Generalized abdominal pain: Secondary | ICD-10-CM | POA: Diagnosis not present

## 2021-02-11 DIAGNOSIS — K5730 Diverticulosis of large intestine without perforation or abscess without bleeding: Secondary | ICD-10-CM | POA: Diagnosis not present

## 2021-02-11 DIAGNOSIS — K648 Other hemorrhoids: Secondary | ICD-10-CM | POA: Diagnosis not present

## 2021-02-15 ENCOUNTER — Other Ambulatory Visit (HOSPITAL_COMMUNITY): Payer: Self-pay

## 2021-02-15 MED FILL — Medroxyprogesterone Acetate IM Susp Prefilled Syr 150 MG/ML: INTRAMUSCULAR | 84 days supply | Qty: 1 | Fill #0 | Status: AC

## 2021-02-16 ENCOUNTER — Other Ambulatory Visit (HOSPITAL_COMMUNITY): Payer: Self-pay

## 2021-02-16 DIAGNOSIS — Z3049 Encounter for surveillance of other contraceptives: Secondary | ICD-10-CM | POA: Diagnosis not present

## 2021-02-21 MED FILL — Dapagliflozin Propanediol Tab 10 MG (Base Equivalent): ORAL | 30 days supply | Qty: 30 | Fill #0 | Status: AC

## 2021-02-22 ENCOUNTER — Other Ambulatory Visit (HOSPITAL_COMMUNITY): Payer: Self-pay

## 2021-02-22 MED ORDER — PROMETHAZINE HCL 25 MG PO TABS
ORAL_TABLET | ORAL | 1 refills | Status: DC
  Filled 2021-02-22: qty 90, 30d supply, fill #0

## 2021-02-23 ENCOUNTER — Other Ambulatory Visit (HOSPITAL_COMMUNITY): Payer: Self-pay

## 2021-02-23 MED ORDER — HYOSCYAMINE SULFATE ER 0.375 MG PO TB12
0.3750 mg | ORAL_TABLET | Freq: Every day | ORAL | 1 refills | Status: DC | PRN
  Filled 2021-02-23: qty 30, 30d supply, fill #0
  Filled 2021-07-10: qty 30, 30d supply, fill #1

## 2021-03-09 ENCOUNTER — Emergency Department (HOSPITAL_COMMUNITY)
Admission: EM | Admit: 2021-03-09 | Discharge: 2021-03-09 | Disposition: A | Discharge status: Home / Self Care | Payer: 59 | Attending: Emergency Medicine | Admitting: Emergency Medicine

## 2021-03-09 ENCOUNTER — Other Ambulatory Visit: Payer: Self-pay

## 2021-03-09 ENCOUNTER — Encounter (HOSPITAL_COMMUNITY): Payer: Self-pay | Admitting: *Deleted

## 2021-03-09 ENCOUNTER — Other Ambulatory Visit (HOSPITAL_COMMUNITY): Payer: Self-pay

## 2021-03-09 ENCOUNTER — Emergency Department (HOSPITAL_COMMUNITY): Payer: 59

## 2021-03-09 DIAGNOSIS — Z794 Long term (current) use of insulin: Secondary | ICD-10-CM | POA: Diagnosis not present

## 2021-03-09 DIAGNOSIS — Z7984 Long term (current) use of oral hypoglycemic drugs: Secondary | ICD-10-CM | POA: Insufficient documentation

## 2021-03-09 DIAGNOSIS — E119 Type 2 diabetes mellitus without complications: Secondary | ICD-10-CM | POA: Diagnosis not present

## 2021-03-09 DIAGNOSIS — K529 Noninfective gastroenteritis and colitis, unspecified: Secondary | ICD-10-CM | POA: Insufficient documentation

## 2021-03-09 DIAGNOSIS — I10 Essential (primary) hypertension: Secondary | ICD-10-CM | POA: Insufficient documentation

## 2021-03-09 DIAGNOSIS — Z79899 Other long term (current) drug therapy: Secondary | ICD-10-CM | POA: Insufficient documentation

## 2021-03-09 DIAGNOSIS — R1110 Vomiting, unspecified: Secondary | ICD-10-CM | POA: Diagnosis not present

## 2021-03-09 DIAGNOSIS — R109 Unspecified abdominal pain: Secondary | ICD-10-CM | POA: Diagnosis not present

## 2021-03-09 LAB — CBC WITH DIFFERENTIAL/PLATELET
Abs Immature Granulocytes: 0.01 10*3/uL (ref 0.00–0.07)
Basophils Absolute: 0 10*3/uL (ref 0.0–0.1)
Basophils Relative: 0 %
Eosinophils Absolute: 0.1 10*3/uL (ref 0.0–0.5)
Eosinophils Relative: 1 %
HCT: 46.2 % — ABNORMAL HIGH (ref 36.0–46.0)
Hemoglobin: 15.5 g/dL — ABNORMAL HIGH (ref 12.0–15.0)
Immature Granulocytes: 0 %
Lymphocytes Relative: 38 %
Lymphs Abs: 2.9 10*3/uL (ref 0.7–4.0)
MCH: 27.6 pg (ref 26.0–34.0)
MCHC: 33.5 g/dL (ref 30.0–36.0)
MCV: 82.2 fL (ref 80.0–100.0)
Monocytes Absolute: 0.4 10*3/uL (ref 0.1–1.0)
Monocytes Relative: 5 %
Neutro Abs: 4.3 10*3/uL (ref 1.7–7.7)
Neutrophils Relative %: 56 %
Platelets: 275 10*3/uL (ref 150–400)
RBC: 5.62 MIL/uL — ABNORMAL HIGH (ref 3.87–5.11)
RDW: 12.7 % (ref 11.5–15.5)
WBC: 7.7 10*3/uL (ref 4.0–10.5)
nRBC: 0 % (ref 0.0–0.2)

## 2021-03-09 LAB — COMPREHENSIVE METABOLIC PANEL
ALT: 22 U/L (ref 0–44)
AST: 16 U/L (ref 15–41)
Albumin: 4.2 g/dL (ref 3.5–5.0)
Alkaline Phosphatase: 64 U/L (ref 38–126)
Anion gap: 9 (ref 5–15)
BUN: 10 mg/dL (ref 6–20)
CO2: 23 mmol/L (ref 22–32)
Calcium: 9.5 mg/dL (ref 8.9–10.3)
Chloride: 103 mmol/L (ref 98–111)
Creatinine, Ser: 0.8 mg/dL (ref 0.44–1.00)
GFR, Estimated: >60 mL/min (ref >60)
Glucose, Bld: 464 mg/dL — ABNORMAL HIGH (ref 70–99)
Potassium: 3.9 mmol/L (ref 3.5–5.1)
Sodium: 135 mmol/L (ref 135–145)
Total Bilirubin: 0.8 mg/dL (ref 0.3–1.2)
Total Protein: 7.9 g/dL (ref 6.5–8.1)

## 2021-03-09 LAB — URINALYSIS, ROUTINE W REFLEX MICROSCOPIC
Bacteria, UA: NONE SEEN
Bilirubin Urine: NEGATIVE
Glucose, UA: >=500 mg/dL — AB
Hgb urine dipstick: NEGATIVE
Ketones, ur: NEGATIVE mg/dL
Leukocytes,Ua: NEGATIVE
Nitrite: NEGATIVE
Protein, ur: NEGATIVE mg/dL
Specific Gravity, Urine: 1.024 (ref 1.005–1.030)
pH: 6 (ref 5.0–8.0)

## 2021-03-09 LAB — CBG MONITORING, ED
Glucose-Capillary: 370 mg/dL — ABNORMAL HIGH (ref 70–99)
Glucose-Capillary: 419 mg/dL — ABNORMAL HIGH (ref 70–99)
Glucose-Capillary: 268 mg/dL — ABNORMAL HIGH (ref 70–99)

## 2021-03-09 LAB — LIPASE, BLOOD: Lipase: 49 U/L (ref 11–51)

## 2021-03-09 LAB — I-STAT BETA HCG BLOOD, ED (MC, WL, AP ONLY): I-stat hCG, quantitative: <5 m[IU]/mL (ref <5)

## 2021-03-09 MED ORDER — INSULIN ASPART 100 UNIT/ML IV SOLN
10.0000 [IU] | Freq: Once | INTRAVENOUS | Status: AC
  Administered 2021-03-09: 10 [IU] via INTRAVENOUS

## 2021-03-09 MED ORDER — CIPROFLOXACIN HCL 250 MG PO TABS
500.0000 mg | ORAL_TABLET | Freq: Once | ORAL | Status: AC
  Administered 2021-03-09: 500 mg via ORAL
  Filled 2021-03-09: qty 2

## 2021-03-09 MED ORDER — SODIUM CHLORIDE 0.9 % IV BOLUS
1000.0000 mL | Freq: Once | INTRAVENOUS | Status: AC
  Administered 2021-03-09: 1000 mL via INTRAVENOUS

## 2021-03-09 MED ORDER — CIPROFLOXACIN HCL 500 MG PO TABS: 500.0000 mg | ORAL_TABLET | Freq: Two times a day (BID) | ORAL | 0 refills | Status: DC

## 2021-03-09 MED ORDER — ONDANSETRON HCL 4 MG/2ML IJ SOLN
4.0000 mg | Freq: Once | INTRAMUSCULAR | Status: AC
  Administered 2021-03-09: 4 mg via INTRAVENOUS
  Filled 2021-03-09: qty 2

## 2021-03-09 MED ORDER — IOHEXOL 300 MG/ML  SOLN
100.0000 mL | Freq: Once | INTRAMUSCULAR | Status: AC | PRN
  Administered 2021-03-09: 100 mL via INTRAVENOUS

## 2021-03-09 NOTE — Discharge Instructions (Addendum)
Please pick up antibiotics and take as prescribed to cover for colitis (infection in your colon) at this time. Drink plenty of fluids to stay hydrated. Continue taking your phenergan at home as needed for nausea.   Follow up with your GI specialist regarding your ED visit today and for further evaluation  Return to the ED for any new/worsening symptoms

## 2021-03-09 NOTE — ED Provider Notes (Signed)
[facility] EMERGENCY DEPARTMENT Provider Note   CSN: [id number] Arrival date & time: 03/09/21  1838     History Chief Complaint  Patient presents with  . Abdominal Pain    Cindy Long is a 45 y.o. female with PMHx HTN, high cholesterol, Diabetes, GERD, and Crohn's disease who presents to the ED today with complaint of gradual onset, constant, diffuse, achy, abdominal pain for the past 4 days. Pt also complains of nausea and diarrhea.  Reports the symptoms started after she ate out at a restaurant.  She ate shrimp.  No one else in her family ate the same thing.  She reports that shortly after leaving the restaurant she began to feel ill and feeling like she had to use the restroom.  She has continued to have diarrhea since then.  She has taken hyoscyamine as well as Phenergan without much relief prompting her to come to the ED today.  She denies fevers or chills.  She is unsure regarding last normal menstrual period as she gets the Depo injection and has irregular periods.  She states this feels different than typical Crohn's flare. No other complaints at this time.   The history is provided by the patient and medical records.       Past Medical History:  Diagnosis Date  . Crohn disease ([place])   . Diabetes mellitus    IDDM  . GERD (gastroesophageal reflux disease)    no meds currently  . High cholesterol   . Hypertension   . Mastitis    right breast  . Postpartum care following cesarean delivery (2/10) 12/17/2013    Patient Active Problem List   Diagnosis Date Noted  . Postpartum care following cesarean delivery (2/10) 12/17/2013  . Indication for care in labor or delivery 11/30/2013  . Preexisting diabetes complicating pregnancy in second trimester, antepartum 08/18/2013  . IBS (irritable bowel syndrome) 02/05/2013  . Other and unspecified hyperlipidemia 02/05/2013  . Abnormal uterine bleeding 06/18/2012  . Pelvic pain 06/18/2012  . Type II diabetes mellitus ([place])  06/18/2012    Past Surgical History:  Procedure Laterality Date  . CERVICAL CERCLAGE    . CERVICAL CERCLAGE N/A 06/21/2013   Procedure: Linna Caprice CERVICAL;  Surgeon: Marvene Staff, MD;  Location: [facility];  Service: Gynecology;  Laterality: N/A;  . CESAREAN SECTION    . CESAREAN SECTION N/A 12/17/2013   Procedure: Repeat CESAREAN SECTION with Cerclage Removal;  Surgeon: Marvene Staff, MD;  Location: [facility];  Service: Obstetrics;  Laterality: N/A;  EDD: 12/22/13  . CHOLECYSTECTOMY    . CHOLECYSTECTOMY OPEN  08/2011  . LAPAROSCOPIC ENDOMETRIOSIS FULGURATION  01/2011     OB History    Gravida  2   Para  2   Term  2   Preterm      AB      Living  2     SAB      IAB      Ectopic      Multiple      Live Births  2           Family History  Problem Relation Age of Onset  . Diabetes Maternal Aunt   . Cancer Maternal Grandmother        breast, colon  . Diabetes Maternal Grandmother     Social History   Tobacco Use  . Smoking status: Never Smoker  . Smokeless tobacco: Never Used  Substance Use Topics  . Alcohol use: Yes  Comment: occasionally  . Drug use: No    Home Medications Prior to Admission medications   Medication Sig Start Date End Date Taking? Authorizing Provider  Adalimumab (HUMIRA PEN) 40 MG/0.8ML PNKT Inject under the skin as directed every 14 days. 12/09/20  Yes Tresa Garter, MD  albuterol (VENTOLIN HFA) 108 (90 Base) MCG/ACT inhaler INHALE 1 PUFFS BY MOUTH INTO THE LUNGS EVERY 4 HOURS AS NEEDED 09/01/20 09/01/21 Yes Polite, Jori Moll, MD  atorvastatin (LIPITOR) 10 MG tablet TAKE 1 TABLET BY MOUTH EVERY EVENING AT BEDTIME 12/07/20 12/07/21 Yes Delrae Rend, MD  Bacillus Coagulans-Inulin (PROBIOTIC) 1-250 BILLION-MG CAPS Take 1 capsule by mouth daily.   Yes [provider]  Biotin 10000 MCG TABS Take 1 tablet by mouth daily.   Yes [provider]  ciprofloxacin (CIPRO) 500 MG tablet Take 1 tablet (500 mg  total) by mouth 2 (two) times daily. 03/09/21  Yes Kemia Wendel, PA-C  dapagliflozin propanediol (FARXIGA) 10 MG TABS tablet TAKE 1 TABLET BY MOUTH ONCE DAILY 04/28/20 04/28/21 Yes Delrae Rend, MD  escitalopram (LEXAPRO) 20 MG tablet TAKE 2 TABLETS BY MOUTH ONCE A DAY. 05/19/20 05/19/21 Yes Polite, Jori Moll, MD  FARXIGA 5 MG TABS tablet Take 10 mg by mouth daily. 04/07/20  Yes [provider]  hyoscyamine (LEVBID) 0.375 MG 12 hr tablet Take 1 tablet (0.375 mg total) by mouth daily as needed. 02/22/21  Yes Ronnette Juniper, MD  insulin regular human CONCENTRATED (HUMULIN R) 500 UNIT/ML kwikpen INJECT 100 U UNDER THE SKIN BEFORE BREAKFAST, 70 UNITS BEFORE LUNCH AND 50 UNITS BEFORE EVENING MEAL. MUST HAVE OFFICE VISIT BEFORE NEXT REFILL Patient taking differently: Inject 90 before lunch and 50 every evening 09/22/20 09/22/21 Yes Delrae Rend, MD  LORazepam (ATIVAN) 1 MG tablet TAKE 1/2 TABLET BY MOUTH DAILY AS NEEDED FOR 30 DAYS 01/04/21 07/03/21 Yes Polite, Jori Moll, MD  losartan (COZAAR) 50 MG tablet Take 50 mg by mouth daily. 10/18/19  Yes [provider]  medroxyPROGESTERone (DEPO-PROVERA) 150 MG/ML injection Inject 150 mg into the muscle every 3 (three) months.   Yes [provider]  Melatonin 10 MG CAPS Take 1 capsule by mouth daily.   Yes [provider]  Probiotic Product (CULTURELLE IMMUNE DEFENSE PO) Take 1 tablet by mouth daily.   Yes [provider]  promethazine (PHENERGAN) 25 MG tablet TAKE 1 TABLET BY MOUTH EVERY 8 HOURS 02/22/21  Yes   spironolactone (ALDACTONE) 50 MG tablet TAKE 1 TABLET BY MOUTH ONCE DAILY 09/17/20 09/17/21 Yes Servando Salina, MD  Adalimumab 40 MG/0.8ML PNKT INJECT UNDER THE SKIN AS DIRECTED EVERY 14 DAYS. Patient not taking: Reported on 03/09/2021 12/09/20 12/09/21  Tresa Garter, MD  Adalimumab 40 MG/0.8ML PNKT INJECT 40 MG INTO THE SKIN EVERY 14 (FOURTEEN) DAYS. Patient not taking: Reported on 03/09/2021 06/22/20 06/22/21  Tresa Garter, MD  albuterol (PROVENTIL HFA;VENTOLIN HFA) 108 (90 Base) MCG/ACT inhaler Inhale 1-2 puffs into the lungs every 6 (six) hours as needed for wheezing or shortness of breath. Patient not taking: Reported on 03/09/2021    [provider]  atorvastatin (LIPITOR) 10 MG tablet Take 10 mg by mouth daily. Patient not taking: Reported on 03/09/2021 12/02/19   [provider]  Continuous Blood Gluc Receiver ([place]) DEVI USE AS DIRECTED 01/06/21 01/06/22  Delrae Rend, MD  Continuous Blood Gluc Sensor (DEXCOM G6 SENSOR) [place] CHANGE SENSOR EVERY 10 DAYS 12/18/20 12/18/21  Delrae Rend, MD  Continuous Blood Gluc Sensor (FREESTYLE LIBRE 14 DAY SENSOR)  MISC APPLY TO THE BACK OF THE UPPER ARM EVERY 14 DAYS 03/23/20   [provider]  Continuous Blood Gluc Sensor (FREESTYLE LIBRE 2 SENSOR) MISC CHANGE SENSOR EVERY 14 DAYS 04/07/20 04/07/21  Delrae Rend, MD  Continuous Blood Gluc Transmit (DEXCOM G6 TRANSMITTER) MISC CHANGE TRANSMITTER ONCE EVERY 90 DAYS 12/18/20 12/18/21  Delrae Rend, MD  Dulaglutide 0.75 MG/0.5ML SOPN INJECT 0.75MG UNDER THE SKIN ONCE A WEEK Patient not taking: Reported on 03/09/2021 12/18/20 12/18/21  Delrae Rend, MD  Dulaglutide 1.5 MG/0.5ML SOPN INJECT 1 PEN UNDER THE SKIN ONCE A WEEK. Patient not taking: Reported on 03/09/2021 01/26/21 01/26/22  Delrae Rend, MD  escitalopram (LEXAPRO) 5 MG tablet Take 1 tablet by mouth 3 (three) times daily. Patient not taking: No sig reported 09/18/19   [provider]  fluconazole (DIFLUCAN) 150 MG tablet TAKE 1 TABLET BY MOUTH FOR 1 DOSE AS DIRECTED Patient not taking: No sig reported 09/29/20 09/29/21  Servando Salina, MD  fluconazole (DIFLUCAN) 150 MG tablet TAKE 1 TABLET BY MOUTH EVERY OTHER DAY Patient not taking: Reported on 03/09/2021 09/11/20 09/11/21  Seward Carol, MD  gabapentin (NEURONTIN) 300 MG capsule Take 1 capsule by mouth 3 (three) times daily. Patient not taking: No sig reported 10/18/19    [provider]  HUMULIN R U-500 KWIKPEN 500 UNIT/ML kwikpen Inject into the skin See admin instructions. Inject 90 at lunch and 50 at dinner Patient not taking: No sig reported 01/22/20   [provider]  hydrOXYzine (ATARAX/VISTARIL) 25 MG tablet Take 1 tablet (25 mg total) by mouth every 8 (eight) hours as needed. Patient not taking: Reported on 03/09/2021 05/01/20   [place], [place], PA-C  hyoscyamine (LEVSIN, ANASPAZ) 0.125 MG tablet Take 1-2 tablets (0.125-0.25 mg total) by mouth every 4 (four) hours as needed. Patient not taking: No sig reported 02/15/17   Evalee Jefferson, PA-C  ibuprofen (ADVIL) 600 MG tablet Take 1 tablet (600 mg total) by mouth every 6 (six) hours as needed. Patient not taking: No sig reported 10/07/19   Melynda Ripple, MD  Insulin Pen Needle 32G X 4 MM MISC by Does not apply route. BD U/F Pen Needles Nano 4 mm x 32 G; use with insulin    [provider]  LORazepam (ATIVAN) 1 MG tablet Take 0.5 mg by mouth daily as needed. Patient not taking: Reported on 03/09/2021 03/23/20   [provider]  losartan (COZAAR) 50 MG tablet TAKE 1 TABLET BY MOUTH ONCE DAILY 04/24/20 05/17/21  Seward Carol, MD  medroxyPROGESTERone Acetate 150 MG/ML SUSY INJECT 1 ML INTO THE MUSCLE EVERY 3 MONTH Patient not taking: Reported on 03/09/2021 09/17/20 09/17/21  Servando Salina, MD  medroxyPROGESTERone Acetate 150 MG/ML SUSY INJECT 1 ML EVERY 11-12 WEEKS Patient not taking: Reported on 03/09/2021 05/19/20 05/19/21  Servando Salina, MD  nystatin-triamcinolone (MYCOLOG II) cream APPLY TO THE AFFECTED AREA(S) TWO TIMES DAILY AS DIRECTED Patient not taking: Reported on 03/09/2021 09/29/20 09/29/21  Servando Salina, MD  pantoprazole (PROTONIX) 40 MG tablet TAKE 1 TABLET BY MOUTH ONCE DAILY Patient not taking: No sig reported 12/11/20 12/11/21  Ronnette Juniper, MD  pantoprazole (PROTONIX) 40 MG tablet TAKE 1 TABLET BY MOUTH ONCE A DAY 12/07/20 12/07/21  Ronnette Juniper, MD   pantoprazole (PROTONIX) 40 MG tablet TAKE 1 TABLET BY MOUTH ONCE A DAY 12/07/20 12/07/21  Ronnette Juniper, MD  polyethylene glycol-electrolytes (NULYTELY) 420 g solution Use as directed Patient not taking: No sig reported 01/27/21   Ronnette Juniper, MD  promethazine (PHENERGAN) 25  MG tablet Take 25 mg by mouth every 6 (six) hours as needed for nausea or vomiting. Patient not taking: Reported on 03/09/2021    [provider]  spironolactone (ALDACTONE) 50 MG tablet Take 50 mg by mouth daily. Patient not taking: Reported on 03/09/2021 10/18/19   [provider]  zonisamide (ZONEGRAN) 25 MG capsule TAKE 2 CAPSULES BY MOUTH DAILY Patient not taking: No sig reported 06/19/20 06/19/21  Orie Rout, MD  dicyclomine (BENTYL) 20 MG tablet Take 1 tablet (20 mg total) by mouth every 8 (eight) hours as needed for spasms (abdominal pain). 06/01/16 10/07/19  Jola Schmidt, MD    Allergies    Metronidazole and Shellfish allergy  Review of Systems   Review of Systems  Constitutional: Negative for chills and fever.  Gastrointestinal: Positive for abdominal pain, diarrhea and nausea. Negative for vomiting.  All other systems reviewed and are negative.   Physical Exam Updated Vital Signs BP 139/87   Pulse 86   Temp 98.9 F (37.2 C) (Oral)   Resp 18   Ht 5' 7"  (1.702 m)   Wt 78.2 kg   SpO2 100%   BMI 26.99 kg/m   Physical Exam Vitals and nursing note reviewed.  Constitutional:      Appearance: She is not ill-appearing or diaphoretic.  HENT:     Head: Normocephalic and atraumatic.     Mouth/Throat:     Comments: Dry MM Eyes:     Conjunctiva/sclera: Conjunctivae normal.  Cardiovascular:     Rate and Rhythm: Normal rate and regular rhythm.  Pulmonary:     Effort: Pulmonary effort is normal.     Breath sounds: Normal breath sounds. No wheezing, rhonchi or rales.  Abdominal:     General: Abdomen is flat.     Palpations: Abdomen is soft.     Tenderness: There is abdominal  tenderness in the epigastric area. There is no guarding or rebound.  Musculoskeletal:     Cervical back: Neck supple.  Skin:    General: Skin is warm and dry.  Neurological:     Mental Status: She is alert.     ED Results / Procedures / Treatments   Labs (all labs ordered are listed, but only abnormal results are displayed) Labs Reviewed  COMPREHENSIVE METABOLIC PANEL - Abnormal; Notable for the following components:      Result Value   Glucose, Bld 464 (*)    All other components within normal limits  CBC WITH DIFFERENTIAL/PLATELET - Abnormal; Notable for the following components:   RBC 5.62 (*)    Hemoglobin 15.5 (*)    HCT 46.2 (*)    All other components within normal limits  URINALYSIS, ROUTINE W REFLEX MICROSCOPIC - Abnormal; Notable for the following components:   Color, Urine STRAW (*)    Glucose, UA >=500 (*)    All other components within normal limits  CBG MONITORING, ED - Abnormal; Notable for the following components:   Glucose-Capillary 419 (*)    All other components within normal limits  CBG MONITORING, ED - Abnormal; Notable for the following components:   Glucose-Capillary 370 (*)    All other components within normal limits  CBG MONITORING, ED - Abnormal; Notable for the following components:   Glucose-Capillary 268 (*)    All other components within normal limits  LIPASE, BLOOD  I-STAT BETA HCG BLOOD, ED ([facility], [facility], [facility] ONLY)    EKG None  Radiology CT Abdomen Pelvis W Contrast  Result Date: 03/09/2021 CLINICAL DATA:  Abdominal pain. Vomiting and diarrhea onset 4 days ago EXAM: CT ABDOMEN AND PELVIS WITH CONTRAST TECHNIQUE: Multidetector CT imaging of the abdomen and pelvis was performed using the standard protocol following bolus administration of intravenous contrast. CONTRAST:  185m OMNIPAQUE IOHEXOL 300 MG/ML  SOLN COMPARISON:  11/12/2019 FINDINGS: Lower chest: Unremarkable Hepatobiliary: Cholecystectomy. Cholecystectomy. Otherwise unremarkable.  Pancreas: Unremarkable Spleen: Unremarkable Adrenals/Urinary Tract: Unremarkable Stomach/Bowel: Descending colon diverticulosis without compelling findings of active diverticulitis. Mild sigmoid colon diverticulosis. There is a suggestion of accentuated mucosal enhancement and potentially mild wall thickening in the distal sigmoid colon and rectum which could reflect mild distal colitis, however there is nondistention of the bowel which can sometimes cause a spurious appearance of wall thickening. Normal appendix. No dilated bowel. Vascular/Lymphatic: Unremarkable Reproductive: Unremarkable Other: No supplemental non-categorized findings. Musculoskeletal: Unremarkable IMPRESSION: 1. There is potentially some mild wall thickening in the distal sigmoid colon and rectum which could reflect a low-grade distal colitis. 2. Descending colon diverticulosis without active diverticulitis. Electronically Signed   By: WVan ClinesM.D.   On: 03/09/2021 21:36    Procedures Procedures   Medications Ordered in ED Medications  ciprofloxacin (CIPRO) tablet 500 mg (has no administration in time range)  sodium chloride 0.9 % bolus 1,000 mL (0 mLs Intravenous Stopped 03/09/21 2246)  ondansetron (ZOFRAN) injection 4 mg (4 mg Intravenous Given 03/09/21 2034)  iohexol (OMNIPAQUE) 300 MG/ML solution 100 mL (100 mLs Intravenous Contrast Given 03/09/21 2114)  insulin aspart (novoLOG) injection 10 Units (10 Units Intravenous Given 03/09/21 2125)    ED Course  I have reviewed the triage vital signs and the nursing notes.  Pertinent labs & imaging results that were available during my care of the patient were reviewed by me and considered in my medical decision making (see chart for details).    MDM Rules/Calculators/A&P                          45year old female who presents to the ED today with complaints of abdominal pain, nausea, diarrhea began a couple of days ago.  History of Crohn's however states this feels  different.  On arrival to the ED patient is afebrile.  She is mildly tachycardic at 102 however this is resolved while patient is in her room.  She is nontachypneic.  Appears to be in no acute distress.  She does have epigastric abdominal tenderness palpation on exam.  We will plan to work-up for abdominal pain at this time.  Given age and epigastric tenderness palpation on exam will obtain EKG as well.  Will provide fluids, antiemetics.  Given current history I do feel patient needs a CT scan at this time for further evaluation.   CBC without leukocytosis. Hgb, HCT, and RBC elevated likely s/2 dehydration.  CMP with glucose 464. Bicarb WNL. NO gap. No other electrolyte abnormalities. No concern for DKA. Will provide IV insulin and reassess.  Lipase 49 U/A without infection Beta hcg negative  CT scan: IMPRESSION:  1. There is potentially some mild wall thickening in the distal  sigmoid colon and rectum which could reflect a low-grade distal  colitis.  2. Descending colon diverticulosis without active diverticulitis.   Repeat CBG 268  On reevaluation pt resting comfortably. Has been able to tolerate PO in the ED without difficulty. Given CT findings will treat for coliitis at this time. Pt continues to report this does not feel similar to previous crohn's flares and therefore will treat for infection at  this time. Pt has allergy to flagyl; will treat with cipro. Pt given 1 time dose of oral cipro here in the ED and discharged home with same. Advised to follow up with GI for further eval. She is in agreement with plan and stable for discharge home.   This note was prepared using Dragon voice recognition software and may include unintentional dictation errors due to the inherent limitations of voice recognition software.  Final Clinical Impression(s) / ED Diagnoses Final diagnoses:  Colitis    Rx / DC Orders ED Discharge Orders         Ordered    ciprofloxacin (CIPRO) 500 MG tablet  2 times  daily        03/09/21 2248           Discharge Instructions     Please pick up antibiotics and take as prescribed to cover for colitis (infection in your colon) at this time. Drink plenty of fluids to stay hydrated. Continue taking your phenergan at home as needed for nausea.   Follow up with your GI specialist regarding your ED visit today and for further evaluation  Return to the ED for any new/worsening symptoms       Eustaquio Maize, PA-C 03/09/21 2249    Lajean Saver, MD 03/10/21 1702

## 2021-03-09 NOTE — ED Triage Notes (Signed)
Abdominal pain with vomiting and diarrhea onset 4 days ago

## 2021-03-10 ENCOUNTER — Other Ambulatory Visit (HOSPITAL_COMMUNITY): Payer: Self-pay

## 2021-03-10 MED ORDER — CIPROFLOXACIN HCL 500 MG PO TABS
500.0000 mg | ORAL_TABLET | Freq: Two times a day (BID) | ORAL | 0 refills | Status: DC
  Filled 2021-03-10: qty 14, 7d supply, fill #0

## 2021-03-11 ENCOUNTER — Other Ambulatory Visit (HOSPITAL_COMMUNITY): Payer: Self-pay

## 2021-03-11 MED FILL — Adalimumab Auto-injector Kit 40 MG/0.8ML: SUBCUTANEOUS | 28 days supply | Qty: 2 | Fill #1 | Status: AC

## 2021-03-14 ENCOUNTER — Other Ambulatory Visit: Payer: Self-pay

## 2021-03-14 ENCOUNTER — Emergency Department (HOSPITAL_COMMUNITY)
Admission: EM | Admit: 2021-03-14 | Discharge: 2021-03-14 | Disposition: A | Discharge status: Home / Self Care | Payer: 59 | Attending: Emergency Medicine | Admitting: Emergency Medicine

## 2021-03-14 ENCOUNTER — Encounter (HOSPITAL_COMMUNITY): Payer: Self-pay | Admitting: Emergency Medicine

## 2021-03-14 DIAGNOSIS — Z794 Long term (current) use of insulin: Secondary | ICD-10-CM | POA: Diagnosis not present

## 2021-03-14 DIAGNOSIS — K219 Gastro-esophageal reflux disease without esophagitis: Secondary | ICD-10-CM | POA: Diagnosis not present

## 2021-03-14 DIAGNOSIS — K529 Noninfective gastroenteritis and colitis, unspecified: Secondary | ICD-10-CM | POA: Diagnosis not present

## 2021-03-14 DIAGNOSIS — I10 Essential (primary) hypertension: Secondary | ICD-10-CM | POA: Insufficient documentation

## 2021-03-14 DIAGNOSIS — R6883 Chills (without fever): Secondary | ICD-10-CM | POA: Insufficient documentation

## 2021-03-14 DIAGNOSIS — R000 Tachycardia, unspecified: Secondary | ICD-10-CM | POA: Diagnosis not present

## 2021-03-14 DIAGNOSIS — E119 Type 2 diabetes mellitus without complications: Secondary | ICD-10-CM | POA: Diagnosis not present

## 2021-03-14 DIAGNOSIS — R1013 Epigastric pain: Secondary | ICD-10-CM | POA: Diagnosis present

## 2021-03-14 DIAGNOSIS — Z79899 Other long term (current) drug therapy: Secondary | ICD-10-CM | POA: Diagnosis not present

## 2021-03-14 LAB — COMPREHENSIVE METABOLIC PANEL
ALT: 24 U/L (ref 0–44)
AST: 18 U/L (ref 15–41)
Albumin: 4.3 g/dL (ref 3.5–5.0)
Alkaline Phosphatase: 68 U/L (ref 38–126)
Anion gap: 10 (ref 5–15)
BUN: 11 mg/dL (ref 6–20)
CO2: 22 mmol/L (ref 22–32)
Calcium: 9.5 mg/dL (ref 8.9–10.3)
Chloride: 101 mmol/L (ref 98–111)
Creatinine, Ser: 0.73 mg/dL (ref 0.44–1.00)
GFR, Estimated: >60 mL/min (ref >60)
Glucose, Bld: 462 mg/dL — ABNORMAL HIGH (ref 70–99)
Potassium: 3.9 mmol/L (ref 3.5–5.1)
Sodium: 133 mmol/L — ABNORMAL LOW (ref 135–145)
Total Bilirubin: 0.7 mg/dL (ref 0.3–1.2)
Total Protein: 7.9 g/dL (ref 6.5–8.1)

## 2021-03-14 LAB — CBG MONITORING, ED: Glucose-Capillary: 442 mg/dL — ABNORMAL HIGH (ref 70–99)

## 2021-03-14 LAB — URINALYSIS, ROUTINE W REFLEX MICROSCOPIC
Bacteria, UA: NONE SEEN
Bilirubin Urine: NEGATIVE
Glucose, UA: >=500 mg/dL — AB
Hgb urine dipstick: NEGATIVE
Ketones, ur: NEGATIVE mg/dL
Leukocytes,Ua: NEGATIVE
Nitrite: NEGATIVE
Protein, ur: NEGATIVE mg/dL
Specific Gravity, Urine: 1.024 (ref 1.005–1.030)
pH: 6 (ref 5.0–8.0)

## 2021-03-14 LAB — CBC WITH DIFFERENTIAL/PLATELET
Abs Immature Granulocytes: 0.04 10*3/uL (ref 0.00–0.07)
Basophils Absolute: 0 10*3/uL (ref 0.0–0.1)
Basophils Relative: 0 %
Eosinophils Absolute: 0.1 10*3/uL (ref 0.0–0.5)
Eosinophils Relative: 1 %
HCT: 46.6 % — ABNORMAL HIGH (ref 36.0–46.0)
Hemoglobin: 15.5 g/dL — ABNORMAL HIGH (ref 12.0–15.0)
Immature Granulocytes: 0 %
Lymphocytes Relative: 37 %
Lymphs Abs: 3.4 10*3/uL (ref 0.7–4.0)
MCH: 27.4 pg (ref 26.0–34.0)
MCHC: 33.3 g/dL (ref 30.0–36.0)
MCV: 82.3 fL (ref 80.0–100.0)
Monocytes Absolute: 0.5 10*3/uL (ref 0.1–1.0)
Monocytes Relative: 6 %
Neutro Abs: 5 10*3/uL (ref 1.7–7.7)
Neutrophils Relative %: 56 %
Platelets: 263 10*3/uL (ref 150–400)
RBC: 5.66 MIL/uL — ABNORMAL HIGH (ref 3.87–5.11)
RDW: 12.4 % (ref 11.5–15.5)
WBC: 9 10*3/uL (ref 4.0–10.5)
nRBC: 0 % (ref 0.0–0.2)

## 2021-03-14 LAB — LIPASE, BLOOD: Lipase: 46 U/L (ref 11–51)

## 2021-03-14 LAB — PREGNANCY, URINE: Preg Test, Ur: NEGATIVE

## 2021-03-14 MED ORDER — DICYCLOMINE HCL 10 MG/ML IM SOLN
20.0000 mg | Freq: Once | INTRAMUSCULAR | Status: AC
  Administered 2021-03-14: 20 mg via INTRAMUSCULAR
  Filled 2021-03-14: qty 2

## 2021-03-14 MED ORDER — ONDANSETRON 4 MG PO TBDP: 4.0000 mg | ORAL_TABLET | Freq: Three times a day (TID) | ORAL | 0 refills | Status: DC | PRN

## 2021-03-14 MED ORDER — SODIUM CHLORIDE 0.9 % IV BOLUS
1000.0000 mL | Freq: Once | INTRAVENOUS | Status: AC
  Administered 2021-03-14: 1000 mL via INTRAVENOUS

## 2021-03-14 MED ORDER — DICYCLOMINE HCL 20 MG PO TABS: 20.0000 mg | ORAL_TABLET | Freq: Two times a day (BID) | ORAL | 0 refills | Status: DC

## 2021-03-14 MED ORDER — ONDANSETRON HCL 4 MG/2ML IJ SOLN
4.0000 mg | Freq: Once | INTRAMUSCULAR | Status: AC
  Administered 2021-03-14: 4 mg via INTRAVENOUS
  Filled 2021-03-14: qty 2

## 2021-03-14 NOTE — ED Provider Notes (Signed)
[facility] EMERGENCY DEPARTMENT Provider Note   CSN: [id number] Arrival date & time: 03/14/21  1816     History No chief complaint on file.   Cindy Long is a 45 y.o. female with a history of diabetes mellitus (on insulin and [place]), Crohn's (on Humira) disease, GERD, hypertension, high cholesterol.  Patient presents to the emergency department with a chief complaint of nausea and diarrhea.  Patient reports that she has had the symptoms since 03/09/2021.  Symptoms have been unchanged over this time.  Patient has no relief of nausea when taking her home Phenergan.  Patient denies any episodes of vomiting.  Patient has been able to hold down oral fluids without difficulty.  Patient describes diarrhea as watery.  Patient denies any bloody stool or melena.  Patient reports that she feels her diarrhea is worse whenever she eats any food.  Patient denies any alleviating factors.  Patient endorses associated abdominal pain.  Abdominal pain is located to epigastric area as well as left upper quadrant.  Patient rates her pain 6/10 on the pain scale.  No radiation of pain.  Patient describes pain as ache and occasional stabbing sensation."  Patient denies any change in pain with eating.  Patient reports pain is improved when she is in the fetal position.  Pain is worse with movement.  Patient endorses chills and generalized fatigue.  Patient denies any urinary symptoms, vaginal symptoms, lightheadedness, dizziness, single episode, chest pain, shortness of breath, URI symptoms, fevers.     Patient reports she was diagnosed with colitis after being seen in the emergency department on 5/3.  Patient was started on Cipro and has been taking this medication on an empty stomach.    Patient reports that she has not been taking her insulin or Farxiga over the last few days.  Patient unsure when her last menstrual period was due to being on Depo-Provera shot.  Patient is sexually active in a mutually  monogamous relationship with a female partner.  Patient has no concern for STI at this time.  HPI     Past Medical History:  Diagnosis Date  . Crohn disease ([place])   . Diabetes mellitus    IDDM  . GERD (gastroesophageal reflux disease)    no meds currently  . High cholesterol   . Hypertension   . Mastitis    right breast  . Postpartum care following cesarean delivery (2/10) 12/17/2013    Patient Active Problem List   Diagnosis Date Noted  . Postpartum care following cesarean delivery (2/10) 12/17/2013  . Indication for care in labor or delivery 11/30/2013  . Preexisting diabetes complicating pregnancy in second trimester, antepartum 08/18/2013  . IBS (irritable bowel syndrome) 02/05/2013  . Other and unspecified hyperlipidemia 02/05/2013  . Abnormal uterine bleeding 06/18/2012  . Pelvic pain 06/18/2012  . Type II diabetes mellitus ([place]) 06/18/2012    Past Surgical History:  Procedure Laterality Date  . CERVICAL CERCLAGE    . CERVICAL CERCLAGE N/A 06/21/2013   Procedure: Linna Caprice CERVICAL;  Surgeon: Marvene Staff, MD;  Location: [facility];  Service: Gynecology;  Laterality: N/A;  . CESAREAN SECTION    . CESAREAN SECTION N/A 12/17/2013   Procedure: Repeat CESAREAN SECTION with Cerclage Removal;  Surgeon: Marvene Staff, MD;  Location: [facility];  Service: Obstetrics;  Laterality: N/A;  EDD: 12/22/13  . CHOLECYSTECTOMY    . CHOLECYSTECTOMY OPEN  08/2011  . LAPAROSCOPIC ENDOMETRIOSIS FULGURATION  01/2011     OB History  Gravida  2   Para  2   Term  2   Preterm      AB      Living  2     SAB      IAB      Ectopic      Multiple      Live Births  2           Family History  Problem Relation Age of Onset  . Diabetes Maternal Aunt   . Cancer Maternal Grandmother        breast, colon  . Diabetes Maternal Grandmother     Social History   Tobacco Use  . Smoking status: Never Smoker  . Smokeless tobacco: Never Used  Substance Use  Topics  . Alcohol use: Yes    Comment: occasionally  . Drug use: No    Home Medications Prior to Admission medications   Medication Sig Start Date End Date Taking? Authorizing Provider  Adalimumab (HUMIRA PEN) 40 MG/0.8ML PNKT Inject under the skin as directed every 14 days. 12/09/20   Tresa Garter, MD  Adalimumab 40 MG/0.8ML PNKT INJECT UNDER THE SKIN AS DIRECTED EVERY 14 DAYS. Patient not taking: Reported on 03/09/2021 12/09/20 12/09/21  Tresa Garter, MD  Adalimumab 40 MG/0.8ML PNKT INJECT 40 MG INTO THE SKIN EVERY 14 (FOURTEEN) DAYS. Patient not taking: Reported on 03/09/2021 06/22/20 06/22/21  Tresa Garter, MD  albuterol (PROVENTIL HFA;VENTOLIN HFA) 108 (90 Base) MCG/ACT inhaler Inhale 1-2 puffs into the lungs every 6 (six) hours as needed for wheezing or shortness of breath. Patient not taking: Reported on 03/09/2021    [provider]  albuterol (VENTOLIN HFA) 108 (90 Base) MCG/ACT inhaler INHALE 1 PUFFS BY MOUTH INTO THE LUNGS EVERY 4 HOURS AS NEEDED 09/01/20 09/01/21  Seward Carol, MD  atorvastatin (LIPITOR) 10 MG tablet Take 10 mg by mouth daily. Patient not taking: Reported on 03/09/2021 12/02/19   [provider]  atorvastatin (LIPITOR) 10 MG tablet TAKE 1 TABLET BY MOUTH EVERY EVENING AT BEDTIME 12/07/20 12/07/21  Delrae Rend, MD  Bacillus Coagulans-Inulin (PROBIOTIC) 1-250 BILLION-MG CAPS Take 1 capsule by mouth daily.    [provider]  Biotin 10000 MCG TABS Take 1 tablet by mouth daily.    [provider]  ciprofloxacin (CIPRO) 500 MG tablet Take 1 tablet (500 mg total) by mouth 2 (two) times daily. 03/09/21   Alroy Bailiff, Marg[place], PA-C  ciprofloxacin (CIPRO) 500 MG tablet Take 1 tablet (500 mg total) by mouth 2 (two) times daily. 03/09/21   Eustaquio Maize, PA-C  Continuous Blood Gluc Receiver (DEXCOM G6 RECEIVER) DEVI USE AS DIRECTED 01/06/21 01/06/22  Delrae Rend, MD  Continuous Blood Gluc Sensor (DEXCOM G6 SENSOR) MISC CHANGE SENSOR EVERY  10 DAYS 12/18/20 12/18/21  Delrae Rend, MD  Continuous Blood Gluc Sensor (FREESTYLE LIBRE 14 DAY SENSOR) MISC APPLY TO THE BACK OF THE UPPER ARM EVERY 14 DAYS 03/23/20   [provider]  Continuous Blood Gluc Sensor (FREESTYLE LIBRE 2 SENSOR) MISC CHANGE SENSOR EVERY 14 DAYS 04/07/20 04/07/21  Delrae Rend, MD  Continuous Blood Gluc Transmit (DEXCOM G6 TRANSMITTER) MISC CHANGE TRANSMITTER ONCE EVERY 90 DAYS 12/18/20 12/18/21  Delrae Rend, MD  dapagliflozin propanediol (FARXIGA) 10 MG TABS tablet TAKE 1 TABLET BY MOUTH ONCE DAILY 04/28/20 04/28/21  Delrae Rend, MD  Dulaglutide 0.75 MG/0.5ML SOPN INJECT 0.75MG UNDER THE SKIN ONCE A WEEK Patient not taking: Reported on 03/09/2021 12/18/20 12/18/21  Delrae Rend, MD  Dulaglutide 1.5  MG/0.5ML SOPN INJECT 1 PEN UNDER THE SKIN ONCE A WEEK. Patient not taking: Reported on 03/09/2021 01/26/21 01/26/22  Delrae Rend, MD  escitalopram (LEXAPRO) 20 MG tablet TAKE 2 TABLETS BY MOUTH ONCE A DAY. 05/19/20 05/19/21  Seward Carol, MD  escitalopram (LEXAPRO) 5 MG tablet Take 1 tablet by mouth 3 (three) times daily. Patient not taking: No sig reported 09/18/19   [provider]  FARXIGA 5 MG TABS tablet Take 10 mg by mouth daily. 04/07/20   [provider]  fluconazole (DIFLUCAN) 150 MG tablet TAKE 1 TABLET BY MOUTH FOR 1 DOSE AS DIRECTED Patient not taking: No sig reported 09/29/20 09/29/21  Servando Salina, MD  fluconazole (DIFLUCAN) 150 MG tablet TAKE 1 TABLET BY MOUTH EVERY OTHER DAY Patient not taking: Reported on 03/09/2021 09/11/20 09/11/21  Seward Carol, MD  gabapentin (NEURONTIN) 300 MG capsule Take 1 capsule by mouth 3 (three) times daily. Patient not taking: No sig reported 10/18/19   [provider]  HUMULIN R U-500 KWIKPEN 500 UNIT/ML kwikpen Inject into the skin See admin instructions. Inject 90 at lunch and 50 at dinner Patient not taking: No sig reported 01/22/20   [provider]  hydrOXYzine (ATARAX/VISTARIL) 25  MG tablet Take 1 tablet (25 mg total) by mouth every 8 (eight) hours as needed. Patient not taking: Reported on 03/09/2021 05/01/20   Wurst, [place], PA-C  hyoscyamine (LEVBID) 0.375 MG 12 hr tablet Take 1 tablet (0.375 mg total) by mouth daily as needed. 02/22/21   Ronnette Juniper, MD  hyoscyamine (LEVSIN, ANASPAZ) 0.125 MG tablet Take 1-2 tablets (0.125-0.25 mg total) by mouth every 4 (four) hours as needed. Patient not taking: No sig reported 02/15/17   Evalee Jefferson, PA-C  ibuprofen (ADVIL) 600 MG tablet Take 1 tablet (600 mg total) by mouth every 6 (six) hours as needed. Patient not taking: No sig reported 10/07/19   Melynda Ripple, MD  Insulin Pen Needle 32G X 4 MM MISC by Does not apply route. BD U/F Pen Needles Nano 4 mm x 32 G; use with insulin    [provider]  insulin regular human CONCENTRATED (HUMULIN R) 500 UNIT/ML kwikpen INJECT 100 U UNDER THE SKIN BEFORE BREAKFAST, 70 UNITS BEFORE LUNCH AND 50 UNITS BEFORE EVENING MEAL. MUST HAVE OFFICE VISIT BEFORE NEXT REFILL Patient taking differently: Inject 90 before lunch and 50 every evening 09/22/20 09/22/21  Delrae Rend, MD  LORazepam (ATIVAN) 1 MG tablet Take 0.5 mg by mouth daily as needed. Patient not taking: Reported on 03/09/2021 03/23/20   [provider]  LORazepam (ATIVAN) 1 MG tablet TAKE 1/2 TABLET BY MOUTH DAILY AS NEEDED FOR 30 DAYS 01/04/21 07/03/21  Seward Carol, MD  losartan (COZAAR) 50 MG tablet Take 50 mg by mouth daily. 10/18/19   [provider]  losartan (COZAAR) 50 MG tablet TAKE 1 TABLET BY MOUTH ONCE DAILY 04/24/20 05/17/21  Seward Carol, MD  medroxyPROGESTERone (DEPO-PROVERA) 150 MG/ML injection Inject 150 mg into the muscle every 3 (three) months.    [provider]  medroxyPROGESTERone Acetate 150 MG/ML SUSY INJECT 1 ML INTO THE MUSCLE EVERY 3 MONTH Patient not taking: Reported on 03/09/2021 09/17/20 09/17/21  Servando Salina, MD  medroxyPROGESTERone Acetate 150 MG/ML SUSY INJECT 1  ML EVERY 11-12 WEEKS Patient not taking: Reported on 03/09/2021 05/19/20 05/19/21  Servando Salina, MD  Melatonin 10 MG CAPS Take 1 capsule by mouth daily.    [provider]  nystatin-triamcinolone (MYCOLOG II) cream APPLY TO THE AFFECTED AREA(S) TWO  TIMES DAILY AS DIRECTED Patient not taking: Reported on 03/09/2021 09/29/20 09/29/21  Servando Salina, MD  pantoprazole (PROTONIX) 40 MG tablet TAKE 1 TABLET BY MOUTH ONCE DAILY Patient not taking: No sig reported 12/11/20 12/11/21  Ronnette Juniper, MD  pantoprazole (PROTONIX) 40 MG tablet TAKE 1 TABLET BY MOUTH ONCE A DAY 12/07/20 12/07/21  Ronnette Juniper, MD  pantoprazole (PROTONIX) 40 MG tablet TAKE 1 TABLET BY MOUTH ONCE A DAY 12/07/20 12/07/21  Ronnette Juniper, MD  polyethylene glycol-electrolytes (NULYTELY) 420 g solution Use as directed Patient not taking: No sig reported 01/27/21   Ronnette Juniper, MD  Probiotic Product (CULTURELLE IMMUNE DEFENSE PO) Take 1 tablet by mouth daily.    [provider]  promethazine (PHENERGAN) 25 MG tablet Take 25 mg by mouth every 6 (six) hours as needed for nausea or vomiting. Patient not taking: Reported on 03/09/2021    [provider]  promethazine (PHENERGAN) 25 MG tablet TAKE 1 TABLET BY MOUTH EVERY 8 HOURS 02/22/21     spironolactone (ALDACTONE) 50 MG tablet Take 50 mg by mouth daily. Patient not taking: Reported on 03/09/2021 10/18/19   [provider]  spironolactone (ALDACTONE) 50 MG tablet TAKE 1 TABLET BY MOUTH ONCE DAILY 09/17/20 09/17/21  Servando Salina, MD  zonisamide (ZONEGRAN) 25 MG capsule TAKE 2 CAPSULES BY MOUTH DAILY Patient not taking: No sig reported 06/19/20 06/19/21  Orie Rout, MD  dicyclomine (BENTYL) 20 MG tablet Take 1 tablet (20 mg total) by mouth every 8 (eight) hours as needed for spasms (abdominal pain). 06/01/16 10/07/19  Jola Schmidt, MD    Allergies    Metronidazole and Shellfish allergy  Review of Systems   Review of Systems  Constitutional:  Positive for appetite change (decreased d/t fear of diarrhea), chills and fatigue. Negative for fever.  HENT: Negative for congestion, rhinorrhea and sore throat.   Eyes: Negative for visual disturbance.  Respiratory: Negative for shortness of breath.   Cardiovascular: Negative for chest pain.  Gastrointestinal: Positive for abdominal pain, diarrhea, nausea and rectal pain (with defecation). Negative for abdominal distention, anal bleeding, blood in stool, constipation and vomiting.  Genitourinary: Negative for decreased urine volume, difficulty urinating, dysuria, flank pain, frequency, genital sores, hematuria, pelvic pain, urgency, vaginal bleeding, vaginal discharge and vaginal pain.  Musculoskeletal: Negative for back pain and neck pain.  Skin: Negative for color change and rash.  Neurological: Negative for dizziness, syncope, light-headedness and headaches.  Psychiatric/Behavioral: Negative for confusion.    Physical Exam Updated Vital Signs BP (!) 153/96 (BP Location: Right Arm)   Pulse (!) 110   Temp 98.9 F (37.2 C) (Oral)   Resp 16   Ht 5' 7"  (1.702 m)   Wt 78.2 kg   SpO2 99%   BMI 26.99 kg/m   Physical Exam Vitals and nursing note reviewed.  Constitutional:      General: She is not in acute distress.    Appearance: She is not ill-appearing, toxic-appearing or diaphoretic.  HENT:     Head: Normocephalic.  Eyes:     General: No scleral icterus.       Right eye: No discharge.        Left eye: No discharge.  Cardiovascular:     Rate and Rhythm: Tachycardia present.     Comments: Tachycardic at rate of 110 Pulmonary:     Effort: Pulmonary effort is normal. No tachypnea, bradypnea or respiratory distress.     Breath sounds: Normal breath sounds. No stridor.  Abdominal:  General: Bowel sounds are normal. There is no distension. There are no signs of injury.     Palpations: Abdomen is soft. There is no mass or pulsatile mass.     Tenderness: There is abdominal  tenderness in the epigastric area and left upper quadrant. There is no right CVA tenderness, left CVA tenderness, guarding or rebound.     Hernia: There is no hernia in the umbilical area or ventral area.  Musculoskeletal:     Cervical back: Normal range of motion and neck supple.     Right lower leg: No swelling or tenderness. No edema.     Left lower leg: No swelling or tenderness. No edema.  Skin:    General: Skin is warm and dry.     Coloration: Skin is not jaundiced or pale.  Neurological:     General: No focal deficit present.     Mental Status: She is alert.     GCS: GCS eye subscore is 4. GCS verbal subscore is 5. GCS motor subscore is 6.  Psychiatric:        Behavior: Behavior is cooperative.     ED Results / Procedures / Treatments   Labs (all labs ordered are listed, but only abnormal results are displayed) Labs Reviewed  CBC WITH DIFFERENTIAL/PLATELET - Abnormal; Notable for the following components:      Result Value   RBC 5.66 (*)    Hemoglobin 15.5 (*)    HCT 46.6 (*)    All other components within normal limits  COMPREHENSIVE METABOLIC PANEL - Abnormal; Notable for the following components:   Sodium 133 (*)    Glucose, Bld 462 (*)    All other components within normal limits  URINALYSIS, ROUTINE W REFLEX MICROSCOPIC - Abnormal; Notable for the following components:   Color, Urine STRAW (*)    Glucose, UA >=500 (*)    All other components within normal limits  CBG MONITORING, ED - Abnormal; Notable for the following components:   Glucose-Capillary 442 (*)    All other components within normal limits  LIPASE, BLOOD  PREGNANCY, URINE    EKG None  Radiology No results found.  Procedures Procedures   Medications Ordered in ED Medications  ondansetron (ZOFRAN) injection 4 mg (4 mg Intravenous Given 03/14/21 1916)  sodium chloride 0.9 % bolus 1,000 mL (0 mLs Intravenous Stopped 03/14/21 2015)  dicyclomine (BENTYL) injection 20 mg (20 mg Intramuscular Given  03/14/21 2038)    ED Course  I have reviewed the triage vital signs and the nursing notes.  Pertinent labs & imaging results that were available during my care of the patient were reviewed by me and considered in my medical decision making (see chart for details).    MDM Rules/Calculators/A&P                          Alert 45 year old female no acute distress, nontoxic-appearing.  Patient presents with chief complaint of nausea, diarrhea, and abdominal pain.  Patient was seen on 5/3 where CT abdomen pelvis was performed which showed changes consistent with colitis.  Patient was started on ciprofloxacin.  Patient presents today with continued nausea, diarrhea and abdominal pain.  Patient reports that she has been taking Cipro medication on empty stomach.  Suspect this is causing worsening GI discomfort.  Physical exam patient's abdomen soft, nondistended, minimal tenderness to epigastric and left upper quadrant.  No guarding, rebound tenderness, mass, pulsatile mass, ventral hernia, umbilical hernia, CVA tenderness.  Patient is afebrile.  Patient noted to be tachycardic at rate of 110.  Will give patient 1 L fluid bolus and Zofran.  Will order CMP, CBC, POC CBG, lipase, urinalysis, urine pregnancy test.  POC CBG elevated at 442.  Patient has history of diabetes.  Patient reports that the last few days she has not been taking her insulin medication as prescribed.  CMP shows no signs of DKA with anion gap and bicarb within normal limits.  Sodium slightly decreased at 133.  CBC shows no leukocytosis, hemoconcentration noted likely secondary to dehydration. Lipase within normal limits low suspicion for acute pancreatitis. UA shows no signs of infection. Urine pregnancy test negative.  On serial reexamination patient abdomen remains soft, nondistended.  Patient reports resolution of her nausea after receiving Zofran.  Patient has no episodes of vomiting while in the emergency department.   Patient was given Bentyl and reports resolution of her abdominal pain.  Patient pulse rate and blood pressure improved.  Will discharge patient with prescription for Zofran and Bentyl.  Patient advised to complete antibiotic therapy.  Patient to follow-up with primary care provider if symptoms do not improve.  Patient given strict instructions about taking insulin and Farxiga as prescribed.  Patient told to closely monitor blood sugar.  Discussed results, findings, treatment and follow up. Patient advised of return precautions. Patient verbalized understanding and agreed with plan.   Final Clinical Impression(s) / ED Diagnoses Final diagnoses:  Colitis    Rx / DC Orders ED Discharge Orders         Ordered    ondansetron (ZOFRAN ODT) 4 MG disintegrating tablet  Every 8 hours PRN        03/14/21 2113    dicyclomine (BENTYL) 20 MG tablet  2 times daily        03/14/21 2113           Loni Beckwith, PA-C 03/15/21 0051    Noemi Chapel, MD 03/16/21 1539

## 2021-03-14 NOTE — Discharge Instructions (Addendum)
You came to the emerge department today to be evaluated for your nausea and abdominal pain.  Your physical exam and lab work were reassuring.  Your symptoms are likely due to your colitis.  Please continue to take your antibiotics.  Take this medication with food to help relieve GI upset.  I have given you a prescription for Zofran.  You may take this medication once every 8 hours to help with nausea or vomiting.  Please do not combine this medication with Phenergan.  I have also given you a prescription for Bentyl.  You may take this twice a day as needed for abdominal discomfort. If your symptoms do not improve after completing antibiotics please follow-up with your primary care provider.  Your glucose was elevated upon your visit today.  Please resume your insulin and other diabetic medications.  Please watch your sugar over the next few days to make sure it returns to normal.  Get help right away if: You have a fever that does not go away with treatment. You develop chills. You have extreme weakness, fainting, or dehydration. You vomit repeatedly. You develop severe pain in your abdomen. You pass bloody or tarry stool.

## 2021-03-14 NOTE — ED Triage Notes (Signed)
Pt seen 5 days ago for same. Reports watery diarrhea and nausea but denies vomiting. States it has gotten worse. Received dx of Colitis when last seen.

## 2021-03-15 ENCOUNTER — Other Ambulatory Visit (HOSPITAL_COMMUNITY): Payer: Self-pay

## 2021-03-15 MED ORDER — DICYCLOMINE HCL 20 MG PO TABS
20.0000 mg | ORAL_TABLET | Freq: Two times a day (BID) | ORAL | 0 refills | Status: DC
  Filled 2021-03-15: qty 20, 10d supply, fill #0

## 2021-03-15 MED ORDER — ONDANSETRON 4 MG PO TBDP
4.0000 mg | ORAL_TABLET | Freq: Three times a day (TID) | ORAL | 0 refills | Status: DC | PRN
  Filled 2021-03-15: qty 20, 7d supply, fill #0

## 2021-03-16 DIAGNOSIS — R197 Diarrhea, unspecified: Secondary | ICD-10-CM | POA: Diagnosis not present

## 2021-03-17 DIAGNOSIS — K5010 Crohn's disease of large intestine without complications: Secondary | ICD-10-CM | POA: Diagnosis not present

## 2021-03-17 DIAGNOSIS — K5150 Left sided colitis without complications: Secondary | ICD-10-CM | POA: Diagnosis not present

## 2021-03-26 ENCOUNTER — Other Ambulatory Visit (HOSPITAL_COMMUNITY): Payer: Self-pay

## 2021-03-26 MED ORDER — CILIDINIUM-CHLORDIAZEPOXIDE 2.5-5 MG PO CAPS
ORAL_CAPSULE | ORAL | 1 refills | Status: DC
  Filled 2021-03-26: qty 90, 30d supply, fill #0

## 2021-03-27 ENCOUNTER — Other Ambulatory Visit (HOSPITAL_COMMUNITY): Payer: Self-pay

## 2021-03-29 ENCOUNTER — Other Ambulatory Visit (HOSPITAL_COMMUNITY): Payer: Self-pay

## 2021-04-07 ENCOUNTER — Other Ambulatory Visit (HOSPITAL_COMMUNITY): Payer: Self-pay

## 2021-04-09 ENCOUNTER — Other Ambulatory Visit (HOSPITAL_COMMUNITY): Payer: Self-pay

## 2021-04-09 MED FILL — Adalimumab Auto-injector Kit 40 MG/0.8ML: SUBCUTANEOUS | 28 days supply | Qty: 2 | Fill #2 | Status: AC

## 2021-04-13 MED FILL — Dapagliflozin Propanediol Tab 10 MG (Base Equivalent): ORAL | 30 days supply | Qty: 30 | Fill #1 | Status: AC

## 2021-04-13 MED FILL — Spironolactone Tab 50 MG: ORAL | 30 days supply | Qty: 30 | Fill #0 | Status: AC

## 2021-04-14 ENCOUNTER — Other Ambulatory Visit (HOSPITAL_COMMUNITY): Payer: Self-pay

## 2021-04-14 DIAGNOSIS — K50118 Crohn's disease of large intestine with other complication: Secondary | ICD-10-CM | POA: Diagnosis not present

## 2021-04-14 DIAGNOSIS — K580 Irritable bowel syndrome with diarrhea: Secondary | ICD-10-CM | POA: Diagnosis not present

## 2021-04-14 DIAGNOSIS — R1084 Generalized abdominal pain: Secondary | ICD-10-CM | POA: Diagnosis not present

## 2021-04-14 MED ORDER — ALOSETRON HCL 0.5 MG PO TABS
ORAL_TABLET | ORAL | 1 refills | Status: DC
  Filled 2021-04-14: qty 14, 7d supply, fill #0

## 2021-04-15 ENCOUNTER — Other Ambulatory Visit (HOSPITAL_COMMUNITY): Payer: Self-pay

## 2021-04-16 ENCOUNTER — Other Ambulatory Visit (HOSPITAL_COMMUNITY): Payer: Self-pay

## 2021-04-28 ENCOUNTER — Other Ambulatory Visit (HOSPITAL_COMMUNITY): Payer: Self-pay

## 2021-04-28 MED FILL — Continuous Glucose System Transmitter: 90 days supply | Qty: 1 | Fill #0 | Status: AC

## 2021-04-28 MED FILL — Continuous Glucose System Sensor: 30 days supply | Qty: 3 | Fill #0 | Status: AC

## 2021-04-29 ENCOUNTER — Other Ambulatory Visit (HOSPITAL_COMMUNITY): Payer: Self-pay

## 2021-04-29 DIAGNOSIS — Z794 Long term (current) use of insulin: Secondary | ICD-10-CM | POA: Diagnosis not present

## 2021-04-29 DIAGNOSIS — F418 Other specified anxiety disorders: Secondary | ICD-10-CM | POA: Diagnosis not present

## 2021-04-29 DIAGNOSIS — Z0000 Encounter for general adult medical examination without abnormal findings: Secondary | ICD-10-CM | POA: Diagnosis not present

## 2021-04-29 DIAGNOSIS — K50118 Crohn's disease of large intestine with other complication: Secondary | ICD-10-CM | POA: Diagnosis not present

## 2021-04-29 DIAGNOSIS — E7800 Pure hypercholesterolemia, unspecified: Secondary | ICD-10-CM | POA: Diagnosis not present

## 2021-04-29 DIAGNOSIS — E559 Vitamin D deficiency, unspecified: Secondary | ICD-10-CM | POA: Diagnosis not present

## 2021-04-29 DIAGNOSIS — I10 Essential (primary) hypertension: Secondary | ICD-10-CM | POA: Diagnosis not present

## 2021-04-29 DIAGNOSIS — E1165 Type 2 diabetes mellitus with hyperglycemia: Secondary | ICD-10-CM | POA: Diagnosis not present

## 2021-04-29 MED ORDER — COLESTIPOL HCL 1 G PO TABS
ORAL_TABLET | ORAL | 1 refills | Status: DC
  Filled 2021-04-29: qty 28, 14d supply, fill #0

## 2021-05-04 ENCOUNTER — Other Ambulatory Visit (HOSPITAL_COMMUNITY): Payer: Self-pay

## 2021-05-04 MED FILL — Medroxyprogesterone Acetate IM Susp Prefilled Syr 150 MG/ML: INTRAMUSCULAR | 84 days supply | Qty: 1 | Fill #1 | Status: AC

## 2021-05-05 ENCOUNTER — Other Ambulatory Visit (HOSPITAL_COMMUNITY): Payer: Self-pay

## 2021-05-06 ENCOUNTER — Other Ambulatory Visit (HOSPITAL_COMMUNITY): Payer: Self-pay

## 2021-05-06 DIAGNOSIS — Z3042 Encounter for surveillance of injectable contraceptive: Secondary | ICD-10-CM | POA: Diagnosis not present

## 2021-05-06 DIAGNOSIS — K50818 Crohn's disease of both small and large intestine with other complication: Secondary | ICD-10-CM | POA: Diagnosis not present

## 2021-05-11 ENCOUNTER — Other Ambulatory Visit (HOSPITAL_COMMUNITY): Payer: Self-pay

## 2021-05-11 MED FILL — Adalimumab Auto-injector Kit 40 MG/0.8ML: SUBCUTANEOUS | 28 days supply | Qty: 2 | Fill #3 | Status: AC

## 2021-05-13 ENCOUNTER — Other Ambulatory Visit (HOSPITAL_COMMUNITY): Payer: Self-pay

## 2021-05-13 DIAGNOSIS — Z794 Long term (current) use of insulin: Secondary | ICD-10-CM | POA: Diagnosis not present

## 2021-05-13 DIAGNOSIS — E1165 Type 2 diabetes mellitus with hyperglycemia: Secondary | ICD-10-CM | POA: Diagnosis not present

## 2021-05-13 DIAGNOSIS — R197 Diarrhea, unspecified: Secondary | ICD-10-CM | POA: Diagnosis not present

## 2021-05-13 DIAGNOSIS — I10 Essential (primary) hypertension: Secondary | ICD-10-CM | POA: Diagnosis not present

## 2021-05-24 DIAGNOSIS — Z3042 Encounter for surveillance of injectable contraceptive: Secondary | ICD-10-CM | POA: Diagnosis not present

## 2021-05-24 DIAGNOSIS — E119 Type 2 diabetes mellitus without complications: Secondary | ICD-10-CM | POA: Diagnosis not present

## 2021-05-24 DIAGNOSIS — E282 Polycystic ovarian syndrome: Secondary | ICD-10-CM | POA: Diagnosis not present

## 2021-05-24 DIAGNOSIS — K5090 Crohn's disease, unspecified, without complications: Secondary | ICD-10-CM | POA: Diagnosis not present

## 2021-05-24 DIAGNOSIS — F419 Anxiety disorder, unspecified: Secondary | ICD-10-CM | POA: Diagnosis not present

## 2021-05-24 DIAGNOSIS — Z01419 Encounter for gynecological examination (general) (routine) without abnormal findings: Secondary | ICD-10-CM | POA: Diagnosis not present

## 2021-05-25 ENCOUNTER — Other Ambulatory Visit (HOSPITAL_COMMUNITY): Payer: Self-pay

## 2021-05-25 MED ORDER — OZEMPIC (0.25 OR 0.5 MG/DOSE) 2 MG/1.5ML ~~LOC~~ SOPN
PEN_INJECTOR | SUBCUTANEOUS | 11 refills | Status: DC
  Filled 2021-05-25: qty 1.5, 42d supply, fill #0
  Filled 2021-08-19: qty 1.5, 28d supply, fill #1
  Filled 2021-09-18: qty 1.5, 28d supply, fill #2
  Filled 2021-11-23: qty 1.5, 28d supply, fill #3
  Filled 2022-01-16: qty 1.5, 28d supply, fill #4
  Filled 2022-02-13: qty 1.5, 28d supply, fill #5

## 2021-05-25 MED FILL — Lorazepam Tab 1 MG: ORAL | 40 days supply | Qty: 20 | Fill #0 | Status: AC

## 2021-05-26 ENCOUNTER — Ambulatory Visit (HOSPITAL_COMMUNITY): Payer: 59

## 2021-05-26 ENCOUNTER — Other Ambulatory Visit (HOSPITAL_COMMUNITY): Payer: Self-pay

## 2021-05-26 MED ORDER — HUMULIN R U-500 KWIKPEN 500 UNIT/ML ~~LOC~~ SOPN
PEN_INJECTOR | SUBCUTANEOUS | 3 refills | Status: DC
  Filled 2021-05-26: qty 24, 90d supply, fill #0

## 2021-05-28 ENCOUNTER — Other Ambulatory Visit (HOSPITAL_COMMUNITY): Payer: Self-pay

## 2021-05-29 ENCOUNTER — Other Ambulatory Visit (HOSPITAL_COMMUNITY): Payer: Self-pay

## 2021-06-03 ENCOUNTER — Other Ambulatory Visit (HOSPITAL_COMMUNITY): Payer: Self-pay | Admitting: *Deleted

## 2021-06-03 ENCOUNTER — Other Ambulatory Visit (HOSPITAL_COMMUNITY): Payer: Self-pay

## 2021-06-04 ENCOUNTER — Encounter (HOSPITAL_COMMUNITY)
Admission: RE | Admit: 2021-06-04 | Discharge: 2021-06-04 | Disposition: A | Discharge status: Home / Self Care | Payer: 59 | Source: Ambulatory Visit | Attending: Gastroenterology | Admitting: Gastroenterology

## 2021-06-04 ENCOUNTER — Other Ambulatory Visit: Payer: Self-pay

## 2021-06-04 DIAGNOSIS — K50818 Crohn's disease of both small and large intestine with other complication: Secondary | ICD-10-CM | POA: Insufficient documentation

## 2021-06-04 MED ORDER — DIPHENHYDRAMINE HCL 25 MG PO CAPS
ORAL_CAPSULE | ORAL | Status: AC
  Administered 2021-06-04: 50 mg via ORAL
  Filled 2021-06-04: qty 2

## 2021-06-04 MED ORDER — DIPHENHYDRAMINE HCL 25 MG PO CAPS: 50.0000 mg | ORAL_CAPSULE | Freq: Once | ORAL | Status: DC

## 2021-06-04 MED ORDER — ACETAMINOPHEN 325 MG PO TABS: 650.0000 mg | ORAL_TABLET | Freq: Once | ORAL | Status: DC

## 2021-06-04 MED ORDER — SODIUM CHLORIDE 0.9 % IV SOLN
5.0000 mg/kg | Freq: Once | INTRAVENOUS | Status: DC
  Administered 2021-06-04: 400 mg via INTRAVENOUS
  Filled 2021-06-04: qty 40

## 2021-06-04 MED ORDER — ACETAMINOPHEN 325 MG PO TABS
ORAL_TABLET | ORAL | Status: AC
  Administered 2021-06-04: 650 mg via ORAL
  Filled 2021-06-04: qty 2

## 2021-06-15 ENCOUNTER — Other Ambulatory Visit (HOSPITAL_COMMUNITY): Payer: Self-pay

## 2021-06-18 ENCOUNTER — Other Ambulatory Visit: Payer: Self-pay

## 2021-06-18 ENCOUNTER — Encounter (HOSPITAL_COMMUNITY)
Admission: RE | Admit: 2021-06-18 | Discharge: 2021-06-18 | Disposition: A | Discharge status: Home / Self Care | Payer: 59 | Source: Ambulatory Visit | Attending: Gastroenterology | Admitting: Gastroenterology

## 2021-06-18 DIAGNOSIS — K50818 Crohn's disease of both small and large intestine with other complication: Secondary | ICD-10-CM | POA: Diagnosis not present

## 2021-06-18 MED ORDER — DIPHENHYDRAMINE HCL 25 MG PO CAPS
ORAL_CAPSULE | ORAL | Status: AC
  Filled 2021-06-18: qty 2

## 2021-06-18 MED ORDER — SODIUM CHLORIDE 0.9 % IV SOLN
5.0000 mg/kg | Freq: Once | INTRAVENOUS | Status: DC
  Administered 2021-06-18: 400 mg via INTRAVENOUS
  Filled 2021-06-18: qty 40

## 2021-06-18 MED ORDER — DIPHENHYDRAMINE HCL 25 MG PO CAPS: 50.0000 mg | ORAL_CAPSULE | Freq: Once | ORAL | Status: DC

## 2021-06-18 MED ORDER — ACETAMINOPHEN 325 MG PO TABS: 650.0000 mg | ORAL_TABLET | Freq: Once | ORAL | Status: DC

## 2021-06-18 MED ORDER — ACETAMINOPHEN 325 MG PO TABS
ORAL_TABLET | ORAL | Status: AC
  Filled 2021-06-18: qty 2

## 2021-06-21 DIAGNOSIS — R928 Other abnormal and inconclusive findings on diagnostic imaging of breast: Secondary | ICD-10-CM | POA: Diagnosis not present

## 2021-06-21 DIAGNOSIS — N6320 Unspecified lump in the left breast, unspecified quadrant: Secondary | ICD-10-CM | POA: Diagnosis not present

## 2021-06-21 LAB — HM MAMMOGRAPHY

## 2021-07-10 ENCOUNTER — Other Ambulatory Visit (HOSPITAL_COMMUNITY): Payer: Self-pay

## 2021-07-10 MED FILL — Spironolactone Tab 50 MG: ORAL | 30 days supply | Qty: 30 | Fill #1 | Status: AC

## 2021-07-13 ENCOUNTER — Other Ambulatory Visit (HOSPITAL_COMMUNITY): Payer: Self-pay

## 2021-07-13 MED ORDER — MEDROXYPROGESTERONE ACETATE 150 MG/ML IM SUSY
PREFILLED_SYRINGE | INTRAMUSCULAR | 3 refills | Status: DC
  Filled 2021-07-13 – 2021-07-23 (×2): qty 1, 84d supply, fill #0
  Filled 2021-10-19: qty 1, 84d supply, fill #1

## 2021-07-13 MED ORDER — FARXIGA 10 MG PO TABS
ORAL_TABLET | ORAL | 5 refills | Status: DC
  Filled 2021-07-13: qty 30, 30d supply, fill #0
  Filled 2021-09-18: qty 30, 30d supply, fill #1
  Filled 2021-12-27: qty 30, 30d supply, fill #2
  Filled 2022-03-09: qty 30, 30d supply, fill #3
  Filled 2022-05-08 – 2022-05-26 (×2): qty 30, 30d supply, fill #4
  Filled 2022-06-27: qty 30, 30d supply, fill #5

## 2021-07-14 ENCOUNTER — Other Ambulatory Visit (HOSPITAL_COMMUNITY): Payer: Self-pay

## 2021-07-14 DIAGNOSIS — K5090 Crohn's disease, unspecified, without complications: Secondary | ICD-10-CM | POA: Diagnosis not present

## 2021-07-14 MED ORDER — ESCITALOPRAM OXALATE 20 MG PO TABS
ORAL_TABLET | ORAL | 3 refills | Status: DC
  Filled 2021-07-14: qty 180, 90d supply, fill #0
  Filled 2021-07-28: qty 60, 30d supply, fill #0
  Filled ????-??-??: fill #0

## 2021-07-14 MED ORDER — ESCITALOPRAM OXALATE 20 MG PO TABS
ORAL_TABLET | ORAL | 0 refills | Status: DC
  Filled 2021-07-14: qty 30, 30d supply, fill #0

## 2021-07-14 NOTE — Progress Notes (Signed)
Cindy Long Phone: [phone] Subjective:   Cindy Long, am serving as a scribe for Dr. Hulan Long. This visit occurred during the SARS-CoV-2 public health emergency.  Safety protocols were in place, including screening questions prior to the visit, additional usage of Long PPE, and extensive cleaning of exam room while observing appropriate contact time as indicated for disinfecting solutions.   I'm seeing this patient by the request  of:  Cindy Carol, MD  CC: Right knee pain  [id number]  Cindy Long is a 45 y.o. female coming in with complaint of B knee pain. R>L surround entire PF joint. Saw Dr. Georgina Long for R knee pain in 2021. Patient states she fell directly on knee 2x in past year. Patient uses body helix, Voltaren gel and biofreeze for pain relief. Patient notes with standing, walking and especially stairs.      Past Medical History:  Diagnosis Date   Crohn disease ([place])    Diabetes mellitus    IDDM   GERD (gastroesophageal reflux disease)    Long meds currently   High cholesterol    Hypertension    Mastitis    right breast   Postpartum care following cesarean delivery (2/10) 12/17/2013   Past Surgical History:  Procedure Laterality Date   CERVICAL CERCLAGE     CERVICAL CERCLAGE N/A 06/21/2013   Procedure: [place] CERCLAGE CERVICAL;  Surgeon: Cindy Staff, MD;  Location: [facility];  Service: Gynecology;  Laterality: N/A;   CESAREAN SECTION     CESAREAN SECTION N/A 12/17/2013   Procedure: Repeat CESAREAN SECTION with Cerclage Removal;  Surgeon: Cindy Staff, MD;  Location: [facility];  Service: Obstetrics;  Laterality: N/A;  EDD: 12/22/13   CHOLECYSTECTOMY     CHOLECYSTECTOMY OPEN  08/2011   LAPAROSCOPIC ENDOMETRIOSIS FULGURATION  01/2011   Social History   Socioeconomic History   Marital status: Married    Spouse name: Not on file   Number of children: Not on file    Years of education: Not on file   Highest education level: Not on file  Occupational History   Not on file  Tobacco Use   Smoking status: Never   Smokeless tobacco: Never  Substance and Sexual Activity   Alcohol use: Yes    Comment: occasionally   Drug use: Long   Sexual activity: Not on file  Other Topics Concern   Not on file  Social History Narrative   Not on file   Social Determinants of Health   Financial Resource Strain: Not on file  Food Insecurity: Not on file  Transportation Needs: Not on file  Physical Activity: Not on file  Stress: Not on file  Social Connections: Not on file   Allergies  Allergen Reactions   Metronidazole Swelling    Throat swelling   Shellfish Allergy Swelling    Swelling of the throat   Family History  Problem Relation Age of Onset   Diabetes Maternal Aunt    Cancer Maternal Grandmother        breast, colon   Diabetes Maternal Grandmother     Current Outpatient Medications (Endocrine & Metabolic):    dapagliflozin propanediol (FARXIGA) 10 MG TABS tablet, Take 1 tablet by mouth once a day   Dulaglutide 0.75 MG/0.5ML SOPN, INJECT 0.75MG UNDER THE SKIN ONCE A WEEK   Dulaglutide 1.5 MG/0.5ML SOPN, INJECT 1 PEN UNDER THE SKIN ONCE A WEEK.   FARXIGA 5 MG TABS  tablet, Take 10 mg by mouth daily.   HUMULIN R U-500 KWIKPEN 500 UNIT/ML kwikpen, Inject into the skin See admin instructions. Inject 90 at lunch and 50 at dinner   insulin regular human CONCENTRATED (HUMULIN R U-500 KWIKPEN) 500 UNIT/ML kwikpen, Inject 90 units under the skin at lunch and 40 units at evening meal   insulin regular human CONCENTRATED (HUMULIN R) 500 UNIT/ML kwikpen, INJECT 100 U UNDER THE SKIN BEFORE BREAKFAST, 70 UNITS BEFORE LUNCH AND 50 UNITS BEFORE EVENING MEAL. MUST HAVE OFFICE VISIT BEFORE NEXT REFILL (Patient taking differently: Inject 90 before lunch and 50 every evening)   medroxyPROGESTERone (DEPO-PROVERA) 150 MG/ML injection, Inject 150 mg into the muscle every 3  (three) months.   medroxyPROGESTERone Acetate (DEPO-PROVERA) 150 MG/ML SUSY, Inject into the muscle every 3 months   medroxyPROGESTERone Acetate 150 MG/ML SUSY, INJECT 1 ML INTO THE MUSCLE EVERY 3 MONTH   Semaglutide,0.25 or 0.5MG/DOS, (OZEMPIC, 0.25 OR 0.5 MG/DOSE,) 2 MG/1.5ML SOPN, Inject 0.25 mg once a week for 4 weeks then increase to 0.5 mg once a week subcutaneously   medroxyPROGESTERone Acetate 150 MG/ML SUSY, INJECT 1 ML EVERY 11-12 WEEKS (Patient not taking: Reported on 03/09/2021)  Current Outpatient Medications (Cardiovascular):    atorvastatin (LIPITOR) 10 MG tablet, Take 10 mg by mouth daily.   atorvastatin (LIPITOR) 10 MG tablet, TAKE 1 TABLET BY MOUTH EVERY EVENING AT BEDTIME   colestipol (COLESTID) 1 g tablet, Take 2 tablets by mouth once daily for 14 days   losartan (COZAAR) 50 MG tablet, Take 50 mg by mouth daily.   spironolactone (ALDACTONE) 50 MG tablet, Take 50 mg by mouth daily.   spironolactone (ALDACTONE) 50 MG tablet, TAKE 1 TABLET BY MOUTH ONCE DAILY   losartan (COZAAR) 50 MG tablet, TAKE 1 TABLET BY MOUTH ONCE DAILY  Current Outpatient Medications (Respiratory):    albuterol (PROVENTIL HFA;VENTOLIN HFA) 108 (90 Base) MCG/ACT inhaler, Inhale 1-2 puffs into the lungs every 6 (six) hours as needed for wheezing or shortness of breath.   albuterol (VENTOLIN HFA) 108 (90 Base) MCG/ACT inhaler, INHALE 1 PUFFS BY MOUTH INTO THE LUNGS EVERY 4 HOURS AS NEEDED   promethazine (PHENERGAN) 25 MG tablet, Take 25 mg by mouth every 6 (six) hours as needed for nausea or vomiting.   promethazine (PHENERGAN) 25 MG tablet, TAKE 1 TABLET BY MOUTH EVERY 8 HOURS  Current Outpatient Medications (Analgesics):    Adalimumab (HUMIRA PEN) 40 MG/0.8ML PNKT, Inject under the skin as directed every 14 days.   Adalimumab 40 MG/0.8ML PNKT, INJECT UNDER THE SKIN AS DIRECTED EVERY 14 DAYS.   ibuprofen (ADVIL) 600 MG tablet, Take 1 tablet (600 mg total) by mouth every 6 (six) hours as  needed.   Current Outpatient Medications (Other):    alosetron (LOTRONEX) 0.5 MG tablet, Take 1 tablet by mouth twice daily for 7 days   Bacillus Coagulans-Inulin (PROBIOTIC) 1-250 BILLION-MG CAPS, Take 1 capsule by mouth daily.   Biotin 10000 MCG TABS, Take 1 tablet by mouth daily.   ciprofloxacin (CIPRO) 500 MG tablet, Take 1 tablet (500 mg total) by mouth 2 (two) times daily.   clidinium-chlordiazePOXIDE (LIBRAX) 5-2.5 MG capsule, Take 1 capsule 3 times a day before meals as needed   Continuous Blood Gluc Receiver (DEXCOM G6 RECEIVER) DEVI, USE AS DIRECTED   Continuous Blood Gluc Sensor (DEXCOM G6 SENSOR) MISC, CHANGE SENSOR EVERY 10 DAYS   Continuous Blood Gluc Sensor (FREESTYLE LIBRE 14 DAY SENSOR) MISC, APPLY TO THE BACK OF THE UPPER ARM  EVERY 14 DAYS   Continuous Blood Gluc Transmit (DEXCOM G6 TRANSMITTER) MISC, CHANGE TRANSMITTER ONCE EVERY 90 DAYS   dicyclomine (BENTYL) 20 MG tablet, Take 1 tablet (20 mg total) by mouth 2 (two) times daily.   dicyclomine (BENTYL) 20 MG tablet, Take 1 tablet (20 mg total) by mouth 2 (two) times daily.   escitalopram (LEXAPRO) 20 MG tablet, Take 2 tablets by mouth daily. Needs to contact office for more refills   escitalopram (LEXAPRO) 20 MG tablet, Take 2 tablets by mouth once a day   escitalopram (LEXAPRO) 5 MG tablet, Take 1 tablet by mouth 3 (three) times daily.   fluconazole (DIFLUCAN) 150 MG tablet, TAKE 1 TABLET BY MOUTH FOR 1 DOSE AS DIRECTED   fluconazole (DIFLUCAN) 150 MG tablet, TAKE 1 TABLET BY MOUTH EVERY OTHER DAY   gabapentin (NEURONTIN) 300 MG capsule, Take 1 capsule by mouth 3 (three) times daily.   hydrOXYzine (ATARAX/VISTARIL) 25 MG tablet, Take 1 tablet (25 mg total) by mouth every 8 (eight) hours as needed.   hyoscyamine (LEVBID) 0.375 MG 12 hr tablet, Take 1 tablet (0.375 mg total) by mouth daily as needed.   hyoscyamine (LEVSIN, ANASPAZ) 0.125 MG tablet, Take 1-2 tablets (0.125-0.25 mg total) by mouth every 4 (four) hours as  needed.   Insulin Pen Needle 32G X 4 MM MISC, by Does not apply route. BD U/F Pen Needles Nano 4 mm x 32 G; use with insulin   LORazepam (ATIVAN) 1 MG tablet, Take 0.5 mg by mouth daily as needed.   Melatonin 10 MG CAPS, Take 1 capsule by mouth daily.   nystatin-triamcinolone (MYCOLOG II) cream, APPLY TO THE AFFECTED AREA(S) TWO TIMES DAILY AS DIRECTED   ondansetron (ZOFRAN ODT) 4 MG disintegrating tablet, Take 1 tablet (4 mg total) by mouth every 8 (eight) hours as needed for nausea or vomiting.   ondansetron (ZOFRAN-ODT) 4 MG disintegrating tablet, Dissolve 1 tablet (4 mg total) by mouth every 8 (eight) hours as needed for nausea or vomiting   pantoprazole (PROTONIX) 40 MG tablet, TAKE 1 TABLET BY MOUTH ONCE DAILY   pantoprazole (PROTONIX) 40 MG tablet, TAKE 1 TABLET BY MOUTH ONCE A DAY   pantoprazole (PROTONIX) 40 MG tablet, TAKE 1 TABLET BY MOUTH ONCE A DAY   polyethylene glycol-electrolytes (NULYTELY) 420 g solution, Use as directed   Probiotic Product (CULTURELLE IMMUNE DEFENSE PO), Take 1 tablet by mouth daily.   ciprofloxacin (CIPRO) 500 MG tablet, Take 1 tablet (500 mg total) by mouth 2 (two) times daily.   escitalopram (LEXAPRO) 20 MG tablet, TAKE 2 TABLETS BY MOUTH ONCE A DAY.   zonisamide (ZONEGRAN) 25 MG capsule, TAKE 2 CAPSULES BY MOUTH DAILY (Patient not taking: Long sig reported)   Reviewed prior external information including notes and imaging from  primary care provider As well as notes that were available from care everywhere and other healthcare systems.  Past medical history, social, surgical and family history all reviewed in electronic medical record.  Long pertanent information unless stated regarding to the chief complaint.   Review of Systems:  Long headache, visual changes, nausea, vomiting, diarrhea, constipation, dizziness, abdominal pain, skin rash, fevers, chills, night sweats, weight loss, swollen lymph nodes,joint swelling, chest pain, shortness of breath, mood  changes. POSITIVE muscle aches, body aches  Objective  Blood pressure 124/82, pulse (!) 105, height 5' 7"  (1.702 m), weight 164 lb (74.4 kg), SpO2 95 %, unknown if currently breastfeeding.   General: Long apparent distress alert and oriented x3 mood  and affect normal, dressed appropriately.  HEENT: Pupils equal, extraocular movements intact  Respiratory: Patient's speak in full sentences and does not appear short of breath  Cardiovascular: Long lower extremity edema, non tender, Long erythema  Gait mild antalgic Significant atrophy of the lower extremities noted including the calfs, as well as the thighs.  Patient has significant crepitus of the knees bilaterally severely worse on the right.  Lateral tracking noted.  Positive patellar grind test.  4-5 strength of the lower extremities.  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    Impression and Recommendations:    The above documentation has been reviewed and is accurate and complete Lyndal Pulley, DO

## 2021-07-16 ENCOUNTER — Encounter: Payer: Self-pay | Admitting: Family Medicine

## 2021-07-16 ENCOUNTER — Other Ambulatory Visit: Payer: 59

## 2021-07-16 ENCOUNTER — Encounter (HOSPITAL_COMMUNITY)
Admission: RE | Admit: 2021-07-16 | Discharge: 2021-07-16 | Disposition: A | Discharge status: Home / Self Care | Payer: 59 | Source: Ambulatory Visit | Attending: Gastroenterology | Admitting: Gastroenterology

## 2021-07-16 ENCOUNTER — Other Ambulatory Visit (HOSPITAL_COMMUNITY): Payer: Self-pay

## 2021-07-16 ENCOUNTER — Ambulatory Visit: Payer: 59 | Admitting: Family Medicine

## 2021-07-16 ENCOUNTER — Ambulatory Visit: Payer: 59

## 2021-07-16 ENCOUNTER — Ambulatory Visit (INDEPENDENT_AMBULATORY_CARE_PROVIDER_SITE_OTHER): Payer: 59

## 2021-07-16 ENCOUNTER — Other Ambulatory Visit: Payer: Self-pay

## 2021-07-16 VITALS — BP 124/82 | HR 105 | Ht 67.0 in | Wt 164.0 lb

## 2021-07-16 DIAGNOSIS — M6250 Muscle wasting and atrophy, not elsewhere classified, unspecified site: Secondary | ICD-10-CM | POA: Insufficient documentation

## 2021-07-16 DIAGNOSIS — M222X1 Patellofemoral disorders, right knee: Secondary | ICD-10-CM | POA: Diagnosis not present

## 2021-07-16 DIAGNOSIS — M545 Low back pain, unspecified: Secondary | ICD-10-CM

## 2021-07-16 DIAGNOSIS — M1711 Unilateral primary osteoarthritis, right knee: Secondary | ICD-10-CM | POA: Diagnosis not present

## 2021-07-16 DIAGNOSIS — M62569 Muscle wasting and atrophy, not elsewhere classified, unspecified lower leg: Secondary | ICD-10-CM

## 2021-07-16 DIAGNOSIS — M1712 Unilateral primary osteoarthritis, left knee: Secondary | ICD-10-CM | POA: Diagnosis not present

## 2021-07-16 DIAGNOSIS — K50818 Crohn's disease of both small and large intestine with other complication: Secondary | ICD-10-CM | POA: Diagnosis not present

## 2021-07-16 DIAGNOSIS — G8929 Other chronic pain: Secondary | ICD-10-CM | POA: Diagnosis not present

## 2021-07-16 DIAGNOSIS — M2550 Pain in unspecified joint: Secondary | ICD-10-CM | POA: Diagnosis not present

## 2021-07-16 DIAGNOSIS — M25562 Pain in left knee: Secondary | ICD-10-CM

## 2021-07-16 DIAGNOSIS — M25561 Pain in right knee: Secondary | ICD-10-CM

## 2021-07-16 DIAGNOSIS — M47816 Spondylosis without myelopathy or radiculopathy, lumbar region: Secondary | ICD-10-CM | POA: Diagnosis not present

## 2021-07-16 LAB — IBC PANEL
Iron: 85 ug/dL (ref 42–145)
Saturation Ratios: 21.9 % (ref 20.0–50.0)
TIBC: 387.8 ug/dL (ref 250.0–450.0)
Transferrin: 277 mg/dL (ref 212.0–360.0)

## 2021-07-16 LAB — CK: Total CK: 45 U/L (ref 7–177)

## 2021-07-16 LAB — CBC WITH DIFFERENTIAL/PLATELET
Basophils Absolute: 0 10*3/uL (ref 0.0–0.1)
Basophils Relative: 0.3 % (ref 0.0–3.0)
Eosinophils Absolute: 0.1 10*3/uL (ref 0.0–0.7)
Eosinophils Relative: 1.2 % (ref 0.0–5.0)
HCT: 45.8 % (ref 36.0–46.0)
Hemoglobin: 15.5 g/dL — ABNORMAL HIGH (ref 12.0–15.0)
Lymphocytes Relative: 37.4 % (ref 12.0–46.0)
Lymphs Abs: 2 10*3/uL (ref 0.7–4.0)
MCHC: 33.9 g/dL (ref 30.0–36.0)
MCV: 82.3 fl (ref 78.0–100.0)
Monocytes Absolute: 0.4 10*3/uL (ref 0.1–1.0)
Monocytes Relative: 7 % (ref 3.0–12.0)
Neutro Abs: 2.8 10*3/uL (ref 1.4–7.7)
Neutrophils Relative %: 54.1 % (ref 43.0–77.0)
Platelets: 264 10*3/uL (ref 150.0–400.0)
RBC: 5.56 Mil/uL — ABNORMAL HIGH (ref 3.87–5.11)
RDW: 13.4 % (ref 11.5–15.5)
WBC: 5.2 10*3/uL (ref 4.0–10.5)

## 2021-07-16 LAB — COMPREHENSIVE METABOLIC PANEL
ALT: 17 U/L (ref 0–35)
AST: 11 U/L (ref 0–37)
Albumin: 4.3 g/dL (ref 3.5–5.2)
Alkaline Phosphatase: 74 U/L (ref 39–117)
BUN: 7 mg/dL (ref 6–23)
CO2: 22 mEq/L (ref 19–32)
Calcium: 9.6 mg/dL (ref 8.4–10.5)
Chloride: 99 mEq/L (ref 96–112)
Creatinine, Ser: 0.8 mg/dL (ref 0.40–1.20)
GFR: 90.24 mL/min (ref >60.00)
Glucose, Bld: 464 mg/dL — ABNORMAL HIGH (ref 70–99)
Potassium: 3.8 mEq/L (ref 3.5–5.1)
Sodium: 135 mEq/L (ref 135–145)
Total Bilirubin: 0.6 mg/dL (ref 0.2–1.2)
Total Protein: 7.6 g/dL (ref 6.0–8.3)

## 2021-07-16 LAB — C-REACTIVE PROTEIN: CRP: <1 mg/dL (ref 0.5–20.0)

## 2021-07-16 LAB — T3, FREE: T3, Free: 3.6 pg/mL (ref 2.3–4.2)

## 2021-07-16 LAB — TSH: TSH: 1.09 u[IU]/mL (ref 0.35–5.50)

## 2021-07-16 LAB — T4, FREE: Free T4: 0.7 ng/dL (ref 0.60–1.60)

## 2021-07-16 LAB — SEDIMENTATION RATE: Sed Rate: 39 mm/hr — ABNORMAL HIGH (ref 0–20)

## 2021-07-16 LAB — VITAMIN D 25 HYDROXY (VIT D DEFICIENCY, FRACTURES): VITD: 25.83 ng/mL — ABNORMAL LOW (ref 30.00–100.00)

## 2021-07-16 LAB — FERRITIN: Ferritin: 307.2 ng/mL — ABNORMAL HIGH (ref 10.0–291.0)

## 2021-07-16 LAB — VITAMIN B12: Vitamin B-12: 206 pg/mL — ABNORMAL LOW (ref 211–911)

## 2021-07-16 LAB — URIC ACID: Uric Acid, Serum: 5 mg/dL (ref 2.4–7.0)

## 2021-07-16 MED ORDER — ACETAMINOPHEN 325 MG PO TABS: 650.0000 mg | ORAL_TABLET | Freq: Once | ORAL | Status: DC

## 2021-07-16 MED ORDER — DIPHENHYDRAMINE HCL 25 MG PO CAPS: 50.0000 mg | ORAL_CAPSULE | Freq: Once | ORAL | Status: DC

## 2021-07-16 MED ORDER — SODIUM CHLORIDE 0.9 % IV SOLN
5.0000 mg/kg | Freq: Once | INTRAVENOUS | Status: DC
  Administered 2021-07-16: 400 mg via INTRAVENOUS
  Filled 2021-07-16: qty 40

## 2021-07-16 NOTE — Assessment & Plan Note (Signed)
Significant atrophy of the lower extremities.  We will check ABI to make sure that this is not vascular in nature.  X-rays of the back to make sure neurogenic.

## 2021-07-16 NOTE — Patient Instructions (Addendum)
Xray today   [facility]  ([facility] [employer] [facility]) [address], [address] [place], [place] [address] [phone]  Labs today  See me in 4-6 weeks

## 2021-07-16 NOTE — Assessment & Plan Note (Signed)
X-rays are pending to see how much arthritic changes are contributing.  Ultrasound did not show any significant swelling.  I do feel though the patient does have significant atrophy noted of the lower extremities.  This could be secondary to patient being somewhat difficult to control diabetes patient is also had irritable bowel and been diagnosed with potential Crohn's disease.  We discussed that with this may be some malabsorption we will start on once weekly vitamin D follow-up again 4 to 8 weeks

## 2021-07-17 LAB — HEMOGLOBIN A1C
Hgb A1c MFr Bld: 13.7 % of total Hgb — ABNORMAL HIGH (ref <5.7)
Mean Plasma Glucose: 346 mg/dL
eAG (mmol/L): 19.2 mmol/L

## 2021-07-19 LAB — CYCLIC CITRUL PEPTIDE ANTIBODY, IGG: Cyclic Citrullin Peptide Ab: <16 UNITS

## 2021-07-19 LAB — PTH, INTACT AND CALCIUM
Calcium: 9.7 mg/dL (ref 8.6–10.2)
PTH: 55 pg/mL (ref 16–77)

## 2021-07-19 LAB — CALCIUM, IONIZED: Calcium, Ion: 5.19 mg/dL (ref 4.8–5.6)

## 2021-07-19 LAB — ANGIOTENSIN CONVERTING ENZYME: Angiotensin-Converting Enzyme: 16 U/L (ref 9–67)

## 2021-07-19 LAB — ANTI-NUCLEAR AB-TITER (ANA TITER): ANA Titer 1: 1:80 {titer} — ABNORMAL HIGH

## 2021-07-19 LAB — ANA: Anti Nuclear Antibody (ANA): POSITIVE — AB

## 2021-07-19 LAB — RHEUMATOID FACTOR: Rheumatoid fact SerPl-aCnc: <14 IU/mL (ref <14)

## 2021-07-21 ENCOUNTER — Other Ambulatory Visit (HOSPITAL_COMMUNITY): Payer: Self-pay

## 2021-07-21 ENCOUNTER — Encounter: Payer: Self-pay | Admitting: Family Medicine

## 2021-07-21 MED ORDER — VITAMIN D (ERGOCALCIFEROL) 1.25 MG (50000 UNIT) PO CAPS
50000.0000 [IU] | ORAL_CAPSULE | ORAL | 0 refills | Status: DC
  Filled 2021-07-21: qty 4, 28d supply, fill #0

## 2021-07-21 NOTE — Addendum Note (Signed)
Addended by: Vilma Meckel R on: 07/21/2021 11:56 AM   Modules accepted: Orders

## 2021-07-22 ENCOUNTER — Other Ambulatory Visit (HOSPITAL_COMMUNITY): Payer: Self-pay

## 2021-07-23 ENCOUNTER — Other Ambulatory Visit (HOSPITAL_COMMUNITY): Payer: Self-pay

## 2021-07-26 ENCOUNTER — Other Ambulatory Visit (HOSPITAL_COMMUNITY): Payer: Self-pay

## 2021-07-27 DIAGNOSIS — Z3042 Encounter for surveillance of injectable contraceptive: Secondary | ICD-10-CM | POA: Diagnosis not present

## 2021-07-28 ENCOUNTER — Other Ambulatory Visit (HOSPITAL_COMMUNITY): Payer: Self-pay

## 2021-07-29 ENCOUNTER — Ambulatory Visit (HOSPITAL_COMMUNITY)
Admission: RE | Admit: 2021-07-29 | Discharge: 2021-07-29 | Disposition: A | Discharge status: Home / Self Care | Payer: 59 | Source: Ambulatory Visit | Attending: Cardiology | Admitting: Cardiology

## 2021-07-29 ENCOUNTER — Other Ambulatory Visit: Payer: Self-pay

## 2021-07-29 DIAGNOSIS — M25561 Pain in right knee: Secondary | ICD-10-CM | POA: Diagnosis not present

## 2021-07-29 DIAGNOSIS — M25562 Pain in left knee: Secondary | ICD-10-CM | POA: Insufficient documentation

## 2021-07-29 DIAGNOSIS — G8929 Other chronic pain: Secondary | ICD-10-CM | POA: Insufficient documentation

## 2021-08-11 ENCOUNTER — Other Ambulatory Visit (HOSPITAL_COMMUNITY): Payer: Self-pay

## 2021-08-19 ENCOUNTER — Other Ambulatory Visit (HOSPITAL_COMMUNITY): Payer: Self-pay

## 2021-08-20 ENCOUNTER — Other Ambulatory Visit (HOSPITAL_COMMUNITY): Payer: Self-pay

## 2021-08-20 MED ORDER — FLUCONAZOLE 150 MG PO TABS
ORAL_TABLET | ORAL | 1 refills | Status: DC
  Filled 2021-08-20: qty 1, 1d supply, fill #0

## 2021-08-30 ENCOUNTER — Other Ambulatory Visit (HOSPITAL_COMMUNITY): Payer: Self-pay

## 2021-08-30 DIAGNOSIS — R634 Abnormal weight loss: Secondary | ICD-10-CM | POA: Diagnosis not present

## 2021-08-30 DIAGNOSIS — R300 Dysuria: Secondary | ICD-10-CM | POA: Diagnosis not present

## 2021-08-30 MED ORDER — FLUCONAZOLE 150 MG PO TABS
ORAL_TABLET | ORAL | 2 refills | Status: DC
  Filled 2021-08-30: qty 1, 1d supply, fill #0
  Filled 2021-09-18: qty 1, 1d supply, fill #1
  Filled 2021-09-29: qty 1, 1d supply, fill #2

## 2021-09-02 ENCOUNTER — Other Ambulatory Visit: Payer: Self-pay

## 2021-09-02 ENCOUNTER — Ambulatory Visit: Payer: 59 | Admitting: Family Medicine

## 2021-09-02 DIAGNOSIS — M222X1 Patellofemoral disorders, right knee: Secondary | ICD-10-CM | POA: Diagnosis not present

## 2021-09-02 DIAGNOSIS — E11618 Type 2 diabetes mellitus with other diabetic arthropathy: Secondary | ICD-10-CM

## 2021-09-02 DIAGNOSIS — Z794 Long term (current) use of insulin: Secondary | ICD-10-CM | POA: Diagnosis not present

## 2021-09-02 NOTE — Patient Instructions (Signed)
We'll get approval for gel and will call to set up follow up appointment

## 2021-09-02 NOTE — Assessment & Plan Note (Signed)
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  I would like to see if we can get approval for the viscosupplementation which I think may actually help better, longer duration as well and decrease the risk for anything such as her diabetes.  Increase activity slowly.  Patient will follow-up once we have approval for the gel injections hopefully.  Total time with patient today discussing different treatment options including steroid injections, reviewing imaging including her knee x-rays greater than 31 minutes.

## 2021-09-02 NOTE — Progress Notes (Signed)
[facility] [place] Phone: [phone] Subjective:    I'm seeing this patient by the request  of:  Seward Carol, MD  CC:   [id number]  07/16/2021 Significant atrophy of the lower extremities.  We will check ABI to make sure that this is not vascular in nature.  X-rays of the back to make sure neurogenic. X-rays are pending to see how much arthritic changes are contributing.  Ultrasound did not show any significant swelling.  I do feel though the patient does have significant atrophy noted of the lower extremities.  This could be secondary to patient being somewhat difficult to control diabetes patient is also had irritable bowel and been diagnosed with potential Crohn's disease.  We discussed that with this may be some malabsorption we will start on once weekly vitamin D follow-up again 4 to 8 weeks  Updated 09/02/2021 Cindy Long is a 45 y.o. female coming in with complaint of right knee pain. Pain remains the same. Still hurting going up and down steps. No new complaints.  Xray IMPRESSION: Right Degenerative change without acute abnormality.  IMPRESSION: Left Mild degenerative change without acute abnormality.  IMPRESSION: Lumbar Mild degenerative change without acute abnormality.      Past Medical History:  Diagnosis Date   Crohn disease ([place])    Diabetes mellitus    IDDM   GERD (gastroesophageal reflux disease)    no meds currently   High cholesterol    Hypertension    Mastitis    right breast   Postpartum care following cesarean delivery (2/10) 12/17/2013   Past Surgical History:  Procedure Laterality Date   CERVICAL CERCLAGE     CERVICAL CERCLAGE N/A 06/21/2013   Procedure: [place] CERCLAGE CERVICAL;  Surgeon: Marvene Staff, MD;  Location: [facility];  Service: Gynecology;  Laterality: N/A;   CESAREAN SECTION     CESAREAN SECTION N/A 12/17/2013   Procedure: Repeat CESAREAN SECTION with  Cerclage Removal;  Surgeon: Marvene Staff, MD;  Location: [facility];  Service: Obstetrics;  Laterality: N/A;  EDD: 12/22/13   CHOLECYSTECTOMY     CHOLECYSTECTOMY OPEN  08/2011   LAPAROSCOPIC ENDOMETRIOSIS FULGURATION  01/2011   Social History   Socioeconomic History   Marital status: Married    Spouse name: Not on file   Number of children: Not on file   Years of education: Not on file   Highest education level: Not on file  Occupational History   Not on file  Tobacco Use   Smoking status: Never   Smokeless tobacco: Never  Substance and Sexual Activity   Alcohol use: Yes    Comment: occasionally   Drug use: No   Sexual activity: Not on file  Other Topics Concern   Not on file  Social History Narrative   Not on file   Social Determinants of Health   Financial Resource Strain: Not on file  Food Insecurity: Not on file  Transportation Needs: Not on file  Physical Activity: Not on file  Stress: Not on file  Social Connections: Not on file   Allergies  Allergen Reactions   Metronidazole Swelling    Throat swelling   Shellfish Allergy Swelling    Swelling of the throat   Family History  Problem Relation Age of Onset   Diabetes Maternal Aunt    Cancer Maternal Grandmother        breast, colon   Diabetes Maternal Grandmother     Current  Outpatient Medications (Endocrine & Metabolic):    dapagliflozin propanediol (FARXIGA) 10 MG TABS tablet, Take 1 tablet by mouth once a day   Dulaglutide 0.75 MG/0.5ML SOPN, INJECT 0.75MG UNDER THE SKIN ONCE A WEEK   Dulaglutide 1.5 MG/0.5ML SOPN, INJECT 1 PEN UNDER THE SKIN ONCE A WEEK.   FARXIGA 5 MG TABS tablet, Take 10 mg by mouth daily.   HUMULIN R U-500 KWIKPEN 500 UNIT/ML kwikpen, Inject into the skin See admin instructions. Inject 90 at lunch and 50 at dinner   insulin regular human CONCENTRATED (HUMULIN R U-500 KWIKPEN) 500 UNIT/ML kwikpen, Inject 90 units under the skin at lunch and 40 units at evening meal   insulin  regular human CONCENTRATED (HUMULIN R) 500 UNIT/ML kwikpen, INJECT 100 U UNDER THE SKIN BEFORE BREAKFAST, 70 UNITS BEFORE LUNCH AND 50 UNITS BEFORE EVENING MEAL. MUST HAVE OFFICE VISIT BEFORE NEXT REFILL (Patient taking differently: Inject 90 before lunch and 50 every evening)   medroxyPROGESTERone (DEPO-PROVERA) 150 MG/ML injection, Inject 150 mg into the muscle every 3 (three) months.   medroxyPROGESTERone Acetate (DEPO-PROVERA) 150 MG/ML SUSY, Inject 1 ml into the muscle every 3 months   medroxyPROGESTERone Acetate 150 MG/ML SUSY, INJECT 1 ML INTO THE MUSCLE EVERY 3 MONTH   medroxyPROGESTERone Acetate 150 MG/ML SUSY, INJECT 1 ML EVERY 11-12 WEEKS (Patient not taking: Reported on 03/09/2021)   Semaglutide,0.25 or 0.5MG/DOS, (OZEMPIC, 0.25 OR 0.5 MG/DOSE,) 2 MG/1.5ML SOPN, Inject 0.25 mg once a week for 4 weeks then increase to 0.5 mg once a week subcutaneously  Current Outpatient Medications (Cardiovascular):    atorvastatin (LIPITOR) 10 MG tablet, Take 10 mg by mouth daily.   atorvastatin (LIPITOR) 10 MG tablet, TAKE 1 TABLET BY MOUTH EVERY EVENING AT BEDTIME   colestipol (COLESTID) 1 g tablet, Take 2 tablets by mouth once daily for 14 days   losartan (COZAAR) 50 MG tablet, Take 50 mg by mouth daily.   losartan (COZAAR) 50 MG tablet, TAKE 1 TABLET BY MOUTH ONCE DAILY   spironolactone (ALDACTONE) 50 MG tablet, Take 50 mg by mouth daily.   spironolactone (ALDACTONE) 50 MG tablet, TAKE 1 TABLET BY MOUTH ONCE DAILY  Current Outpatient Medications (Respiratory):    albuterol (PROVENTIL HFA;VENTOLIN HFA) 108 (90 Base) MCG/ACT inhaler, Inhale 1-2 puffs into the lungs every 6 (six) hours as needed for wheezing or shortness of breath.   albuterol (VENTOLIN HFA) 108 (90 Base) MCG/ACT inhaler, INHALE 1 PUFFS BY MOUTH INTO THE LUNGS EVERY 4 HOURS AS NEEDED   promethazine (PHENERGAN) 25 MG tablet, Take 25 mg by mouth every 6 (six) hours as needed for nausea or vomiting.   promethazine (PHENERGAN) 25 MG  tablet, TAKE 1 TABLET BY MOUTH EVERY 8 HOURS  Current Outpatient Medications (Analgesics):    Adalimumab (HUMIRA PEN) 40 MG/0.8ML PNKT, Inject under the skin as directed every 14 days.   Adalimumab 40 MG/0.8ML PNKT, INJECT UNDER THE SKIN AS DIRECTED EVERY 14 DAYS.   ibuprofen (ADVIL) 600 MG tablet, Take 1 tablet (600 mg total) by mouth every 6 (six) hours as needed.   Current Outpatient Medications (Other):    alosetron (LOTRONEX) 0.5 MG tablet, Take 1 tablet by mouth twice daily for 7 days   Bacillus Coagulans-Inulin (PROBIOTIC) 1-250 BILLION-MG CAPS, Take 1 capsule by mouth daily.   Biotin 10000 MCG TABS, Take 1 tablet by mouth daily.   ciprofloxacin (CIPRO) 500 MG tablet, Take 1 tablet (500 mg total) by mouth 2 (two) times daily.   ciprofloxacin (CIPRO) 500 MG  tablet, Take 1 tablet (500 mg total) by mouth 2 (two) times daily.   clidinium-chlordiazePOXIDE (LIBRAX) 5-2.5 MG capsule, Take 1 capsule 3 times a day before meals as needed   Continuous Blood Gluc Receiver (DEXCOM G6 RECEIVER) DEVI, USE AS DIRECTED   Continuous Blood Gluc Sensor (DEXCOM G6 SENSOR) MISC, CHANGE SENSOR EVERY 10 DAYS   Continuous Blood Gluc Sensor (FREESTYLE LIBRE 14 DAY SENSOR) MISC, APPLY TO THE BACK OF THE UPPER ARM EVERY 14 DAYS   Continuous Blood Gluc Transmit (DEXCOM G6 TRANSMITTER) MISC, CHANGE TRANSMITTER ONCE EVERY 90 DAYS   dicyclomine (BENTYL) 20 MG tablet, Take 1 tablet (20 mg total) by mouth 2 (two) times daily.   dicyclomine (BENTYL) 20 MG tablet, Take 1 tablet (20 mg total) by mouth 2 (two) times daily.   escitalopram (LEXAPRO) 20 MG tablet, TAKE 2 TABLETS BY MOUTH ONCE A DAY.   escitalopram (LEXAPRO) 20 MG tablet, Take 2 tablets by mouth daily. Needs to contact office for more refills   escitalopram (LEXAPRO) 20 MG tablet, Take 2 tablets by mouth once a day   escitalopram (LEXAPRO) 5 MG tablet, Take 1 tablet by mouth 3 (three) times daily.   fluconazole (DIFLUCAN) 150 MG tablet, TAKE 1 TABLET BY MOUTH  FOR 1 DOSE AS DIRECTED   fluconazole (DIFLUCAN) 150 MG tablet, TAKE 1 TABLET BY MOUTH EVERY OTHER DAY   fluconazole (DIFLUCAN) 150 MG tablet, Take 1 tablet by mouth as a one time dose   fluconazole (DIFLUCAN) 150 MG tablet, Take 1 tablet by mouth for one dose.   gabapentin (NEURONTIN) 300 MG capsule, Take 1 capsule by mouth 3 (three) times daily.   hydrOXYzine (ATARAX/VISTARIL) 25 MG tablet, Take 1 tablet (25 mg total) by mouth every 8 (eight) hours as needed.   hyoscyamine (LEVBID) 0.375 MG 12 hr tablet, Take 1 tablet (0.375 mg total) by mouth daily as needed.   hyoscyamine (LEVSIN, ANASPAZ) 0.125 MG tablet, Take 1-2 tablets (0.125-0.25 mg total) by mouth every 4 (four) hours as needed.   Insulin Pen Needle 32G X 4 MM MISC, by Does not apply route. BD U/F Pen Needles Nano 4 mm x 32 G; use with insulin   LORazepam (ATIVAN) 1 MG tablet, Take 0.5 mg by mouth daily as needed.   Melatonin 10 MG CAPS, Take 1 capsule by mouth daily.   nystatin-triamcinolone (MYCOLOG II) cream, APPLY TO THE AFFECTED AREA(S) TWO TIMES DAILY AS DIRECTED   ondansetron (ZOFRAN ODT) 4 MG disintegrating tablet, Take 1 tablet (4 mg total) by mouth every 8 (eight) hours as needed for nausea or vomiting.   ondansetron (ZOFRAN-ODT) 4 MG disintegrating tablet, Dissolve 1 tablet (4 mg total) by mouth every 8 (eight) hours as needed for nausea or vomiting   pantoprazole (PROTONIX) 40 MG tablet, TAKE 1 TABLET BY MOUTH ONCE DAILY   pantoprazole (PROTONIX) 40 MG tablet, TAKE 1 TABLET BY MOUTH ONCE A DAY   pantoprazole (PROTONIX) 40 MG tablet, TAKE 1 TABLET BY MOUTH ONCE A DAY   polyethylene glycol-electrolytes (NULYTELY) 420 g solution, Use as directed   Probiotic Product (CULTURELLE IMMUNE DEFENSE PO), Take 1 tablet by mouth daily.   Vitamin D, Ergocalciferol, (DRISDOL) 1.25 MG (50000 UNIT) CAPS capsule, Take 1 capsule by mouth every seven days.   zonisamide (ZONEGRAN) 25 MG capsule, TAKE 2 CAPSULES BY MOUTH DAILY (Patient not taking:  No sig reported)   Reviewed prior external information including notes and imaging from  primary care provider As well as notes that were  available from care everywhere and other healthcare systems.  Past medical history, social, surgical and family history all reviewed in electronic medical record.  No pertanent information unless stated regarding to the chief complaint.   Review of Systems:  No headache, visual changes, nausea, vomiting, diarrhea, constipation, dizziness, abdominal pain, skin rash, fevers, chills, night sweats, weight loss, swollen lymph nodes, body aches, joint swelling, chest pain, shortness of breath, mood changes. POSITIVE muscle aches  Objective  Height 5' 7"  (1.702 m), unknown if currently breastfeeding.   General: No apparent distress alert and oriented x3 mood and affect normal, dressed appropriately.  HEENT: Pupils equal, extraocular movements intact  Respiratory: Patient's speak in full sentences and does not appear short of breath  Cardiovascular: No lower extremity edema, non tender, no erythema  Gait normal with good balance and coordination.  MSK:  Non tender with full range of motion and good stability and symmetric strength and tone of shoulders, elbows, wrist, hip, knee and ankles bilaterally.     Impression and Recommendations:     The above documentation has been reviewed and is accurate and complete Cindy Long

## 2021-09-02 NOTE — Assessment & Plan Note (Signed)
Last A1c was significantly high.  Encourage patient to follow-up with her endocrinologist.

## 2021-09-02 NOTE — Progress Notes (Signed)
[place] Phone: [phone] Subjective:    I'm seeing this patient by the request  of:  Seward Carol, MD  CC: Bilateral knee pain, back pain follow-up  OBS:[id number]  TYTIANNA GREENLEY is a 45 y.o. female coming in with complaint of bilateral knee pain.  Patient has had this for quite some time.  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  Patient states no significant change in the pain at the moment of the knees.  Continuing to give her difficulty such as going up or down stairs.      Past Medical History:  Diagnosis Date   Crohn disease ([place])    Diabetes mellitus    IDDM   GERD (gastroesophageal reflux disease)    no meds currently   High cholesterol    Hypertension    Mastitis    right breast   Postpartum care following cesarean delivery (2/10) 12/17/2013   Past Surgical History:  Procedure Laterality Date   CERVICAL CERCLAGE     CERVICAL CERCLAGE N/A 06/21/2013   Procedure: [place] CERCLAGE CERVICAL;  Surgeon: Marvene Staff, MD;  Location: [facility];  Service: Gynecology;  Laterality: N/A;   CESAREAN SECTION     CESAREAN SECTION N/A 12/17/2013   Procedure: Repeat CESAREAN SECTION with Cerclage Removal;  Surgeon: Marvene Staff, MD;  Location: [facility];  Service: Obstetrics;  Laterality: N/A;  EDD: 12/22/13   CHOLECYSTECTOMY     CHOLECYSTECTOMY OPEN  08/2011   LAPAROSCOPIC ENDOMETRIOSIS FULGURATION  01/2011   Social History   Socioeconomic History   Marital status: Married    Spouse name: Not on file   Number of children: Not on file   Years of education: Not on file   Highest education level: Not on file  Occupational History   Not on file  Tobacco Use   Smoking status: Never   Smokeless tobacco: Never  Substance and Sexual Activity   Alcohol use: Yes    Comment: occasionally   Drug use: No   Sexual  activity: Not on file  Other Topics Concern   Not on file  Social History Narrative   Not on file   Social Determinants of Health   Financial Resource Strain: Not on file  Food Insecurity: Not on file  Transportation Needs: Not on file  Physical Activity: Not on file  Stress: Not on file  Social Connections: Not on file   Allergies  Allergen Reactions   Metronidazole Swelling    Throat swelling   Shellfish Allergy Swelling    Swelling of the throat   Family History  Problem Relation Age of Onset   Diabetes Maternal Aunt    Cancer Maternal Grandmother        breast, colon   Diabetes Maternal Grandmother     Current Outpatient Medications (Endocrine & Metabolic):    dapagliflozin propanediol (FARXIGA) 10 MG TABS tablet, Take 1 tablet by mouth once a day   Dulaglutide 0.75 MG/0.5ML SOPN, INJECT 0.75MG UNDER THE SKIN ONCE A WEEK   Dulaglutide 1.5 MG/0.5ML SOPN, INJECT 1 PEN UNDER THE SKIN ONCE A WEEK.   FARXIGA 5 MG TABS tablet, Take 10 mg by mouth daily.   HUMULIN R U-500 KWIKPEN 500 UNIT/ML kwikpen, Inject into the skin See admin instructions. Inject 90 at lunch and 50 at dinner   insulin regular human CONCENTRATED (HUMULIN R U-500 KWIKPEN) 500  UNIT/ML kwikpen, Inject 90 units under the skin at lunch and 40 units at evening meal   insulin regular human CONCENTRATED (HUMULIN R) 500 UNIT/ML kwikpen, INJECT 100 U UNDER THE SKIN BEFORE BREAKFAST, 70 UNITS BEFORE LUNCH AND 50 UNITS BEFORE EVENING MEAL. MUST HAVE OFFICE VISIT BEFORE NEXT REFILL (Patient taking differently: Inject 90 before lunch and 50 every evening)   medroxyPROGESTERone (DEPO-PROVERA) 150 MG/ML injection, Inject 150 mg into the muscle every 3 (three) months.   medroxyPROGESTERone Acetate (DEPO-PROVERA) 150 MG/ML SUSY, Inject 1 ml into the muscle every 3 months   medroxyPROGESTERone Acetate 150 MG/ML SUSY, INJECT 1 ML INTO THE MUSCLE EVERY 3 MONTH   medroxyPROGESTERone Acetate 150 MG/ML SUSY, INJECT 1 ML EVERY 11-12  WEEKS (Patient not taking: Reported on 03/09/2021)   Semaglutide,0.25 or 0.5MG/DOS, (OZEMPIC, 0.25 OR 0.5 MG/DOSE,) 2 MG/1.5ML SOPN, Inject 0.25 mg once a week for 4 weeks then increase to 0.5 mg once a week subcutaneously  Current Outpatient Medications (Cardiovascular):    atorvastatin (LIPITOR) 10 MG tablet, Take 10 mg by mouth daily.   atorvastatin (LIPITOR) 10 MG tablet, TAKE 1 TABLET BY MOUTH EVERY EVENING AT BEDTIME   colestipol (COLESTID) 1 g tablet, Take 2 tablets by mouth once daily for 14 days   losartan (COZAAR) 50 MG tablet, Take 50 mg by mouth daily.   losartan (COZAAR) 50 MG tablet, TAKE 1 TABLET BY MOUTH ONCE DAILY   spironolactone (ALDACTONE) 50 MG tablet, Take 50 mg by mouth daily.   spironolactone (ALDACTONE) 50 MG tablet, TAKE 1 TABLET BY MOUTH ONCE DAILY  Current Outpatient Medications (Respiratory):    albuterol (PROVENTIL HFA;VENTOLIN HFA) 108 (90 Base) MCG/ACT inhaler, Inhale 1-2 puffs into the lungs every 6 (six) hours as needed for wheezing or shortness of breath.   albuterol (VENTOLIN HFA) 108 (90 Base) MCG/ACT inhaler, INHALE 1 PUFFS BY MOUTH INTO THE LUNGS EVERY 4 HOURS AS NEEDED   promethazine (PHENERGAN) 25 MG tablet, Take 25 mg by mouth every 6 (six) hours as needed for nausea or vomiting.   promethazine (PHENERGAN) 25 MG tablet, TAKE 1 TABLET BY MOUTH EVERY 8 HOURS  Current Outpatient Medications (Analgesics):    Adalimumab (HUMIRA PEN) 40 MG/0.8ML PNKT, Inject under the skin as directed every 14 days.   Adalimumab 40 MG/0.8ML PNKT, INJECT UNDER THE SKIN AS DIRECTED EVERY 14 DAYS.   ibuprofen (ADVIL) 600 MG tablet, Take 1 tablet (600 mg total) by mouth every 6 (six) hours as needed.   Current Outpatient Medications (Other):    alosetron (LOTRONEX) 0.5 MG tablet, Take 1 tablet by mouth twice daily for 7 days   Bacillus Coagulans-Inulin (PROBIOTIC) 1-250 BILLION-MG CAPS, Take 1 capsule by mouth daily.   Biotin 10000 MCG TABS, Take 1 tablet by mouth daily.    ciprofloxacin (CIPRO) 500 MG tablet, Take 1 tablet (500 mg total) by mouth 2 (two) times daily.   ciprofloxacin (CIPRO) 500 MG tablet, Take 1 tablet (500 mg total) by mouth 2 (two) times daily.   clidinium-chlordiazePOXIDE (LIBRAX) 5-2.5 MG capsule, Take 1 capsule 3 times a day before meals as needed   Continuous Blood Gluc Receiver (DEXCOM G6 RECEIVER) DEVI, USE AS DIRECTED   Continuous Blood Gluc Sensor (DEXCOM G6 SENSOR) MISC, CHANGE SENSOR EVERY 10 DAYS   Continuous Blood Gluc Sensor (FREESTYLE LIBRE 14 DAY SENSOR) MISC, APPLY TO THE BACK OF THE UPPER ARM EVERY 14 DAYS   Continuous Blood Gluc Transmit (DEXCOM G6 TRANSMITTER) MISC, CHANGE TRANSMITTER ONCE EVERY 90 DAYS  dicyclomine (BENTYL) 20 MG tablet, Take 1 tablet (20 mg total) by mouth 2 (two) times daily.   dicyclomine (BENTYL) 20 MG tablet, Take 1 tablet (20 mg total) by mouth 2 (two) times daily.   escitalopram (LEXAPRO) 20 MG tablet, TAKE 2 TABLETS BY MOUTH ONCE A DAY.   escitalopram (LEXAPRO) 20 MG tablet, Take 2 tablets by mouth daily. Needs to contact office for more refills   escitalopram (LEXAPRO) 20 MG tablet, Take 2 tablets by mouth once a day   escitalopram (LEXAPRO) 5 MG tablet, Take 1 tablet by mouth 3 (three) times daily.   fluconazole (DIFLUCAN) 150 MG tablet, TAKE 1 TABLET BY MOUTH FOR 1 DOSE AS DIRECTED   fluconazole (DIFLUCAN) 150 MG tablet, TAKE 1 TABLET BY MOUTH EVERY OTHER DAY   fluconazole (DIFLUCAN) 150 MG tablet, Take 1 tablet by mouth as a one time dose   fluconazole (DIFLUCAN) 150 MG tablet, Take 1 tablet by mouth for one dose.   gabapentin (NEURONTIN) 300 MG capsule, Take 1 capsule by mouth 3 (three) times daily.   hydrOXYzine (ATARAX/VISTARIL) 25 MG tablet, Take 1 tablet (25 mg total) by mouth every 8 (eight) hours as needed.   hyoscyamine (LEVBID) 0.375 MG 12 hr tablet, Take 1 tablet (0.375 mg total) by mouth daily as needed.   hyoscyamine (LEVSIN, ANASPAZ) 0.125 MG tablet, Take 1-2 tablets (0.125-0.25 mg  total) by mouth every 4 (four) hours as needed.   Insulin Pen Needle 32G X 4 MM MISC, by Does not apply route. BD U/F Pen Needles Nano 4 mm x 32 G; use with insulin   LORazepam (ATIVAN) 1 MG tablet, Take 0.5 mg by mouth daily as needed.   Melatonin 10 MG CAPS, Take 1 capsule by mouth daily.   nystatin-triamcinolone (MYCOLOG II) cream, APPLY TO THE AFFECTED AREA(S) TWO TIMES DAILY AS DIRECTED   ondansetron (ZOFRAN ODT) 4 MG disintegrating tablet, Take 1 tablet (4 mg total) by mouth every 8 (eight) hours as needed for nausea or vomiting.   ondansetron (ZOFRAN-ODT) 4 MG disintegrating tablet, Dissolve 1 tablet (4 mg total) by mouth every 8 (eight) hours as needed for nausea or vomiting   pantoprazole (PROTONIX) 40 MG tablet, TAKE 1 TABLET BY MOUTH ONCE DAILY   pantoprazole (PROTONIX) 40 MG tablet, TAKE 1 TABLET BY MOUTH ONCE A DAY   pantoprazole (PROTONIX) 40 MG tablet, TAKE 1 TABLET BY MOUTH ONCE A DAY   polyethylene glycol-electrolytes (NULYTELY) 420 g solution, Use as directed   Probiotic Product (CULTURELLE IMMUNE DEFENSE PO), Take 1 tablet by mouth daily.   Vitamin D, Ergocalciferol, (DRISDOL) 1.25 MG (50000 UNIT) CAPS capsule, Take 1 capsule by mouth every seven days.   zonisamide (ZONEGRAN) 25 MG capsule, TAKE 2 CAPSULES BY MOUTH DAILY (Patient not taking: No sig reported)   Reviewed prior external information including notes and imaging from  primary care provider As well as notes that were available from care everywhere and other healthcare systems.  Past medical history, social, surgical and family history all reviewed in electronic medical record.  No pertanent information unless stated regarding to the chief complaint.   Review of Systems:  No headache, visual changes, nausea, vomiting, diarrhea, constipation, dizziness, abdominal pain, skin rash, fevers, chills, night sweats, weight loss, swollen lymph nodes, body aches, joint swelling, chest pain, shortness of breath, mood changes.  POSITIVE muscle aches  Objective  Blood pressure 122/76, pulse 91, height 5' 7"  (1.702 m), weight 157 lb (71.2 kg), SpO2 99 %, unknown if currently  breastfeeding.   General: No apparent distress alert and oriented x3 mood and affect normal, dressed appropriately.  HEENT: Pupils equal, extraocular movements intact  Respiratory: Patient's speak in full sentences and does not appear short of breath  Cardiovascular: No lower extremity edema, non tender, no erythema  Gait normal with good balance and coordination.  MSK: Patient still has atrophy of the lower extremities noted.  Patient does have lateral tracking of the patella.  Patient does have crepitus noted.  Positive patellar grind test noted.    Impression and Recommendations:     The above documentation has been reviewed and is accurate and complete Lyndal Pulley, DO

## 2021-09-04 ENCOUNTER — Other Ambulatory Visit (HOSPITAL_COMMUNITY): Payer: Self-pay

## 2021-09-04 MED ORDER — OXYBUTYNIN CHLORIDE 5 MG PO TABS
ORAL_TABLET | ORAL | 0 refills | Status: DC
  Filled 2021-09-04: qty 14, 7d supply, fill #0

## 2021-09-07 DIAGNOSIS — R300 Dysuria: Secondary | ICD-10-CM | POA: Diagnosis not present

## 2021-09-08 ENCOUNTER — Other Ambulatory Visit (HOSPITAL_COMMUNITY): Payer: Self-pay

## 2021-09-08 MED ORDER — CIPROFLOXACIN HCL 250 MG PO TABS
ORAL_TABLET | ORAL | 0 refills | Status: DC
  Filled 2021-09-08: qty 10, 5d supply, fill #0

## 2021-09-10 ENCOUNTER — Encounter (HOSPITAL_COMMUNITY)
Admission: RE | Admit: 2021-09-10 | Discharge: 2021-09-10 | Disposition: A | Discharge status: Home / Self Care | Payer: 59 | Source: Ambulatory Visit | Attending: Gastroenterology | Admitting: Gastroenterology

## 2021-09-10 ENCOUNTER — Other Ambulatory Visit: Payer: Self-pay

## 2021-09-10 DIAGNOSIS — K50818 Crohn's disease of both small and large intestine with other complication: Secondary | ICD-10-CM | POA: Diagnosis not present

## 2021-09-10 MED ORDER — SODIUM CHLORIDE 0.9 % IV SOLN
5.0000 mg/kg | Freq: Once | INTRAVENOUS | Status: DC
  Administered 2021-09-10: 400 mg via INTRAVENOUS
  Filled 2021-09-10: qty 40

## 2021-09-10 MED ORDER — DIPHENHYDRAMINE HCL 25 MG PO CAPS: 50.0000 mg | ORAL_CAPSULE | Freq: Once | ORAL | Status: DC

## 2021-09-10 MED ORDER — ACETAMINOPHEN 325 MG PO TABS: 650.0000 mg | ORAL_TABLET | Freq: Once | ORAL | Status: DC

## 2021-09-10 NOTE — Progress Notes (Signed)
Pt in infusion clinic for IV Remicade. States has been on Cipro for 2 days for UTI. Spoke with Dr Encarnacion Slates nurse (Amy) who talked with Dr Therisa Doyne and we were given the OK to proceed with Remicade as ordered. Also received a text message from Dr. Therisa Doyne with the same information.

## 2021-09-18 ENCOUNTER — Other Ambulatory Visit (HOSPITAL_COMMUNITY): Payer: Self-pay

## 2021-09-18 MED FILL — Atorvastatin Calcium Tab 10 MG (Base Equivalent): ORAL | 90 days supply | Qty: 90 | Fill #0 | Status: AC

## 2021-09-20 ENCOUNTER — Other Ambulatory Visit (HOSPITAL_COMMUNITY): Payer: Self-pay

## 2021-09-20 MED ORDER — SPIRONOLACTONE 50 MG PO TABS
ORAL_TABLET | ORAL | 11 refills | Status: DC
  Filled 2021-09-20: qty 30, 30d supply, fill #0

## 2021-09-22 ENCOUNTER — Other Ambulatory Visit (HOSPITAL_COMMUNITY): Payer: Self-pay

## 2021-09-24 ENCOUNTER — Other Ambulatory Visit (HOSPITAL_COMMUNITY): Payer: Self-pay

## 2021-09-25 ENCOUNTER — Other Ambulatory Visit (HOSPITAL_COMMUNITY): Payer: Self-pay

## 2021-09-25 MED ORDER — NYSTATIN-TRIAMCINOLONE 100000-0.1 UNIT/GM-% EX CREA
TOPICAL_CREAM | CUTANEOUS | 2 refills | Status: DC
  Filled 2021-09-25: qty 30, 10d supply, fill #0

## 2021-09-29 ENCOUNTER — Other Ambulatory Visit (HOSPITAL_COMMUNITY): Payer: Self-pay

## 2021-10-19 ENCOUNTER — Other Ambulatory Visit (HOSPITAL_COMMUNITY): Payer: Self-pay

## 2021-10-20 DIAGNOSIS — Z3042 Encounter for surveillance of injectable contraceptive: Secondary | ICD-10-CM | POA: Diagnosis not present

## 2021-10-21 ENCOUNTER — Encounter: Payer: Self-pay | Admitting: Family Medicine

## 2021-10-21 ENCOUNTER — Other Ambulatory Visit: Payer: Self-pay

## 2021-10-21 ENCOUNTER — Ambulatory Visit (INDEPENDENT_AMBULATORY_CARE_PROVIDER_SITE_OTHER): Payer: 59 | Admitting: Family Medicine

## 2021-10-21 DIAGNOSIS — M1710 Unilateral primary osteoarthritis, unspecified knee: Secondary | ICD-10-CM

## 2021-10-21 DIAGNOSIS — M62569 Muscle wasting and atrophy, not elsewhere classified, unspecified lower leg: Secondary | ICD-10-CM | POA: Diagnosis not present

## 2021-10-21 DIAGNOSIS — K5010 Crohn's disease of large intestine without complications: Secondary | ICD-10-CM | POA: Diagnosis not present

## 2021-10-21 NOTE — Assessment & Plan Note (Signed)
Viscosupplementation given today.  Tolerated procedure well.  Patient knows the postinjection instructions and what to watch out for and when to seek medical attention.  I am hopeful that this will do fairly good in this individual.  Once again secondary to patient's diabetes relative contraindication would be steroid injections but if necessary we can always try this as well.  Follow-up with me again 4 to 8 weeks.

## 2021-10-21 NOTE — Progress Notes (Signed)
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  I would like to see if we can get approval for the viscosupplementation which I think may actually help better, longer duration as well and decrease the risk for anything such as her diabetes.  Increase activity slowly.  Patient will follow-up once we have approval for the gel injections hopefully.  Total time with patient today discussing different treatment options including steroid injections, reviewing imaging including her knee x-rays greater than 31 minutes.  Updated 10/21/2021 Cindy Long is a 45 y.o. female coming in with complaint of bilateral knee pain. Pain in knees worse. Wants injections today       Past Medical History:  Diagnosis Date   Crohn disease ([place])    Diabetes mellitus    IDDM   GERD (gastroesophageal reflux disease)    no meds currently   High cholesterol    Hypertension    Mastitis    right breast   Postpartum care following cesarean delivery (2/10) 12/17/2013   Past Surgical History:  Procedure Laterality Date   CERVICAL CERCLAGE     CERVICAL CERCLAGE N/A 06/21/2013   Procedure: [place] CERCLAGE CERVICAL;  Surgeon: Marvene Staff, MD;  Location: [facility];  Service: Gynecology;  Laterality: N/A;   CESAREAN SECTION     CESAREAN SECTION N/A 12/17/2013   Procedure: Repeat CESAREAN SECTION with Cerclage Removal;  Surgeon: Marvene Staff, MD;  Location: [facility];  Service: Obstetrics;  Laterality: N/A;  EDD: 12/22/13   CHOLECYSTECTOMY     CHOLECYSTECTOMY OPEN  08/2011    LAPAROSCOPIC ENDOMETRIOSIS FULGURATION  01/2011   Social History   Socioeconomic History   Marital status: Married    Spouse name: Not on file   Number of children: Not on file   Years of education: Not on file   Highest education level: Not on file  Occupational History   Not on file  Tobacco Use   Smoking status: Never   Smokeless tobacco: Never  Substance and Sexual Activity   Alcohol use: Yes    Comment: occasionally   Drug use: No   Sexual activity: Not on file  Other Topics Concern   Not on file  Social History Narrative   Not on file   Social Determinants of Health   Financial Resource Strain: Not on file  Food Insecurity: Not on file  Transportation Needs: Not on file  Physical Activity: Not on file  Stress: Not on file  Social Connections: Not on file   Allergies  Allergen Reactions   Metronidazole Swelling    Throat swelling   Shellfish Allergy Swelling    Swelling of the throat   Family History  Problem Relation Age of Onset   Diabetes Maternal Aunt    Cancer Maternal Grandmother        breast, colon   Diabetes Maternal Grandmother     Current Outpatient Medications (Endocrine & Metabolic):    dapagliflozin propanediol (FARXIGA) 10 MG TABS tablet, Take 1 tablet by mouth once a day   Dulaglutide 0.75 MG/0.5ML SOPN, INJECT 0.75MG  UNDER THE  SKIN ONCE A WEEK   Dulaglutide 1.5 MG/0.5ML SOPN, INJECT 1 PEN UNDER THE SKIN ONCE A WEEK.   FARXIGA 5 MG TABS tablet, Take 10 mg by mouth daily.   HUMULIN R U-500 KWIKPEN 500 UNIT/ML kwikpen, Inject into the skin See admin instructions. Inject 90 at lunch and 50 at dinner   insulin regular human CONCENTRATED (HUMULIN R U-500 KWIKPEN) 500 UNIT/ML kwikpen, Inject 90 units under the skin at lunch and 40 units at evening meal   insulin regular human CONCENTRATED (HUMULIN R) 500 UNIT/ML kwikpen, INJECT 100 U UNDER THE SKIN BEFORE BREAKFAST, 70 UNITS BEFORE LUNCH AND 50 UNITS BEFORE EVENING MEAL. MUST HAVE OFFICE VISIT  BEFORE NEXT REFILL (Patient taking differently: Inject 90 before lunch and 50 every evening)   medroxyPROGESTERone (DEPO-PROVERA) 150 MG/ML injection, Inject 150 mg into the muscle every 3 (three) months.   medroxyPROGESTERone Acetate (DEPO-PROVERA) 150 MG/ML SUSY, Inject 1 ml into the muscle every 3 months   medroxyPROGESTERone Acetate 150 MG/ML SUSY, INJECT 1 ML INTO THE MUSCLE EVERY 3 MONTH   medroxyPROGESTERone Acetate 150 MG/ML SUSY, INJECT 1 ML EVERY 11-12 WEEKS (Patient not taking: Reported on 03/09/2021)   Semaglutide,0.25 or 0.5MG /DOS, (OZEMPIC, 0.25 OR 0.5 MG/DOSE,) 2 MG/1.5ML SOPN, Inject 0.25 mg once a week for 4 weeks then increase to 0.5 mg once a week subcutaneously  Current Outpatient Medications (Cardiovascular):    atorvastatin (LIPITOR) 10 MG tablet, Take 10 mg by mouth daily.   atorvastatin (LIPITOR) 10 MG tablet, TAKE 1 TABLET BY MOUTH EVERY EVENING AT BEDTIME   colestipol (COLESTID) 1 g tablet, Take 2 tablets by mouth once daily for 14 days   losartan (COZAAR) 50 MG tablet, Take 50 mg by mouth daily.   losartan (COZAAR) 50 MG tablet, TAKE 1 TABLET BY MOUTH ONCE DAILY   spironolactone (ALDACTONE) 50 MG tablet, Take 50 mg by mouth daily.   spironolactone (ALDACTONE) 50 MG tablet, TAKE 1 TABLET BY MOUTH ONCE DAILY   spironolactone (ALDACTONE) 50 MG tablet, Take 1 tablet by mouth every day  Current Outpatient Medications (Respiratory):    albuterol (PROVENTIL HFA;VENTOLIN HFA) 108 (90 Base) MCG/ACT inhaler, Inhale 1-2 puffs into the lungs every 6 (six) hours as needed for wheezing or shortness of breath.   albuterol (VENTOLIN HFA) 108 (90 Base) MCG/ACT inhaler, INHALE 1 PUFFS BY MOUTH INTO THE LUNGS EVERY 4 HOURS AS NEEDED   promethazine (PHENERGAN) 25 MG tablet, Take 25 mg by mouth every 6 (six) hours as needed for nausea or vomiting.   promethazine (PHENERGAN) 25 MG tablet, TAKE 1 TABLET BY MOUTH EVERY 8 HOURS  Current Outpatient Medications (Analgesics):    Adalimumab  (HUMIRA PEN) 40 MG/0.8ML PNKT, Inject under the skin as directed every 14 days.   Adalimumab 40 MG/0.8ML PNKT, INJECT UNDER THE SKIN AS DIRECTED EVERY 14 DAYS.   ibuprofen (ADVIL) 600 MG tablet, Take 1 tablet (600 mg total) by mouth every 6 (six) hours as needed.   Current Outpatient Medications (Other):    alosetron (LOTRONEX) 0.5 MG tablet, Take 1 tablet by mouth twice daily for 7 days   Bacillus Coagulans-Inulin (PROBIOTIC) 1-250 BILLION-MG CAPS, Take 1 capsule by mouth daily.   Biotin 10000 MCG TABS, Take 1 tablet by mouth daily.   ciprofloxacin (CIPRO) 250 MG tablet, Take 1 tablet by mouth twice daily for 5 days   ciprofloxacin (CIPRO) 500 MG tablet, Take 1 tablet (500 mg total) by mouth 2 (two) times daily.   ciprofloxacin (CIPRO) 500 MG tablet, Take  1 tablet (500 mg total) by mouth 2 (two) times daily.   clidinium-chlordiazePOXIDE (LIBRAX) 5-2.5 MG capsule, Take 1 capsule 3 times a day before meals as needed   Continuous Blood Gluc Receiver (DEXCOM G6 RECEIVER) DEVI, USE AS DIRECTED   Continuous Blood Gluc Sensor (DEXCOM G6 SENSOR) MISC, CHANGE SENSOR EVERY 10 DAYS   Continuous Blood Gluc Sensor (FREESTYLE LIBRE 14 DAY SENSOR) MISC, APPLY TO THE BACK OF THE UPPER ARM EVERY 14 DAYS   Continuous Blood Gluc Transmit (DEXCOM G6 TRANSMITTER) MISC, CHANGE TRANSMITTER ONCE EVERY 90 DAYS   dicyclomine (BENTYL) 20 MG tablet, Take 1 tablet (20 mg total) by mouth 2 (two) times daily.   dicyclomine (BENTYL) 20 MG tablet, Take 1 tablet (20 mg total) by mouth 2 (two) times daily.   escitalopram (LEXAPRO) 20 MG tablet, TAKE 2 TABLETS BY MOUTH ONCE A DAY.   escitalopram (LEXAPRO) 20 MG tablet, Take 2 tablets by mouth daily. Needs to contact office for more refills   escitalopram (LEXAPRO) 20 MG tablet, Take 2 tablets by mouth once a day   escitalopram (LEXAPRO) 5 MG tablet, Take 1 tablet by mouth 3 (three) times daily.   fluconazole (DIFLUCAN) 150 MG tablet, Take 1 tablet by mouth as a one time dose    fluconazole (DIFLUCAN) 150 MG tablet, Take 1 tablet by mouth for one dose.   gabapentin (NEURONTIN) 300 MG capsule, Take 1 capsule by mouth 3 (three) times daily.   hydrOXYzine (ATARAX/VISTARIL) 25 MG tablet, Take 1 tablet (25 mg total) by mouth every 8 (eight) hours as needed.   hyoscyamine (LEVBID) 0.375 MG 12 hr tablet, Take 1 tablet (0.375 mg total) by mouth daily as needed.   hyoscyamine (LEVSIN, ANASPAZ) 0.125 MG tablet, Take 1-2 tablets (0.125-0.25 mg total) by mouth every 4 (four) hours as needed.   Insulin Pen Needle 32G X 4 MM MISC, by Does not apply route. BD U/F Pen Needles Nano 4 mm x 32 G; use with insulin   LORazepam (ATIVAN) 1 MG tablet, Take 0.5 mg by mouth daily as needed.   Melatonin 10 MG CAPS, Take 1 capsule by mouth daily.   nystatin-triamcinolone (MYCOLOG II) cream, APPLY TO THE AFFECTED AREA(S) TWO TIMES DAILY AS DIRECTED   ondansetron (ZOFRAN ODT) 4 MG disintegrating tablet, Take 1 tablet (4 mg total) by mouth every 8 (eight) hours as needed for nausea or vomiting.   ondansetron (ZOFRAN-ODT) 4 MG disintegrating tablet, Dissolve 1 tablet (4 mg total) by mouth every 8 (eight) hours as needed for nausea or vomiting   oxybutynin (DITROPAN) 5 MG tablet, 1 tablet Orally Twice a day 7 days   pantoprazole (PROTONIX) 40 MG tablet, TAKE 1 TABLET BY MOUTH ONCE DAILY   pantoprazole (PROTONIX) 40 MG tablet, TAKE 1 TABLET BY MOUTH ONCE A DAY   pantoprazole (PROTONIX) 40 MG tablet, TAKE 1 TABLET BY MOUTH ONCE A DAY   polyethylene glycol-electrolytes (NULYTELY) 420 g solution, Use as directed   Probiotic Product (CULTURELLE IMMUNE DEFENSE PO), Take 1 tablet by mouth daily.   Vitamin D, Ergocalciferol, (DRISDOL) 1.25 MG (50000 UNIT) CAPS capsule, Take 1 capsule by mouth every seven days.   zonisamide (ZONEGRAN) 25 MG capsule, TAKE 2 CAPSULES BY MOUTH DAILY (Patient not taking: No sig reported)   Reviewed prior external information including notes and imaging from  primary care  provider As well as notes that were available from care everywhere and other healthcare systems.  Past medical history, social, surgical and family history all reviewed  in electronic medical record.  No pertanent information unless stated regarding to the chief complaint.   Review of Systems:  No headache, visual changes, nausea, vomiting, diarrhea, constipation, dizziness, abdominal pain, skin rash, fevers, chills, night sweats, weight loss, swollen lymph nodes, body aches, joint swelling, chest pain, shortness of breath, mood changes. POSITIVE muscle aches  Objective  Blood pressure 130/82, pulse 95, height 5\' 6"  (1.676 m), weight 151 lb (68.5 kg), SpO2 96 %, unknown if currently breastfeeding.   General: No apparent distress alert and oriented x3 mood and affect normal, dressed appropriately.  HEENT: Pupils equal, extraocular movements intact  Respiratory: Patient's speak in full sentences and does not appear short of breath  Cardiovascular: No lower extremity edema, non tender, no erythema  Gait antalgic MSK: Patient does have crepitus noted of the patellofemoral joint noted.  Lateral tracking of the patella noted.  Patient does have atrophy of the thighs bilaterally.  After informed written and verbal consent, patient was seated on exam table. Right knee was prepped with alcohol swab and utilizing anterolateral approach, patient's right knee space was injected with 60 mg per 3 mL of Durolane (sodium hyaluronate) in a prefilled syringe was injected easily into the knee through a 22-gauge needle..Patient tolerated the procedure well without immediate complications.  After informed written and verbal consent, patient was seated on exam table. Left knee was prepped with alcohol swab and utilizing anterolateral approach, patient's left knee space was injected with 60 mg per 3 mL of Durolane (sodium hyaluronate) in a prefilled syringe was injected easily into the knee through a 22-gauge  needle..Patient tolerated the procedure well without immediate complications.    Impression and Recommendations:     The above documentation has been reviewed and is accurate and complete Lyndal Pulley, DO

## 2021-10-21 NOTE — Assessment & Plan Note (Signed)
Secondary to patient's likely diabetes.  Patient does need to monitor for any type of peripheral neuropathy.

## 2021-10-21 NOTE — Patient Instructions (Signed)
Injection today 

## 2021-11-03 ENCOUNTER — Telehealth (HOSPITAL_BASED_OUTPATIENT_CLINIC_OR_DEPARTMENT_OTHER): Payer: Self-pay

## 2021-11-03 ENCOUNTER — Encounter (HOSPITAL_BASED_OUTPATIENT_CLINIC_OR_DEPARTMENT_OTHER): Payer: Self-pay

## 2021-11-03 NOTE — Telephone Encounter (Signed)
[facility] @ [facility] spoke with Danise Mina, Request information from primary care Dr. Delfina Redwood and from her endocrinologist Dr Buddy Duty with recent medical records regarding hypertension ,labs   Danise Mina will fax over any records to the [place] location at [phone]

## 2021-11-04 ENCOUNTER — Other Ambulatory Visit (HOSPITAL_COMMUNITY): Payer: Self-pay

## 2021-11-05 ENCOUNTER — Other Ambulatory Visit: Payer: Self-pay

## 2021-11-05 ENCOUNTER — Encounter (HOSPITAL_COMMUNITY)
Admission: RE | Admit: 2021-11-05 | Discharge: 2021-11-05 | Disposition: A | Discharge status: Home / Self Care | Payer: 59 | Source: Ambulatory Visit | Attending: Gastroenterology | Admitting: Gastroenterology

## 2021-11-05 DIAGNOSIS — K50818 Crohn's disease of both small and large intestine with other complication: Secondary | ICD-10-CM | POA: Diagnosis not present

## 2021-11-05 MED ORDER — ACETAMINOPHEN 325 MG PO TABS: 650.0000 mg | ORAL_TABLET | Freq: Once | ORAL | Status: DC

## 2021-11-05 MED ORDER — INFLIXIMAB 100 MG IV SOLR
5.0000 mg/kg | Freq: Once | INTRAVENOUS | Status: DC
Start: 2021-11-05 — End: 2021-11-05
  Administered 2021-11-05: 10:00:00 300 mg via INTRAVENOUS
  Filled 2021-11-05: qty 30

## 2021-11-05 MED ORDER — DIPHENHYDRAMINE HCL 25 MG PO CAPS: 50.0000 mg | ORAL_CAPSULE | Freq: Once | ORAL | Status: DC

## 2021-11-17 NOTE — Progress Notes (Signed)
Cardiology Office Note:    Date:  11/18/2021   ID:  Cindy Long, DOB 17-Oct-1976, MRN [id number]  PCP:  Renford Dills, MD   [facility] Providers Cardiologist:  None     Referring MD: Renford Dills, MD   No chief complaint on file.   History of Present Illness:    Cindy Long is a 46 y.o. female with a hx of hypertension, hyperlipidemia, diabetes, GERD, and Crohn's disease, here to establish care. She was formerly a patient of Dr. Nadara Eaton. She last saw Dr. Nadara Eaton in 2019 for chest pain. She struggled to control her diabetes due to the cost of medications. She had an Echo 08/2018 with LVEF 55% and normal diastolic function. She had an exercise myoview 06/2018. She achieved 7 METs and had no ischemia. Her blood pressure was 206/72 on the stress test. Overall, she is feeling very fatigued and stressed. Her main concern  reports having episodes of right chest pressure/heaviness. This has been ongoing intermittently since 08/2021 - 09/2021, and does not seem to be worse with exertion. Also, she endorses intermittent palpitations since 09/2021. Typically they last for a couple minutes before resolving spontaneously. Today her palpitations have been intermittent all day, but she has been working at her desk and walking at a normal pace. While in bed she can feel her heart racing, and is able to hear her beats in her ears. Her palpitations worsen with exertion and sexual activity. She does not believe her palpitations correlate with her stress, and she denies associated lightheadedness. However, she does have associated shortness of breath with her palpitations and chest pressure, relieved by using her inhaler. Additionally, she suffers from chronic nausea due to Crohn's disease. Her nausea is somewhat worse during these episodes of chest pressure/palpitations as well. Her GI noted she was slightly anemic in 09/2021. Her exercise consists of her frequent walking around the office. She used to  drink tea frequently, but is now trying to stay hydrated with water. She avoids coffee. She has never been a smoker. She denies any headaches, syncope, orthopnea, PND, lower extremity edema or exertional symptoms.   Past Medical History:  Diagnosis Date   Chest pressure 11/18/2021   Crohn disease (HCC)    Diabetes mellitus    IDDM   Essential hypertension 11/18/2021   GERD (gastroesophageal reflux disease)    no meds currently   High cholesterol    Hypertension    Mastitis    right breast   Postpartum care following cesarean delivery (2/10) 12/17/2013   Pure hypercholesterolemia 11/18/2021    Past Surgical History:  Procedure Laterality Date   CERVICAL CERCLAGE     CERVICAL CERCLAGE N/A 06/21/2013   Procedure: McDonald CERCLAGE CERVICAL;  Surgeon: Serita Kyle, MD;  Location: [facility];  Service: Gynecology;  Laterality: N/A;   CESAREAN SECTION     CESAREAN SECTION N/A 12/17/2013   Procedure: Repeat CESAREAN SECTION with Cerclage Removal;  Surgeon: Serita Kyle, MD;  Location: [facility];  Service: Obstetrics;  Laterality: N/A;  EDD: 12/22/13   CHOLECYSTECTOMY     CHOLECYSTECTOMY OPEN  08/2011   LAPAROSCOPIC ENDOMETRIOSIS FULGURATION  01/2011    Current Medications: Current Meds  Medication Sig   albuterol (PROVENTIL HFA;VENTOLIN HFA) 108 (90 Base) MCG/ACT inhaler Inhale 1-2 puffs into the lungs every 6 (six) hours as needed for wheezing or shortness of breath.   atorvastatin (LIPITOR) 10 MG tablet TAKE 1 TABLET BY MOUTH EVERY EVENING AT BEDTIME   Bacillus Coagulans-Inulin (  PROBIOTIC) 1-250 BILLION-MG CAPS Take 1 capsule by mouth daily.   Continuous Blood Gluc Receiver (DEXCOM G6 RECEIVER) DEVI USE AS DIRECTED   Continuous Blood Gluc Transmit (DEXCOM G6 TRANSMITTER) MISC CHANGE TRANSMITTER ONCE EVERY 90 DAYS   dapagliflozin propanediol (FARXIGA) 10 MG TABS tablet Take 1 tablet by mouth once a day   hyoscyamine (LEVBID) 0.375 MG 12 hr tablet Take 1 tablet (0.375 mg total) by  mouth daily as needed.   inFLIXimab (REMICADE) 100 MG injection every 8 (eight) weeks.   Insulin Pen Needle 32G X 4 MM MISC by Does not apply route. BD U/F Pen Needles Nano 4 mm x 32 G; use with insulin   insulin regular human CONCENTRATED (HUMULIN R U-500 KWIKPEN) 500 UNIT/ML kwikpen Inject 90 units under the skin at lunch and 40 units at evening meal   LORazepam (ATIVAN) 1 MG tablet Take 0.5 mg by mouth daily as needed.   losartan (COZAAR) 50 MG tablet Take 50 mg by mouth daily.   medroxyPROGESTERone (DEPO-PROVERA) 150 MG/ML injection Inject 150 mg into the muscle every 3 (three) months.   MELATONIN PO Take 20 capsules by mouth daily.   metoprolol tartrate (LOPRESSOR) 100 MG tablet Take 1 tablet by mouth 2 hours prior to CT   nystatin-triamcinolone (MYCOLOG II) cream APPLY TO THE AFFECTED AREA(S) TWO TIMES DAILY AS DIRECTED   Semaglutide,0.25 or 0.5MG /DOS, (OZEMPIC, 0.25 OR 0.5 MG/DOSE,) 2 MG/1.5ML SOPN Inject 0.25 mg once a week for 4 weeks then increase to 0.5 mg once a week subcutaneously   spironolactone (ALDACTONE) 50 MG tablet Take 50 mg by mouth daily.   [DISCONTINUED] HUMULIN R U-500 KWIKPEN 500 UNIT/ML kwikpen Inject into the skin See admin instructions. Inject 90 at lunch and 50 at dinner   [DISCONTINUED] ibuprofen (ADVIL) 600 MG tablet Take 1 tablet (600 mg total) by mouth every 6 (six) hours as needed.     Allergies:   Metronidazole and Shellfish allergy   Social History   Socioeconomic History   Marital status: Married    Spouse name: Not on file   Number of children: Not on file   Years of education: Not on file   Highest education level: Not on file  Occupational History   Not on file  Tobacco Use   Smoking status: Never   Smokeless tobacco: Never  Substance and Sexual Activity   Alcohol use: Yes    Comment: occasionally   Drug use: No   Sexual activity: Not on file  Other Topics Concern   Not on file  Social History Narrative   Not on file   Social  Determinants of Health   Financial Resource Strain: Low Risk    Difficulty of Paying Living Expenses: Not hard at all  Food Insecurity: No Food Insecurity   Worried About Running Out of Food in the Last Year: Never true   Ran Out of Food in the Last Year: Never true  Transportation Needs: No Transportation Needs   Lack of Transportation (Medical): No   Lack of Transportation (Non-Medical): No  Physical Activity: Inactive   Days of Exercise per Week: 0 days   Minutes of Exercise per Session: 0 min  Stress: Not on file  Social Connections: Not on file     Family History: The patient's family history includes Cancer in her maternal grandmother; Diabetes in her maternal aunt and maternal grandmother; Heart attack (age of onset: 50) in her cousin; Hypertension in her maternal grandfather, maternal grandmother, and mother.  ROS:  Please see the history of present illness.    (+) Palpitations (+) Chest pressure/heaviness (+) Nausea (+) Shortness of breath (+) Fatigue (+) Stress All other systems reviewed and are negative.  EKGs/Labs/Other Studies Reviewed:    The following studies were reviewed today:  Bilateral LE Doppler 07/29/2021: Summary:  Right: Resting right ankle-brachial index is within normal range. No  evidence of significant right lower extremity arterial disease. The right  toe-brachial index is normal.   Left: Resting left ankle-brachial index is within normal range. No  evidence of significant left lower extremity arterial disease. The left  toe-brachial index is normal.   CT-A Chest 07/23/2017: FINDINGS: Cardiovascular: Satisfactory opacification of the pulmonary arteries to the segmental level. No evidence of pulmonary embolism. Normal heart size. No pericardial effusion.   Mediastinum/Nodes: No enlarged mediastinal, hilar, or axillary lymph nodes. Thyroid gland, trachea, and esophagus demonstrate no significant findings.   Lungs/Pleura: Lungs are clear.  No pleural effusion or pneumothorax.   Upper Abdomen: No acute abnormality.   Musculoskeletal: No chest wall abnormality. No acute or significant osseous findings.   Review of the MIP images confirms the above findings.   IMPRESSION: Negative for acute pulmonary embolus.  Clear lung fields.  EKG:    11/18/2021: Sinus rhythm. Rate 89 bpm. Low voltage. LAFB.  Recent Labs: 07/16/2021: ALT 17; BUN 7; Creatinine, Ser 0.80; Hemoglobin 15.5; Platelets 264.0; Potassium 3.8; Sodium 135; TSH 1.09   Recent Lipid Panel No results found for: CHOL, TRIG, HDL, CHOLHDL, VLDL, LDLCALC, LDLDIRECT   Physical Exam:    Wt Readings from Last 3 Encounters:  11/18/21 155 lb 8 oz (70.5 kg)  11/05/21 146 lb (66.2 kg)  10/21/21 151 lb (68.5 kg)     VS:  BP 116/76 (BP Location: Right Arm, Patient Position: Sitting, Cuff Size: Normal)    Pulse 89    Ht 5\' 6"  (1.676 m)    Wt 155 lb 8 oz (70.5 kg)    BMI 25.10 kg/m  , BMI Body mass index is 25.1 kg/m. GENERAL:  Well appearing HEENT: Pupils equal round and reactive, fundi not visualized, oral mucosa unremarkable NECK:  No jugular venous distention, waveform within normal limits, carotid upstroke brisk and symmetric, no bruits, no thyromegaly LUNGS:  Clear to auscultation bilaterally HEART:  RRR.  PMI not displaced or sustained,S1 and S2 within normal limits, no S3, no S4, no clicks, no rubs, no murmurs ABD:  Flat, positive bowel sounds normal in frequency in pitch, no bruits, no rebound, no guarding, no midline pulsatile mass, no hepatomegaly, no splenomegaly EXT:  2 plus pulses throughout, no edema, no cyanosis no clubbing SKIN:  No rashes no nodules NEURO:  Cranial nerves II through XII grossly intact, motor grossly intact throughout PSYCH:  Cognitively intact, oriented to person place and time   ASSESSMENT:    1. Chest pressure   2. Palpitations   3. Essential hypertension   4. Pure hypercholesterolemia    PLAN:    Chest pressure Chest  pressure, often at rest. Symptoms aren't really with exerion. She has nausea but not necessarily with the chest pressue.  Her symptoms seem to be nonischemic.  However she has risk factors including hyperlipidemia, diabetes, and hypertension.  She also has a family member who recently had premature CAD.  We will get a coronary CTA to assess for obstructive coronary disease.  Palpitations Increasing in frequency.  It is worse when laying in bed at night and with physical activity.  There were no arrhythmias  on exam today.  She notes that her hemoglobin has dropped and she was mildly anemic when she saw GI.  In September her hemoglobin actually was 15.  Repeat a CBC.  Check a BMP, magnesium, and TSH.  We will also have her wear a 3-day ZIO monitor.  Essential hypertension Blood pressure.is well-controlled on losartan and spironolactone.   Pure hypercholesterolemia Continue atorvastatin.   Disposition: FU with APP in 2 months. FU with Kaetlyn Noa C. Duke Salvia, MD, [facility] in 6 months.  Medication Adjustments/Labs and Tests Ordered: Current medicines are reviewed at length with the patient today.  Concerns regarding medicines are outlined above.   Orders Placed This Encounter  Procedures   CT CORONARY MORPH W/CTA COR W/SCORE W/CA W/CM &/OR WO/CM   CBC with Differential/Platelet   T4, free   TSH   Magnesium   Basic metabolic panel   LONG TERM MONITOR (3-14 DAYS)   EKG 12-Lead   Meds ordered this encounter  Medications   metoprolol tartrate (LOPRESSOR) 100 MG tablet    Sig: Take 1 tablet by mouth 2 hours prior to CT    Dispense:  1 tablet    Refill:  0   Patient Instructions  Medication Instructions:  TAKE METOPROLOL 100 MG 1 TABLET 2 HOURS PRIOR TO CT   *If you need a refill on your cardiac medications before your next appointment, please call your pharmacy*  Lab Work: BMET/TSH/FT4/MAGNESIUM/CBC TODAY  If you have labs (blood work) drawn today and your tests are completely normal,  you will receive your results only by: MyChart Message (if you have MyChart) OR A paper copy in the mail If you have any lab test that is abnormal or we need to change your treatment, we will call you to review the results.  Testing/Procedures: Your physician has requested that you have cardiac CT. Cardiac computed tomography (CT) is a painless test that uses an x-ray machine to take clear, detailed pictures of your heart. For further information please visit [email or web address] Please follow instruction sheet as given. ONCE INSURANCE HAS BEEN REVIEWED THE OFFICE WILL CALL YOU TO ARRANGE   3 DAY ZIO MONITOR   Follow-Up: At [facility], you and your health needs are our priority.  As part of our continuing mission to provide you with exceptional heart care, we have created designated Provider Care Teams.  These Care Teams include your primary Cardiologist (physician) and Advanced Practice Providers (APPs -  Physician Assistants and Nurse Practitioners) who all work together to provide you with the care you need, when you need it.  We recommend signing up for the patient portal called "MyChart".  Sign up information is provided on this After Visit Summary.  MyChart is used to connect with patients for Virtual Visits (Telemedicine).  Patients are able to view lab/test results, encounter notes, upcoming appointments, etc.  Non-urgent messages can be sent to your provider as well.   To learn more about what you can do with MyChart, go to [email or web address].    Your next appointment:   6 month(s)  The format for your next appointment:   In Person  Provider:   Chilton Si, MD  Your physician recommends that you schedule a follow-up appointment in: 2 MONTHS WITH CAITLIN W NP    Other Instructions    Your cardiac CT will be scheduled at one of the below locations:   [facility] [address] [place], [place] [address] [phone]  OR  [facility] [address] [place], [place] [address] [phone]  If scheduled at [facility], please arrive at the [facility] main entrance (entrance A) of [facility] 30 minutes prior to test start time. You can use the FREE valet parking offered at the main entrance (encouraged to control the heart rate for the test) Proceed to the [facility] Radiology Department (first floor) to check-in and test prep.  If scheduled at [facility], please arrive 15 mins early for check-in and test prep.  Please follow these instructions carefully (unless otherwise directed):  Hold all erectile dysfunction medications at least 3 days (72 hrs) prior to test.  On the Night Before the Test: Be sure to Drink plenty of water. Do not consume any caffeinated/decaffeinated beverages or chocolate 12 hours prior to your test. Do not take any antihistamines 12 hours prior to your test. If the patient has contrast allergy: Patient will need a prescription for Prednisone and very clear instructions (as follows): Prednisone 50 mg - take 13 hours prior to test Take another Prednisone 50 mg 7 hours prior to test Take another Prednisone 50 mg 1 hour prior to test Take Benadryl 50 mg 1 hour prior to test Patient must complete all four doses of above prophylactic medications. Patient will need a ride after test due to Benadryl.  On the Day of the Test: Drink plenty of water until 1 hour prior to the test. Do not eat any food 4 hours prior to the test. You may take your regular medications prior to the test.  Take metoprolol (Lopressor) two hours prior to test. HOLD Furosemide/Hydrochlorothiazide morning of the test. FEMALES- please wear underwire-free bra if available, avoid dresses & tight clothing      After the Test: Drink plenty of water. After receiving IV contrast, you may experience a mild flushed feeling. This is normal. On  occasion, you may experience a mild rash up to 24 hours after the test. This is not dangerous. If this occurs, you can take Benadryl 25 mg and increase your fluid intake. If you experience trouble breathing, this can be serious. If it is severe call 911 IMMEDIATELY. If it is mild, please call our office. If you take any of these medications: Glipizide/Metformin, Avandament, Glucavance, please do not take 48 hours after completing test unless otherwise instructed.  Please allow 2-4 weeks for scheduling of routine cardiac CTs. Some insurance companies require a pre-authorization which may delay scheduling of this test.   For non-scheduling related questions, please contact the cardiac imaging nurse navigator should you have any questions/concerns: Rockwell Alexandria, Cardiac Imaging Nurse Navigator Larey Brick, Cardiac Imaging Nurse Navigator [facility] Direct Office Dial: [phone]   For scheduling needs, including cancellations and rescheduling, please call [place], [phone].     I,Mathew Stumpf,acting as a Neurosurgeon for Chilton Si, MD.,have documented all relevant documentation on the behalf of Chilton Si, MD,as directed by  Chilton Si, MD while in the presence of Chilton Si, MD.  I, Katelen Luepke C. Duke Salvia, MD have reviewed all documentation for this visit.  The documentation of the exam, diagnosis, procedures, and orders on 11/18/2021 are all accurate and complete.   Signed, Chilton Si, MD  11/18/2021 4:23 PM    [facility]

## 2021-11-18 ENCOUNTER — Other Ambulatory Visit: Payer: Self-pay

## 2021-11-18 ENCOUNTER — Encounter (HOSPITAL_BASED_OUTPATIENT_CLINIC_OR_DEPARTMENT_OTHER): Payer: Self-pay | Admitting: Cardiovascular Disease

## 2021-11-18 ENCOUNTER — Other Ambulatory Visit (HOSPITAL_COMMUNITY): Payer: Self-pay

## 2021-11-18 ENCOUNTER — Ambulatory Visit (INDEPENDENT_AMBULATORY_CARE_PROVIDER_SITE_OTHER): Payer: 59

## 2021-11-18 ENCOUNTER — Ambulatory Visit (HOSPITAL_BASED_OUTPATIENT_CLINIC_OR_DEPARTMENT_OTHER): Payer: 59 | Admitting: Cardiovascular Disease

## 2021-11-18 DIAGNOSIS — R0789 Other chest pain: Secondary | ICD-10-CM | POA: Insufficient documentation

## 2021-11-18 DIAGNOSIS — E7800 Pure hypercholesterolemia, unspecified: Secondary | ICD-10-CM | POA: Diagnosis not present

## 2021-11-18 DIAGNOSIS — I10 Essential (primary) hypertension: Secondary | ICD-10-CM | POA: Diagnosis not present

## 2021-11-18 DIAGNOSIS — R002 Palpitations: Secondary | ICD-10-CM

## 2021-11-18 HISTORY — DX: Essential (primary) hypertension: I10

## 2021-11-18 HISTORY — DX: Pure hypercholesterolemia, unspecified: E78.00

## 2021-11-18 HISTORY — DX: Other chest pain: R07.89

## 2021-11-18 MED ORDER — METOPROLOL TARTRATE 100 MG PO TABS
ORAL_TABLET | ORAL | 0 refills | Status: DC
  Filled 2021-11-18: qty 1, 1d supply, fill #0

## 2021-11-18 NOTE — Assessment & Plan Note (Addendum)
Chest pressure, often at rest. Symptoms aren't really with exerion. She has nausea but not necessarily with the chest pressue.  Her symptoms seem to be nonischemic.  However she has risk factors including hyperlipidemia, diabetes, and hypertension.  She also has a family member who recently had premature CAD.  We will get a coronary CTA to assess for obstructive coronary disease.

## 2021-11-18 NOTE — Patient Instructions (Signed)
Medication Instructions:  TAKE METOPROLOL 100 MG 1 TABLET 2 HOURS PRIOR TO CT   *If you need a refill on your cardiac medications before your next appointment, please call your pharmacy*  Lab Work: BMET/TSH/FT4/MAGNESIUM/CBC TODAY  If you have labs (blood work) drawn today and your tests are completely normal, you will receive your results only by: [place] (if you have MyChart) OR A paper copy in the mail If you have any lab test that is abnormal or we need to change your treatment, we will call you to review the results.  Testing/Procedures: Your physician has requested that you have cardiac CT. Cardiac computed tomography (CT) is a painless test that uses an x-ray machine to take clear, detailed pictures of your heart. For further information please visit [email or web address]. Please follow instruction sheet as given. ONCE INSURANCE HAS BEEN REVIEWED THE OFFICE WILL CALL YOU TO ARRANGE   3 DAY ZIO MONITOR   Follow-Up: At [facility], you and your health needs are our priority.  As part of our continuing mission to provide you with exceptional heart care, we have created designated Provider Care Teams.  These Care Teams include your primary Cardiologist (physician) and Advanced Practice Providers (APPs -  Physician Assistants and Nurse Practitioners) who all work together to provide you with the care you need, when you need it.  We recommend signing up for the patient portal called "MyChart".  Sign up information is provided on this After Visit Summary.  MyChart is used to connect with patients for Virtual Visits (Telemedicine).  Patients are able to view lab/test results, encounter notes, upcoming appointments, etc.  Non-urgent messages can be sent to your provider as well.   To learn more about what you can do with MyChart, go to [email or web address].    Your next appointment:   6 month(s)  The format for your next appointment:   In Person  Provider:   Skeet Latch, MD  Your physician recommends that you schedule a follow-up appointment in: [place] NP    Other Instructions    Your cardiac CT will be scheduled at one of the below locations:   [facility] [address] [place], [place] [address] [phone]  [place] [address] [place], [place] [address] [phone]  If scheduled at [facility], please arrive at the [address] [facility] main entrance (entrance A) of [facility] 30 minutes prior to test start time. You can use the FREE valet parking offered at the main entrance (encouraged to control the heart rate for the test) Proceed to the [facility] Radiology Department (first floor) to check-in and test prep.  If scheduled at [facility], please arrive 15 mins early for check-in and test prep.  Please follow these instructions carefully (unless otherwise directed):  Hold all erectile dysfunction medications at least 3 days (72 hrs) prior to test.  On the Night Before the Test: Be sure to Drink plenty of water. Do not consume any caffeinated/decaffeinated beverages or chocolate 12 hours prior to your test. Do not take any antihistamines 12 hours prior to your test. If the patient has contrast allergy: Patient will need a prescription for Prednisone and very clear instructions (as follows): Prednisone 50 mg - take 13 hours prior to test Take another Prednisone 50 mg 7 hours prior to test Take another Prednisone 50 mg 1 hour prior to test Take Benadryl 50 mg 1  hour prior to test Patient must complete all four doses of above prophylactic medications. Patient will need a ride after test due to Benadryl.  On the Day of the Test: Drink plenty of water until 1 hour prior to the test. Do not eat any food 4 hours prior to the test. You may take your regular medications prior to the test.  Take metoprolol  (Lopressor) two hours prior to test. HOLD Furosemide/Hydrochlorothiazide morning of the test. FEMALES- please wear underwire-free bra if available, avoid dresses & tight clothing      After the Test: Drink plenty of water. After receiving IV contrast, you may experience a mild flushed feeling. This is normal. On occasion, you may experience a mild rash up to 24 hours after the test. This is not dangerous. If this occurs, you can take Benadryl 25 mg and increase your fluid intake. If you experience trouble breathing, this can be serious. If it is severe call 911 IMMEDIATELY. If it is mild, please call our office. If you take any of these medications: Glipizide/Metformin, Avandament, Glucavance, please do not take 48 hours after completing test unless otherwise instructed.  Please allow 2-4 weeks for scheduling of routine cardiac CTs. Some insurance companies require a pre-authorization which may delay scheduling of this test.   For non-scheduling related questions, please contact the cardiac imaging nurse navigator should you have any questions/concerns: Marchia Bond, Cardiac Imaging Nurse Navigator Gordy Clement, Cardiac Imaging Nurse Navigator [place] [facility] Direct Office Dial: [phone]   For scheduling needs, including cancellations and rescheduling, please call [place], [phone].

## 2021-11-18 NOTE — Assessment & Plan Note (Signed)
Continue atorvastatin

## 2021-11-18 NOTE — Assessment & Plan Note (Signed)
Increasing in frequency.  It is worse when laying in bed at night and with physical activity.  There were no arrhythmias on exam today.  She notes that her hemoglobin has dropped and she was mildly anemic when she saw GI.  In September her hemoglobin actually was 15.  Repeat a CBC.  Check a BMP, magnesium, and TSH.  We will also have her wear a 3-day ZIO monitor.

## 2021-11-18 NOTE — Assessment & Plan Note (Signed)
Blood pressure.is well-controlled on losartan and spironolactone.

## 2021-11-19 LAB — BASIC METABOLIC PANEL
BUN/Creatinine Ratio: 12 (ref 9–23)
BUN: 9 mg/dL (ref 6–24)
CO2: 19 mmol/L — ABNORMAL LOW (ref 20–29)
Calcium: 9.3 mg/dL (ref 8.7–10.2)
Chloride: 104 mmol/L (ref 96–106)
Creatinine, Ser: 0.77 mg/dL (ref 0.57–1.00)
Glucose: 224 mg/dL — ABNORMAL HIGH (ref 70–99)
Potassium: 4 mmol/L (ref 3.5–5.2)
Sodium: 140 mmol/L (ref 134–144)
eGFR: 98 mL/min/{1.73_m2} (ref >59)

## 2021-11-19 LAB — CBC WITH DIFFERENTIAL/PLATELET
Basophils Absolute: 0 10*3/uL (ref 0.0–0.2)
Basos: 0 %
EOS (ABSOLUTE): 0.1 10*3/uL (ref 0.0–0.4)
Eos: 1 %
Hematocrit: 44.7 % (ref 34.0–46.6)
Hemoglobin: 15 g/dL (ref 11.1–15.9)
Immature Grans (Abs): 0 10*3/uL (ref 0.0–0.1)
Immature Granulocytes: 0 %
Lymphocytes Absolute: 3.8 10*3/uL — ABNORMAL HIGH (ref 0.7–3.1)
Lymphs: 45 %
MCH: 27.2 pg (ref 26.6–33.0)
MCHC: 33.6 g/dL (ref 31.5–35.7)
MCV: 81 fL (ref 79–97)
Monocytes Absolute: 0.5 10*3/uL (ref 0.1–0.9)
Monocytes: 6 %
Neutrophils Absolute: 4 10*3/uL (ref 1.4–7.0)
Neutrophils: 48 %
Platelets: 278 10*3/uL (ref 150–450)
RBC: 5.52 x10E6/uL — ABNORMAL HIGH (ref 3.77–5.28)
RDW: 13.2 % (ref 11.7–15.4)
WBC: 8.4 10*3/uL (ref 3.4–10.8)

## 2021-11-19 LAB — T4, FREE: Free T4: 1.15 ng/dL (ref 0.82–1.77)

## 2021-11-19 LAB — TSH: TSH: 0.69 u[IU]/mL (ref 0.450–4.500)

## 2021-11-19 LAB — MAGNESIUM: Magnesium: 2.2 mg/dL (ref 1.6–2.3)

## 2021-11-23 ENCOUNTER — Other Ambulatory Visit (HOSPITAL_COMMUNITY): Payer: Self-pay

## 2021-11-24 ENCOUNTER — Other Ambulatory Visit (HOSPITAL_COMMUNITY): Payer: Self-pay

## 2021-11-25 ENCOUNTER — Encounter (HOSPITAL_BASED_OUTPATIENT_CLINIC_OR_DEPARTMENT_OTHER): Payer: Self-pay

## 2021-11-25 ENCOUNTER — Other Ambulatory Visit (HOSPITAL_COMMUNITY): Payer: Self-pay

## 2021-11-25 MED ORDER — HYOSCYAMINE SULFATE ER 0.375 MG PO TB12
0.3750 mg | ORAL_TABLET | Freq: Every day | ORAL | 1 refills | Status: AC | PRN
  Filled 2021-11-25: qty 30, 30d supply, fill #0
  Filled 2022-07-25: qty 30, 30d supply, fill #1

## 2021-11-25 MED ORDER — LORAZEPAM 1 MG PO TABS
ORAL_TABLET | ORAL | 1 refills | Status: DC
  Filled 2021-11-25: qty 20, 40d supply, fill #0
  Filled 2022-03-09: qty 20, 40d supply, fill #1

## 2021-11-25 MED ORDER — PROMETHAZINE HCL 25 MG PO TABS
ORAL_TABLET | ORAL | 1 refills | Status: DC
  Filled 2021-11-25: qty 90, 30d supply, fill #0
  Filled 2022-07-25: qty 90, 30d supply, fill #1

## 2021-11-25 MED ORDER — ESCITALOPRAM OXALATE 20 MG PO TABS
40.0000 mg | ORAL_TABLET | Freq: Every day | ORAL | 3 refills | Status: DC
  Filled 2021-11-25: qty 180, 90d supply, fill #0
  Filled 2022-05-26: qty 180, 90d supply, fill #1
  Filled 2022-11-19: qty 180, 90d supply, fill #2

## 2021-11-26 ENCOUNTER — Telehealth (HOSPITAL_COMMUNITY): Payer: Self-pay | Admitting: *Deleted

## 2021-11-26 NOTE — Telephone Encounter (Signed)
Attempted to call patient regarding upcoming cardiac CT appointment. °Left message on voicemail with name and callback number ° °Eldonna Neuenfeldt RN Navigator Cardiac Imaging °[place] [facility] °[phone] Office °[phone] Cell ° °

## 2021-11-29 ENCOUNTER — Encounter (HOSPITAL_COMMUNITY): Payer: Self-pay

## 2021-11-29 ENCOUNTER — Other Ambulatory Visit: Payer: Self-pay

## 2021-11-29 ENCOUNTER — Ambulatory Visit (HOSPITAL_COMMUNITY)
Admission: RE | Admit: 2021-11-29 | Discharge: 2021-11-29 | Disposition: A | Discharge status: Home / Self Care | Payer: 59 | Source: Ambulatory Visit | Attending: Cardiovascular Disease | Admitting: Cardiovascular Disease

## 2021-11-29 DIAGNOSIS — R0789 Other chest pain: Secondary | ICD-10-CM | POA: Diagnosis not present

## 2021-11-29 DIAGNOSIS — I10 Essential (primary) hypertension: Secondary | ICD-10-CM | POA: Diagnosis not present

## 2021-11-29 DIAGNOSIS — R002 Palpitations: Secondary | ICD-10-CM

## 2021-11-29 DIAGNOSIS — E7800 Pure hypercholesterolemia, unspecified: Secondary | ICD-10-CM | POA: Diagnosis not present

## 2021-11-29 DIAGNOSIS — S43004A Unspecified dislocation of right shoulder joint, initial encounter: Secondary | ICD-10-CM | POA: Diagnosis not present

## 2021-11-29 MED ORDER — METOPROLOL TARTRATE 5 MG/5ML IV SOLN
INTRAVENOUS | Status: AC
  Filled 2021-11-29: qty 20

## 2021-11-29 MED ORDER — SODIUM CHLORIDE 0.9 % IV BOLUS
250.0000 mL | Freq: Once | INTRAVENOUS | Status: AC
  Administered 2021-11-29: 250 mL via INTRAVENOUS

## 2021-11-29 MED ORDER — NITROGLYCERIN 0.4 MG SL SUBL
0.8000 mg | SUBLINGUAL_TABLET | Freq: Once | SUBLINGUAL | Status: AC
  Administered 2021-11-29: 0.8 mg via SUBLINGUAL

## 2021-11-29 MED ORDER — IOHEXOL 350 MG/ML SOLN
95.0000 mL | Freq: Once | INTRAVENOUS | Status: AC | PRN
  Administered 2021-11-29: 95 mL via INTRAVENOUS

## 2021-11-29 MED ORDER — METOPROLOL TARTRATE 5 MG/5ML IV SOLN
10.0000 mg | INTRAVENOUS | Status: DC | PRN
  Administered 2021-11-29: 10 mg via INTRAVENOUS

## 2021-11-29 MED ORDER — NITROGLYCERIN 0.4 MG SL SUBL
SUBLINGUAL_TABLET | SUBLINGUAL | Status: AC
  Filled 2021-11-29: qty 2

## 2021-11-30 ENCOUNTER — Other Ambulatory Visit (HOSPITAL_COMMUNITY): Payer: Self-pay

## 2021-11-30 MED ORDER — SPIRONOLACTONE 50 MG PO TABS
ORAL_TABLET | ORAL | 11 refills | Status: DC
  Filled 2021-11-30: qty 30, 30d supply, fill #0
  Filled 2022-04-12: qty 30, 30d supply, fill #1
  Filled 2022-05-26: qty 30, 30d supply, fill #2
  Filled 2022-07-25: qty 30, 30d supply, fill #3
  Filled 2022-11-19: qty 30, 30d supply, fill #4

## 2021-12-01 ENCOUNTER — Ambulatory Visit: Payer: 59 | Admitting: Internal Medicine

## 2021-12-01 ENCOUNTER — Encounter: Payer: Self-pay | Admitting: Internal Medicine

## 2021-12-01 ENCOUNTER — Other Ambulatory Visit: Payer: Self-pay

## 2021-12-01 ENCOUNTER — Other Ambulatory Visit (HOSPITAL_COMMUNITY): Payer: Self-pay

## 2021-12-01 VITALS — BP 140/76 | HR 96 | Temp 98.2°F | Ht 66.0 in | Wt 155.4 lb

## 2021-12-01 DIAGNOSIS — Z794 Long term (current) use of insulin: Secondary | ICD-10-CM

## 2021-12-01 DIAGNOSIS — I10 Essential (primary) hypertension: Secondary | ICD-10-CM

## 2021-12-01 DIAGNOSIS — E559 Vitamin D deficiency, unspecified: Secondary | ICD-10-CM | POA: Diagnosis not present

## 2021-12-01 DIAGNOSIS — E11618 Type 2 diabetes mellitus with other diabetic arthropathy: Secondary | ICD-10-CM | POA: Diagnosis not present

## 2021-12-01 DIAGNOSIS — F329 Major depressive disorder, single episode, unspecified: Secondary | ICD-10-CM

## 2021-12-01 DIAGNOSIS — K50919 Crohn's disease, unspecified, with unspecified complications: Secondary | ICD-10-CM

## 2021-12-01 DIAGNOSIS — E538 Deficiency of other specified B group vitamins: Secondary | ICD-10-CM

## 2021-12-01 DIAGNOSIS — E785 Hyperlipidemia, unspecified: Secondary | ICD-10-CM

## 2021-12-01 MED ORDER — CYANOCOBALAMIN 1000 MCG/ML IJ SOLN
INTRAMUSCULAR | 6 refills | Status: DC
  Filled 2021-12-01: qty 10, 28d supply, fill #0
  Filled 2022-01-16: qty 2, 28d supply, fill #1
  Filled 2022-01-17 – 2022-02-13 (×2): qty 2, 28d supply, fill #2
  Filled 2022-03-21 – 2022-04-12 (×2): qty 2, 28d supply, fill #3
  Filled 2022-05-26: qty 2, 28d supply, fill #4
  Filled 2022-06-27: qty 2, 28d supply, fill #5
  Filled 2022-09-26: qty 2, 28d supply, fill #6
  Filled 2022-11-19: qty 2, 28d supply, fill #7

## 2021-12-01 MED ORDER — VITAMIN D (ERGOCALCIFEROL) 1.25 MG (50000 UNIT) PO CAPS
50000.0000 [IU] | ORAL_CAPSULE | ORAL | 0 refills | Status: DC
  Filled 2021-12-01: qty 8, 56d supply, fill #0

## 2021-12-01 MED ORDER — SYRINGE/NEEDLE (DISP) 25G X 5/8" 3 ML MISC
3 refills | Status: DC
Start: 2021-12-01 — End: 2023-02-17
  Filled 2021-12-01: qty 21, 365d supply, fill #0
  Filled 2022-01-17 – 2022-09-26 (×2): qty 21, 365d supply, fill #1

## 2021-12-01 NOTE — Patient Instructions (Signed)
  Gluten free trial for 4-6 weeks. OK to use gluten-free bread and gluten-free pasta.    Gluten-Free Diet for Celiac Disease, Adult The gluten-free diet includes all foods that do not contain gluten. Gluten is a protein that is found in wheat, rye, barley, and some other grains. Following the gluten-free diet is the only treatment for people with celiac disease. It helps to prevent damage to the intestines and improves or eliminates the symptoms of celiac disease. Following the gluten-free diet requires some planning. It can be challenging at first, but it gets easier with time and practice. There are more gluten-free options available today than ever before. If you need help finding gluten-free foods or if you have questions, talk with your diet and nutrition specialist (registered dietitian) or your health care provider. What do I need to know about a gluten-free diet?  All fruits, vegetables, and meats are safe to eat and do not contain gluten.  When grocery shopping, start by shopping in the produce, meat, and dairy sections. These sections are more likely to contain gluten-free foods. Then move to the aisles that contain packaged foods if you need to.  Read all food labels. Gluten is often added to foods. Always check the ingredient list and look for warnings, such as "may contain gluten."  Talk with your dietitian or health care provider before taking a gluten-free multivitamin or mineral supplement.  Be aware of gluten-free foods having contact with foods that contain gluten (cross-contamination). This can happen at home and with any processed foods. ? Talk with your health care provider or dietitian about how to reduce the risk of cross-contamination in your home. ? If you have questions about how a food is processed, ask the manufacturer. What key words help to identify gluten? Foods that list any of these key words on the label usually contain gluten:  Wheat, flour, enriched  flour, bromated flour, white flour, durum flour, graham flour, phosphated flour, self-rising flour, semolina, farina, barley (malt), rye, and oats.  Starch, dextrin, modified food starch, or cereal.  Thickening, fillers, or emulsifiers.  Malt flavoring, malt extract, or malt syrup.  Hydrolyzed vegetable protein.  In the [place] packaged foods that are gluten-free are required to be labeled "GF." These foods should be easy to identify and are safe to eat. In the [place] food companies are also required to list common food allergens, including wheat, on their labels. Recommended foods Grains  Amaranth, bean flours, 100% buckwheat flour, corn, millet, nut flours or nut meals, GF oats, quinoa, rice, sorghum, teff, rice wafers, pure cornmeal tortillas, popcorn, and hot cereals made from cornmeal. Hominy, rice, wild rice. Some Asian rice noodles or bean noodles. Arrowroot starch, corn bran, corn flour, corn germ, cornmeal, corn starch, potato flour, potato starch flour, and rice bran. Plain, brown, and sweet rice flours. Rice polish, soy flour, and tapioca starch. Vegetables  All plain fresh, frozen, and canned vegetables. Fruits  All plain fresh, frozen, canned, and dried fruits, and 100% fruit juices. Meats and other protein foods  All fresh beef, pork, poultry, fish, seafood, and eggs. Fish canned in water, oil, brine, or vegetable broth. Plain nuts and seeds, peanut butter. Some lunch meat and some frankfurters. Dried beans, dried peas, and lentils. Dairy  Fresh plain, dry, evaporated, or condensed milk. Cream, butter, sour cream, whipping cream, and most yogurts. Unprocessed cheese, most processed cheeses, some cottage cheese, some cream cheeses. Beverages  Coffee, tea, most herbal teas. Carbonated beverages and some root beers.   Wine, sake, and distilled spirits, such as gin, vodka, and whiskey. Most hard ciders. Fats and oils  Butter, margarine, vegetable oil, hydrogenated butter, olive  oil, shortening, lard, cream, and some mayonnaise. Some commercial salad dressings. Olives. Sweets and desserts  Sugar, honey, some syrups, molasses, jelly, and jam. Plain hard candy, marshmallows, and gumdrops. Pure cocoa powder. Plain chocolate. Custard and some pudding mixes. Gelatin desserts, sorbets, frozen ice pops, and sherbet. Cake, cookies, and other desserts prepared with allowed flours. Some commercial ice creams. Cornstarch, tapioca, and rice puddings. Seasoning and other foods  Some canned or frozen soups. Monosodium glutamate (MSG). Cider, rice, and wine vinegar. Baking soda and baking powder. Cream of tartar. Baking and nutritional yeast. Certain soy sauces made without wheat (ask your dietitian about specific brands that are allowed). Nuts, coconut, and chocolate. Salt, pepper, herbs, spices, flavoring extracts, imitation or artificial flavorings, natural flavorings, and food colorings. Some medicines and supplements. Some lip glosses and other cosmetics. Rice syrups. The items listed may not be a complete list. Talk with your dietitian about what dietary choices are best for you. Foods to avoid Grains  Barley, bran, bulgur, couscous, cracked wheat, , farro, graham, malt, matzo, semolina, wheat germ, and all wheat and rye cereals including spelt and kamut. Cereals containing malt as a flavoring, such as rice cereal. Noodles, spaghetti, macaroni, most packaged rice mixes, and all mixes containing wheat, rye, barley, or triticale. Vegetables  Most creamed vegetables and most vegetables canned in sauces. Some commercially prepared vegetables and salads. Fruits  Thickened or prepared fruits and some pie fillings. Some fruit snacks and fruit roll-ups. Meats and other protein foods  Any meat or meat alternative containing wheat, rye, barley, or gluten stabilizers. These are often marinated or packaged meats and lunch meats. Bread-containing products, such as Swiss steak,  croquettes, meatballs, and meatloaf. Most tuna canned in vegetable broth and turkey with hydrolyzed vegetable protein (HVP) injected as part of the basting. Seitan. Imitation fish. Eggs in sauces made from ingredients to avoid. Dairy  Commercial chocolate milk drinks and malted milk. Some non-dairy creamers. Any cheese product containing ingredients to avoid. Beverages  Certain cereal beverages. Beer, ale, malted milk, and some root beers. Some hard ciders. Some instant flavored coffees. Some herbal teas made with barley or with barley malt added. Fats and oils  Some commercial salad dressings. Sour cream containing modified food starch. Sweets and desserts  Some toffees. Chocolate-coated nuts (may be rolled in wheat flour) and some commercial candies and candy bars. Most cakes, cookies, donuts, pastries, and other baked goods. Some commercial ice cream. Ice cream cones. Commercially prepared mixes for cakes, cookies, and other desserts. Bread pudding and other puddings thickened with flour. Products containing brown rice syrup made with barley malt enzyme. Desserts and sweets made with malt flavoring. Seasoning and other foods  Some curry powders, some dry seasoning mixes, some gravy extracts, some meat sauces, some ketchups, some prepared mustards, and horseradish. Certain soy sauces. Malt vinegar. Bouillon and bouillon cubes that contain HVP. Some chip dips, and some chewing gum. Yeast extract. Brewer's yeast. Caramel color. Some medicines and supplements. Some lip glosses and other cosmetics. The items listed may not be a complete list. Talk with your dietitian about what dietary choices are best for you. Summary  Gluten is a protein that is found in wheat, rye, barley, and some other grains. The gluten-free diet includes all foods that do not contain gluten.  If you need help finding gluten-free foods or if   you have questions, talk with your diet and nutrition specialist (registered  dietitian) or your health care provider.  Read all food labels. Gluten is often added to foods. Always check the ingredient list and look for warnings, such as "may contain gluten." This information is not intended to replace advice given to you by your health care provider. Make sure you discuss any questions you have with your health care provider. Document Released: 10/24/2005 Document Revised: 08/08/2016 Document Reviewed: 08/08/2016 Elsevier Interactive Patient Education  2018 [employer] Inc[employer]   

## 2021-12-01 NOTE — Progress Notes (Signed)
Subjective:  Patient ID: Cindy Long, female    DOB: 1976-10-04  Age: 46 y.o. MRN: [id number]  CC: New Patient (Initial Visit)   HPI Cindy Long presents for a new pt visit.  We need to address multiple medical problems.  She is complaining of anxiety and depression. C/o Crohn's disease x 5 years; she has flareups with severe diarrhea and abdominal pain that happen q 1-2 mo and would last x 3-4 d.  She would have multiple loose stools during the day and at night. She is complaining of DM 2 since high school. She has been on Remicade for her Crohn's disease lately. She was on Humira before.   Highest wt 170   Outpatient Medications Prior to Visit  Medication Sig Dispense Refill   albuterol (PROVENTIL HFA;VENTOLIN HFA) 108 (90 Base) MCG/ACT inhaler Inhale 1-2 puffs into the lungs every 6 (six) hours as needed for wheezing or shortness of breath.     atorvastatin (LIPITOR) 10 MG tablet TAKE 1 TABLET BY MOUTH EVERY EVENING AT BEDTIME 90 tablet 3   Bacillus Coagulans-Inulin (PROBIOTIC) 1-250 BILLION-MG CAPS Take 1 capsule by mouth daily.     Continuous Blood Gluc Receiver (DEXCOM G6 RECEIVER) DEVI USE AS DIRECTED 1 each 0   Continuous Blood Gluc Transmit (DEXCOM G6 TRANSMITTER) MISC CHANGE TRANSMITTER ONCE EVERY 90 DAYS 9 each 4   dapagliflozin propanediol (FARXIGA) 10 MG TABS tablet Take 1 tablet by mouth once a day 30 tablet 5   escitalopram (LEXAPRO) 20 MG tablet TAKE 2 TABLETS BY MOUTH ONCE A DAY. 180 tablet 3   hyoscyamine (LEVBID) 0.375 MG 12 hr tablet Take 1 tablet  by mouth as needed as directed (30 days). 30 tablet 1   inFLIXimab (REMICADE) 100 MG injection every 8 (eight) weeks.     Insulin Pen Needle 32G X 4 MM MISC by Does not apply route. BD U/F Pen Needles Nano 4 mm x 32 G; use with insulin     insulin regular human CONCENTRATED (HUMULIN R U-500 KWIKPEN) 500 UNIT/ML kwikpen Inject 90 units under the skin at lunch and 40 units at evening meal 24 mL 3   LORazepam  (ATIVAN) 1 MG tablet Take 1/2 tablet by mouth once daily as needed 20 tablet 1   losartan (COZAAR) 50 MG tablet Take 50 mg by mouth daily.     medroxyPROGESTERone (DEPO-PROVERA) 150 MG/ML injection Inject 150 mg into the muscle every 3 (three) months.     MELATONIN PO Take 20 capsules by mouth daily.     nystatin-triamcinolone (MYCOLOG II) cream APPLY TO THE AFFECTED AREA(S) TWO TIMES DAILY AS DIRECTED 20 g 2   promethazine (PHENERGAN) 25 MG tablet TAKE 1 TABLET BY MOUTH EVERY 8 HOURS 90 tablet 1   Semaglutide,0.25 or 0.5MG /DOS, (OZEMPIC, 0.25 OR 0.5 MG/DOSE,) 2 MG/1.5ML SOPN Inject 0.25 mg once a week for 4 weeks then increase to 0.5 mg once a week subcutaneously 1.5 mL 11   spironolactone (ALDACTONE) 50 MG tablet Take 1 tablet by mouth every day. 30 tablet 11   LORazepam (ATIVAN) 1 MG tablet Take 0.5 mg by mouth daily as needed.     metoprolol tartrate (LOPRESSOR) 100 MG tablet Take 1 tablet by mouth 2 hours prior to CT 1 tablet 0   spironolactone (ALDACTONE) 50 MG tablet Take 50 mg by mouth daily.     No facility-administered medications prior to visit.    ROS: Review of Systems  Constitutional:  Positive for fatigue and unexpected  weight change. Negative for activity change, appetite change and chills.  HENT:  Positive for congestion and postnasal drip. Negative for mouth sores and sinus pressure.   Eyes:  Negative for visual disturbance.  Respiratory:  Negative for cough and chest tightness.   Gastrointestinal:  Positive for abdominal distention, abdominal pain, blood in stool, diarrhea, nausea and vomiting.  Genitourinary:  Positive for frequency and urgency. Negative for difficulty urinating and vaginal pain.  Musculoskeletal:  Positive for arthralgias. Negative for back pain and gait problem.  Skin:  Negative for pallor and rash.  Neurological:  Negative for dizziness, tremors, weakness, numbness and headaches.  Psychiatric/Behavioral:  Positive for dysphoric mood. Negative for  confusion, sleep disturbance and suicidal ideas. The patient is nervous/anxious.    Objective:  BP 140/76 (BP Location: Left Arm)    Pulse 96    Temp 98.2 F (36.8 C) (Oral)    Ht 5\' 6"  (1.676 m)    Wt 155 lb 6.4 oz (70.5 kg)    SpO2 96%    BMI 25.08 kg/m   BP Readings from Last 3 Encounters:  12/01/21 140/76  11/29/21 (!) 102/55  11/18/21 116/76    Wt Readings from Last 3 Encounters:  12/01/21 155 lb 6.4 oz (70.5 kg)  11/18/21 155 lb 8 oz (70.5 kg)  11/05/21 146 lb (66.2 kg)    Physical Exam Constitutional:      General: She is not in acute distress.    Appearance: She is well-developed. She is obese.  HENT:     Head: Normocephalic.     Right Ear: External ear normal.     Left Ear: External ear normal.     Nose: Nose normal.  Eyes:     General:        Right eye: No discharge.        Left eye: No discharge.     Conjunctiva/sclera: Conjunctivae normal.     Pupils: Pupils are equal, round, and reactive to light.  Neck:     Thyroid: No thyromegaly.     Vascular: No JVD.     Trachea: No tracheal deviation.  Cardiovascular:     Rate and Rhythm: Normal rate and regular rhythm.     Heart sounds: Normal heart sounds.  Pulmonary:     Effort: No respiratory distress.     Breath sounds: No stridor. No wheezing.  Abdominal:     General: Bowel sounds are normal. There is no distension.     Palpations: Abdomen is soft. There is no mass.     Tenderness: There is no abdominal tenderness. There is no guarding or rebound.  Musculoskeletal:        General: No tenderness.     Cervical back: Normal range of motion and neck supple. No rigidity.  Lymphadenopathy:     Cervical: No cervical adenopathy.  Skin:    Findings: No erythema or rash.  Neurological:     Cranial Nerves: No cranial nerve deficit.     Motor: No abnormal muscle tone.     Coordination: Coordination normal.     Gait: Gait normal.     Deep Tendon Reflexes: Reflexes normal.  Psychiatric:        Behavior: Behavior  normal.        Thought Content: Thought content normal.        Judgment: Judgment normal.    Lab Results  Component Value Date   WBC 8.4 11/18/2021   HGB 15.0 11/18/2021   HCT 44.7 11/18/2021  PLT 278 11/18/2021   GLUCOSE 224 (H) 11/18/2021   ALT 17 07/16/2021   AST 11 07/16/2021   NA 140 11/18/2021   K 4.0 11/18/2021   CL 104 11/18/2021   CREATININE 0.77 11/18/2021   BUN 9 11/18/2021   CO2 19 (L) 11/18/2021   TSH 0.690 11/18/2021   HGBA1C 13.7 (H) 07/16/2021    CT CORONARY MORPH W/CTA COR W/SCORE W/CA W/CM &/OR WO/CM  Addendum Date: 11/29/2021   ADDENDUM REPORT: 11/29/2021 18:04 CLINICAL DATA:  Chest pain EXAM: Cardiac/Coronary CTA TECHNIQUE: A non-contrast, gated CT scan was obtained with axial slices of 3 mm through the heart for calcium scoring. Calcium scoring was performed using the Agatston method. A 120 kV prospective, gated, contrast cardiac scan was obtained. Gantry rotation speed was 250 msecs and collimation was 0.6 mm. Two sublingual nitroglycerin tablets (0.8 mg) were given. The 3D data set was reconstructed in 5% intervals of the 35-75% of the R-R cycle. Diastolic phases were analyzed on a dedicated workstation using MPR, MIP, and VRT modes. The patient received 95 cc of contrast. FINDINGS: Image quality: Excellent. Noise artifact is: Limited. Coronary Arteries:  Normal coronary origin.  Right dominance. Left main: The left main is a large caliber vessel with a normal take off from the left coronary cusp that bifurcates to form a left anterior descending artery and a left circumflex artery. There is no plaque or stenosis. Left anterior descending artery: There is minimal mixed density plaque in the proximal LAD (<25%). The mid and distal segments are patent. The LAD gives off 1 patent diagonal branch. Left circumflex artery: The LCX is non-dominant and patent with no evidence of plaque or stenosis. The LCX gives off 2 patent obtuse marginal branches. Right coronary artery:  The RCA is dominant with normal take off from the right coronary cusp. There is no evidence of plaque or stenosis. The RCA terminates as a PDA and right posterolateral branch without evidence of plaque or stenosis. Right Atrium: Right atrial size is within normal limits. Right Ventricle: The right ventricular cavity is within normal limits. Left Atrium: Left atrial size is normal in size with no left atrial appendage filling defect. Left Ventricle: The ventricular cavity size is within normal limits. There are no stigmata of prior infarction. A small diverticulum is present in the basal inferior segment. Pulmonary arteries: Normal in size without proximal filling defect. Pulmonary veins: Normal pulmonary venous drainage. Pericardium: Normal thickness with no significant effusion or calcium present. Cardiac valves: The aortic valve is trileaflet without significant calcification. The mitral valve is normal structure without significant calcification. Aorta: Normal caliber with no significant disease. Extra-cardiac findings: See attached radiology report for non-cardiac structures. IMPRESSION: 1. Coronary calcium score of 1.6. This was 92nd percentile for age-, sex, and race-matched controls. 2. Normal coronary origin with right dominance. 3. Minimal mixed density plaque (<25%) in the proximal LAD. 4. Small diverticulum in the basal inferior segment. Suspect this is benign finding. RECOMMENDATIONS: 1. Minimal non-obstructive CAD (0-24%). Consider non-atherosclerotic causes of chest pain. Consider preventive therapy and risk factor modification. Eleonore Chiquito, MD Electronically Signed   By: Eleonore Chiquito M.D.   On: 11/29/2021 18:04   Result Date: 11/29/2021 EXAM: OVER-READ INTERPRETATION  CT CHEST The following report is an over-read performed by radiologist Dr. Rolm Baptise of [facility], [place] on 11/29/2021. This over-read does not include interpretation of cardiac or coronary anatomy or pathology. The  coronary CTA interpretation by the cardiologist is attached. COMPARISON:  07/23/2017 FINDINGS:  Vascular: Heart is normal size.  Aorta normal caliber. Mediastinum/Nodes: No adenopathy Lungs/Pleura: No confluent opacity or effusion. Upper Abdomen: Imaging into the upper abdomen demonstrates no acute findings. Musculoskeletal: Chest wall soft tissues are unremarkable. No acute bony abnormality. IMPRESSION: No acute or significant extracardiac abnormality. Electronically Signed: By: Rolm Baptise M.D. On: 11/29/2021 17:31    Assessment & Plan:   Problem List Items Addressed This Visit     B12 deficiency    Restart vitamin B12      Crohn's disease ([place]) - Primary    Poorly controlled Crohn's disease x 5 years; she has flareups with severe diarrhea and abdominal pain that happen q 1-2 mo and would last x 3-4 d.  She would have multiple loose stools during the day and at night. Follow-up with GI. She has been on Remicade for her Crohn's disease lately. She was on Humira before. Try gluten-free diet. Improved compliance with vitamin B-12 and vitamin D intake      Depression    Likely related to multiple medical issues.  On Lexapro.  Lorazepam as needed  Potential benefits of a long term benzodiazepines  use as well as potential risks  and complications were explained to the patient and were aknowledged.      Dyslipidemia    On Lipitor      Essential hypertension    Follow-up with Dr. Oval Linsey Continue on spironolactone, losartan, Farxiga      Type II diabetes mellitus ([place])    Continue on Farxiga, Ozempic, insulin Monitor A1c      Vitamin D deficiency    Restart vitamin D         Meds ordered this encounter  Medications   Vitamin D, Ergocalciferol, (DRISDOL) 1.25 MG (50000 UNIT) CAPS capsule    Sig: Take 1 capsule (50,000 Units total) by mouth every 7 (seven) days.    Dispense:  8 capsule    Refill:  0   cyanocobalamin (,VITAMIN B-12,) 1000 MCG/ML injection    Sig: Inject 1  ml (1000 mcg) subcutaneously daily for 1 week, then weekly for 1 month, then once every 2 weeks    Dispense:  10 mL    Refill:  6   SYRINGE-NEEDLE, DISP, 3 ML 25G X 5/8" 3 ML MISC    Sig: Use to inject B12 under the skin    Dispense:  50 each    Refill:  3      Follow-up: Return in about 6 weeks (around 01/12/2022) for a follow-up visit.  Walker Kehr, MD

## 2021-12-06 ENCOUNTER — Encounter (HOSPITAL_BASED_OUTPATIENT_CLINIC_OR_DEPARTMENT_OTHER): Payer: Self-pay | Admitting: Cardiovascular Disease

## 2021-12-13 DIAGNOSIS — K5090 Crohn's disease, unspecified, without complications: Secondary | ICD-10-CM | POA: Insufficient documentation

## 2021-12-13 DIAGNOSIS — E538 Deficiency of other specified B group vitamins: Secondary | ICD-10-CM | POA: Insufficient documentation

## 2021-12-13 DIAGNOSIS — E559 Vitamin D deficiency, unspecified: Secondary | ICD-10-CM | POA: Insufficient documentation

## 2021-12-13 DIAGNOSIS — F32A Depression, unspecified: Secondary | ICD-10-CM | POA: Insufficient documentation

## 2021-12-13 NOTE — Assessment & Plan Note (Signed)
Restart vitamin B12 

## 2021-12-13 NOTE — Assessment & Plan Note (Signed)
Restart vitamin D 

## 2021-12-13 NOTE — Assessment & Plan Note (Signed)
Poorly controlled Crohn's disease x 5 years; she has flareups with severe diarrhea and abdominal pain that happen q 1-2 mo and would last x 3-4 d.  She would have multiple loose stools during the day and at night. Follow-up with GI. She has been on Remicade for her Crohn's disease lately. She was on Humira before. Try gluten-free diet. Improved compliance with vitamin B-12 and vitamin D intake

## 2021-12-13 NOTE — Assessment & Plan Note (Addendum)
Continue on Farxiga, Ozempic, insulin Monitor A1c

## 2021-12-13 NOTE — Assessment & Plan Note (Signed)
Follow-up with Dr. Oval Linsey Continue on spironolactone, losartan, [place]ade

## 2021-12-13 NOTE — Assessment & Plan Note (Signed)
On Lipitor 

## 2021-12-13 NOTE — Assessment & Plan Note (Signed)
Likely related to multiple medical issues.  On Lexapro.  Lorazepam as needed  Potential benefits of a long term benzodiazepines  use as well as potential risks  and complications were explained to the patient and were aknowledged.

## 2021-12-17 ENCOUNTER — Telehealth: Payer: Self-pay

## 2021-12-17 NOTE — Telephone Encounter (Signed)
Patient would like to try the Prandin for my diabetes and he can send rx to [place].  Thanks!

## 2021-12-19 MED ORDER — REPAGLINIDE 1 MG PO TABS
1.0000 mg | ORAL_TABLET | Freq: Three times a day (TID) | ORAL | 1 refills | Status: DC
  Filled 2021-12-19: qty 270, 90d supply, fill #0
  Filled 2022-05-26: qty 270, 90d supply, fill #1

## 2021-12-19 NOTE — Telephone Encounter (Signed)
OK. Thx

## 2021-12-20 ENCOUNTER — Emergency Department (HOSPITAL_COMMUNITY): Payer: 59

## 2021-12-20 ENCOUNTER — Other Ambulatory Visit (HOSPITAL_COMMUNITY): Payer: Self-pay

## 2021-12-20 ENCOUNTER — Emergency Department (HOSPITAL_COMMUNITY)
Admission: EM | Admit: 2021-12-20 | Discharge: 2021-12-20 | Disposition: A | Discharge status: Home / Self Care | Payer: 59 | Attending: Emergency Medicine | Admitting: Emergency Medicine

## 2021-12-20 ENCOUNTER — Encounter (HOSPITAL_COMMUNITY): Payer: Self-pay | Admitting: *Deleted

## 2021-12-20 DIAGNOSIS — R109 Unspecified abdominal pain: Secondary | ICD-10-CM | POA: Diagnosis not present

## 2021-12-20 DIAGNOSIS — K529 Noninfective gastroenteritis and colitis, unspecified: Secondary | ICD-10-CM | POA: Diagnosis not present

## 2021-12-20 DIAGNOSIS — R1110 Vomiting, unspecified: Secondary | ICD-10-CM | POA: Diagnosis not present

## 2021-12-20 DIAGNOSIS — I700 Atherosclerosis of aorta: Secondary | ICD-10-CM | POA: Diagnosis not present

## 2021-12-20 LAB — COMPREHENSIVE METABOLIC PANEL
ALT: 21 U/L (ref 0–44)
AST: 14 U/L — ABNORMAL LOW (ref 15–41)
Albumin: 4.3 g/dL (ref 3.5–5.0)
Alkaline Phosphatase: 71 U/L (ref 38–126)
Anion gap: 13 (ref 5–15)
BUN: 9 mg/dL (ref 6–20)
CO2: 21 mmol/L — ABNORMAL LOW (ref 22–32)
Calcium: 9.5 mg/dL (ref 8.9–10.3)
Chloride: 103 mmol/L (ref 98–111)
Creatinine, Ser: 0.55 mg/dL (ref 0.44–1.00)
GFR, Estimated: >60 mL/min (ref >60)
Glucose, Bld: 340 mg/dL — ABNORMAL HIGH (ref 70–99)
Potassium: 4.3 mmol/L (ref 3.5–5.1)
Sodium: 137 mmol/L (ref 135–145)
Total Bilirubin: 1.6 mg/dL — ABNORMAL HIGH (ref 0.3–1.2)
Total Protein: 8 g/dL (ref 6.5–8.1)

## 2021-12-20 LAB — HCG, QUANTITATIVE, PREGNANCY: hCG, Beta Chain, Quant, S: <1 m[IU]/mL (ref <5)

## 2021-12-20 LAB — CBC WITH DIFFERENTIAL/PLATELET
Abs Immature Granulocytes: 0.05 10*3/uL (ref 0.00–0.07)
Basophils Absolute: 0 10*3/uL (ref 0.0–0.1)
Basophils Relative: 0 %
Eosinophils Absolute: 0.1 10*3/uL (ref 0.0–0.5)
Eosinophils Relative: 1 %
HCT: 49.6 % — ABNORMAL HIGH (ref 36.0–46.0)
Hemoglobin: 16.1 g/dL — ABNORMAL HIGH (ref 12.0–15.0)
Immature Granulocytes: 0 %
Lymphocytes Relative: 13 %
Lymphs Abs: 1.7 10*3/uL (ref 0.7–4.0)
MCH: 26.7 pg (ref 26.0–34.0)
MCHC: 32.5 g/dL (ref 30.0–36.0)
MCV: 82.4 fL (ref 80.0–100.0)
Monocytes Absolute: 0.8 10*3/uL (ref 0.1–1.0)
Monocytes Relative: 6 %
Neutro Abs: 10.7 10*3/uL — ABNORMAL HIGH (ref 1.7–7.7)
Neutrophils Relative %: 80 %
Platelets: 247 10*3/uL (ref 150–400)
RBC: 6.02 MIL/uL — ABNORMAL HIGH (ref 3.87–5.11)
RDW: 12.4 % (ref 11.5–15.5)
WBC: 13.3 10*3/uL — ABNORMAL HIGH (ref 4.0–10.5)
nRBC: 0 % (ref 0.0–0.2)

## 2021-12-20 MED ORDER — SODIUM CHLORIDE 0.9 % IV BOLUS
2000.0000 mL | Freq: Once | INTRAVENOUS | Status: AC
  Administered 2021-12-20: 2000 mL via INTRAVENOUS

## 2021-12-20 MED ORDER — KETOROLAC TROMETHAMINE 30 MG/ML IJ SOLN
30.0000 mg | Freq: Once | INTRAMUSCULAR | Status: AC
  Administered 2021-12-20: 30 mg via INTRAVENOUS
  Filled 2021-12-20: qty 1

## 2021-12-20 MED ORDER — HYDROCODONE-ACETAMINOPHEN 5-325 MG PO TABS
1.0000 | ORAL_TABLET | Freq: Four times a day (QID) | ORAL | 0 refills | Status: DC | PRN
  Filled 2021-12-20: qty 20, 5d supply, fill #0

## 2021-12-20 MED ORDER — ONDANSETRON HCL 4 MG/2ML IJ SOLN
4.0000 mg | Freq: Once | INTRAMUSCULAR | Status: AC
  Administered 2021-12-20: 4 mg via INTRAVENOUS
  Filled 2021-12-20: qty 2

## 2021-12-20 MED ORDER — ONDANSETRON 4 MG PO TBDP
ORAL_TABLET | ORAL | 0 refills | Status: DC
  Filled 2021-12-20: qty 12, 3d supply, fill #0

## 2021-12-20 MED ORDER — IOHEXOL 300 MG/ML  SOLN
100.0000 mL | Freq: Once | INTRAMUSCULAR | Status: AC | PRN
  Administered 2021-12-20: 100 mL via INTRAVENOUS

## 2021-12-20 NOTE — ED Triage Notes (Signed)
States she ate a catered meal at lunch to and about and hour after she started vomiting  and also has diarrhea,also c/o abdominal pain

## 2021-12-20 NOTE — Discharge Instructions (Signed)
Drink plenty of fluids.  Follow-up with your family doctor or your GI doctor in the next couple days for recheck.  Return if getting worse

## 2021-12-20 NOTE — ED Provider Notes (Signed)
[facility] EMERGENCY DEPARTMENT Provider Note   CSN: [id number] Arrival date & time: 12/20/21  1840     History  Chief Complaint  Patient presents with   Nausea   Emesis    Cindy Long is a 46 y.o. female.  Patient states that she started with vomiting and diarrhea tonight.  She has had numerous bouts of vomiting and diarrhea without any blood.  Patient has a history of Crohn's disease  The history is provided by the patient and medical records. No language interpreter was used.  Emesis Severity:  Moderate Timing:  Intermittent Quality:  Bilious material Able to tolerate:  Liquids Progression:  Unchanged Chronicity:  New Recent urination:  Increased Relieved by:  Nothing Worsened by:  Nothing Associated symptoms: diarrhea   Associated symptoms: no abdominal pain, no cough and no headaches       Home Medications Prior to Admission medications   Medication Sig Start Date End Date Taking? Authorizing Provider  HYDROcodone-acetaminophen (NORCO/VICODIN) 5-325 MG tablet Take 1 tablet by mouth every 6 (six) hours as needed. 12/20/21  Yes Milton Ferguson, MD  ondansetron (ZOFRAN-ODT) 4 MG disintegrating tablet 4mg  ODT q6 hours prn nausea/vomit 12/20/21  Yes Milton Ferguson, MD  albuterol (PROVENTIL HFA;VENTOLIN HFA) 108 (90 Base) MCG/ACT inhaler Inhale 1-2 puffs into the lungs every 6 (six) hours as needed for wheezing or shortness of breath.    [provider]  atorvastatin (LIPITOR) 10 MG tablet TAKE 1 TABLET BY MOUTH EVERY EVENING AT BEDTIME 12/07/20 12/17/21  Delrae Rend, MD  Bacillus Coagulans-Inulin (PROBIOTIC) 1-250 BILLION-MG CAPS Take 1 capsule by mouth daily.    [provider]  Continuous Blood Gluc Receiver (DEXCOM G6 RECEIVER) DEVI USE AS DIRECTED 01/06/21 01/06/22  Delrae Rend, MD  cyanocobalamin (,VITAMIN B-12,) 1000 MCG/ML injection Inject 1 ml (1000 mcg) subcutaneously daily for 1 week, then weekly for 1 month, then once every 2 weeks 12/01/21    Plotnikov, Evie Lacks, MD  dapagliflozin propanediol (FARXIGA) 10 MG TABS tablet Take 1 tablet by mouth once a day 07/13/21     escitalopram (LEXAPRO) 20 MG tablet TAKE 2 TABLETS BY MOUTH ONCE A DAY. 11/25/21     hyoscyamine (LEVBID) 0.375 MG 12 hr tablet Take 1 tablet  by mouth as needed as directed (30 days). 11/25/21   Ronnette Juniper, MD  inFLIXimab (REMICADE) 100 MG injection every 8 (eight) weeks.    [provider]  Insulin Pen Needle 32G X 4 MM MISC by Does not apply route. BD U/F Pen Needles Nano 4 mm x 32 G; use with insulin    [provider]  insulin regular human CONCENTRATED (HUMULIN R U-500 KWIKPEN) 500 UNIT/ML kwikpen Inject 90 units under the skin at lunch and 40 units at evening meal 05/26/21     LORazepam (ATIVAN) 1 MG tablet Take 1/2 tablet by mouth once daily as needed 11/25/21     losartan (COZAAR) 50 MG tablet Take 50 mg by mouth daily. 10/18/19   [provider]  medroxyPROGESTERone (DEPO-PROVERA) 150 MG/ML injection Inject 150 mg into the muscle every 3 (three) months.    [provider]  MELATONIN PO Take 20 capsules by mouth daily.    [provider]  nystatin-triamcinolone (MYCOLOG II) cream APPLY TO THE AFFECTED AREA(S) TWO TIMES DAILY AS DIRECTED 09/25/21     promethazine (PHENERGAN) 25 MG tablet TAKE 1 TABLET BY MOUTH EVERY 8 HOURS 11/25/21     repaglinide (PRANDIN) 1 MG tablet Take 1 tablet (1  mg total) by mouth 3 (three) times daily before meals. 12/19/21   Plotnikov, Evie Lacks, MD  Semaglutide,0.25 or 0.5MG /DOS, (OZEMPIC, 0.25 OR 0.5 MG/DOSE,) 2 MG/1.5ML SOPN Inject 0.25 mg once a week for 4 weeks then increase to 0.5 mg once a week subcutaneously 05/25/21     spironolactone (ALDACTONE) 50 MG tablet Take 1 tablet by mouth every day. 11/30/21     SYRINGE-NEEDLE, DISP, 3 ML 25G X 5/8" 3 ML MISC Use to inject B12 under the skin 12/01/21   Plotnikov, Evie Lacks, MD  Vitamin D, Ergocalciferol, (DRISDOL) 1.25 MG (50000 UNIT) CAPS capsule Take  1 capsule (50,000 Units total) by mouth every 7 (seven) days. 12/01/21   Plotnikov, Evie Lacks, MD      Allergies    Metronidazole and Shellfish allergy    Review of Systems   Review of Systems  Constitutional:  Negative for appetite change and fatigue.  HENT:  Negative for congestion, ear discharge and sinus pressure.   Eyes:  Negative for discharge.  Respiratory:  Negative for cough.   Cardiovascular:  Negative for chest pain.  Gastrointestinal:  Positive for diarrhea and vomiting. Negative for abdominal pain.  Genitourinary:  Negative for frequency and hematuria.  Musculoskeletal:  Negative for back pain.  Skin:  Negative for rash.  Neurological:  Negative for seizures and headaches.  Psychiatric/Behavioral:  Negative for hallucinations.    Physical Exam Updated Vital Signs BP 125/85    Pulse 91    Temp 98 F (36.7 C) (Oral)    Resp 18    SpO2 100%  Physical Exam Vitals and nursing note reviewed.  Constitutional:      Appearance: She is well-developed.  HENT:     Head: Normocephalic.     Nose: Nose normal.  Eyes:     General: No scleral icterus.    Conjunctiva/sclera: Conjunctivae normal.  Neck:     Thyroid: No thyromegaly.  Cardiovascular:     Rate and Rhythm: Normal rate and regular rhythm.     Heart sounds: No murmur heard.   No friction rub. No gallop.  Pulmonary:     Breath sounds: No stridor. No wheezing or rales.  Chest:     Chest wall: No tenderness.  Abdominal:     General: There is no distension.     Tenderness: There is abdominal tenderness. There is no rebound.  Musculoskeletal:        General: Normal range of motion.     Cervical back: Neck supple.  Lymphadenopathy:     Cervical: No cervical adenopathy.  Skin:    Findings: No erythema or rash.  Neurological:     Mental Status: She is alert and oriented to person, place, and time.     Motor: No abnormal muscle tone.     Coordination: Coordination normal.  Psychiatric:        Behavior: Behavior  normal.    ED Results / Procedures / Treatments   Labs (all labs ordered are listed, but only abnormal results are displayed) Labs Reviewed  CBC WITH DIFFERENTIAL/PLATELET - Abnormal; Notable for the following components:      Result Value   WBC 13.3 (*)    RBC 6.02 (*)    Hemoglobin 16.1 (*)    HCT 49.6 (*)    Neutro Abs 10.7 (*)    All other components within normal limits  COMPREHENSIVE METABOLIC PANEL - Abnormal; Notable for the following components:   CO2 21 (*)    Glucose, Bld 340 (*)  AST 14 (*)    Total Bilirubin 1.6 (*)    All other components within normal limits  HCG, QUANTITATIVE, PREGNANCY    EKG None  Radiology CT ABDOMEN PELVIS W CONTRAST  Result Date: 12/20/2021 CLINICAL DATA:  Abdominal pain, acute, nonlocalized. States she ate a catered meal at lunch to and about and hour after she started vomiting and also has diarrhea,also c/o abdominal pain EXAM: CT ABDOMEN AND PELVIS WITH CONTRAST TECHNIQUE: Multidetector CT imaging of the abdomen and pelvis was performed using the standard protocol following bolus administration of intravenous contrast. RADIATION DOSE REDUCTION: This exam was performed according to the departmental dose-optimization program which includes automated exposure control, adjustment of the mA and/or kV according to patient size and/or use of iterative reconstruction technique. CONTRAST:  175mL OMNIPAQUE IOHEXOL 300 MG/ML  SOLN COMPARISON:  CT abdomen pelvis 11/12/2019 FINDINGS: Lower chest: No acute abnormality. Hepatobiliary: The liver is enlarged measuring up to 19.5 cm. Likely focal fatty infiltration along the falciform ligament (2:23). Otherwise no focal liver abnormality. Status post cholecystectomy. No biliary dilatation. Pancreas: No focal lesion. Normal pancreatic contour. No surrounding inflammatory changes. No main pancreatic ductal dilatation. Spleen: Normal in size without focal abnormality. Adrenals/Urinary Tract: No adrenal nodule  bilaterally. Bilateral kidneys enhance symmetrically. No hydronephrosis. No hydroureter. Circumferential urinary bladder wall thickening likely due to under distension. On delayed imaging, there is no urothelial wall thickening and there are no filling defects in the opacified portions of the bilateral collecting systems or ureters. Stomach/Bowel: Stomach is within normal limits. No evidence of bowel wall thickening or dilatation. Diffuse colonic diverticulosis. Appendix appears normal. Vascular/Lymphatic: No abdominal aorta or iliac aneurysm. Mild atherosclerotic plaque of the aorta and its branches. No abdominal, pelvic, or inguinal lymphadenopathy. Reproductive: Uterus and bilateral adnexa are unremarkable. Other: No intraperitoneal free fluid. No intraperitoneal free gas. No organized fluid collection. Musculoskeletal: No abdominal wall hernia or abnormality. No suspicious lytic or blastic osseous lesions. No acute displaced fracture. IMPRESSION: 1. Colonic diverticulosis with no acute diverticulitis. 2. Mild hepatomegaly. 3. Status post cholecystectomy. 4.  Mild aortic Atherosclerosis (ICD10-I70.0). Electronically Signed   By: Iven Finn M.D.   On: 12/20/2021 22:44    Procedures Procedures    Medications Ordered in ED Medications  sodium chloride 0.9 % bolus 2,000 mL (0 mLs Intravenous Stopped 12/20/21 2231)  ondansetron (ZOFRAN) injection 4 mg (4 mg Intravenous Given 12/20/21 2023)  ketorolac (TORADOL) 30 MG/ML injection 30 mg (30 mg Intravenous Given 12/20/21 2022)  iohexol (OMNIPAQUE) 300 MG/ML solution 100 mL (100 mLs Intravenous Contrast Given 12/20/21 2232)    ED Course/ Medical Decision Making/ A&P                           Medical Decision Making Amount and/or Complexity of Data Reviewed Labs: ordered. Radiology: ordered.  Risk Prescription drug management.   Patient with vomiting and diarrhea consistent with gastroenteritis.  She is sent home with some Zofran and some pain  medicine.  Patient does have a history of Crohn's but the CT scan is unremarkable.  She will follow-up with her PCP    This patient presents to the ED for concern of vomiting and diarrhea, this involves an extensive number of treatment options, and is a complaint that carries with it a high risk of complications and morbidity.  The differential diagnosis includes gastroenteritis, worsening Crohn's   Co morbidities that complicate the patient evaluation  Crohn's disease   Additional history obtained:  Additional history obtained from patient External records from outside source obtained and reviewed including hospital record   Lab Tests:  I Ordered, and personally interpreted labs.  The pertinent results include: CBC and chemistries.  Patient has mild elevation of white count 13.7   Imaging Studies ordered:  I ordered imaging studies including CT scan of the abdomen I independently visualized and interpreted imaging which showed unremarkable I agree with the radiologist interpretation   Cardiac Monitoring:  The patient was maintained on a cardiac monitor.  I personally viewed and interpreted the cardiac monitored which showed an underlying rhythm of: Normal sinus rhythm   Medicines ordered and prescription drug management:  I ordered medication including Zofran for nausea Toradol for pain Reevaluation of the patient after these medicines showed that the patient improved I have reviewed the patients home medicines and have made adjustments as needed   Test Considered:  None   Critical Interventions:  IV fluids and pain medicine   Consultations Obtained:  No consult  Problem List / ED Course:  Crohn's disease and vomiting and diarrhea   Reevaluation:  After the interventions noted above, I reevaluated the patient and found that they have :improved   Social Determinants of Health:  None   Dispostion:  After consideration of the diagnostic results  and the patients response to treatment, I feel that the patent would benefit from discharge home with pain medicine and nausea medicine and close follow-up with her doctors.         Final Clinical Impression(s) / ED Diagnoses Final diagnoses:  Gastroenteritis    Rx / DC Orders ED Discharge Orders          Ordered    ondansetron (ZOFRAN-ODT) 4 MG disintegrating tablet        12/20/21 2318    HYDROcodone-acetaminophen (NORCO/VICODIN) 5-325 MG tablet  Every 6 hours PRN        12/20/21 2318              Milton Ferguson, MD 12/24/21 1007

## 2021-12-21 ENCOUNTER — Other Ambulatory Visit (HOSPITAL_COMMUNITY): Payer: Self-pay

## 2021-12-21 NOTE — Progress Notes (Signed)
Cindy Long [address] [place] Phone: [phone] Subjective:   [name], am serving as a scribe for Dr. Hulan Saas. This visit occurred during the SARS-CoV-2 public health emergency.  Safety protocols were in place, including screening questions prior to the visit, additional usage of staff PPE, and extensive cleaning of exam room while observing appropriate contact time as indicated for disinfecting solutions.   I'm seeing this patient by the request  of:  Plotnikov, Evie Lacks, MD  CC: Knee pain follow-up  [id number]  10/21/2021 Viscosupplementation given today.  Tolerated procedure well.  Patient knows the postinjection instructions and what to watch out for and when to seek medical attention.  I am hopeful that this will do fairly good in this individual.  Once again secondary to patient's diabetes relative contraindication would be steroid injections but if necessary we can always try this as well.  Follow-up with me again 4 to 8 weeks.  Secondary to patient's likely diabetes.  Patient does need to monitor for any type of peripheral neuropathy.  Update 12/22/2021 Cindy Long is a 46 y.o. female coming in with complaint of B knee pain. Durolane given last visit. Patient states worked well and now its starting to feel pain again. No steroid injection. Right ankle pain started Saturday on lateral side. Slightly rolled, but caught herself. Starting prednisone today. No other complaints.  Recently seen in the emergency department for gastroenterology 2 days ago.     Past Medical History:  Diagnosis Date   Chest pressure 11/18/2021   Crohn disease ([place])    Diabetes mellitus    IDDM   Essential hypertension 11/18/2021   GERD (gastroesophageal reflux disease)    no meds currently   High cholesterol    Hypertension    Mastitis    right breast   Postpartum care following cesarean delivery (2/10) 12/17/2013   Pure  hypercholesterolemia 11/18/2021   Past Surgical History:  Procedure Laterality Date   CERVICAL CERCLAGE     CERVICAL CERCLAGE N/A 06/21/2013   Procedure: [place] CERCLAGE CERVICAL;  Surgeon: Marvene Staff, MD;  Location: [facility];  Service: Gynecology;  Laterality: N/A;   CESAREAN SECTION     CESAREAN SECTION N/A 12/17/2013   Procedure: Repeat CESAREAN SECTION with Cerclage Removal;  Surgeon: Marvene Staff, MD;  Location: [facility];  Service: Obstetrics;  Laterality: N/A;  EDD: 12/22/13   CHOLECYSTECTOMY     CHOLECYSTECTOMY OPEN  08/2011   LAPAROSCOPIC ENDOMETRIOSIS FULGURATION  01/2011   Social History   Socioeconomic History   Marital status: Married    Spouse name: Not on file   Number of children: Not on file   Years of education: Not on file   Highest education level: Not on file  Occupational History   Not on file  Tobacco Use   Smoking status: Never   Smokeless tobacco: Never  Substance and Sexual Activity   Alcohol use: Yes    Comment: occasionally   Drug use: No   Sexual activity: Not on file  Other Topics Concern   Not on file  Social History Narrative   Not on file   Social Determinants of Health   Financial Resource Strain: Low Risk    Difficulty of Paying Living Expenses: Not hard at all  Food Insecurity: No Food Insecurity   Worried About [place] in the Last Year: Never true   [place] in the Last Year:  Never true  Transportation Needs: No Transportation Needs   Lack of Transportation (Medical): No   Lack of Transportation (Non-Medical): No  Physical Activity: Inactive   Days of Exercise per Week: 0 days   Minutes of Exercise per Session: 0 min  Stress: Not on file  Social Connections: Not on file   Allergies  Allergen Reactions   Metronidazole Swelling    Throat swelling   Shellfish Allergy Swelling    Swelling of the throat   Family History  Problem Relation Age of Onset   Hypertension Mother    Diabetes Maternal  Aunt    Hypertension Maternal Grandmother    Cancer Maternal Grandmother        breast, colon   Diabetes Maternal Grandmother    Hypertension Maternal Grandfather    Heart attack Cousin 23    Current Outpatient Medications (Endocrine & Metabolic):    dapagliflozin propanediol (FARXIGA) 10 MG TABS tablet, Take 1 tablet by mouth once a day   insulin regular human CONCENTRATED (HUMULIN R U-500 KWIKPEN) 500 UNIT/ML kwikpen, Inject 90 units under the skin at lunch and 40 units at evening meal   medroxyPROGESTERone (DEPO-PROVERA) 150 MG/ML injection, Inject 150 mg into the muscle every 3 (three) months.   predniSONE (DELTASONE) 5 MG tablet, Take 3 tablets by mouth today, 2 tablets tomorrow, then 1 tablet daily for the following 2 days.   repaglinide (PRANDIN) 1 MG tablet, Take 1 tablet (1 mg total) by mouth 3 (three) times daily before meals.   Semaglutide,0.25 or 0.5MG /DOS, (OZEMPIC, 0.25 OR 0.5 MG/DOSE,) 2 MG/1.5ML SOPN, Inject 0.25 mg once a week for 4 weeks then increase to 0.5 mg once a week subcutaneously  Current Outpatient Medications (Cardiovascular):    atorvastatin (LIPITOR) 10 MG tablet, TAKE 1 TABLET BY MOUTH EVERY EVENING AT BEDTIME   losartan (COZAAR) 50 MG tablet, Take 50 mg by mouth daily.   spironolactone (ALDACTONE) 50 MG tablet, Take 1 tablet by mouth every day.  Current Outpatient Medications (Respiratory):    albuterol (PROVENTIL HFA;VENTOLIN HFA) 108 (90 Base) MCG/ACT inhaler, Inhale 1-2 puffs into the lungs every 6 (six) hours as needed for wheezing or shortness of breath.   promethazine (PHENERGAN) 25 MG tablet, TAKE 1 TABLET BY MOUTH EVERY 8 HOURS  Current Outpatient Medications (Analgesics):    HYDROcodone-acetaminophen (NORCO/VICODIN) 5-325 MG tablet, Take 1 tablet by mouth every 6 hours as needed.  Current Outpatient Medications (Hematological):    cyanocobalamin (,VITAMIN B-12,) 1000 MCG/ML injection, Inject 1 ml (1000 mcg) subcutaneously daily for 1 week, then  weekly for 1 month, then once every 2 weeks  Current Outpatient Medications (Other):    Bacillus Coagulans-Inulin (PROBIOTIC) 1-250 BILLION-MG CAPS, Take 1 capsule by mouth daily.   Continuous Blood Gluc Receiver (DEXCOM G6 RECEIVER) DEVI, USE AS DIRECTED   escitalopram (LEXAPRO) 20 MG tablet, TAKE 2 TABLETS BY MOUTH ONCE A DAY.   hyoscyamine (LEVBID) 0.375 MG 12 hr tablet, Take 1 tablet  by mouth as needed as directed (30 days).   inFLIXimab (REMICADE) 100 MG injection, every 8 (eight) weeks.   Insulin Pen Needle 32G X 4 MM MISC, by Does not apply route. BD U/F Pen Needles Nano 4 mm x 32 G; use with insulin   LORazepam (ATIVAN) 1 MG tablet, Take 1/2 tablet by mouth once daily as needed   MELATONIN PO, Take 20 capsules by mouth daily.   nystatin-triamcinolone (MYCOLOG II) cream, APPLY TO THE AFFECTED AREA(S) TWO TIMES DAILY AS DIRECTED   ondansetron (  ZOFRAN-ODT) 4 MG disintegrating tablet, Dissolve 1 tablet in mouth every 6 hours as needed for nausea/vomit   pantoprazole (PROTONIX) 40 MG tablet, Take 1 tablet (40 mg total) by mouth 2 (two) times daily.   SYRINGE-NEEDLE, DISP, 3 ML 25G X 5/8" 3 ML MISC, Use to inject B12 under the skin   Vitamin D, Ergocalciferol, (DRISDOL) 1.25 MG (50000 UNIT) CAPS capsule, Take 1 capsule (50,000 Units total) by mouth every 7 (seven) days.   Reviewed prior external information including notes and imaging from  primary care provider As well as notes that were available from care everywhere and other healthcare systems.  As we stated above reviewed patient's most recent emergency room visit  Past medical history, social, surgical and family history all reviewed in electronic medical record.  No pertanent information unless stated regarding to the chief complaint.   Review of Systems:  No headache, visual changes, , vomiting, diarrhea, constipation, dizziness, abdominal pain, skin rash, fevers, chills, night sweats,  swollen lymph nodes, body aches, joint  swelling, chest pain, shortness of breath, mood changes. POSITIVE muscle aches, body aches and weight loss as well as nausea  Objective  Blood pressure 116/78, pulse 98, height 5\' 6"  (1.676 m), weight 152 lb (68.9 kg), SpO2 99 %, unknown if currently breastfeeding.   General: No apparent distress alert and oriented x3 mood and affect normal, dressed appropriately.  HEENT: Pupils equal, extraocular movements intact  Respiratory: Patient's speak in full sentences and does not appear short of breath  Cardiovascular: No lower extremity edema, non tender, no erythema  Gait antalgic  Patient does have significant atrophy of the lower extremities bilaterally. Mild what appears to be neuropathy noted as well.  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  Instability noted with valgus and varus force.  Right ankle exam does have some mild tenderness over the lateral ankle joint mostly over the ATFL.  No instability, negative anterior drawer minimal swelling  [id number]; 15 additional minutes spent for Therapeutic exercises as stated in above notes.  This included exercises focusing on stretching, strengthening, with significant focus on eccentric aspects.   Long term goals include an improvement in range of motion, strength, endurance as well as avoiding reinjury. Patient's frequency would include in 1-2 times a day, 3-5 times a week for a duration of 6-12 weeks. Ankle strengthening that included:  Basic range of motion exercises to allow proper full motion at ankle Stretching of the lower leg and hamstrings  Theraband exercises for the lower leg - inversion, eversion, dorsiflexion and plantarflexion each to be completed with a theraband Balance exercises to increase proprioception Weight bearing exercises to increase strength and balance  Proper technique shown and discussed handout in great detail with ATC.  All questions were discussed and answered.     Impression and Recommendations:      The above documentation has been reviewed and is accurate and complete Lyndal Pulley, DO

## 2021-12-22 ENCOUNTER — Ambulatory Visit: Payer: 59 | Admitting: Internal Medicine

## 2021-12-22 ENCOUNTER — Other Ambulatory Visit: Payer: Self-pay

## 2021-12-22 ENCOUNTER — Other Ambulatory Visit (HOSPITAL_COMMUNITY): Payer: Self-pay

## 2021-12-22 ENCOUNTER — Encounter: Payer: Self-pay | Admitting: Internal Medicine

## 2021-12-22 ENCOUNTER — Ambulatory Visit: Payer: 59 | Admitting: Family Medicine

## 2021-12-22 DIAGNOSIS — R1900 Intra-abdominal and pelvic swelling, mass and lump, unspecified site: Secondary | ICD-10-CM | POA: Insufficient documentation

## 2021-12-22 DIAGNOSIS — J4521 Mild intermittent asthma with (acute) exacerbation: Secondary | ICD-10-CM | POA: Insufficient documentation

## 2021-12-22 DIAGNOSIS — K50819 Crohn's disease of both small and large intestine with unspecified complications: Secondary | ICD-10-CM | POA: Diagnosis not present

## 2021-12-22 DIAGNOSIS — Z794 Long term (current) use of insulin: Secondary | ICD-10-CM | POA: Diagnosis not present

## 2021-12-22 DIAGNOSIS — M546 Pain in thoracic spine: Secondary | ICD-10-CM | POA: Insufficient documentation

## 2021-12-22 DIAGNOSIS — S93491A Sprain of other ligament of right ankle, initial encounter: Secondary | ICD-10-CM | POA: Diagnosis not present

## 2021-12-22 DIAGNOSIS — K219 Gastro-esophageal reflux disease without esophagitis: Secondary | ICD-10-CM

## 2021-12-22 DIAGNOSIS — Z803 Family history of malignant neoplasm of breast: Secondary | ICD-10-CM | POA: Insufficient documentation

## 2021-12-22 DIAGNOSIS — E11618 Type 2 diabetes mellitus with other diabetic arthropathy: Secondary | ICD-10-CM | POA: Diagnosis not present

## 2021-12-22 DIAGNOSIS — F5102 Adjustment insomnia: Secondary | ICD-10-CM | POA: Insufficient documentation

## 2021-12-22 DIAGNOSIS — E538 Deficiency of other specified B group vitamins: Secondary | ICD-10-CM | POA: Diagnosis not present

## 2021-12-22 DIAGNOSIS — E1165 Type 2 diabetes mellitus with hyperglycemia: Secondary | ICD-10-CM | POA: Insufficient documentation

## 2021-12-22 DIAGNOSIS — K5730 Diverticulosis of large intestine without perforation or abscess without bleeding: Secondary | ICD-10-CM | POA: Insufficient documentation

## 2021-12-22 DIAGNOSIS — K625 Hemorrhage of anus and rectum: Secondary | ICD-10-CM | POA: Insufficient documentation

## 2021-12-22 DIAGNOSIS — F418 Other specified anxiety disorders: Secondary | ICD-10-CM | POA: Insufficient documentation

## 2021-12-22 DIAGNOSIS — F40243 Fear of flying: Secondary | ICD-10-CM | POA: Insufficient documentation

## 2021-12-22 DIAGNOSIS — S93401A Sprain of unspecified ligament of right ankle, initial encounter: Secondary | ICD-10-CM | POA: Insufficient documentation

## 2021-12-22 DIAGNOSIS — M62569 Muscle wasting and atrophy, not elsewhere classified, unspecified lower leg: Secondary | ICD-10-CM | POA: Diagnosis not present

## 2021-12-22 DIAGNOSIS — K5080 Crohn's disease of both small and large intestine without complications: Secondary | ICD-10-CM | POA: Insufficient documentation

## 2021-12-22 MED ORDER — PANTOPRAZOLE SODIUM 40 MG PO TBEC
40.0000 mg | DELAYED_RELEASE_TABLET | Freq: Two times a day (BID) | ORAL | 1 refills | Status: DC
  Filled 2021-12-22: qty 60, 30d supply, fill #0

## 2021-12-22 MED ORDER — PREDNISONE 5 MG PO TABS
ORAL_TABLET | ORAL | 0 refills | Status: DC
  Filled 2021-12-22: qty 7, 4d supply, fill #0

## 2021-12-22 NOTE — Patient Instructions (Signed)
Do prescribed exercises at least 3x a week Wearing brace when doing lots of activity for next 2 weeks Ice 20 minutes 2 times daily. Usually after activity and before bed. See you again in 6-8 weeks

## 2021-12-22 NOTE — Assessment & Plan Note (Signed)
Patient does have some right ankle sprain noted.  Discussed with patient icing regimen and home exercises, discussed with patient..  Discussed icing regimen home exercises increase activity slowly.  I do believe patient will heal well

## 2021-12-22 NOTE — Assessment & Plan Note (Signed)
Seems to be secondary to peripheral neuropathy likely secondary to patient's hyperglycemia. Discussed with patient the importance of keeping her blood sugar under control.  Discussed the B12 injections as well as the B6 to help with absorption.

## 2021-12-22 NOTE — Patient Instructions (Signed)
  Gluten free trial for 4-6 weeks. OK to use gluten-free bread and gluten-free pasta.    Gluten-Free Diet for Celiac Disease, Adult The gluten-free diet includes all foods that do not contain gluten. Gluten is a protein that is found in wheat, rye, barley, and some other grains. Following the gluten-free diet is the only treatment for people with celiac disease. It helps to prevent damage to the intestines and improves or eliminates the symptoms of celiac disease. Following the gluten-free diet requires some planning. It can be challenging at first, but it gets easier with time and practice. There are more gluten-free options available today than ever before. If you need help finding gluten-free foods or if you have questions, talk with your diet and nutrition specialist (registered dietitian) or your health care provider. What do I need to know about a gluten-free diet?  All fruits, vegetables, and meats are safe to eat and do not contain gluten.  When grocery shopping, start by shopping in the produce, meat, and dairy sections. These sections are more likely to contain gluten-free foods. Then move to the aisles that contain packaged foods if you need to.  Read all food labels. Gluten is often added to foods. Always check the ingredient list and look for warnings, such as "may contain gluten."  Talk with your dietitian or health care provider before taking a gluten-free multivitamin or mineral supplement.  Be aware of gluten-free foods having contact with foods that contain gluten (cross-contamination). This can happen at home and with any processed foods. ? Talk with your health care provider or dietitian about how to reduce the risk of cross-contamination in your home. ? If you have questions about how a food is processed, ask the manufacturer. What key words help to identify gluten? Foods that list any of these key words on the label usually contain gluten:  Wheat, flour, enriched  flour, bromated flour, white flour, durum flour, graham flour, phosphated flour, self-rising flour, semolina, farina, barley (malt), rye, and oats.  Starch, dextrin, modified food starch, or cereal.  Thickening, fillers, or emulsifiers.  Malt flavoring, malt extract, or malt syrup.  Hydrolyzed vegetable protein.  In the [place] packaged foods that are gluten-free are required to be labeled "GF." These foods should be easy to identify and are safe to eat. In the [place] food companies are also required to list common food allergens, including wheat, on their labels. Recommended foods Grains  Amaranth, bean flours, 100% buckwheat flour, corn, millet, nut flours or nut meals, GF oats, quinoa, rice, sorghum, teff, rice wafers, pure cornmeal tortillas, popcorn, and hot cereals made from cornmeal. Hominy, rice, wild rice. Some Asian rice noodles or bean noodles. Arrowroot starch, corn bran, corn flour, corn germ, cornmeal, corn starch, potato flour, potato starch flour, and rice bran. Plain, brown, and sweet rice flours. Rice polish, soy flour, and tapioca starch. Vegetables  All plain fresh, frozen, and canned vegetables. Fruits  All plain fresh, frozen, canned, and dried fruits, and 100% fruit juices. Meats and other protein foods  All fresh beef, pork, poultry, fish, seafood, and eggs. Fish canned in water, oil, brine, or vegetable broth. Plain nuts and seeds, peanut butter. Some lunch meat and some frankfurters. Dried beans, dried peas, and lentils. Dairy  Fresh plain, dry, evaporated, or condensed milk. Cream, butter, sour cream, whipping cream, and most yogurts. Unprocessed cheese, most processed cheeses, some cottage cheese, some cream cheeses. Beverages  Coffee, tea, most herbal teas. Carbonated beverages and some root beers.   Wine, sake, and distilled spirits, such as gin, vodka, and whiskey. Most hard ciders. Fats and oils  Butter, margarine, vegetable oil, hydrogenated butter, olive  oil, shortening, lard, cream, and some mayonnaise. Some commercial salad dressings. Olives. Sweets and desserts  Sugar, honey, some syrups, molasses, jelly, and jam. Plain hard candy, marshmallows, and gumdrops. Pure cocoa powder. Plain chocolate. Custard and some pudding mixes. Gelatin desserts, sorbets, frozen ice pops, and sherbet. Cake, cookies, and other desserts prepared with allowed flours. Some commercial ice creams. Cornstarch, tapioca, and rice puddings. Seasoning and other foods  Some canned or frozen soups. Monosodium glutamate (MSG). Cider, rice, and wine vinegar. Baking soda and baking powder. Cream of tartar. Baking and nutritional yeast. Certain soy sauces made without wheat (ask your dietitian about specific brands that are allowed). Nuts, coconut, and chocolate. Salt, pepper, herbs, spices, flavoring extracts, imitation or artificial flavorings, natural flavorings, and food colorings. Some medicines and supplements. Some lip glosses and other cosmetics. Rice syrups. The items listed may not be a complete list. Talk with your dietitian about what dietary choices are best for you. Foods to avoid Grains  Barley, bran, bulgur, couscous, cracked wheat, [place], farro, graham, malt, matzo, semolina, wheat germ, and all wheat and rye cereals including spelt and kamut. Cereals containing malt as a flavoring, such as rice cereal. Noodles, spaghetti, macaroni, most packaged rice mixes, and all mixes containing wheat, rye, barley, or triticale. Vegetables  Most creamed vegetables and most vegetables canned in sauces. Some commercially prepared vegetables and salads. Fruits  Thickened or prepared fruits and some pie fillings. Some fruit snacks and fruit roll-ups. Meats and other protein foods  Any meat or meat alternative containing wheat, rye, barley, or gluten stabilizers. These are often marinated or packaged meats and lunch meats. Bread-containing products, such as Swiss steak,  croquettes, meatballs, and meatloaf. Most tuna canned in vegetable broth and turkey with hydrolyzed vegetable protein (HVP) injected as part of the basting. Seitan. Imitation fish. Eggs in sauces made from ingredients to avoid. Dairy  Commercial chocolate milk drinks and malted milk. Some non-dairy creamers. Any cheese product containing ingredients to avoid. Beverages  Certain cereal beverages. Beer, ale, malted milk, and some root beers. Some hard ciders. Some instant flavored coffees. Some herbal teas made with barley or with barley malt added. Fats and oils  Some commercial salad dressings. Sour cream containing modified food starch. Sweets and desserts  Some toffees. Chocolate-coated nuts (may be rolled in wheat flour) and some commercial candies and candy bars. Most cakes, cookies, donuts, pastries, and other baked goods. Some commercial ice cream. Ice cream cones. Commercially prepared mixes for cakes, cookies, and other desserts. Bread pudding and other puddings thickened with flour. Products containing brown rice syrup made with barley malt enzyme. Desserts and sweets made with malt flavoring. Seasoning and other foods  Some curry powders, some dry seasoning mixes, some gravy extracts, some meat sauces, some ketchups, some prepared mustards, and horseradish. Certain soy sauces. Malt vinegar. Bouillon and bouillon cubes that contain HVP. Some chip dips, and some chewing gum. Yeast extract. Brewer's yeast. Caramel color. Some medicines and supplements. Some lip glosses and other cosmetics. The items listed may not be a complete list. Talk with your dietitian about what dietary choices are best for you. Summary  Gluten is a protein that is found in wheat, rye, barley, and some other grains. The gluten-free diet includes all foods that do not contain gluten.  If you need help finding gluten-free foods or if   you have questions, talk with your diet and nutrition specialist (registered  dietitian) or your health care provider.  Read all food labels. Gluten is often added to foods. Always check the ingredient list and look for warnings, such as "may contain gluten." This information is not intended to replace advice given to you by your health care provider. Make sure you discuss any questions you have with your health care provider. Document Released: 10/24/2005 Document Revised: 08/08/2016 Document Reviewed: 08/08/2016 Elsevier Interactive Patient Education  2018 [employer]   

## 2021-12-22 NOTE — Progress Notes (Signed)
Subjective:  Patient ID: Cindy Long, female    DOB: [birth year]  Age: 46 y.o. MRN: [id number]  CC: No chief complaint on file.   HPI [employer] presents for abd pain, diarrhea Pt had a food poisoning on Mon - after lunch at work  Outpatient Medications Prior to Visit  Medication Sig Dispense Refill   albuterol (PROVENTIL HFA;VENTOLIN HFA) 108 (90 Base) MCG/ACT inhaler Inhale 1-2 puffs into the lungs every 6 (six) hours as needed for wheezing or shortness of breath.     Bacillus Coagulans-Inulin (PROBIOTIC) 1-250 BILLION-MG CAPS Take 1 capsule by mouth daily.     Continuous Blood Gluc Receiver (DEXCOM G6 RECEIVER) DEVI USE AS DIRECTED 1 each 0   cyanocobalamin (,VITAMIN B-12,) 1000 MCG/ML injection Inject 1 ml (1000 mcg) subcutaneously daily for 1 week, then weekly for 1 month, then once every 2 weeks 10 mL 6   dapagliflozin propanediol (FARXIGA) 10 MG TABS tablet Take 1 tablet by mouth once a day 30 tablet 5   escitalopram (LEXAPRO) 20 MG tablet TAKE 2 TABLETS BY MOUTH ONCE A DAY. 180 tablet 3   HYDROcodone-acetaminophen (NORCO/VICODIN) 5-325 MG tablet Take 1 tablet by mouth every 6 hours as needed. 20 tablet 0   hyoscyamine (LEVBID) 0.375 MG 12 hr tablet Take 1 tablet  by mouth as needed as directed (30 days). 30 tablet 1   inFLIXimab (REMICADE) 100 MG injection every 8 (eight) weeks.     Insulin Pen Needle 32G X 4 MM MISC by Does not apply route. BD U/F Pen Needles Nano 4 mm x 32 G; use with insulin     insulin regular human CONCENTRATED (HUMULIN R U-500 KWIKPEN) 500 UNIT/ML kwikpen Inject 90 units under the skin at lunch and 40 units at evening meal 24 mL 3   LORazepam (ATIVAN) 1 MG tablet Take 1/2 tablet by mouth once daily as needed 20 tablet 1   losartan (COZAAR) 50 MG tablet Take 50 mg by mouth daily.     medroxyPROGESTERone (DEPO-PROVERA) 150 MG/ML injection Inject 150 mg into the muscle every 3 (three) months.     MELATONIN PO Take 20 capsules by mouth daily.      nystatin-triamcinolone (MYCOLOG II) cream APPLY TO THE AFFECTED AREA(S) TWO TIMES DAILY AS DIRECTED 20 g 2   ondansetron (ZOFRAN-ODT) 4 MG disintegrating tablet Dissolve 1 tablet in mouth every 6 hours as needed for nausea/vomit 12 tablet 0   promethazine (PHENERGAN) 25 MG tablet TAKE 1 TABLET BY MOUTH EVERY 8 HOURS 90 tablet 1   repaglinide (PRANDIN) 1 MG tablet Take 1 tablet (1 mg total) by mouth 3 (three) times daily before meals. 270 tablet 1   Semaglutide,0.25 or 0.5MG /DOS, (OZEMPIC, 0.25 OR 0.5 MG/DOSE,) 2 MG/1.5ML SOPN Inject 0.25 mg once a week for 4 weeks then increase to 0.5 mg once a week subcutaneously 1.5 mL 11   spironolactone (ALDACTONE) 50 MG tablet Take 1 tablet by mouth every day. 30 tablet 11   SYRINGE-NEEDLE, DISP, 3 ML 25G X 5/8" 3 ML MISC Use to inject B12 under the skin 50 each 3   Vitamin D, Ergocalciferol, (DRISDOL) 1.25 MG (50000 UNIT) CAPS capsule Take 1 capsule (50,000 Units total) by mouth every 7 (seven) days. 8 capsule 0   atorvastatin (LIPITOR) 10 MG tablet TAKE 1 TABLET BY MOUTH EVERY EVENING AT BEDTIME 90 tablet 3   No facility-administered medications prior to visit.    ROS: Review of Systems  Constitutional:  Positive for fatigue.  Negative for activity change, appetite change, chills and unexpected weight change.  HENT:  Negative for congestion, mouth sores and sinus pressure.   Eyes:  Negative for visual disturbance.  Respiratory:  Negative for cough and chest tightness.   Gastrointestinal:  Positive for diarrhea. Negative for abdominal pain and nausea.  Genitourinary:  Negative for difficulty urinating, frequency and vaginal pain.  Musculoskeletal:  Positive for arthralgias and back pain. Negative for gait problem.  Skin:  Negative for pallor and rash.  Neurological:  Positive for weakness. Negative for dizziness, tremors, numbness and headaches.  Psychiatric/Behavioral:  Negative for confusion and sleep disturbance.    Objective:  BP (!) 150/100 (BP  Location: Left Arm, Patient Position: Sitting, Cuff Size: Large)    Pulse (!) 101    Temp 98.3 F (36.8 C) (Oral)    Ht 5\' 6"  (1.676 m)    Wt 153 lb (69.4 kg)    SpO2 97%    BMI 24.69 kg/m   BP Readings from Last 3 Encounters:  12/31/21 116/87  12/22/21 116/78  12/22/21 (!) 150/100    Wt Readings from Last 3 Encounters:  12/31/21 147 lb 3.2 oz (66.8 kg)  12/22/21 152 lb (68.9 kg)  12/22/21 153 lb (69.4 kg)    Physical Exam Constitutional:      General: She is not in acute distress.    Appearance: She is well-developed. She is not ill-appearing.  HENT:     Head: Normocephalic.     Right Ear: External ear normal.     Left Ear: External ear normal.     Nose: Nose normal.  Eyes:     General:        Right eye: No discharge.        Left eye: No discharge.     Conjunctiva/sclera: Conjunctivae normal.     Pupils: Pupils are equal, round, and reactive to light.  Neck:     Thyroid: No thyromegaly.     Vascular: No JVD.     Trachea: No tracheal deviation.  Cardiovascular:     Rate and Rhythm: Normal rate and regular rhythm.     Heart sounds: Normal heart sounds.  Pulmonary:     Effort: No respiratory distress.     Breath sounds: No stridor. No wheezing.  Abdominal:     General: Bowel sounds are normal. There is no distension.     Palpations: Abdomen is soft. There is no mass.     Tenderness: There is no abdominal tenderness. There is no guarding or rebound.  Musculoskeletal:        General: No tenderness.     Cervical back: Normal range of motion and neck supple. No rigidity.  Lymphadenopathy:     Cervical: No cervical adenopathy.  Skin:    Findings: No erythema or rash.  Neurological:     Cranial Nerves: No cranial nerve deficit.     Motor: No abnormal muscle tone.     Coordination: Coordination normal.     Deep Tendon Reflexes: Reflexes normal.  Psychiatric:        Behavior: Behavior normal.        Thought Content: Thought content normal.        Judgment: Judgment  normal.   Appears tired Sensitive to palpation abdomen Lab Results  Component Value Date   WBC 13.3 (H) 12/20/2021   HGB 16.1 (H) 12/20/2021   HCT 49.6 (H) 12/20/2021   PLT 247 12/20/2021   GLUCOSE 340 (H) 12/20/2021   ALT  21 12/20/2021   AST 14 (L) 12/20/2021   NA 137 12/20/2021   K 4.3 12/20/2021   CL 103 12/20/2021   CREATININE 0.55 12/20/2021   BUN 9 12/20/2021   CO2 21 (L) 12/20/2021   TSH 0.690 11/18/2021   HGBA1C 13.7 (H) 07/16/2021    CT ABDOMEN PELVIS W CONTRAST  Result Date: 12/20/2021 CLINICAL DATA:  Abdominal pain, acute, nonlocalized. States she ate a catered meal at lunch to and about and hour after she started vomiting and also has diarrhea,also c/o abdominal pain EXAM: CT ABDOMEN AND PELVIS WITH CONTRAST TECHNIQUE: Multidetector CT imaging of the abdomen and pelvis was performed using the standard protocol following bolus administration of intravenous contrast. RADIATION DOSE REDUCTION: This exam was performed according to the departmental dose-optimization program which includes automated exposure control, adjustment of the mA and/or kV according to patient size and/or use of iterative reconstruction technique. CONTRAST:  141mL OMNIPAQUE IOHEXOL 300 MG/ML  SOLN COMPARISON:  CT abdomen pelvis 11/12/2019 FINDINGS: Lower chest: No acute abnormality. Hepatobiliary: The liver is enlarged measuring up to 19.5 cm. Likely focal fatty infiltration along the falciform ligament (2:23). Otherwise no focal liver abnormality. Status post cholecystectomy. No biliary dilatation. Pancreas: No focal lesion. Normal pancreatic contour. No surrounding inflammatory changes. No main pancreatic ductal dilatation. Spleen: Normal in size without focal abnormality. Adrenals/Urinary Tract: No adrenal nodule bilaterally. Bilateral kidneys enhance symmetrically. No hydronephrosis. No hydroureter. Circumferential urinary bladder wall thickening likely due to under distension. On delayed imaging, there is  no urothelial wall thickening and there are no filling defects in the opacified portions of the bilateral collecting systems or ureters. Stomach/Bowel: Stomach is within normal limits. No evidence of bowel wall thickening or dilatation. Diffuse colonic diverticulosis. Appendix appears normal. Vascular/Lymphatic: No abdominal aorta or iliac aneurysm. Mild atherosclerotic plaque of the aorta and its branches. No abdominal, pelvic, or inguinal lymphadenopathy. Reproductive: Uterus and bilateral adnexa are unremarkable. Other: No intraperitoneal free fluid. No intraperitoneal free gas. No organized fluid collection. Musculoskeletal: No abdominal wall hernia or abnormality. No suspicious lytic or blastic osseous lesions. No acute displaced fracture. IMPRESSION: 1. Colonic diverticulosis with no acute diverticulitis. 2. Mild hepatomegaly. 3. Status post cholecystectomy. 4.  Mild aortic Atherosclerosis (ICD10-I70.0). Electronically Signed   By: Iven Finn M.D.   On: 12/20/2021 22:44    Assessment & Plan:   Problem List Items Addressed This Visit     B12 deficiency    Continue with vitamin B12.  Gluten-free diet trial      Crohn's disease of small and large intestines (HCC)    Exacerbation due to recent food poisoning.  Steroid taper.  Stayed home for longer      Gastroesophageal reflux disease    Worse.  Continue with pantoprazole      Relevant Medications   pantoprazole (PROTONIX) 40 MG tablet   Type II diabetes mellitus (HCC)    On Farxiga, Ozempic, insulin Hydrate well. Monitor CBGs at home         Meds ordered this encounter  Medications   predniSONE (DELTASONE) 5 MG tablet    Sig: Take 3 tablets by mouth today, 2 tablets tomorrow, then 1 tablet daily for the following 2 days.    Dispense:  7 tablet    Refill:  0    Picking up today   pantoprazole (PROTONIX) 40 MG tablet    Sig: Take 1 tablet (40 mg total) by mouth 2 (two) times daily.    Dispense:  60 tablet  Refill:  1     She will pick up      Follow-up: No follow-ups on file.  Walker Kehr, MD

## 2021-12-27 ENCOUNTER — Other Ambulatory Visit (HOSPITAL_COMMUNITY): Payer: Self-pay

## 2021-12-31 ENCOUNTER — Encounter (HOSPITAL_COMMUNITY)
Admission: RE | Admit: 2021-12-31 | Discharge: 2021-12-31 | Disposition: A | Discharge status: Home / Self Care | Payer: 59 | Source: Ambulatory Visit | Attending: Gastroenterology | Admitting: Gastroenterology

## 2021-12-31 DIAGNOSIS — H16403 Unspecified corneal neovascularization, bilateral: Secondary | ICD-10-CM | POA: Diagnosis not present

## 2021-12-31 DIAGNOSIS — K50818 Crohn's disease of both small and large intestine with other complication: Secondary | ICD-10-CM | POA: Diagnosis not present

## 2021-12-31 DIAGNOSIS — E119 Type 2 diabetes mellitus without complications: Secondary | ICD-10-CM | POA: Diagnosis not present

## 2021-12-31 DIAGNOSIS — H04123 Dry eye syndrome of bilateral lacrimal glands: Secondary | ICD-10-CM | POA: Diagnosis not present

## 2021-12-31 DIAGNOSIS — H10413 Chronic giant papillary conjunctivitis, bilateral: Secondary | ICD-10-CM | POA: Diagnosis not present

## 2021-12-31 MED ORDER — DIPHENHYDRAMINE HCL 25 MG PO CAPS
50.0000 mg | ORAL_CAPSULE | Freq: Once | ORAL | Status: AC
  Administered 2021-12-31: 50 mg via ORAL

## 2021-12-31 MED ORDER — DIPHENHYDRAMINE HCL 25 MG PO CAPS
ORAL_CAPSULE | ORAL | Status: AC
  Filled 2021-12-31: qty 2

## 2021-12-31 MED ORDER — ACETAMINOPHEN 325 MG PO TABS
650.0000 mg | ORAL_TABLET | Freq: Once | ORAL | Status: AC
  Administered 2021-12-31: 650 mg via ORAL

## 2021-12-31 MED ORDER — ACETAMINOPHEN 325 MG PO TABS
ORAL_TABLET | ORAL | Status: AC
  Filled 2021-12-31: qty 2

## 2021-12-31 MED ORDER — SODIUM CHLORIDE 0.9 % IV SOLN
5.0000 mg/kg | Freq: Once | INTRAVENOUS | Status: AC
  Administered 2021-12-31: 300 mg via INTRAVENOUS
  Filled 2021-12-31: qty 30

## 2022-01-04 ENCOUNTER — Other Ambulatory Visit (HOSPITAL_COMMUNITY): Payer: Self-pay

## 2022-01-06 ENCOUNTER — Other Ambulatory Visit (HOSPITAL_COMMUNITY): Payer: Self-pay

## 2022-01-06 DIAGNOSIS — H10413 Chronic giant papillary conjunctivitis, bilateral: Secondary | ICD-10-CM | POA: Diagnosis not present

## 2022-01-07 ENCOUNTER — Other Ambulatory Visit (HOSPITAL_COMMUNITY): Payer: Self-pay

## 2022-01-07 DIAGNOSIS — H5213 Myopia, bilateral: Secondary | ICD-10-CM | POA: Diagnosis not present

## 2022-01-07 LAB — HM DIABETES EYE EXAM

## 2022-01-07 MED ORDER — MEDROXYPROGESTERONE ACETATE 150 MG/ML IM SUSY
PREFILLED_SYRINGE | INTRAMUSCULAR | 4 refills | Status: DC
  Filled 2022-01-07: qty 1, 90d supply, fill #0
  Filled 2022-04-26: qty 1, 90d supply, fill #1

## 2022-01-16 NOTE — Progress Notes (Signed)
Office Visit    Patient Name: Cindy Long Date of Encounter: 01/17/2022  Primary Care Provider:  Cassandria Anger, MD Primary Cardiologist:  Skeet Latch, MD  Chief Complaint    44-monthfollow-up  History of Present Illness    SKACEY DYSERTis a 46y.o. female with PMH of HTN, HLD, DMII, GERD, Crohn's disease.  Previously seen by Dr. GEinar Gipin 2019 for CP.Echo performed 10/19 with LVEF 55%, exercise Myoview 8/19 with no ischemia however BP elevated to 206/72 during stress test.  During the time of the test she was feeling very fatigued and overwhelmed.  She also suffers from Crohn's disease and was noted to be anemic during 11/22.  She established care with Dr. ROval Linseyon 11/18/21. She reported during her visit chest pain and heaviness on the right side with palpitations,not worsened with exertion.  She sometimes hears in the palpitations in her ears.  She also endorses shortness of breath.  Coronary CTA was ordered and 3-day ZIO monitor was placed to rule out ischemia.  Monitor revealed sinus rhythm with no pauses and rare PACs and PVCs.  Coronary CTA showed minimal nonobstructive CAD and calcium score of 1.6.   Since last being seen in our clinic Ms. HArauzreports doing well since last being seen in January.  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 She denies chest pain, palpitations, dyspnea, PND, orthopnea, nausea, vomiting, dizziness, syncope, edema, weight gain, or early satiety.  We discussed at length her cardiac CTA results and also reviewed her ZIO monitor results. She is currently walking daily and has changed her diet gluten-free.  She is down 4 to 5 pounds since last appointment and is very motivated to make some risk modifications and lifestyle changes.   Past Medical History    Past Medical History:  Diagnosis Date   Chest pressure 11/18/2021   Cro[place] disease ([place]    Diabetes mellitus     IDDM   Essential hypertension 11/18/2021   GERD (gastroesophageal reflux disease)    no meds currently   High cholesterol    Hypertension    Mastitis    right breast   Postpartum care following cesarean delivery (2/10) 12/17/2013   Pure hypercholesterolemia 11/18/2021   Past Surgical History:  Procedure Laterality Date   CERVICAL CERCLAGE     CERVICAL CERCLAGE N/A 06/21/2013   Procedure: McDonald CERCLAGE CERVICAL;  Surgeon: SMarvene Staff MD;  [facility];  Service: Gynecology;  Laterality: N/A;   CESAREAN SECTION     CESAREAN SECTION N/A 12/17/2013   Procedure: Repeat CESAREAN SECTION with Cerclage Removal;  Surgeon: SMarvene Staff MD;  [facility];  Service: Obstetrics;  Laterality: N/A;  EDD: 12/22/13   CHOLECYSTECTOMY     CHOLECYSTECTOMY OPEN  08/2011   LAPAROSCOPIC ENDOMETRIOSIS FULGURATION  01/2011    Allergies  Allergies  Allergen Reactions   Metronidazole Swelling    Throat swelling   Shellfish Allergy Swelling    Swelling of the throat    Home Medications    Current Outpatient Medications  Medication Sig Dispense Refill   albuterol (PROVENTIL HFA;VENTOLIN HFA) 108 (90 Base) MCG/ACT inhaler Inhale 1-2 puffs into the lungs every 6 (six) hours as needed for wheezing or shortness of breath.     aspirin EC 81 MG tablet Take 1 tablet (81 mg total) by mouth daily. Swallow whole. 90 tablet 3   atorvastatin (LIPITOR)  20 MG tablet Take 1 tablet by mouth daily. 90 tablet 3   Bacillus Coagulans-Inulin (PROBIOTIC) 1-250 BILLION-MG CAPS Take 1 capsule by mouth daily.     cyanocobalamin (,VITAMIN B-12,) 1000 MCG/ML injection Inject 1 ml (1000 mcg) subcutaneously daily for 1 week, then weekly for 1 month, then once every 2 weeks 10 mL 6   dapagliflozin propanediol (FARXIGA) 10 MG TABS tablet Take 1 tablet by mouth once a day 30 tablet 5   escitalopram (LEXAPRO) 20 MG tablet TAKE 2 TABLETS BY MOUTH ONCE A DAY. 180 tablet 3   hyoscyamine (LEVBID) 0.375 MG 12  hr tablet Take 1 tablet  by mouth as needed as directed (30 days). 30 tablet 1   inFLIXimab (REMICADE) 100 MG injection every 8 (eight) weeks.     Insulin Pen Needle 32G X 4 MM MISC by Does not apply route. BD U/F Pen Needles Nano 4 mm x 32 G; use with insulin     insulin regular human CONCENTRATED (HUMULIN R U-500 KWIKPEN) 500 UNIT/ML kwikpen Inject 90 units under the skin at lunch and 40 units at evening meal 24 mL 3   LORazepam (ATIVAN) 1 MG tablet Take 1/2 tablet by mouth once daily as needed 20 tablet 1   losartan (COZAAR) 50 MG tablet Take 50 mg by mouth daily.     medroxyPROGESTERone (DEPO-PROVERA) 150 MG/ML injection Inject 150 mg into the muscle every 3 (three) months.     medroxyPROGESTERone Acetate (DEPO-PROVERA) 150 MG/ML SUSY Inject 1 mL into the muscle every 3 months 1 mL 4   MELATONIN PO Take 20 capsules by mouth daily.     neomycin-polymyxin b-dexamethasone (MAXITROL) 3.5-10000-0.1 SUSP SMARTSIG:In Eye(s)     nystatin-triamcinolone (MYCOLOG II) cream APPLY TO THE AFFECTED AREA(S) TWO TIMES DAILY AS DIRECTED 20 g 2   pantoprazole (PROTONIX) 40 MG tablet Take 1 tablet (40 mg total) by mouth 2 (two) times daily. 60 tablet 1   promethazine (PHENERGAN) 25 MG tablet TAKE 1 TABLET BY MOUTH EVERY 8 HOURS 90 tablet 1   repaglinide (PRANDIN) 1 MG tablet Take 1 tablet (1 mg total) by mouth 3 (three) times daily before meals. 270 tablet 1   Semaglutide,0.25 or 0.5MG/DOS, (OZEMPIC, 0.25 OR 0.5 MG/DOSE,) 2 MG/1.5ML SOPN Inject 0.25 mg once a week for 4 weeks then increase to 0.5 mg once a week subcutaneously 1.5 mL 11   spironolactone (ALDACTONE) 50 MG tablet Take 1 tablet by mouth every day. 30 tablet 11   SYRINGE-NEEDLE, DISP, 3 ML 25G X 5/8" 3 ML MISC Use to inject B12 under the skin 50 each 3   Vitamin D, Ergocalciferol, (DRISDOL) 1.25 MG (50000 UNIT) CAPS capsule Take 1 capsule (50,000 Units total) by mouth every 7 (seven) days. 8 capsule 0   Continuous Blood Gluc Sensor (DEXCOM G6 SENSOR)  MISC Change sensor every 10 days 9 each 4   Continuous Blood Gluc Transmit (DEXCOM G6 TRANSMITTER) MISC Use as directed for 90 days 1 each 4   No current facility-administered medications for this visit.     Review of Systems  Please see the history of present illness.     All other systems reviewed and are otherwise negative except as noted above.  Physical Exam    Wt Readings from Last 3 Encounters:  01/17/22 151 lb 6.4 oz (68.7 kg)  12/31/21 147 lb 3.2 oz (66.8 kg)  12/22/21 152 lb (68.9 kg)   VS:  Vitals:   01/17/22 1531  BP: 124/78  Pulse:  61   GEN: Well nourished, well developed, in no acute distress. Neck: Supple, no JVD, carotid bruits, or masses. Cardiac: S1,S2,RRR, no murmurs, rubs, or gallops. No clubbing, cyanosis. Radials/PT 2+ and equal bilaterally.  Respiratory:  Respirations regular and unlabored, clear to auscultation bilaterally. MS: no deformity or atrophy. Skin: warm and dry, no rash. Neuro:  Strength and sensation are intact. Psych: Normal affect.   Accessory Clinical Findings EKG/Labs/Other Studies Reviewed    Cardiac CTA 11/26/2021 IMPRESSION: 1. Coronary calcium score of 1.6. This was 92nd percentile for age-, sex, and race-matched controls.   2. Normal coronary origin with right dominance.   3. Minimal mixed density plaque (<25%) in the proximal LAD.   4. Small diverticulum in the basal inferior segment. Suspect this is benign finding. ECG personally reviewed by  None performed today   Risk Assessment/Calculations:     Lab Results  Component Value Date   WBC 13.3 (H) 12/20/2021   HGB 16.1 (H) 12/20/2021   HCT 49.6 (H) 12/20/2021   MCV 82.4 12/20/2021   PLT 247 12/20/2021   Lab Results  Component Value Date   CREATININE 0.55 12/20/2021   BUN 9 12/20/2021   NA 137 12/20/2021   K 4.3 12/20/2021   CL 103 12/20/2021   CO2 21 (L) 12/20/2021   Lab Results  Component Value Date   ALT 21 12/20/2021   AST 14 (L) 12/20/2021    ALKPHOS 71 12/20/2021   BILITOT 1.6 (H) 12/20/2021   No results found for: CHOL, HDL, LDLCALC, LDLDIRECT, TRIG, CHOLHDL  Lab Results  Component Value Date   HGBA1C 13.7 (H) 07/16/2021    Assessment & Plan    1.  Chest pressure/CAD/Hyperlipidemia No additional chest pain or fatigue since last office visit in January. She attributes this to decreased stress and dietary changes. -Continue risk factor modification with diet and exercise. -Atorvastatin increased to 20 mg -Please take 81 mg ASA EC daily -LFTs and lipids rechecked in 8 weeks  2.  Palpitations: -3 days ZIO patch revealed no arrhythmias or frequent sustained PVCs -Palpitations Resolved -Continue to avoid dietary triggers such as caffeine, energy drinks, chocolate  3.  Hypertension: -Blood pressure today was well controlled at 124/78 -Continue spirolactone and losartan  Disposition: Follow-up with Dr. Oval Linsey in 6 month  Medication Adjustments/Labs and Tests Ordered: Current medicines are reviewed at length with the patient today.  Concerns regarding medicines are outlined above.  Tests Ordered: Orders Placed This Encounter  Procedures   Lipid panel   Hepatic function panel   Medication Changes: Meds ordered this encounter  Medications   atorvastatin (LIPITOR) 20 MG tablet    Sig: Take 1 tablet by mouth daily.    Dispense:  90 tablet    Refill:  3    Order Specific Question:   Supervising Provider    Answer:   Buford Dresser [[id number]]   aspirin EC 81 MG tablet    Sig: Take 1 tablet (81 mg total) by mouth daily. Swallow whole.    Dispense:  90 tablet    Refill:  3    Order Specific Question:   Supervising Provider    Answer:   Buford Dresser [[id number]]    Mable Fill, Marissa Nestle, NP 01/17/2022, 4:21 PM

## 2022-01-17 ENCOUNTER — Other Ambulatory Visit: Payer: Self-pay

## 2022-01-17 ENCOUNTER — Encounter: Payer: Self-pay | Admitting: Internal Medicine

## 2022-01-17 ENCOUNTER — Encounter (HOSPITAL_BASED_OUTPATIENT_CLINIC_OR_DEPARTMENT_OTHER): Payer: Self-pay | Admitting: Nurse Practitioner

## 2022-01-17 ENCOUNTER — Ambulatory Visit (HOSPITAL_BASED_OUTPATIENT_CLINIC_OR_DEPARTMENT_OTHER): Payer: 59 | Admitting: Nurse Practitioner

## 2022-01-17 ENCOUNTER — Other Ambulatory Visit (HOSPITAL_COMMUNITY): Payer: Self-pay

## 2022-01-17 VITALS — BP 124/78 | HR 84 | Ht 66.0 in | Wt 151.4 lb

## 2022-01-17 DIAGNOSIS — I10 Essential (primary) hypertension: Secondary | ICD-10-CM | POA: Diagnosis not present

## 2022-01-17 DIAGNOSIS — R002 Palpitations: Secondary | ICD-10-CM | POA: Diagnosis not present

## 2022-01-17 DIAGNOSIS — R0789 Other chest pain: Secondary | ICD-10-CM | POA: Diagnosis not present

## 2022-01-17 DIAGNOSIS — E785 Hyperlipidemia, unspecified: Secondary | ICD-10-CM | POA: Diagnosis not present

## 2022-01-17 DIAGNOSIS — I2510 Atherosclerotic heart disease of native coronary artery without angina pectoris: Secondary | ICD-10-CM | POA: Diagnosis not present

## 2022-01-17 MED ORDER — ATORVASTATIN CALCIUM 20 MG PO TABS
20.0000 mg | ORAL_TABLET | Freq: Every day | ORAL | 3 refills | Status: DC
  Filled 2022-01-17: qty 90, 90d supply, fill #0
  Filled 2022-05-26: qty 90, 90d supply, fill #1
  Filled 2022-08-21: qty 90, 90d supply, fill #2
  Filled 2022-11-19: qty 90, 90d supply, fill #3

## 2022-01-17 MED ORDER — ASPIRIN EC 81 MG PO TBEC: 81.0000 mg | DELAYED_RELEASE_TABLET | Freq: Every day | ORAL | 3 refills | Status: AC

## 2022-01-17 MED ORDER — DEXCOM G6 TRANSMITTER MISC
4 refills | Status: DC
  Filled 2022-01-17: qty 1, 90d supply, fill #0

## 2022-01-17 MED ORDER — DEXCOM G6 SENSOR MISC
4 refills | Status: DC
  Filled 2022-01-17: qty 9, 90d supply, fill #0

## 2022-01-17 NOTE — Assessment & Plan Note (Signed)
Exacerbation due to recent food poisoning.  Steroid taper.  Stayed home for longer ?

## 2022-01-17 NOTE — Patient Instructions (Addendum)
Medication Instructions:  ?Your physician has recommended you make the following change in your medication:  ? ?CHANGE Atorvastatin to 58m daily ? ?START Aspirin EC 837mdaily  ? ?*If you need a refill on your cardiac medications before your next appointment, please call your pharmacy* ? ? ?Lab Work: ?Your physician recommends that you return for lab work in 8 weeks for fasting lipid panel and liver function tests ? ?Please return for Lab work. You may come to the...  ? ?DrAlexander3rd floor) ?[address]?Open: 8am-Noon and 1pm-4:30pm  ? ?[place] [facility] [place]? ?[place] [facility] [facility] location ? ?**no appointments needed**  ? ?If you have labs (blood work) drawn today and your tests are completely normal, you will receive your results only by: ?MyChart Message (if you have MyChart) OR ?A paper copy in the mail ?If you have any lab test that is abnormal or we need to change your treatment, we will call you to review the results. ? ? ?Testing/Procedures: ?Your cardiac CTA showed mild non obstructive coronary disease. We prevent this from worsening with medications like Atorvastatin and Aspirin. We also prevent this from worsening by eating a heart healthy diet and exercising regularly.  ? ?Follow-Up: ?[facility] our [facility].  As part of our continuing mission to provide you with exceptional heart care, we have created designated Provider Care Teams.  These Care Teams include your primary Cardiologist (physician) and Advanced Practice Providers (APPs -  Physician Assistants and Nurse Practitioners) who all work together to provide you with the care you need, when you need it. ? ?We recommend signing up for the patient portal called "MyChart".  Sign up information is provided on this After Visit Summary.  MyChart is used to connect with patients for Virtual Visits (Telemedicine).  Patients  are able to view lab/test results, encounter notes, upcoming appointments, etc.  Non-urgent messages can be sent to your provider as well.   ?To learn more about what you can do with MyChart, go to [email or web address]  ? ?Your next appointment:   ?Follow up as scheduled with Dr. RaOval Linsey? ?Other Instructions ? ?Heart Healthy Diet Recommendations: ?A low-salt diet is recommended. Meats should be grilled, baked, or boiled. Avoid fried foods. Focus on lean protein sources like fish or chicken with vegetables and fruits. The American Heart Association is a GRMicrobiologist American Heart Association Diet and Lifeystyle Recommendations   ? ?Exercise recommendations: ?The American Heart Association recommends 150 minutes of moderate intensity exercise weekly. ?Try 30 minutes of moderate intensity exercise 4-5 times per week. ?This could include walking, jogging, or swimming. ? ?For coronary artery disease often called "heart disease" we aim for optimal guideline directed medical therapy. We use the "A, B, C"s to help keep usKorean track! ? ?A = Aspirin 815maily ?B = Blood pressure control. ?C = Cholesterol control. You take Atorvastatin to help control your cholesterol.  ? ? ?

## 2022-01-17 NOTE — Assessment & Plan Note (Signed)
On Farxiga, Ozempic, insulin ?Hydrate well. ?Monitor CBGs at home ?

## 2022-01-17 NOTE — Assessment & Plan Note (Signed)
Worse.  Continue with pantoprazole ?

## 2022-01-17 NOTE — Assessment & Plan Note (Signed)
Continue with vitamin B12.  Gluten-free diet trial ?

## 2022-01-18 ENCOUNTER — Other Ambulatory Visit (HOSPITAL_COMMUNITY): Payer: Self-pay

## 2022-01-18 DIAGNOSIS — I10 Essential (primary) hypertension: Secondary | ICD-10-CM | POA: Diagnosis not present

## 2022-01-18 DIAGNOSIS — Z794 Long term (current) use of insulin: Secondary | ICD-10-CM | POA: Diagnosis not present

## 2022-01-18 DIAGNOSIS — I2510 Atherosclerotic heart disease of native coronary artery without angina pectoris: Secondary | ICD-10-CM | POA: Diagnosis not present

## 2022-01-18 DIAGNOSIS — E1165 Type 2 diabetes mellitus with hyperglycemia: Secondary | ICD-10-CM | POA: Diagnosis not present

## 2022-01-18 MED ORDER — OZEMPIC (0.25 OR 0.5 MG/DOSE) 2 MG/3ML ~~LOC~~ SOPN
PEN_INJECTOR | SUBCUTANEOUS | 4 refills | Status: DC
  Filled 2022-01-18: qty 9, 90d supply, fill #0
  Filled 2022-05-26: qty 9, 90d supply, fill #1

## 2022-01-19 ENCOUNTER — Other Ambulatory Visit (HOSPITAL_COMMUNITY): Payer: Self-pay

## 2022-01-28 ENCOUNTER — Other Ambulatory Visit (HOSPITAL_COMMUNITY): Payer: Self-pay

## 2022-01-28 DIAGNOSIS — G5601 Carpal tunnel syndrome, right upper limb: Secondary | ICD-10-CM | POA: Diagnosis not present

## 2022-01-31 DIAGNOSIS — M2550 Pain in unspecified joint: Secondary | ICD-10-CM | POA: Diagnosis not present

## 2022-01-31 DIAGNOSIS — K5010 Crohn's disease of large intestine without complications: Secondary | ICD-10-CM | POA: Diagnosis not present

## 2022-02-02 DIAGNOSIS — K5010 Crohn's disease of large intestine without complications: Secondary | ICD-10-CM | POA: Diagnosis not present

## 2022-02-07 DIAGNOSIS — Z3200 Encounter for pregnancy test, result unknown: Secondary | ICD-10-CM | POA: Diagnosis not present

## 2022-02-07 DIAGNOSIS — Z3042 Encounter for surveillance of injectable contraceptive: Secondary | ICD-10-CM | POA: Diagnosis not present

## 2022-02-08 DIAGNOSIS — Z3042 Encounter for surveillance of injectable contraceptive: Secondary | ICD-10-CM | POA: Diagnosis not present

## 2022-02-08 NOTE — Progress Notes (Signed)
?Cindy Long D.O. ?[facility] ?[place] ?Phone: [phone] ?Subjective:   ?I, Cindy Long, am serving as a scribe for Dr. Hulan Saas.This visit occurred during the SARS-CoV-2 public health emergency.  Safety protocols were in place, including screening questions prior to the visit, additional usage of staff PPE, and extensive cleaning of exam room while observing appropriate contact time as indicated for disinfecting solutions.  ?I'm seeing this patient by the request  of:  Plotnikov, Evie Lacks, MD ? ?CC: right ankle pain  ? ?[id number]  ?12/22/2021 ?Seems to be secondary to peripheral neuropathy likely secondary to patient's hyperglycemia. ?Discussed with patient the importance of keeping her blood sugar under control.  Discussed the B12 injections as well as the B6 to help with absorption. ? ?Patient does have some right ankle sprain noted.  Discussed with patient icing regimen and home exercises, discussed with patient..  Discussed icing regimen home exercises increase activity slowly.  I do believe patient will heal well ? ?Update 02/09/2022 ?Cindy Long is a 46 y.o. female coming in with complaint of R ankle sprain.  Patient states that the ankle is 100% better at this time. ? ?Patient was found to have significant knee difficulties previously as well.  This seems to have more secondary to her blood sugars though we have avoided steroid injections.  Patient feels like the knees are starting to give her more discomfort again. ? ?  ? ?Past Medical History:  ?Diagnosis Date  ? Chest pressure 11/18/2021  ? Crohn disease ([place])   ? Diabetes mellitus   ? IDDM  ? Essential hypertension 11/18/2021  ? GERD (gastroesophageal reflux disease)   ? no meds currently  ? High cholesterol   ? Hypertension   ? Mastitis   ? right breast  ? Postpartum care following cesarean delivery (2/10) 12/17/2013  ? Pure hypercholesterolemia 11/18/2021  ? ?Past Surgical History:  ?Procedure  Laterality Date  ? CERVICAL CERCLAGE    ? CERVICAL CERCLAGE N/A 06/21/2013  ? Procedure: McDonald CERCLAGE CERVICAL;  Surgeon: Marvene Staff, MD;  Location: [facility];  Service: Gynecology;  Laterality: N/A;  ? CESAREAN SECTION    ? CESAREAN SECTION N/A 12/17/2013  ? Procedure: Repeat CESAREAN SECTION with Cerclage Removal;  Surgeon: Marvene Staff, MD;  Location: [facility];  Service: Obstetrics;  Laterality: N/A;  EDD: 12/22/13  ? CHOLECYSTECTOMY    ? CHOLECYSTECTOMY OPEN  08/2011  ? 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 ?Social History  ? ?Socioeconomic History  ? Marital status: Married  ?  Spouse name: Not on file  ? Number of children: Not on file  ? Years of education: Not on file  ? Highest education level: Not on file  ?Occupational History  ? Not on file  ?Tobacco Use  ? Smoking status: Never  ? Smokeless tobacco: Never  ?Substance and Sexual Activity  ? Alcohol use: Yes  ?  Comment: occasionally  ? Drug use: No  ? Sexual activity: Not on file  ?Other Topics Concern  ? Not on file  ?Social History Narrative  ? Not on file  ? ?Social Determinants of Health  ? ?Financial Resource Strain: Low Risk   ? Difficulty of Paying Living Expenses: Not hard at all  ?Food Insecurity: No Food Insecurity  ? Worried About Charity fundraiser in the Last Year: Never true  ? Ran Out of Food in the Last Year: Never true  ?Transportation Needs: No Transportation Needs  ?  Lack of Transportation (Medical): No  ? Lack of Transportation (Non-Medical): No  ?Physical Activity: Inactive  ? Days of Exercise per Week: 0 days  ? Minutes of Exercise per Session: 0 min  ?Stress: Not on file  ?Social Connections: Not on file  ? ?Allergies  ?Allergen Reactions  ? Metronidazole Swelling  ?  Throat swelling  ? Shellfish Allergy Swelling  ?  Swelling of the throat  ? ?Family History  ?Problem Relation Age of Onset  ? Hypertension Mother   ? Diabetes Maternal Aunt   ? Hypertension Maternal Grandmother   ? Cancer Maternal  Grandmother   ?     breast, colon  ? Diabetes Maternal Grandmother   ? Hypertension Maternal Grandfather   ? Heart attack Cousin 42  ? ? ?Current Outpatient Medications (Endocrine & Metabolic):  ?  dapagliflozin propanediol (FARXIGA) 10 MG TABS tablet, Take 1 tablet by mouth once a day ?  insulin regular human CONCENTRATED (HUMULIN R U-500 KWIKPEN) 500 UNIT/ML kwikpen, Inject 90 units under the skin at lunch and 40 units at evening meal ?  medroxyPROGESTERone (DEPO-PROVERA) 150 MG/ML injection, Inject 150 mg into the muscle every 3 (three) months. ?  medroxyPROGESTERone Acetate (DEPO-PROVERA) 150 MG/ML SUSY, Inject 1 mL into the muscle every 3 months ?  repaglinide (PRANDIN) 1 MG tablet, Take 1 tablet (1 mg total) by mouth 3 (three) times daily before meals. ?  Semaglutide,0.25 or 0.5MG/DOS, (OZEMPIC, 0.25 OR 0.5 MG/DOSE,) 2 MG/1.5ML SOPN, Inject 0.25 mg once a week for 4 weeks then increase to 0.5 mg once a week subcutaneously ?  Semaglutide,0.25 or 0.5MG/DOS, (OZEMPIC, 0.25 OR 0.5 MG/DOSE,) 2 MG/3ML SOPN, Inject 0.25 mg into the skin once a week for four weeks, then 0.5 mg once a week ? ?Current Outpatient Medications (Cardiovascular):  ?  atorvastatin (LIPITOR) 20 MG tablet, Take 1 tablet by mouth daily. ?  losartan (COZAAR) 50 MG tablet, Take 50 mg by mouth daily. ?  spironolactone (ALDACTONE) 50 MG tablet, Take 1 tablet by mouth every day. ? ?Current Outpatient Medications (Respiratory):  ?  albuterol (PROVENTIL HFA;VENTOLIN HFA) 108 (90 Base) MCG/ACT inhaler, Inhale 1-2 puffs into the lungs every 6 (six) hours as needed for wheezing or shortness of breath. ?  promethazine (PHENERGAN) 25 MG tablet, TAKE 1 TABLET BY MOUTH EVERY 8 HOURS ? ?Current Outpatient Medications (Analgesics):  ?  aspirin EC 81 MG tablet, Take 1 tablet (81 mg total) by mouth daily. Swallow whole. ? ?Current Outpatient Medications (Hematological):  ?  cyanocobalamin (,VITAMIN B-12,) 1000 MCG/ML injection, Inject 1 ml (1000 mcg)  subcutaneously daily for 1 week, then weekly for 1 month, then once every 2 weeks ? ?Current Outpatient Medications (Other):  ?  Bacillus Coagulans-Inulin (PROBIOTIC) 1-250 BILLION-MG CAPS, Take 1 capsule by mouth daily. ?  Continuous Blood Gluc Sensor (DEXCOM G6 SENSOR) MISC, Change sensor every 10 days ?  Continuous Blood Gluc Transmit (DEXCOM G6 TRANSMITTER) MISC, Use as directed for 90 days ?  escitalopram (LEXAPRO) 20 MG tablet, TAKE 2 TABLETS BY MOUTH ONCE A DAY. ?  hyoscyamine (LEVBID) 0.375 MG 12 hr tablet, Take 1 tablet  by mouth as needed as directed (30 days). ?  inFLIXimab (REMICADE) 100 MG injection, every 8 (eight) weeks. ?  Insulin Pen Needle 32G X 4 MM MISC, by Does not apply route. BD U/F Pen Needles Nano 4 mm x 32 G; use with insulin ?  LORazepam (ATIVAN) 1 MG tablet, Take 1/2 tablet by mouth once daily as needed ?  MELATONIN PO, Take 20 capsules by mouth daily. ?  neomycin-polymyxin b-dexamethasone (MAXITROL) 3.5-10000-0.1 SUSP, SMARTSIG:In Eye(s) ?  nystatin-triamcinolone (MYCOLOG II) cream, APPLY TO THE AFFECTED AREA(S) TWO TIMES DAILY AS DIRECTED ?  pantoprazole (PROTONIX) 40 MG tablet, Take 1 tablet (40 mg total) by mouth 2 (two) times daily. ?  SYRINGE-NEEDLE, DISP, 3 ML 25G X 5/8" 3 ML MISC, Use to inject B12 under the skin ?  Vitamin D, Ergocalciferol, (DRISDOL) 1.25 MG (50000 UNIT) CAPS capsule, Take 1 capsule (50,000 Units total) by mouth every 7 (seven) days. ? ? ?Reviewed prior external information including notes and imaging from  ?primary care provider ?As well as notes that were available from care everywhere and other healthcare systems. ? ?Past medical history, social, surgical and family history all reviewed in electronic medical record.  No pertanent information unless stated regarding to the chief complaint.  ? ?Review of Systems: ? No headache, visual changes, nausea, vomiting, diarrhea, constipation, dizziness, abdominal pain, skin rash, fevers, chills, night sweats, weight  loss, swollen lymph nodes, body aches, joint swelling, chest pain, shortness of breath, mood changes. POSITIVE muscle aches ? ?Objective  ?Blood pressure 102/72, pulse 92, height 5' 6"  (1.676 m), weight 154 lb (69.9 kg), S

## 2022-02-09 ENCOUNTER — Ambulatory Visit: Payer: 59 | Admitting: Family Medicine

## 2022-02-09 ENCOUNTER — Encounter: Payer: Self-pay | Admitting: Family Medicine

## 2022-02-09 ENCOUNTER — Ambulatory Visit: Payer: Self-pay

## 2022-02-09 VITALS — BP 102/72 | HR 92 | Ht 66.0 in | Wt 154.0 lb

## 2022-02-09 DIAGNOSIS — M25571 Pain in right ankle and joints of right foot: Secondary | ICD-10-CM | POA: Diagnosis not present

## 2022-02-09 DIAGNOSIS — S93491A Sprain of other ligament of right ankle, initial encounter: Secondary | ICD-10-CM

## 2022-02-09 DIAGNOSIS — M1710 Unilateral primary osteoarthritis, unspecified knee: Secondary | ICD-10-CM | POA: Diagnosis not present

## 2022-02-09 NOTE — Assessment & Plan Note (Signed)
Right ankle exam shows no significant pain at this point.  Patient can follow-up with me as needed for this. ?

## 2022-02-09 NOTE — Assessment & Plan Note (Signed)
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  Patient does have some muscle atrophy secondary to the high blood sugars.  We did discuss the possibility of the steroid injections but secondary to patient's blood sugars being around 250 at the moment we decided it would be better to try the viscosupplementation again.  We will attempt to get approval in the near future.  Patient will follow-up at that time and we will try again and hoping that this will make more improvement. ?

## 2022-02-09 NOTE — Patient Instructions (Signed)
See me after May 15th ?Will get approval for gel ?

## 2022-02-14 ENCOUNTER — Other Ambulatory Visit (HOSPITAL_COMMUNITY): Payer: Self-pay

## 2022-02-15 ENCOUNTER — Encounter: Payer: Self-pay | Admitting: Family Medicine

## 2022-02-15 ENCOUNTER — Other Ambulatory Visit: Payer: Self-pay

## 2022-02-15 DIAGNOSIS — G5601 Carpal tunnel syndrome, right upper limb: Secondary | ICD-10-CM

## 2022-02-17 ENCOUNTER — Telehealth: Payer: Self-pay

## 2022-02-17 NOTE — Telephone Encounter (Signed)
Pt is wanting t know if you recommend her taking the Prevnar 20 due to her health issues with weakened immune system due to Crohn's Disease and being on Remicade? ?

## 2022-02-18 NOTE — Telephone Encounter (Signed)
Technically it is due in 2025.  Since Cindy Long is on Remicade, we can do Prevnar 20 now.  Thanks ?

## 2022-02-18 NOTE — Telephone Encounter (Signed)
Called pt no answer LMOM w/MD response.Marland KitchenJohny Long ?

## 2022-02-23 ENCOUNTER — Other Ambulatory Visit (HOSPITAL_COMMUNITY): Payer: Self-pay

## 2022-02-23 ENCOUNTER — Ambulatory Visit: Payer: 59 | Admitting: Internal Medicine

## 2022-02-23 ENCOUNTER — Encounter: Payer: Self-pay | Admitting: Internal Medicine

## 2022-02-23 DIAGNOSIS — J4521 Mild intermittent asthma with (acute) exacerbation: Secondary | ICD-10-CM | POA: Diagnosis not present

## 2022-02-23 DIAGNOSIS — J069 Acute upper respiratory infection, unspecified: Secondary | ICD-10-CM | POA: Diagnosis not present

## 2022-02-23 DIAGNOSIS — E1165 Type 2 diabetes mellitus with hyperglycemia: Secondary | ICD-10-CM | POA: Diagnosis not present

## 2022-02-23 DIAGNOSIS — I2510 Atherosclerotic heart disease of native coronary artery without angina pectoris: Secondary | ICD-10-CM | POA: Insufficient documentation

## 2022-02-23 DIAGNOSIS — Z794 Long term (current) use of insulin: Secondary | ICD-10-CM | POA: Diagnosis not present

## 2022-02-23 DIAGNOSIS — K50819 Crohn's disease of both small and large intestine with unspecified complications: Secondary | ICD-10-CM | POA: Diagnosis not present

## 2022-02-23 DIAGNOSIS — J45901 Unspecified asthma with (acute) exacerbation: Secondary | ICD-10-CM | POA: Insufficient documentation

## 2022-02-23 LAB — POC COVID19 BINAXNOW: SARS Coronavirus 2 Ag: NEGATIVE

## 2022-02-23 MED ORDER — FLUCONAZOLE 150 MG PO TABS
150.0000 mg | ORAL_TABLET | Freq: Once | ORAL | 0 refills | Status: AC
  Filled 2022-02-23: qty 1, 1d supply, fill #0

## 2022-02-23 MED ORDER — HYDROCODONE BIT-HOMATROP MBR 5-1.5 MG/5ML PO SOLN
5.0000 mL | Freq: Three times a day (TID) | ORAL | 0 refills | Status: DC | PRN
  Filled 2022-02-23: qty 240, 16d supply, fill #0

## 2022-02-23 MED ORDER — CEFDINIR 300 MG PO CAPS
300.0000 mg | ORAL_CAPSULE | Freq: Two times a day (BID) | ORAL | 0 refills | Status: DC
  Filled 2022-02-23: qty 20, 10d supply, fill #0

## 2022-02-23 NOTE — Assessment & Plan Note (Signed)
Worse ?Treat URI ?Use Ventoin MDI QID ?

## 2022-02-23 NOTE — Assessment & Plan Note (Signed)
Continuous CBG monitoring ?

## 2022-02-23 NOTE — Assessment & Plan Note (Signed)
Try gluten free diet 

## 2022-02-23 NOTE — Progress Notes (Signed)
? ?Subjective:  ?Patient ID: Cindy Long, female    DOB: 11-26-1975  Age: 46 y.o. MRN: [id number] ? ?CC: No chief complaint on file. ? ? ?HPI ?Cindy Long presents for ST, sinus pain, coughing a lot x2 d ? ?Outpatient Medications Prior to Visit  ?Medication Sig Dispense Refill  ? albuterol (PROVENTIL HFA;VENTOLIN HFA) 108 (90 Base) MCG/ACT inhaler Inhale 1-2 puffs into the lungs every 6 (six) hours as needed for wheezing or shortness of breath.    ? aspirin EC 81 MG tablet Take 1 tablet (81 mg total) by mouth daily. Swallow whole. 90 tablet 3  ? atorvastatin (LIPITOR) 20 MG tablet Take 1 tablet by mouth daily. 90 tablet 3  ? Bacillus Coagulans-Inulin (PROBIOTIC) 1-250 BILLION-MG CAPS Take 1 capsule by mouth daily.    ? Continuous Blood Gluc Sensor (DEXCOM G6 SENSOR) MISC Change sensor every 10 days 9 each 4  ? Continuous Blood Gluc Transmit (DEXCOM G6 TRANSMITTER) MISC Use as directed for 90 days 1 each 4  ? cyanocobalamin (,VITAMIN B-12,) 1000 MCG/ML injection Inject 1 ml (1000 mcg) subcutaneously daily for 1 week, then weekly for 1 month, then once every 2 weeks 10 mL 6  ? dapagliflozin propanediol (FARXIGA) 10 MG TABS tablet Take 1 tablet by mouth once a day 30 tablet 5  ? escitalopram (LEXAPRO) 20 MG tablet TAKE 2 TABLETS BY MOUTH ONCE A DAY. 180 tablet 3  ? hyoscyamine (LEVBID) 0.375 MG 12 hr tablet Take 1 tablet  by mouth as needed as directed (30 days). 30 tablet 1  ? inFLIXimab (REMICADE) 100 MG injection every 8 (eight) weeks.    ? Insulin Pen Needle 32G X 4 MM MISC by Does not apply route. BD U/F Pen Needles Nano 4 mm x 32 G; use with insulin    ? insulin regular human CONCENTRATED (HUMULIN R U-500 KWIKPEN) 500 UNIT/ML kwikpen Inject 90 units under the skin at lunch and 40 units at evening meal 24 mL 3  ? LORazepam (ATIVAN) 1 MG tablet Take 1/2 tablet by mouth once daily as needed 20 tablet 1  ? losartan (COZAAR) 50 MG tablet Take 50 mg by mouth daily.    ? medroxyPROGESTERone (DEPO-PROVERA)  150 MG/ML injection Inject 150 mg into the muscle every 3 (three) months.    ? medroxyPROGESTERone Acetate (DEPO-PROVERA) 150 MG/ML SUSY Inject 1 mL into the muscle every 3 months 1 mL 4  ? MELATONIN PO Take 20 capsules by mouth daily.    ? neomycin-polymyxin b-dexamethasone (MAXITROL) 3.5-10000-0.1 SUSP SMARTSIG:In Eye(s)    ? nystatin-triamcinolone (MYCOLOG II) cream APPLY TO THE AFFECTED AREA(S) TWO TIMES DAILY AS DIRECTED 20 g 2  ? pantoprazole (PROTONIX) 40 MG tablet Take 1 tablet (40 mg total) by mouth 2 (two) times daily. 60 tablet 1  ? promethazine (PHENERGAN) 25 MG tablet TAKE 1 TABLET BY MOUTH EVERY 8 HOURS 90 tablet 1  ? repaglinide (PRANDIN) 1 MG tablet Take 1 tablet (1 mg total) by mouth 3 (three) times daily before meals. 270 tablet 1  ? Semaglutide,0.25 or 0.5MG/DOS, (OZEMPIC, 0.25 OR 0.5 MG/DOSE,) 2 MG/3ML SOPN Inject 0.25 mg into the skin once a week for four weeks, then 0.5 mg once a week 9 mL 4  ? spironolactone (ALDACTONE) 50 MG tablet Take 1 tablet by mouth every day. 30 tablet 11  ? SYRINGE-NEEDLE, DISP, 3 ML 25G X 5/8" 3 ML MISC Use to inject B12 under the skin 50 each 3  ? Vitamin D, Ergocalciferol, (DRISDOL)  1.25 MG (50000 UNIT) CAPS capsule Take 1 capsule (50,000 Units total) by mouth every 7 (seven) days. 8 capsule 0  ? ?No facility-administered medications prior to visit.  ? ? ?ROS: ?Review of Systems  ?Constitutional:  Positive for chills and fatigue. Negative for activity change, appetite change, fever and unexpected weight change.  ?HENT:  Positive for congestion, ear pain, postnasal drip, rhinorrhea, sinus pressure and sinus pain. Negative for mouth sores.   ?Eyes:  Negative for visual disturbance.  ?Respiratory:  Positive for cough, shortness of breath and wheezing. Negative for chest tightness.   ?Gastrointestinal:  Negative for abdominal pain and nausea.  ?Genitourinary:  Negative for difficulty urinating, frequency and vaginal pain.  ?Musculoskeletal:  Negative for back pain and  gait problem.  ?Skin:  Negative for pallor and rash.  ?Neurological:  Negative for dizziness, tremors, weakness, numbness and headaches.  ?Psychiatric/Behavioral:  Negative for confusion and sleep disturbance.   ? ?Objective:  ?BP 140/78 (BP Location: Left Arm, Patient Position: Sitting, Cuff Size: Normal)   Pulse 91   Temp 98.3 ?F (36.8 ?C) (Oral)   Ht 5' 6"  (1.676 m)   Wt 155 lb (70.3 kg)   SpO2 99%   BMI 25.02 kg/m?  ? ?BP Readings from Last 3 Encounters:  ?02/23/22 140/78  ?02/09/22 102/72  ?01/17/22 124/78  ? ? ?Wt Readings from Last 3 Encounters:  ?02/23/22 155 lb (70.3 kg)  ?02/09/22 154 lb (69.9 kg)  ?01/17/22 151 lb 6.4 oz (68.7 kg)  ? ? ?Physical Exam ?Constitutional:   ?   General: She is not in acute distress. ?   Appearance: She is well-developed. She is obese.  ?HENT:  ?   Head: Normocephalic.  ?   Right Ear: External ear normal. There is impacted cerumen.  ?   Left Ear: External ear normal. There is no impacted cerumen.  ?   Nose: Congestion and rhinorrhea present.  ?   Mouth/Throat:  ?   Pharynx: Posterior oropharyngeal erythema present. No oropharyngeal exudate.  ?Eyes:  ?   General:     ?   Right eye: No discharge.     ?   Left eye: No discharge.  ?   Conjunctiva/sclera: Conjunctivae normal.  ?   Pupils: Pupils are equal, round, and reactive to light.  ?Neck:  ?   Thyroid: No thyromegaly.  ?   Vascular: No JVD.  ?   Trachea: No tracheal deviation.  ?Cardiovascular:  ?   Rate and Rhythm: Normal rate and regular rhythm.  ?   Heart sounds: Normal heart sounds.  ?Pulmonary:  ?   Effort: No respiratory distress.  ?   Breath sounds: No stridor. No wheezing.  ?Abdominal:  ?   General: Bowel sounds are normal. There is no distension.  ?   Palpations: Abdomen is soft. There is no mass.  ?   Tenderness: There is no abdominal tenderness. There is no guarding or rebound.  ?Musculoskeletal:     ?   General: No tenderness.  ?   Cervical back: Normal range of motion and neck supple. No rigidity.   ?Lymphadenopathy:  ?   Cervical: No cervical adenopathy.  ?Skin: ?   Findings: No erythema or rash.  ?Neurological:  ?   Cranial Nerves: No cranial nerve deficit.  ?   Motor: No abnormal muscle tone.  ?   Coordination: Coordination normal.  ?   Deep Tendon Reflexes: Reflexes normal.  ?Psychiatric:     ?   Behavior: Behavior  normal.     ?   Thought Content: Thought content normal.     ?   Judgment: Judgment normal.  ? ? ?Lab Results  ?Component Value Date  ? WBC 13.3 (H) 12/20/2021  ? HGB 16.1 (H) 12/20/2021  ? HCT 49.6 (H) 12/20/2021  ? PLT 247 12/20/2021  ? GLUCOSE 340 (H) 12/20/2021  ? ALT 21 12/20/2021  ? AST 14 (L) 12/20/2021  ? NA 137 12/20/2021  ? K 4.3 12/20/2021  ? CL 103 12/20/2021  ? CREATININE 0.55 12/20/2021  ? BUN 9 12/20/2021  ? CO2 21 (L) 12/20/2021  ? TSH 0.690 11/18/2021  ? HGBA1C 13.7 (H) 07/16/2021  ? ? ?No results found. ? ?Assessment & Plan:  ? ?Problem List Items Addressed This Visit   ? ? Crohn's disease of small and large intestines ([place])  ?  Try gluten free diet ? ?  ?  ? Hyperglycemia due to type 2 diabetes mellitus ([place])  ?  Continuous CBG monitoring ? ?  ?  ? Asthmatic bronchitis with acute exacerbation  ?  Worse ?Treat URI ?Use Ventoin MDI QID ? ?  ?  ? Upper respiratory infection  ?  Start Cefdinir ?Diflucan if neded ?Hycodan prn  ?Stay at home for the rest of this week ? ?  ?  ? Relevant Medications  ? cefdinir (OMNICEF) 300 MG capsule  ? fluconazole (DIFLUCAN) 150 MG tablet  ? Other Relevant Orders  ? POC COVID-19 BinaxNow (Completed)  ?  ? ? ?Meds ordered this encounter  ?Medications  ? cefdinir (OMNICEF) 300 MG capsule  ?  Sig: Take 1 capsule (300 mg total) by mouth 2 (two) times daily.  ?  Dispense:  20 capsule  ?  Refill:  0  ? fluconazole (DIFLUCAN) 150 MG tablet  ?  Sig: Take 1 tablet (150 mg total) by mouth once for 1 dose.  ?  Dispense:  1 tablet  ?  Refill:  0  ? HYDROcodone bit-homatropine (HYCODAN) 5-1.5 MG/5ML syrup  ?  Sig: Take 5 mLs by mouth every 8 (eight) hours as  needed for cough.  ?  Dispense:  240 mL  ?  Refill:  0  ?  ? ? ?Follow-up: Return in about 3 months (around 05/25/2022) for a follow-up visit. ? ?Walker Kehr, MD ?

## 2022-02-23 NOTE — Assessment & Plan Note (Signed)
Start Cefdinir ?Diflucan if neded ?Hycodan prn  ?Stay at home for the rest of this week ?

## 2022-02-24 ENCOUNTER — Encounter: Payer: Self-pay | Admitting: Orthopedic Surgery

## 2022-02-24 ENCOUNTER — Ambulatory Visit: Payer: 59 | Admitting: Orthopedic Surgery

## 2022-02-24 DIAGNOSIS — R202 Paresthesia of skin: Secondary | ICD-10-CM

## 2022-02-24 DIAGNOSIS — R200 Anesthesia of skin: Secondary | ICD-10-CM | POA: Diagnosis not present

## 2022-02-24 NOTE — Progress Notes (Signed)
? ?Office Visit Note ?  ?Patient: Cindy Long           ?Date of Birth: 1976-10-27           ?MRN: [id number] ?Visit Date: 02/24/2022 ?             ?Requested by: Lyndal Pulley, DO ?[address] ?[place],  [place] [address] ?PCP: Plotnikov, Evie Lacks, MD ? ? ?Assessment & Plan: ?Visit Diagnoses:  ?1. Numbness and tingling in right hand   ? ? ?Plan: Discussed with patient that she does have some history and exam findings consistent with carpal tunnel syndrome.  We discussed the nature of carpal tunnel syndrome as well as its diagnosis, prognosis and both conservative and surgical treatment options.  Like to get an EMG/nerve conduction study to further evaluate her symptoms.  I also recommended a night splint given her nocturnal symptoms.  I can see her back in the office once the electrodiagnostic study is completed. ? ?Follow-Up Instructions: No follow-ups on file.  ? ?Orders:  ?No orders of the defined types were placed in this encounter. ? ?No orders of the defined types were placed in this encounter. ? ? ? ? Procedures: ?No procedures performed ? ? ?Clinical Data: ?No additional findings. ? ? ?Subjective: ?Chief Complaint  ?Patient presents with  ? Right Hand - New Patient (Initial Visit)  ? ? 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  Is been going on for several months now but has gotten progressively worse over the last several weeks.  States that she wakes up 3-4 nights a week with pain and numbness in her fingers and has to shake her hands for symptom relief.  This involves her index finger, middle finger, ring finger and small finger.  She also has some numbness and paresthesias during the day with certain activities such as typing or writing.  The pain will occasionally radiate proximally into her forearm.  She denies any injury to her wrist.  She did have a dorsal ganglion  removed 15 or more years ago.  She not had any treatment for this so far.  She not had any electrodiagnostic studies. ? ? ?Review of Systems ? ? ?Objective: ?Vital Signs: There were no vitals taken for this visit. ? ?Physical Exam ?Constitutional:   ?   Appearance: Normal appearance.  ?Cardiovascular:  ?   Rate and Rhythm: Normal rate.  ?   Pulses: Normal pulses.  ?Pulmonary:  ?   Effort: Pulmonary effort is normal.  ?Skin: ?   General: Skin is warm and dry.  ?   Capillary Refill: Capillary refill takes less than 2 seconds.  ?Neurological:  ?   Mental Status: She is alert.  ? ? ?Right Hand Exam  ? ?Tenderness  ?The patient is experiencing no tenderness.  ? ?Range of Motion  ?The patient has normal right wrist ROM.  ? ?Other  ?Erythema: absent ?Sensation: normal ?Pulse: present ? ?Comments:  Positive Durkan sign into middle finger at 5 seconds.  Negative Tinel and Phalen signs.  5/5 thenar motor strength without atrophy.  Negative Tinel at elbow.  5/5 FDI strength.  ? ? ? ? ?Specialty Comments:  ?No specialty comments available. ? ?Imaging: ?No results found. ? ? ?PMFS History: ?Patient Active Problem List  ? Diagnosis Date Noted  ? Numbness and tingling in right hand 02/24/2022  ? Coronary  artery disease 02/23/2022  ? Asthmatic bronchitis with acute exacerbation 02/23/2022  ? Upper respiratory infection 02/23/2022  ? Adjustment insomnia 12/22/2021  ? Other specified anxiety disorders 12/22/2021  ? Crohn's disease of small and large intestines ([place]) 12/22/2021  ? 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 Family history of breast cancer 12/22/2021  ? 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  ? Gastroesophageal reflux disease 12/22/2021  ? Hyperglycemia due to type 2 diabetes mellitus ([place]) 12/22/2021  ? Intra-abdominal and pelvic swelling, mass and lump, unspecified site 12/22/2021  ? Long term (current) use of insulin ([place]) 12/22/2021  ? Mild intermittent asthma with (acute) exacerbation 12/22/2021  ? Pain in thoracic spine  12/22/2021  ? Rectal bleeding 12/22/2021  ? Right ankle sprain 12/22/2021  ? Crohn's disease ([place]) 12/13/2021  ? Vitamin D deficiency 12/13/2021  ? B12 deficiency 12/13/2021  ? Depression 12/13/2021  ? Chest pressure 11/18/2021  ? Palpitations 11/18/2021  ? Essential hypertension 11/18/2021  ? Pure hypercholesterolemia 11/18/2021  ? 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 Patellofemoral disorder, right 07/16/2021  ? Muscle atrophy 07/16/2021  ? Postpartum care following cesarean delivery (2/10) 12/17/2013  ? Indication for care in labor or delivery 11/30/2013  ? Preexisting diabetes complicating pregnancy in second trimester, antepartum 08/18/2013  ? IBS (irritable bowel syndrome) 02/05/2013  ? Dyslipidemia 02/05/2013  ? Abnormal uterine bleeding 06/18/2012  ? Pelvic pain 06/18/2012  ? Type II diabetes mellitus ([place]) 06/18/2012  ? ?Past Medical History:  ?Diagnosis Date  ? Chest pressure 11/18/2021  ? Crohn disease ([place])   ? Diabetes mellitus   ? IDDM  ? Essential hypertension 11/18/2021  ? GERD (gastroesophageal reflux disease)   ? no meds currently  ? High cholesterol   ? Hypertension   ? Mastitis   ? right breast  ? Postpartum care following cesarean delivery (2/10) 12/17/2013  ? Pure hypercholesterolemia 11/18/2021  ?  ?Family History  ?Problem Relation Age of Onset  ? Hypertension Mother   ? Diabetes Maternal Aunt   ? Hypertension Maternal Grandmother   ? Cancer Maternal Grandmother   ?     breast, colon  ? Diabetes Maternal Grandmother   ? Hypertension Maternal Grandfather   ? Heart attack Cousin 42  ?  ?Past Surgical History:  ?Procedure Laterality Date  ? CERVICAL CERCLAGE    ? CERVICAL CERCLAGE N/A 06/21/2013  ? Procedure: [place] CERCLAGE CERVICAL;  Surgeon: Marvene Staff, MD;  Location: [facility];  Service: Gynecology;  Laterality: N/A;  ? CESAREAN SECTION    ? CESAREAN SECTION N/A 12/17/2013  ? Procedure: Repeat CESAREAN SECTION with Cerclage Removal;  Surgeon: Marvene Staff, MD;  Location: [facility];   Service: Obstetrics;  Laterality: N/A;  EDD: 12/22/13  ? CHOLECYSTECTOMY    ? CHOLECYSTECTOMY OPEN  08/2011  ? 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 ?Social History  ? ?Occupational History  ? Not on file  ?Tobacco Use  ? Smoking status: Never  ? Smokeless tobacco: Never  ?Substance and Sexual Activity  ? Alcohol use: Yes  ?  Comment: occasionally  ? Drug use: No  ? Sexual activity: Not on file  ? ? ? ? ? ? ?

## 2022-02-25 ENCOUNTER — Encounter (HOSPITAL_COMMUNITY)
Admission: RE | Admit: 2022-02-25 | Discharge: 2022-02-25 | Disposition: A | Discharge status: Home / Self Care | Payer: 59 | Source: Ambulatory Visit | Attending: Gastroenterology | Admitting: Gastroenterology

## 2022-02-25 ENCOUNTER — Ambulatory Visit: Payer: 59 | Admitting: Family Medicine

## 2022-02-25 DIAGNOSIS — K50818 Crohn's disease of both small and large intestine with other complication: Secondary | ICD-10-CM | POA: Diagnosis not present

## 2022-02-25 MED ORDER — ACETAMINOPHEN 325 MG PO TABS
650.0000 mg | ORAL_TABLET | Freq: Once | ORAL | Status: AC
  Administered 2022-02-25: 650 mg via ORAL

## 2022-02-25 MED ORDER — SODIUM CHLORIDE 0.9 % IV SOLN
5.0000 mg/kg | INTRAVENOUS | Status: DC
  Administered 2022-02-25: 400 mg via INTRAVENOUS
  Filled 2022-02-25: qty 40

## 2022-02-25 MED ORDER — DIPHENHYDRAMINE HCL 25 MG PO CAPS
ORAL_CAPSULE | ORAL | Status: AC
Start: 2022-02-25 — End: 2022-02-25
  Filled 2022-02-25: qty 1

## 2022-02-25 MED ORDER — ACETAMINOPHEN 325 MG PO TABS
ORAL_TABLET | ORAL | Status: AC
  Filled 2022-02-25: qty 2

## 2022-02-25 MED ORDER — DIPHENHYDRAMINE HCL 25 MG PO CAPS
50.0000 mg | ORAL_CAPSULE | Freq: Once | ORAL | Status: AC
Start: 2022-02-25 — End: 2022-02-25
  Administered 2022-02-25: 50 mg via ORAL

## 2022-02-25 MED ORDER — DIPHENHYDRAMINE HCL 25 MG PO CAPS
ORAL_CAPSULE | ORAL | Status: AC
  Filled 2022-02-25: qty 1

## 2022-02-25 NOTE — Progress Notes (Signed)
Pt states that she is currently taking an antibiotic (Cefdinir) for a sinus infection. MD called and OK'd her Remicade for today.  ?

## 2022-03-04 ENCOUNTER — Ambulatory Visit: Payer: 59 | Admitting: Family Medicine

## 2022-03-04 ENCOUNTER — Encounter (HOSPITAL_COMMUNITY): Payer: Self-pay | Admitting: *Deleted

## 2022-03-04 ENCOUNTER — Encounter: Payer: Self-pay | Admitting: Family Medicine

## 2022-03-04 ENCOUNTER — Observation Stay (HOSPITAL_COMMUNITY)
Admission: EM | Admit: 2022-03-04 | Discharge: 2022-03-06 | Disposition: A | Discharge status: Home / Self Care | Payer: 59 | Attending: Internal Medicine | Admitting: Internal Medicine

## 2022-03-04 ENCOUNTER — Emergency Department (HOSPITAL_COMMUNITY): Payer: 59

## 2022-03-04 ENCOUNTER — Other Ambulatory Visit: Payer: Self-pay

## 2022-03-04 ENCOUNTER — Telehealth: Payer: Self-pay

## 2022-03-04 VITALS — BP 116/84 | HR 120 | Temp 97.6°F | Ht 66.0 in | Wt 149.0 lb

## 2022-03-04 DIAGNOSIS — F32A Depression, unspecified: Secondary | ICD-10-CM | POA: Diagnosis present

## 2022-03-04 DIAGNOSIS — Z79899 Other long term (current) drug therapy: Secondary | ICD-10-CM | POA: Diagnosis not present

## 2022-03-04 DIAGNOSIS — R0789 Other chest pain: Principal | ICD-10-CM | POA: Insufficient documentation

## 2022-03-04 DIAGNOSIS — R519 Headache, unspecified: Secondary | ICD-10-CM

## 2022-03-04 DIAGNOSIS — E119 Type 2 diabetes mellitus without complications: Secondary | ICD-10-CM

## 2022-03-04 DIAGNOSIS — F329 Major depressive disorder, single episode, unspecified: Secondary | ICD-10-CM | POA: Diagnosis not present

## 2022-03-04 DIAGNOSIS — R7989 Other specified abnormal findings of blood chemistry: Secondary | ICD-10-CM

## 2022-03-04 DIAGNOSIS — R197 Diarrhea, unspecified: Secondary | ICD-10-CM | POA: Diagnosis present

## 2022-03-04 DIAGNOSIS — J4521 Mild intermittent asthma with (acute) exacerbation: Secondary | ICD-10-CM | POA: Diagnosis not present

## 2022-03-04 DIAGNOSIS — R079 Chest pain, unspecified: Secondary | ICD-10-CM | POA: Diagnosis present

## 2022-03-04 DIAGNOSIS — Z20822 Contact with and (suspected) exposure to covid-19: Secondary | ICD-10-CM | POA: Diagnosis not present

## 2022-03-04 DIAGNOSIS — Z7984 Long term (current) use of oral hypoglycemic drugs: Secondary | ICD-10-CM | POA: Insufficient documentation

## 2022-03-04 DIAGNOSIS — I10 Essential (primary) hypertension: Secondary | ICD-10-CM | POA: Diagnosis not present

## 2022-03-04 DIAGNOSIS — R778 Other specified abnormalities of plasma proteins: Secondary | ICD-10-CM | POA: Insufficient documentation

## 2022-03-04 DIAGNOSIS — Z7982 Long term (current) use of aspirin: Secondary | ICD-10-CM | POA: Insufficient documentation

## 2022-03-04 DIAGNOSIS — R42 Dizziness and giddiness: Secondary | ICD-10-CM | POA: Diagnosis not present

## 2022-03-04 DIAGNOSIS — K5080 Crohn's disease of both small and large intestine without complications: Secondary | ICD-10-CM | POA: Diagnosis present

## 2022-03-04 DIAGNOSIS — I214 Non-ST elevation (NSTEMI) myocardial infarction: Secondary | ICD-10-CM | POA: Insufficient documentation

## 2022-03-04 DIAGNOSIS — R000 Tachycardia, unspecified: Secondary | ICD-10-CM | POA: Diagnosis present

## 2022-03-04 DIAGNOSIS — R946 Abnormal results of thyroid function studies: Secondary | ICD-10-CM | POA: Insufficient documentation

## 2022-03-04 DIAGNOSIS — E785 Hyperlipidemia, unspecified: Secondary | ICD-10-CM | POA: Diagnosis present

## 2022-03-04 DIAGNOSIS — M79602 Pain in left arm: Secondary | ICD-10-CM | POA: Diagnosis not present

## 2022-03-04 DIAGNOSIS — Z794 Long term (current) use of insulin: Secondary | ICD-10-CM | POA: Diagnosis not present

## 2022-03-04 DIAGNOSIS — E860 Dehydration: Secondary | ICD-10-CM | POA: Diagnosis present

## 2022-03-04 DIAGNOSIS — K5090 Crohn's disease, unspecified, without complications: Secondary | ICD-10-CM | POA: Diagnosis present

## 2022-03-04 DIAGNOSIS — E1165 Type 2 diabetes mellitus with hyperglycemia: Secondary | ICD-10-CM

## 2022-03-04 LAB — CBC
HCT: 42.2 % (ref 36.0–46.0)
Hemoglobin: 14.3 g/dL (ref 12.0–15.0)
MCH: 27.9 pg (ref 26.0–34.0)
MCHC: 33.9 g/dL (ref 30.0–36.0)
MCV: 82.3 fL (ref 80.0–100.0)
Platelets: 286 10*3/uL (ref 150–400)
RBC: 5.13 MIL/uL — ABNORMAL HIGH (ref 3.87–5.11)
RDW: 12.8 % (ref 11.5–15.5)
WBC: 6.8 10*3/uL (ref 4.0–10.5)
nRBC: 0 % (ref 0.0–0.2)

## 2022-03-04 LAB — BASIC METABOLIC PANEL
Anion gap: 8 (ref 5–15)
BUN: 8 mg/dL (ref 6–20)
CO2: 22 mmol/L (ref 22–32)
Calcium: 9.6 mg/dL (ref 8.9–10.3)
Chloride: 106 mmol/L (ref 98–111)
Creatinine, Ser: 0.63 mg/dL (ref 0.44–1.00)
GFR, Estimated: >60 mL/min (ref >60)
Glucose, Bld: 147 mg/dL — ABNORMAL HIGH (ref 70–99)
Potassium: 3.7 mmol/L (ref 3.5–5.1)
Sodium: 136 mmol/L (ref 135–145)

## 2022-03-04 LAB — CBC WITH DIFFERENTIAL/PLATELET
Basophils Absolute: 0 10*3/uL (ref 0.0–0.1)
Basophils Relative: 0.3 % (ref 0.0–3.0)
Eosinophils Absolute: 0.1 10*3/uL (ref 0.0–0.7)
Eosinophils Relative: 0.7 % (ref 0.0–5.0)
HCT: 43.4 % (ref 36.0–46.0)
Hemoglobin: 14.5 g/dL (ref 12.0–15.0)
Lymphocytes Relative: 45.9 % (ref 12.0–46.0)
Lymphs Abs: 3.4 10*3/uL (ref 0.7–4.0)
MCHC: 33.3 g/dL (ref 30.0–36.0)
MCV: 83 fl (ref 78.0–100.0)
Monocytes Absolute: 0.6 10*3/uL (ref 0.1–1.0)
Monocytes Relative: 7.8 % (ref 3.0–12.0)
Neutro Abs: 3.4 10*3/uL (ref 1.4–7.7)
Neutrophils Relative %: 45.3 % (ref 43.0–77.0)
Platelets: 288 10*3/uL (ref 150.0–400.0)
RBC: 5.23 Mil/uL — ABNORMAL HIGH (ref 3.87–5.11)
RDW: 12.9 % (ref 11.5–15.5)
WBC: 7.5 10*3/uL (ref 4.0–10.5)

## 2022-03-04 LAB — COMPREHENSIVE METABOLIC PANEL
ALT: 17 U/L (ref 0–35)
AST: 13 U/L (ref 0–37)
Albumin: 4.4 g/dL (ref 3.5–5.2)
Alkaline Phosphatase: 61 U/L (ref 39–117)
BUN: 10 mg/dL (ref 6–23)
CO2: 25 mEq/L (ref 19–32)
Calcium: 9.9 mg/dL (ref 8.4–10.5)
Chloride: 106 mEq/L (ref 96–112)
Creatinine, Ser: 0.55 mg/dL (ref 0.40–1.20)
GFR: 111.76 mL/min (ref >60.00)
Glucose, Bld: 59 mg/dL — ABNORMAL LOW (ref 70–99)
Potassium: 3.4 mEq/L — ABNORMAL LOW (ref 3.5–5.1)
Sodium: 140 mEq/L (ref 135–145)
Total Bilirubin: 1.1 mg/dL (ref 0.2–1.2)
Total Protein: 7.9 g/dL (ref 6.0–8.3)

## 2022-03-04 LAB — TROPONIN I (HIGH SENSITIVITY)
High Sens Troponin I: 152 ng/L (ref 2–17)
Troponin I (High Sensitivity): 133 ng/L (ref <18)
Troponin I (High Sensitivity): 95 ng/L — ABNORMAL HIGH (ref <18)

## 2022-03-04 LAB — TSH: TSH: 0.09 u[IU]/mL — ABNORMAL LOW (ref 0.35–5.50)

## 2022-03-04 LAB — T4, FREE: Free T4: 1 ng/dL (ref 0.60–1.60)

## 2022-03-04 NOTE — Telephone Encounter (Signed)
CRITICAL VALUE STICKER ? ?CRITICAL VALUE: Troponin 152 ? ?RECEIVER (on-site recipient of call): Melida Northington, CMA ? ?DATE & TIME NOTIFIED: 03/04/22 @ 4.27pm  ? ?MESSENGER (representative from lab): Hope ? ?MD NOTIFIED: Willaim Sheng, NP ? ?TIME OF NOTIFICATION: 4.27pm ? ?

## 2022-03-04 NOTE — Patient Instructions (Signed)
Take Tylenol for your headache and try to increase your fluids. ? ?You can take Imodium for diarrhea as long as you do not have a fever or blood in your stool. ? ?If you have any more chest pain or start having shortness of breath or severe left arm or jaw pain then call 911 or go to the emergency department. ? ?We will be in touch with your results ?

## 2022-03-04 NOTE — Assessment & Plan Note (Signed)
Discussed that she may be dehydrated due to the diarrhea which could be contributing to her headache.  She has not taken anything today for headache.  Recommend taking exercise Tylenol and increasing fluids. ?

## 2022-03-04 NOTE — Assessment & Plan Note (Signed)
Elbow ache. Does not appear to be related to chest pain. Tylenol may help.  ?

## 2022-03-04 NOTE — Assessment & Plan Note (Signed)
Acute onset of diarrhea this morning. Not her typical with Crohn's. No fever or blood. She may try Imodium. Increase fluids ?

## 2022-03-04 NOTE — Progress Notes (Signed)
? ?Subjective:  ? ? ? Patient ID: Cindy Long, female    DOB: 11-17-1975, 46 y.o.   MRN: [id number] ? ?Chief Complaint  ?Patient presents with  ? Migraine  ?  Started this morning and dizzy, HR at 130 and left arm is achy as well as chest discomfort. No appetite.  ? ?Hx of tachycardia    ? Nausea  ? Diarrhea  ? ? ?Migraine  ? ?Diarrhea  ? ?Patient is in today for headache, dizziness, diarrhea, chest pain, left arm pain, and tachycardia that all started this morning.  ?States her chest pain was left sided and started while sitting. The pain felt like pressure and lasted 2-4 minutes. Associated left arm ache but no shortness of breath. Currently denies chest pain. Left arm ache around the elbow.  ? ?Hx of palpitations. Has seen Dr. Oval Linsey, cardiologist, in the past few months.  ? ?Diarrhea is "yellow". Not typically an issue with her Crohn's disease.  ? ?Denies fever, chills, shortness of breath, abdominal pain, vomiting, urinary symptoms, LE edema.  ? ?No hx of DVT. No recent air travel or long car rides.  ? ? ?Coronary CT which ruled out cardiac causes for chest pain and minimal CAD, non-obstructive.  ? ?Recent URI and has been taking cefdiinir.  ? ?Hx of HTN, HL, CAD, DM, GERD and Crohn's disease.  ? ? ?Health Maintenance Due  ?Topic Date Due  ? FOOT EXAM  Never done  ? OPHTHALMOLOGY EXAM  Never done  ? Hepatitis C Screening  Never done  ? PAP SMEAR-Modifier  01/06/2014  ? HEMOGLOBIN A1C  01/13/2022  ? ? ?Past Medical History:  ?Diagnosis Date  ? Chest pressure 11/18/2021  ? Crohn disease ([place])   ? Diabetes mellitus   ? IDDM  ? Essential hypertension 11/18/2021  ? GERD (gastroesophageal reflux disease)   ? no meds currently  ? High cholesterol   ? Hypertension   ? Mastitis   ? right breast  ? Postpartum care following cesarean delivery (2/10) 12/17/2013  ? Pure hypercholesterolemia 11/18/2021  ? ? ?Past Surgical History:  ?Procedure Laterality Date  ? CERVICAL CERCLAGE    ? CERVICAL CERCLAGE N/A 06/21/2013  ?  Procedure: McDonald CERCLAGE CERVICAL;  Surgeon: Marvene Staff, MD;  Location: [facility];  Service: Gynecology;  Laterality: N/A;  ? CESAREAN SECTION    ? CESAREAN SECTION N/A 12/17/2013  ? Procedure: Repeat CESAREAN SECTION with Cerclage Removal;  Surgeon: Marvene Staff, MD;  Location: [facility];  Service: Obstetrics;  Laterality: N/A;  EDD: 12/22/13  ? CHOLECYSTECTOMY    ? CHOLECYSTECTOMY OPEN  08/2011  ? 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 ? ?Family History  ?Problem Relation Age of Onset  ? Hypertension Mother   ? Diabetes Maternal Aunt   ? Hypertension Maternal Grandmother   ? Cancer Maternal Grandmother   ?     breast, colon  ? Diabetes Maternal Grandmother   ? Hypertension Maternal Grandfather   ? Heart attack Cousin 42  ? ? ?Social History  ? ?Socioeconomic History  ? Marital status: Married  ?  Spouse name: Not on file  ? Number of children: Not on file  ? Years of education: Not on file  ? Highest education level: Not on file  ?Occupational History  ? Not on file  ?Tobacco Use  ? Smoking status: Never  ? Smokeless tobacco: Never  ?Substance and Sexual Activity  ? Alcohol use: Yes  ?  Comment: occasionally  ?  Drug use: No  ? Sexual activity: Not on file  ?Other Topics Concern  ? Not on file  ?Social History Narrative  ? Not on file  ? ?Social Determinants of Health  ? ?Financial Resource Strain: Low Risk   ? Difficulty of Paying Living Expenses: Not hard at all  ?Food Insecurity: No Food Insecurity  ? Worried About Charity fundraiser in the Last Year: Never true  ? Ran Out of Food in the Last Year: Never true  ?Transportation Needs: No Transportation Needs  ? Lack of Transportation (Medical): No  ? Lack of Transportation (Non-Medical): No  ?Physical Activity: Inactive  ? Days of Exercise per Week: 0 days  ? Minutes of Exercise per Session: 0 min  ?Stress: Not on file  ?Social Connections: Not on file  ?Intimate Partner Violence: Not on file  ? ? ?Outpatient Medications Prior to Visit   ?Medication Sig Dispense Refill  ? albuterol (PROVENTIL HFA;VENTOLIN HFA) 108 (90 Base) MCG/ACT inhaler Inhale 1-2 puffs into the lungs every 6 (six) hours as needed for wheezing or shortness of breath.    ? aspirin EC 81 MG tablet Take 1 tablet (81 mg total) by mouth daily. Swallow whole. 90 tablet 3  ? atorvastatin (LIPITOR) 20 MG tablet Take 1 tablet by mouth daily. 90 tablet 3  ? Bacillus Coagulans-Inulin (PROBIOTIC) 1-250 BILLION-MG CAPS Take 1 capsule by mouth daily.    ? cefdinir (OMNICEF) 300 MG capsule Take 1 capsule (300 mg total) by mouth 2 (two) times daily. 20 capsule 0  ? Continuous Blood Gluc Sensor (DEXCOM G6 SENSOR) MISC Change sensor every 10 days 9 each 4  ? Continuous Blood Gluc Transmit (DEXCOM G6 TRANSMITTER) MISC Use as directed for 90 days 1 each 4  ? cyanocobalamin (,VITAMIN B-12,) 1000 MCG/ML injection Inject 1 ml (1000 mcg) subcutaneously daily for 1 week, then weekly for 1 month, then once every 2 weeks 10 mL 6  ? dapagliflozin propanediol (FARXIGA) 10 MG TABS tablet Take 1 tablet by mouth once a day 30 tablet 5  ? escitalopram (LEXAPRO) 20 MG tablet TAKE 2 TABLETS BY MOUTH ONCE A DAY. 180 tablet 3  ? HYDROcodone bit-homatropine (HYCODAN) 5-1.5 MG/5ML syrup Take 5 mLs by mouth every 8 (eight) hours as needed for cough. 240 mL 0  ? hyoscyamine (LEVBID) 0.375 MG 12 hr tablet Take 1 tablet  by mouth as needed as directed (30 days). 30 tablet 1  ? inFLIXimab (REMICADE) 100 MG injection every 8 (eight) weeks.    ? Insulin Pen Needle 32G X 4 MM MISC by Does not apply route. BD U/F Pen Needles Nano 4 mm x 32 G; use with insulin    ? insulin regular human CONCENTRATED (HUMULIN R U-500 KWIKPEN) 500 UNIT/ML kwikpen Inject 90 units under the skin at lunch and 40 units at evening meal 24 mL 3  ? LORazepam (ATIVAN) 1 MG tablet Take 1/2 tablet by mouth once daily as needed 20 tablet 1  ? losartan (COZAAR) 50 MG tablet Take 50 mg by mouth daily.    ? medroxyPROGESTERone (DEPO-PROVERA) 150 MG/ML  injection Inject 150 mg into the muscle every 3 (three) months.    ? medroxyPROGESTERone Acetate (DEPO-PROVERA) 150 MG/ML SUSY Inject 1 mL into the muscle every 3 months 1 mL 4  ? MELATONIN PO Take 20 capsules by mouth daily.    ? neomycin-polymyxin b-dexamethasone (MAXITROL) 3.5-10000-0.1 SUSP SMARTSIG:In Eye(s)    ? nystatin-triamcinolone (MYCOLOG II) cream APPLY TO THE AFFECTED AREA(S)  TWO TIMES DAILY AS DIRECTED 20 g 2  ? pantoprazole (PROTONIX) 40 MG tablet Take 1 tablet (40 mg total) by mouth 2 (two) times daily. 60 tablet 1  ? promethazine (PHENERGAN) 25 MG tablet TAKE 1 TABLET BY MOUTH EVERY 8 HOURS 90 tablet 1  ? repaglinide (PRANDIN) 1 MG tablet Take 1 tablet (1 mg total) by mouth 3 (three) times daily before meals. 270 tablet 1  ? Semaglutide,0.25 or 0.5MG/DOS, (OZEMPIC, 0.25 OR 0.5 MG/DOSE,) 2 MG/3ML SOPN Inject 0.25 mg into the skin once a week for four weeks, then 0.5 mg once a week 9 mL 4  ? spironolactone (ALDACTONE) 50 MG tablet Take 1 tablet by mouth every day. 30 tablet 11  ? SYRINGE-NEEDLE, DISP, 3 ML 25G X 5/8" 3 ML MISC Use to inject B12 under the skin 50 each 3  ? Vitamin D, Ergocalciferol, (DRISDOL) 1.25 MG (50000 UNIT) CAPS capsule Take 1 capsule (50,000 Units total) by mouth every 7 (seven) days. 8 capsule 0  ? ?No facility-administered medications prior to visit.  ? ? ?Allergies  ?Allergen Reactions  ? Metronidazole Swelling  ?  Throat swelling  ? Shellfish Allergy Swelling  ?  Swelling of the throat  ? Empagliflozin-Linagliptin Other (See Comments)  ? Lisinopril Other (See Comments)  ? Metformin Hcl Other (See Comments)  ? Prednisone Other (See Comments)  ? Dulaglutide Rash  ? Liraglutide Rash  ? ? ?Review of Systems  ?Gastrointestinal:  Positive for diarrhea.  ?Pertinent positives and negatives in the history of present illness. ? ?   ?Objective:  ?  ?Physical Exam ?Constitutional:   ?   General: She is not in acute distress. ?   Appearance: She is ill-appearing.  ?Eyes:  ?   Extraocular  Movements: Extraocular movements intact.  ?   Conjunctiva/sclera: Conjunctivae normal.  ?   Pupils: Pupils are equal, round, and reactive to light.  ?Cardiovascular:  ?   Rate and Rhythm: Regular rhythm. Nils Pyle

## 2022-03-04 NOTE — ED Provider Triage Note (Signed)
Emergency Medicine Provider Triage Evaluation Note ? ?Cindy Long , a 46 y.o. female  was evaluated in triage.  Pt complains of chest pain.  Patient states that when she was at work today and sitting, she noticed that she had palpitations and associated chest pain.  The pain radiated down her left arm. It felt like a dull pressure.  Symptoms lasted for about 30 minutes.  She continued to have the palpitations and the left arm pain.  She was seen by her primary care today and discussed the symptoms.  They did a troponin on her which returned elevated to 150.  She was then sent to the emergency department.  Patient says she does have a history of SVT and is felt like her heart has been racing today.  He is not on any rate controlling medications she denies any history of cardiac disease. ? ?Review of Systems  ?Positive: Chest pain, palpitations ?Negative:  ? ?Physical Exam  ?BP (!) 152/91 (BP Location: Right Arm)   Pulse (!) 121   Temp 98.9 ?F (37.2 ?C) (Oral)   Resp 16   Ht 5' 6"  (1.676 m)   Wt 67.6 kg   SpO2 99%   BMI 24.05 kg/m?  ?Gen:   Awake, no distress   ?Resp:  Normal effort  ?MSK:   Moves extremities without difficulty  ?Other:   ? ?Medical Decision Making  ?Medically screening exam initiated at 5:52 PM.  Appropriate orders placed.  PENNYE BEEGHLY was informed that the remainder of the evaluation will be completed by another provider, this initial triage assessment does not replace that evaluation, and the importance of remaining in the ED until their evaluation is complete. ? ? ?  ?Adolphus Birchwood, PA-C ?03/04/22 1753 ? ?

## 2022-03-04 NOTE — Assessment & Plan Note (Signed)
2-4 minute history of chest pain while sitting at her desk. Pain has not returned. Check labs including troponin. She is aware that if she develops pain again that she will go to the ED for evaluation.  ?No sign of DVT ?

## 2022-03-04 NOTE — Telephone Encounter (Signed)
Pt has been informed and is in route to the ED.  ?

## 2022-03-04 NOTE — ED Triage Notes (Signed)
The pt just came from a cards office  she was seen for chest pain and dizziness and her tro was checked  it was 150  sent here to be seen lmpnone on depo ?

## 2022-03-04 NOTE — Assessment & Plan Note (Signed)
Discussed that this may be related to diarrhea and limited intake today.  Encouraged her to increase fluids.  EKG shows sinus tachycardia, rate 115,  unchanged from previous one in January 2023.  No acute changes ?

## 2022-03-05 ENCOUNTER — Emergency Department (HOSPITAL_COMMUNITY): Payer: 59

## 2022-03-05 ENCOUNTER — Observation Stay (HOSPITAL_BASED_OUTPATIENT_CLINIC_OR_DEPARTMENT_OTHER): Payer: 59

## 2022-03-05 ENCOUNTER — Encounter (HOSPITAL_COMMUNITY): Payer: Self-pay | Admitting: Internal Medicine

## 2022-03-05 DIAGNOSIS — R7989 Other specified abnormal findings of blood chemistry: Secondary | ICD-10-CM

## 2022-03-05 DIAGNOSIS — R079 Chest pain, unspecified: Secondary | ICD-10-CM

## 2022-03-05 DIAGNOSIS — R42 Dizziness and giddiness: Secondary | ICD-10-CM | POA: Diagnosis not present

## 2022-03-05 DIAGNOSIS — R0789 Other chest pain: Secondary | ICD-10-CM | POA: Diagnosis not present

## 2022-03-05 DIAGNOSIS — R000 Tachycardia, unspecified: Secondary | ICD-10-CM

## 2022-03-05 DIAGNOSIS — I10 Essential (primary) hypertension: Secondary | ICD-10-CM

## 2022-03-05 DIAGNOSIS — R778 Other specified abnormalities of plasma proteins: Secondary | ICD-10-CM

## 2022-03-05 DIAGNOSIS — Z20822 Contact with and (suspected) exposure to covid-19: Secondary | ICD-10-CM | POA: Diagnosis not present

## 2022-03-05 DIAGNOSIS — E860 Dehydration: Secondary | ICD-10-CM | POA: Diagnosis not present

## 2022-03-05 DIAGNOSIS — Z794 Long term (current) use of insulin: Secondary | ICD-10-CM

## 2022-03-05 DIAGNOSIS — R197 Diarrhea, unspecified: Secondary | ICD-10-CM

## 2022-03-05 DIAGNOSIS — E11618 Type 2 diabetes mellitus with other diabetic arthropathy: Secondary | ICD-10-CM

## 2022-03-05 DIAGNOSIS — Z7984 Long term (current) use of oral hypoglycemic drugs: Secondary | ICD-10-CM | POA: Diagnosis not present

## 2022-03-05 DIAGNOSIS — E785 Hyperlipidemia, unspecified: Secondary | ICD-10-CM

## 2022-03-05 DIAGNOSIS — E119 Type 2 diabetes mellitus without complications: Secondary | ICD-10-CM | POA: Diagnosis not present

## 2022-03-05 DIAGNOSIS — Z79899 Other long term (current) drug therapy: Secondary | ICD-10-CM | POA: Diagnosis not present

## 2022-03-05 DIAGNOSIS — F329 Major depressive disorder, single episode, unspecified: Secondary | ICD-10-CM | POA: Diagnosis not present

## 2022-03-05 DIAGNOSIS — I214 Non-ST elevation (NSTEMI) myocardial infarction: Secondary | ICD-10-CM

## 2022-03-05 DIAGNOSIS — Z7982 Long term (current) use of aspirin: Secondary | ICD-10-CM | POA: Diagnosis not present

## 2022-03-05 DIAGNOSIS — K50819 Crohn's disease of both small and large intestine with unspecified complications: Secondary | ICD-10-CM | POA: Diagnosis not present

## 2022-03-05 LAB — RESP PANEL BY RT-PCR (FLU A&B, COVID) ARPGX2
Influenza A by PCR: NEGATIVE
Influenza B by PCR: NEGATIVE
SARS Coronavirus 2 by RT PCR: NEGATIVE

## 2022-03-05 LAB — COMPREHENSIVE METABOLIC PANEL
ALT: 22 U/L (ref 0–44)
AST: 20 U/L (ref 15–41)
Albumin: 3.5 g/dL (ref 3.5–5.0)
Alkaline Phosphatase: 50 U/L (ref 38–126)
Anion gap: 7 (ref 5–15)
BUN: 6 mg/dL (ref 6–20)
CO2: 21 mmol/L — ABNORMAL LOW (ref 22–32)
Calcium: 9.3 mg/dL (ref 8.9–10.3)
Chloride: 110 mmol/L (ref 98–111)
Creatinine, Ser: 0.61 mg/dL (ref 0.44–1.00)
GFR, Estimated: >60 mL/min (ref >60)
Glucose, Bld: 255 mg/dL — ABNORMAL HIGH (ref 70–99)
Potassium: 3.9 mmol/L (ref 3.5–5.1)
Sodium: 138 mmol/L (ref 135–145)
Total Bilirubin: 1.2 mg/dL (ref 0.3–1.2)
Total Protein: 7 g/dL (ref 6.5–8.1)

## 2022-03-05 LAB — I-STAT BETA HCG BLOOD, ED (MC, WL, AP ONLY): I-stat hCG, quantitative: <5 m[IU]/mL (ref <5)

## 2022-03-05 LAB — LIPID PANEL
Cholesterol: 116 mg/dL (ref 0–200)
HDL: 34 mg/dL — ABNORMAL LOW (ref >40)
LDL Cholesterol: 68 mg/dL (ref 0–99)
Total CHOL/HDL Ratio: 3.4 RATIO
Triglycerides: 71 mg/dL (ref <150)
VLDL: 14 mg/dL (ref 0–40)

## 2022-03-05 LAB — CBC WITH DIFFERENTIAL/PLATELET
Abs Immature Granulocytes: 0.02 10*3/uL (ref 0.00–0.07)
Basophils Absolute: 0 10*3/uL (ref 0.0–0.1)
Basophils Relative: 0 %
Eosinophils Absolute: 0.1 10*3/uL (ref 0.0–0.5)
Eosinophils Relative: 1 %
HCT: 44.1 % (ref 36.0–46.0)
Hemoglobin: 14.4 g/dL (ref 12.0–15.0)
Immature Granulocytes: 0 %
Lymphocytes Relative: 40 %
Lymphs Abs: 2.6 10*3/uL (ref 0.7–4.0)
MCH: 27.1 pg (ref 26.0–34.0)
MCHC: 32.7 g/dL (ref 30.0–36.0)
MCV: 83.1 fL (ref 80.0–100.0)
Monocytes Absolute: 0.4 10*3/uL (ref 0.1–1.0)
Monocytes Relative: 6 %
Neutro Abs: 3.4 10*3/uL (ref 1.7–7.7)
Neutrophils Relative %: 53 %
Platelets: 266 10*3/uL (ref 150–400)
RBC: 5.31 MIL/uL — ABNORMAL HIGH (ref 3.87–5.11)
RDW: 12.9 % (ref 11.5–15.5)
WBC: 6.4 10*3/uL (ref 4.0–10.5)
nRBC: 0 % (ref 0.0–0.2)

## 2022-03-05 LAB — URINALYSIS, COMPLETE (UACMP) WITH MICROSCOPIC
Bacteria, UA: NONE SEEN
Bilirubin Urine: NEGATIVE
Glucose, UA: >=500 mg/dL — AB
Hgb urine dipstick: NEGATIVE
Ketones, ur: NEGATIVE mg/dL
Leukocytes,Ua: NEGATIVE
Nitrite: NEGATIVE
Protein, ur: NEGATIVE mg/dL
Specific Gravity, Urine: 1.02 (ref 1.005–1.030)
pH: 6 (ref 5.0–8.0)

## 2022-03-05 LAB — TSH: TSH: 0.048 u[IU]/mL — ABNORMAL LOW (ref 0.350–4.500)

## 2022-03-05 LAB — ECHOCARDIOGRAM COMPLETE
AR max vel: 2.63 cm2
AV Area VTI: 2.94 cm2
AV Area mean vel: 2.72 cm2
AV Mean grad: 2 mmHg
AV Peak grad: 4.1 mmHg
Ao pk vel: 1.01 m/s
Area-P 1/2: 4.49 cm2
Height: 66 in
S' Lateral: 2.6 cm
Weight: 2473.6 oz

## 2022-03-05 LAB — HEMOGLOBIN A1C
Hgb A1c MFr Bld: 10.7 % — ABNORMAL HIGH (ref 4.8–5.6)
Mean Plasma Glucose: 260.39 mg/dL

## 2022-03-05 LAB — CBG MONITORING, ED: Glucose-Capillary: 312 mg/dL — ABNORMAL HIGH (ref 70–99)

## 2022-03-05 LAB — GLUCOSE, CAPILLARY
Glucose-Capillary: 218 mg/dL — ABNORMAL HIGH (ref 70–99)
Glucose-Capillary: 273 mg/dL — ABNORMAL HIGH (ref 70–99)

## 2022-03-05 LAB — TROPONIN I (HIGH SENSITIVITY)
Troponin I (High Sensitivity): 47 ng/L — ABNORMAL HIGH (ref <18)
Troponin I (High Sensitivity): 70 ng/L — ABNORMAL HIGH (ref <18)

## 2022-03-05 LAB — MAGNESIUM: Magnesium: 1.8 mg/dL (ref 1.7–2.4)

## 2022-03-05 LAB — HIV ANTIBODY (ROUTINE TESTING W REFLEX): HIV Screen 4th Generation wRfx: NONREACTIVE

## 2022-03-05 MED ORDER — POLYETHYLENE GLYCOL 3350 17 G PO PACK: 17.0000 g | PACK | Freq: Every day | ORAL | Status: DC | PRN

## 2022-03-05 MED ORDER — ENOXAPARIN SODIUM 40 MG/0.4ML IJ SOSY
40.0000 mg | PREFILLED_SYRINGE | Freq: Every day | INTRAMUSCULAR | Status: DC
  Administered 2022-03-05 – 2022-03-06 (×2): 40 mg via SUBCUTANEOUS
  Filled 2022-03-05 (×2): qty 0.4

## 2022-03-05 MED ORDER — METOPROLOL TARTRATE 5 MG/5ML IV SOLN: 5.0000 mg | Freq: Four times a day (QID) | INTRAVENOUS | Status: DC | PRN

## 2022-03-05 MED ORDER — ONDANSETRON HCL 4 MG PO TABS
4.0000 mg | ORAL_TABLET | Freq: Four times a day (QID) | ORAL | Status: DC | PRN
Start: 2022-03-05 — End: 2022-03-06

## 2022-03-05 MED ORDER — ONDANSETRON HCL 4 MG/2ML IJ SOLN: 4.0000 mg | Freq: Four times a day (QID) | INTRAMUSCULAR | Status: DC | PRN

## 2022-03-05 MED ORDER — INSULIN ASPART 100 UNIT/ML IJ SOLN
0.0000 [IU] | Freq: Three times a day (TID) | INTRAMUSCULAR | Status: DC
  Administered 2022-03-05: 11 [IU] via SUBCUTANEOUS
  Administered 2022-03-05 – 2022-03-06 (×3): 5 [IU] via SUBCUTANEOUS

## 2022-03-05 MED ORDER — ATORVASTATIN CALCIUM 10 MG PO TABS
20.0000 mg | ORAL_TABLET | Freq: Every day | ORAL | Status: DC
  Administered 2022-03-05 – 2022-03-06 (×2): 20 mg via ORAL
  Filled 2022-03-05 (×2): qty 2

## 2022-03-05 MED ORDER — RISAQUAD PO CAPS
1.0000 | ORAL_CAPSULE | Freq: Every day | ORAL | Status: DC
  Administered 2022-03-05 – 2022-03-06 (×2): 1 via ORAL
  Filled 2022-03-05 (×2): qty 1

## 2022-03-05 MED ORDER — ASPIRIN EC 81 MG PO TBEC
81.0000 mg | DELAYED_RELEASE_TABLET | Freq: Every day | ORAL | Status: DC
  Administered 2022-03-05 – 2022-03-06 (×2): 81 mg via ORAL
  Filled 2022-03-05 (×2): qty 1

## 2022-03-05 MED ORDER — LORAZEPAM 0.5 MG PO TABS: 0.5000 mg | ORAL_TABLET | Freq: Every day | ORAL | Status: DC | PRN

## 2022-03-05 MED ORDER — ALBUTEROL SULFATE (2.5 MG/3ML) 0.083% IN NEBU: 3.0000 mL | INHALATION_SOLUTION | Freq: Four times a day (QID) | RESPIRATORY_TRACT | Status: DC | PRN

## 2022-03-05 MED ORDER — PROBIOTIC 1-250 BILLION-MG PO CAPS: 1.0000 | ORAL_CAPSULE | Freq: Every day | ORAL | Status: DC

## 2022-03-05 MED ORDER — SODIUM CHLORIDE 0.9 % IV BOLUS
1000.0000 mL | Freq: Once | INTRAVENOUS | Status: AC
  Administered 2022-03-05: 1000 mL via INTRAVENOUS

## 2022-03-05 MED ORDER — LOPERAMIDE HCL 2 MG PO CAPS
4.0000 mg | ORAL_CAPSULE | ORAL | Status: DC | PRN
  Administered 2022-03-05: 4 mg via ORAL
  Filled 2022-03-05: qty 2

## 2022-03-05 MED ORDER — SODIUM CHLORIDE 0.9% FLUSH
3.0000 mL | Freq: Two times a day (BID) | INTRAVENOUS | Status: DC
  Administered 2022-03-05 – 2022-03-06 (×2): 3 mL via INTRAVENOUS

## 2022-03-05 MED ORDER — IOHEXOL 350 MG/ML SOLN
75.0000 mL | Freq: Once | INTRAVENOUS | Status: AC | PRN
  Administered 2022-03-05: 75 mL via INTRAVENOUS

## 2022-03-05 MED ORDER — KETOROLAC TROMETHAMINE 30 MG/ML IJ SOLN
30.0000 mg | Freq: Four times a day (QID) | INTRAMUSCULAR | Status: DC | PRN
  Filled 2022-03-05: qty 1

## 2022-03-05 MED ORDER — PANTOPRAZOLE SODIUM 40 MG PO TBEC
40.0000 mg | DELAYED_RELEASE_TABLET | Freq: Two times a day (BID) | ORAL | Status: DC
  Administered 2022-03-05 – 2022-03-06 (×3): 40 mg via ORAL
  Filled 2022-03-05 (×3): qty 1

## 2022-03-05 MED ORDER — HYOSCYAMINE SULFATE ER 0.375 MG PO TB12
0.3750 mg | ORAL_TABLET | Freq: Once | ORAL | Status: AC
  Administered 2022-03-05: 0.375 mg via ORAL
  Filled 2022-03-05: qty 1

## 2022-03-05 MED ORDER — MAGNESIUM SULFATE 2 GM/50ML IV SOLN
2.0000 g | Freq: Once | INTRAVENOUS | Status: AC
  Administered 2022-03-05: 2 g via INTRAVENOUS
  Filled 2022-03-05: qty 50

## 2022-03-05 MED ORDER — OXYCODONE HCL 5 MG PO TABS: 5.0000 mg | ORAL_TABLET | ORAL | Status: DC | PRN

## 2022-03-05 MED ORDER — HYOSCYAMINE SULFATE ER 0.375 MG PO TB12
0.3750 mg | ORAL_TABLET | Freq: Every day | ORAL | Status: DC | PRN
  Filled 2022-03-05: qty 1

## 2022-03-05 MED ORDER — ESCITALOPRAM OXALATE 10 MG PO TABS
40.0000 mg | ORAL_TABLET | Freq: Every day | ORAL | Status: DC
  Administered 2022-03-05 – 2022-03-06 (×2): 40 mg via ORAL
  Filled 2022-03-05 (×2): qty 4

## 2022-03-05 MED ORDER — INSULIN GLARGINE-YFGN 100 UNIT/ML ~~LOC~~ SOLN
10.0000 [IU] | Freq: Every day | SUBCUTANEOUS | Status: DC
  Administered 2022-03-05 – 2022-03-06 (×2): 10 [IU] via SUBCUTANEOUS
  Filled 2022-03-05 (×3): qty 0.1

## 2022-03-05 MED ORDER — SORBITOL 70 % SOLN
30.0000 mL | Freq: Every day | Status: DC | PRN
  Filled 2022-03-05: qty 30

## 2022-03-05 MED ORDER — SODIUM CHLORIDE 0.9 % IV SOLN: INTRAVENOUS | Status: DC

## 2022-03-05 MED ORDER — ACETAMINOPHEN 325 MG PO TABS
650.0000 mg | ORAL_TABLET | Freq: Four times a day (QID) | ORAL | Status: DC | PRN
  Administered 2022-03-05: 650 mg via ORAL
  Filled 2022-03-05: qty 2

## 2022-03-05 MED ORDER — HYDROCODONE BIT-HOMATROP MBR 5-1.5 MG/5ML PO SOLN: 5.0000 mL | Freq: Three times a day (TID) | ORAL | Status: DC | PRN

## 2022-03-05 MED ORDER — LEVALBUTEROL HCL 0.63 MG/3ML IN NEBU: 0.6300 mg | INHALATION_SOLUTION | RESPIRATORY_TRACT | Status: DC | PRN

## 2022-03-05 MED ORDER — ACETAMINOPHEN 650 MG RE SUPP
650.0000 mg | Freq: Four times a day (QID) | RECTAL | Status: DC | PRN
Start: 2022-03-05 — End: 2022-03-06

## 2022-03-05 NOTE — ED Provider Notes (Signed)
?  Provider Note ?MRN:  [id number]  ?Arrival date & time: 03/05/22    ?ED Course and Medical Decision Making  ?Assumed care from [place] at shift change. ? ?See not from prior team for complete details, in brief:  ?46 year old female history of PE to the ED with chest pain, dyspnea, nausea and vomiting which is since resolved. ? ?Patient saw PCP, they obtained troponin which was elevated. ? ?In the ED troponin downtrending, 130s > 95 ? ?Labs o/w stable ? ?CT PE was negative. ? ?PA discussed with cardiology, recommends to trend troponin and 2D echo. ? ?Care handoff to me pending admission ? ?I spoke with hospitalist who accepts patient for admission for echo, further cardiology eval. ? ? ?.Critical Care ?Performed by: Jeanell Sparrow, DO ?Authorized by: Jeanell Sparrow, DO  ? ?Critical care provider statement:  ?  Critical care time (minutes):  30 ?  Critical care time was exclusive of:  Separately billable procedures and treating other patients ?  Critical care was necessary to treat or prevent imminent or life-threatening deterioration of the following conditions:  Cardiac failure ?  Critical care was time spent personally by me on the following activities:  Development of treatment plan with patient or surrogate, discussions with consultants, evaluation of patient's response to treatment, examination of patient, ordering and review of laboratory studies, ordering and review of radiographic studies, ordering and performing treatments and interventions, pulse oximetry, re-evaluation of patient's condition, review of old charts and obtaining history from patient or surrogate ?  Care discussed with: admitting provider   ? ?Final Clinical Impressions(s) / ED Diagnoses  ? ?  ICD-10-CM   ?1. Atypical chest pain  R07.89   ?  ?2. Elevated troponin  R77.8   ?  ?  ?ED Discharge Orders   ? ? None  ? ?  ?  ?Discharge Instructions   ?None ?  ? ? ? ?  ?Jeanell Sparrow, DO ?03/05/22 0757 ? ?

## 2022-03-05 NOTE — ED Notes (Signed)
Breakfast tray ordered 

## 2022-03-05 NOTE — Consult Note (Signed)
?Cardiology Consultation:  ? ?Patient ID: Cindy Long ?MRN: [id number]; DOB: 1976/01/11 ? ?Admit date: 03/04/2022 ?Date of Consult: 03/05/2022 ? ?Primary Care Provider: Cassandria Anger, MD ?Primary Cardiologist: Skeet Latch, MD  ?Primary Electrophysiologist:  None  ? ? ?Patient Profile:  ? ?Cindy Long is a 46 y.o. female with a hx of chron's dz, type 2 dm, htn who is being seen today for the evaluation of chest pain at the request of emergency department. ? ?History of Present Illness:  ? ?Cindy Long is a 46 year old female with a history of Crohn's disease, type 2 diabetes mellitus, hypertension initially presented to primary care with complaints of diarrhea, headaches, tachycardia, and mild nonexertional chest pain.  She notes that she had an asthma flare about 2 weeks ago and since then has been having some coughing but no chest discomfort.  Then over the last few days and particularly worse the last 24 hours she has had a Crohn's flare with a significant amount of diarrhea.  In that time she has also developed some left-sided dull aching chest discomfort that is nonexertional.  Other symptoms include palpitations, tachycardia, headaches.  No shortness of breath. ? ?Of note, she had some chest discomfort in November 2022 and was sent for a coronary CTA that was completed in January 2023 with nonobstructive coronary disease. ? ?Given the symptoms, her primary care thought she was dehydrated from her Crohn's but given the chest pain also ordered a troponin which was elevated.  Thus she called and had the patient return to the emergency department for further evaluation.  I saw her she is currently chest pain-free.  Cardiology called for evaluation because of the troponin change from 133-95. ? ?Past Medical History:  ?Diagnosis Date  ? Chest pressure 11/18/2021  ? Crohn disease ([place])   ? Diabetes mellitus   ? IDDM  ? Essential hypertension 11/18/2021  ? GERD (gastroesophageal reflux disease)   ?  no meds currently  ? High cholesterol   ? Hypertension   ? Mastitis   ? right breast  ? Postpartum care following cesarean delivery (2/10) 12/17/2013  ? Pure hypercholesterolemia 11/18/2021  ? ? ?Past Surgical History:  ?Procedure Laterality Date  ? CERVICAL CERCLAGE    ? CERVICAL CERCLAGE N/A 06/21/2013  ? Procedure: McDonald CERCLAGE CERVICAL;  Surgeon: Marvene Staff, MD;  Location: [facility];  Service: Gynecology;  Laterality: N/A;  ? CESAREAN SECTION    ? CESAREAN SECTION N/A 12/17/2013  ? Procedure: Repeat CESAREAN SECTION with Cerclage Removal;  Surgeon: Marvene Staff, MD;  Location: [facility];  Service: Obstetrics;  Laterality: N/A;  EDD: 12/22/13  ? CHOLECYSTECTOMY    ? CHOLECYSTECTOMY OPEN  08/2011  ? 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  ? ?Home Medications:  ?Prior to Admission medications   ?Medication Sig Start Date End Date Taking? Authorizing Provider  ?acetaminophen (TYLENOL) 500 MG tablet Take 1,000 mg by mouth every 6 (six) hours as needed for moderate pain or headache.   Yes [provider]  ?albuterol (PROVENTIL HFA;VENTOLIN HFA) 108 (90 Base) MCG/ACT inhaler Inhale 1-2 puffs into the lungs every 6 (six) hours as needed for wheezing or shortness of breath.   Yes [provider]  ?aspirin EC 81 MG tablet Take 1 tablet (81 mg total) by mouth daily. Swallow whole. 01/17/22  Yes Marylu Lund., NP  ?atorvastatin (LIPITOR) 20 MG tablet Take 1 tablet by mouth daily. 01/17/22 01/12/23 Yes Marylu Lund., NP  ?Bacillus  Coagulans-Inulin (PROBIOTIC) 1-250 BILLION-MG CAPS Take 1 capsule by mouth daily.   Yes [provider]  ?Continuous Blood Gluc Sensor (DEXCOM G6 SENSOR) MISC Change sensor every 10 days 01/17/22  Yes   ?Continuous Blood Gluc Transmit (DEXCOM G6 TRANSMITTER) MISC Use as directed for 90 days 01/17/22  Yes   ?cyanocobalamin (,VITAMIN B-12,) 1000 MCG/ML injection Inject 1 ml (1000 mcg) subcutaneously daily for 1 week, then weekly for 1 month, then  once every 2 weeks ?Patient taking differently: Inject 1,000 mcg into the skin every 14 (fourteen) days. 12/01/21  Yes Plotnikov, Evie Lacks, MD  ?escitalopram (LEXAPRO) 20 MG tablet TAKE 2 TABLETS BY MOUTH ONCE A DAY. 11/25/21  Yes   ?HYDROcodone bit-homatropine (HYCODAN) 5-1.5 MG/5ML syrup Take 5 mLs by mouth every 8 (eight) hours as needed for cough. 02/23/22  Yes Plotnikov, Evie Lacks, MD  ?hyoscyamine (LEVBID) 0.375 MG 12 hr tablet Take 1 tablet  by mouth as needed as directed (30 days). ?Patient taking differently: Take 0.375 mg by mouth daily as needed for cramping. 11/25/21  Yes Ronnette Juniper, MD  ?ibuprofen (ADVIL) 200 MG tablet Take 800 mg by mouth every 6 (six) hours as needed for moderate pain or headache.   Yes [provider]  ?inFLIXimab (REMICADE) 100 MG injection every 8 (eight) weeks.   Yes [provider]  ?Insulin Pen Needle 32G X 4 MM MISC by Does not apply route. BD U/F Pen Needles Nano 4 mm x 32 G; use with insulin   Yes [provider]  ?insulin regular human CONCENTRATED (HUMULIN R U-500 KWIKPEN) 500 UNIT/ML kwikpen Inject 90 units under the skin at lunch and 40 units at evening meal ?Patient taking differently: Inject 10-90 Units into the skin See admin instructions. 10 in the morning ?90 at lunch ?50 at dinner 05/26/21  Yes   ?LORazepam (ATIVAN) 1 MG tablet Take 1/2 tablet by mouth once daily as needed ?Patient taking differently: Take 0.5 mg by mouth daily as needed for anxiety. 11/25/21  Yes   ?losartan (COZAAR) 50 MG tablet Take 50 mg by mouth daily. 10/18/19  Yes [provider]  ?medroxyPROGESTERone Acetate (DEPO-PROVERA) 150 MG/ML SUSY Inject 1 mL into the muscle every 3 months 01/06/22  Yes   ?MELATONIN PO Take 20 mg by mouth at bedtime as needed (sleep).   Yes [provider]  ?nystatin-triamcinolone (MYCOLOG II) cream APPLY TO THE AFFECTED AREA(S) TWO TIMES DAILY AS DIRECTED ?Patient taking differently: Apply 1 application. topically 2 (two) times  daily as needed (rash). 09/25/21  Yes   ?promethazine (PHENERGAN) 25 MG tablet TAKE 1 TABLET BY MOUTH EVERY 8 HOURS ?Patient taking differently: Take 25 mg by mouth every 8 (eight) hours as needed for vomiting or nausea. 11/25/21  Yes   ?repaglinide (PRANDIN) 1 MG tablet Take 1 tablet (1 mg total) by mouth 3 (three) times daily before meals. 12/19/21  Yes Plotnikov, Evie Lacks, MD  ?Semaglutide,0.25 or 0.5MG/DOS, (OZEMPIC, 0.25 OR 0.5 MG/DOSE,) 2 MG/3ML SOPN Inject 0.25 mg into the skin once a week for four weeks, then 0.5 mg once a week ?Patient taking differently: Inject 0.5 mg into the skin every Tuesday. 01/18/22  Yes   ?spironolactone (ALDACTONE) 50 MG tablet Take 1 tablet by mouth every day. ?Patient taking differently: Take 50 mg by mouth daily. 11/30/21  Yes   ?SYRINGE-NEEDLE, DISP, 3 ML 25G X 5/8" 3 ML MISC Use to inject B12 under the skin 12/01/21  Yes Plotnikov, Evie Lacks, MD  ?cefdinir (OMNICEF)  300 MG capsule Take 1 capsule (300 mg total) by mouth 2 (two) times daily. ?Patient not taking: Reported on 03/05/2022 02/23/22   Plotnikov, Evie Lacks, MD  ?dapagliflozin propanediol (FARXIGA) 10 MG TABS tablet Take 1 tablet by mouth once a day 07/13/21     ?pantoprazole (PROTONIX) 40 MG tablet Take 1 tablet (40 mg total) by mouth 2 (two) times daily. ?Patient not taking: Reported on 03/05/2022 12/22/21   Plotnikov, Evie Lacks, MD  ?Vitamin D, Ergocalciferol, (DRISDOL) 1.25 MG (50000 UNIT) CAPS capsule Take 1 capsule (50,000 Units total) by mouth every 7 (seven) days. ?Patient not taking: Reported on 03/05/2022 12/01/21   Plotnikov, Evie Lacks, MD  ? ? ?Inpatient Medications: ?Scheduled Meds: ? ?Continuous Infusions: ? ?PRN Meds: ? ? ?Allergies:    ?Allergies  ?Allergen Reactions  ? Metronidazole Swelling  ?  Throat swelling  ? Shellfish Allergy Swelling  ?  Swelling of the throat  ? Empagliflozin-Linagliptin Other (See Comments)  ? Lisinopril Other (See Comments)  ? Metformin Hcl Other (See Comments)  ? Prednisone Other (See  Comments)  ? Dulaglutide Rash  ? Liraglutide Rash  ? ? ?Social History:   ?Social History  ? ?Socioeconomic History  ? Marital status: Married  ?  Spouse name: Not on file  ? Number of children: Not on

## 2022-03-05 NOTE — Progress Notes (Signed)
? ?Primary Cardiologist:  Oval Linsey ? ?Subjective:  ?Denies SSCP, palpitations or Dyspnea ?Abdominal cramping  ? ?Objective:  ?Vitals:  ? 03/05/22 0722 03/05/22 0745 03/05/22 0845 03/05/22 0930  ?BP: (!) 134/112 102/66 115/90 119/74  ?Pulse: (!) 109 (!) 104 (!) 103 (!) 106  ?Resp: 16 16 19 20   ?Temp:      ?TempSrc:      ?SpO2: 99% 100% 99% 98%  ?Weight:      ?Height:      ? ? ?Intake/Output from previous day: ?No intake or output data in the 24 hours ending 03/05/22 0942 ? ?Physical Exam: ?Affect appropriate ?Healthy:  appears stated age ?HEENT: normal ?Neck supple with no adenopathy ?JVP normal no bruits no thyromegaly ?Lungs clear with no wheezing and good diaphragmatic motion ?Heart:  S1/S2 no murmur, no rub, gallop or click ?PMI normal ?Abdomen: benighn, BS positve, no tenderness, no AAA ?no bruit.  No HSM or HJR ?Distal pulses intact with no bruits ?No edema ?Neuro non-focal ?Skin warm and dry ?No muscular weakness ? ? ?Lab Results: ?Basic Metabolic Panel: ?Recent Labs  ?  03/04/22 ?1802 03/05/22 ?0802  ?NA 136 138  ?K 3.7 3.9  ?CL 106 110  ?CO2 22 21*  ?GLUCOSE 147* 255*  ?BUN 8 6  ?CREATININE 0.63 0.61  ?CALCIUM 9.6 9.3  ?MG  --  1.8  ? ?Liver Function Tests: ?Recent Labs  ?  03/04/22 ?1430 03/05/22 ?0802  ?AST 13 20  ?ALT 17 22  ?ALKPHOS 61 50  ?BILITOT 1.1 1.2  ?PROT 7.9 7.0  ?ALBUMIN 4.4 3.5  ? ?No results for input(s): LIPASE, AMYLASE in the last 72 hours. ?CBC: ?Recent Labs  ?  03/04/22 ?1430 03/04/22 ?1802 03/05/22 ?0802  ?WBC 7.5 6.8 6.4  ?NEUTROABS 3.4  --  3.4  ?HGB 14.5 14.3 14.4  ?HCT 43.4 42.2 44.1  ?MCV 83.0 82.3 83.1  ?PLT 288.0 286 266  ? ?Cardiac Enzymes: ?No results for input(s): CKTOTAL, CKMB, CKMBINDEX, TROPONINI in the last 72 hours. ?BNP: ?Invalid input(s): POCBNP ?D-Dimer: ?No results for input(s): DDIMER in the last 72 hours. ?Hemoglobin A1C: ?No results for input(s): HGBA1C in the last 72 hours. ?Fasting Lipid Panel: ?Recent Labs  ?  03/05/22 ?0802  ?CHOL 116  ?HDL 34*  ?Nunda 68   ?TRIG 71  ?CHOLHDL 3.4  ? ?Thyroid Function Tests: ?Recent Labs  ?  03/05/22 ?0732  ?TSH 0.048*  ? ?Anemia Panel: ?No results for input(s): VITAMINB12, FOLATE, FERRITIN, TIBC, IRON, RETICCTPCT in the last 72 hours. ? ?Imaging: ?DG Chest 1 View ? ?Result Date: 03/04/2022 ?CLINICAL DATA:  Chest pain, dizziness EXAM: CHEST  1 VIEW COMPARISON:  07/23/2017 FINDINGS: Normal heart size, mediastinal contours, and pulmonary vascularity. Lungs clear. No pleural effusion or pneumothorax. Bones unremarkable. IMPRESSION: No acute abnormalities. Electronically Signed   By: Lavonia Dana M.D.   On: 03/04/2022 19:20  ? ?CT Angio Chest PE W and/or Wo Contrast ? ?Result Date: 03/05/2022 ?CLINICAL DATA:  Chest pain radiates down left arm. Dizziness. PE suspected. EXAM: CT ANGIOGRAPHY CHEST WITH CONTRAST TECHNIQUE: Multidetector CT imaging of the chest was performed using the standard protocol during bolus administration of intravenous contrast. Multiplanar CT image reconstructions and MIPs were obtained to evaluate the vascular anatomy. RADIATION DOSE REDUCTION: This exam was performed according to the departmental dose-optimization program which includes automated exposure control, adjustment of the mA and/or kV according to patient size and/or use of iterative reconstruction technique. CONTRAST:  22m OMNIPAQUE IOHEXOL 350 MG/ML SOLN COMPARISON:  11/29/2021  CT heart.  CTA chest 07/23/2017. FINDINGS: Cardiovascular: The heart size is normal. No substantial pericardial effusion. No thoracic aortic aneurysm. No substantial atherosclerosis of the thoracic aorta. There is no filling defect within the opacified pulmonary arteries to suggest the presence of an acute pulmonary embolus. Mediastinum/Nodes: No mediastinal lymphadenopathy. There is no hilar lymphadenopathy. The esophagus has normal imaging features. There is no axillary lymphadenopathy. Lungs/Pleura: No suspicious pulmonary nodule or mass. No focal airspace consolidation. No  pleural effusion. Upper Abdomen: Unremarkable. Musculoskeletal: No worrisome lytic or sclerotic osseous abnormality. Review of the MIP images confirms the above findings. IMPRESSION: 1. No CT evidence for acute pulmonary embolus. 2. No acute findings in the chest. Electronically Signed   By: Misty Stanley M.D.   On: 03/05/2022 04:59   ? ?Cardiac Studies: ? ECG: SR/ST no ST abnormalities  ? ? Telemetry: ST rates 110-115  ? Echo:  ? ?Medications: ?  ? aspirin EC  81 mg Oral Daily  ? atorvastatin  20 mg Oral Daily  ? escitalopram  40 mg Oral Daily  ? pantoprazole  40 mg Oral BID  ? Probiotic  1 capsule Oral Daily  ? ?  ? sodium chloride 125 mL/hr at 03/05/22 1025  ? ? ?Assessment/Plan:  ? ?Chest Pain:  in setting of recent asthma flair and Chrohn's  with diarrhea and dehydration I reviewed her cardiac CTA from 11/29/21 and she has no epicardial fixed CAD with calcium score of only 1.6 Differential includes coronary artery dissection, spasm and myopericarditis.  However troponin only 133-> 95 TTE pending If EF normal , no RWMAls and no effusion ok to dc from cardiology standpoint Continue ASA/statin Add low dose beta blocker No indication to go to lab  CXR negative and CTA negative for PE ? ?Critical care time triaging , disposition interview, exam composing note and reviewing labs CTA;s and upcoming echo 35 minutes ? ? ?Cindy Long ?03/05/2022, 9:42 AM ? ? ? ?

## 2022-03-05 NOTE — ED Notes (Signed)
Refused enema. Pt is not constipated and had diarrhea episodes yesterday.  ?

## 2022-03-05 NOTE — Progress Notes (Signed)
?  Echocardiogram ?2D Echocardiogram has been performed. ? ?Cindy Long ?03/05/2022, 5:07 PM ?

## 2022-03-05 NOTE — ED Notes (Signed)
CBG collected. Result "312." RN, Alana, notified.  ?

## 2022-03-05 NOTE — ED Provider Notes (Signed)
?[facility] ?Provider Note ? ? ?CSN: [id number] ?Arrival date & time: 03/04/22  1721 ? ?  ? ?History ? ?Chief Complaint  ?Patient presents with  ? Dizziness  ? Chest Pain  ? ? ?Cindy Long is a 46 y.o. female. ? ?HPI ? ?Patient with medical history including hypertension, diabetes, presents with complaints of headaches, nausea, vomiting, diarrhea and chest pain.  Patient states that she woke up this morning and felt slight dizziness, felt as if the room was spinning, this resolve on its own, then while she was at work she had a sudden onset of left-sided chest pain, felt that rating down to her left arm, she felt slightly short of breath and nauseated  lasted approximately 2 to 4 minutes.  She states that she feels generalized weak, denies unilateral weakness, change in vision, paresthesias in the upper or lower extremities no recent head trauma, not on anticoag's.  She also notes that her heart rate has been elevated especially while she was at her primary office today but denies any actual heart palpitations, she denies illicit drug use or increased caffeine consumption.  She notes that she was sick about 2 weeks ago with an upper URI it was negative for COVID.  She has no cardiac history, no history of PEs or DVTs currently not on hormone therapy, denies smoking history, no recent surgeries or prolonged immobilization no bleeding disorders. ? ?Reviewed patient's chart previous cardiology notes primary care notes, was seen at primary care office today troponins were obtained had elevated troponin of 153 and was sent here for further evaluation. ? ?Home Medications ?Prior to Admission medications   ?Medication Sig Start Date End Date Taking? Authorizing Provider  ?acetaminophen (TYLENOL) 500 MG tablet Take 1,000 mg by mouth every 6 (six) hours as needed for moderate pain or headache.   Yes [provider]  ?albuterol (PROVENTIL HFA;VENTOLIN HFA) 108 (90 Base)  MCG/ACT inhaler Inhale 1-2 puffs into the lungs every 6 (six) hours as needed for wheezing or shortness of breath.   Yes [provider]  ?aspirin EC 81 MG tablet Take 1 tablet (81 mg total) by mouth daily. Swallow whole. 01/17/22  Yes Marylu Lund., NP  ?atorvastatin (LIPITOR) 20 MG tablet Take 1 tablet by mouth daily. 01/17/22 01/12/23 Yes Marylu Lund., NP  ?Bacillus Coagulans-Inulin (PROBIOTIC) 1-250 BILLION-MG CAPS Take 1 capsule by mouth daily.   Yes [provider]  ?Continuous Blood Gluc Sensor (DEXCOM G6 SENSOR) MISC Change sensor every 10 days 01/17/22  Yes   ?Continuous Blood Gluc Transmit (DEXCOM G6 TRANSMITTER) MISC Use as directed for 90 days 01/17/22  Yes   ?cyanocobalamin (,VITAMIN B-12,) 1000 MCG/ML injection Inject 1 ml (1000 mcg) subcutaneously daily for 1 week, then weekly for 1 month, then once every 2 weeks ?Patient taking differently: Inject 1,000 mcg into the skin every 14 (fourteen) days. 12/01/21  Yes Plotnikov, Evie Lacks, MD  ?escitalopram (LEXAPRO) 20 MG tablet TAKE 2 TABLETS BY MOUTH ONCE A DAY. 11/25/21  Yes   ?HYDROcodone bit-homatropine (HYCODAN) 5-1.5 MG/5ML syrup Take 5 mLs by mouth every 8 (eight) hours as needed for cough. 02/23/22  Yes Plotnikov, Evie Lacks, MD  ?hyoscyamine (LEVBID) 0.375 MG 12 hr tablet Take 1 tablet  by mouth as needed as directed (30 days). ?Patient taking differently: Take 0.375 mg by mouth daily as needed for cramping. 11/25/21  Yes Ronnette Juniper, MD  ?ibuprofen (ADVIL) 200 MG tablet Take 800 mg by  mouth every 6 (six) hours as needed for moderate pain or headache.   Yes [provider]  ?inFLIXimab (REMICADE) 100 MG injection every 8 (eight) weeks.   Yes [provider]  ?Insulin Pen Needle 32G X 4 MM MISC by Does not apply route. BD U/F Pen Needles Nano 4 mm x 32 G; use with insulin   Yes [provider]  ?insulin regular human CONCENTRATED (HUMULIN R U-500 KWIKPEN) 500 UNIT/ML kwikpen Inject 90 units under the  skin at lunch and 40 units at evening meal ?Patient taking differently: Inject 10-90 Units into the skin See admin instructions. 10 in the morning ?90 at lunch ?50 at dinner 05/26/21  Yes   ?LORazepam (ATIVAN) 1 MG tablet Take 1/2 tablet by mouth once daily as needed ?Patient taking differently: Take 0.5 mg by mouth daily as needed for anxiety. 11/25/21  Yes   ?losartan (COZAAR) 50 MG tablet Take 50 mg by mouth daily. 10/18/19  Yes [provider]  ?medroxyPROGESTERone Acetate (DEPO-PROVERA) 150 MG/ML SUSY Inject 1 mL into the muscle every 3 months 01/06/22  Yes   ?MELATONIN PO Take 20 mg by mouth at bedtime as needed (sleep).   Yes [provider]  ?nystatin-triamcinolone (MYCOLOG II) cream APPLY TO THE AFFECTED AREA(S) TWO TIMES DAILY AS DIRECTED ?Patient taking differently: Apply 1 application. topically 2 (two) times daily as needed (rash). 09/25/21  Yes   ?promethazine (PHENERGAN) 25 MG tablet TAKE 1 TABLET BY MOUTH EVERY 8 HOURS ?Patient taking differently: Take 25 mg by mouth every 8 (eight) hours as needed for vomiting or nausea. 11/25/21  Yes   ?repaglinide (PRANDIN) 1 MG tablet Take 1 tablet (1 mg total) by mouth 3 (three) times daily before meals. 12/19/21  Yes Plotnikov, Evie Lacks, MD  ?Semaglutide,0.25 or 0.5MG/DOS, (OZEMPIC, 0.25 OR 0.5 MG/DOSE,) 2 MG/3ML SOPN Inject 0.25 mg into the skin once a week for four weeks, then 0.5 mg once a week ?Patient taking differently: Inject 0.5 mg into the skin every Tuesday. 01/18/22  Yes   ?spironolactone (ALDACTONE) 50 MG tablet Take 1 tablet by mouth every day. ?Patient taking differently: Take 50 mg by mouth daily. 11/30/21  Yes   ?SYRINGE-NEEDLE, DISP, 3 ML 25G X 5/8" 3 ML MISC Use to inject B12 under the skin 12/01/21  Yes Plotnikov, Evie Lacks, MD  ?cefdinir (OMNICEF) 300 MG capsule Take 1 capsule (300 mg total) by mouth 2 (two) times daily. ?Patient not taking: Reported on 03/05/2022 02/23/22   Plotnikov, Evie Lacks, MD  ?dapagliflozin propanediol  (FARXIGA) 10 MG TABS tablet Take 1 tablet by mouth once a day 07/13/21     ?pantoprazole (PROTONIX) 40 MG tablet Take 1 tablet (40 mg total) by mouth 2 (two) times daily. ?Patient not taking: Reported on 03/05/2022 12/22/21   Plotnikov, Evie Lacks, MD  ?Vitamin D, Ergocalciferol, (DRISDOL) 1.25 MG (50000 UNIT) CAPS capsule Take 1 capsule (50,000 Units total) by mouth every 7 (seven) days. ?Patient not taking: Reported on 03/05/2022 12/01/21   Plotnikov, Evie Lacks, MD  ?   ? ?Allergies    ?Metronidazole, Shellfish allergy, Empagliflozin-linagliptin, Lisinopril, Metformin hcl, Prednisone, Dulaglutide, and Liraglutide   ? ?Review of Systems   ?Review of Systems  ?Constitutional:  Positive for fatigue. Negative for chills and fever.  ?Respiratory:  Negative for shortness of breath.   ?Cardiovascular:  Positive for palpitations. Negative for chest pain.  ?Gastrointestinal:  Negative for abdominal pain.  ?Neurological:  Negative for headaches.  ? ?Physical Exam ?Updated Vital  Signs ?BP (!) 134/112   Pulse (!) 109   Temp 98.9 ?F (37.2 ?C) (Oral)   Resp 16   Ht 5' 6"  (1.676 m)   Wt 67.6 kg   SpO2 99%   BMI 24.05 kg/m?  ?Physical Exam ?Vitals and nursing note reviewed.  ?Constitutional:   ?   General: She is not in acute distress. ?   Appearance: She is not ill-appearing.  ?HENT:  ?   Head: Normocephalic and atraumatic.  ?   Nose: No congestion.  ?Eyes:  ?   Conjunctiva/sclera: Conjunctivae normal.  ?Cardiovascular:  ?   Rate and Rhythm: Regular rhythm. Tachycardia present.  ?   Pulses: Normal pulses.  ?   Heart sounds: No murmur heard. ?  No friction rub. No gallop.  ?Pulmonary:  ?   Effort: No respiratory distress.  ?   Breath sounds: No wheezing, rhonchi or rales.  ?Abdominal:  ?   Palpations: Abdomen is soft.  ?   Tenderness: There is no abdominal tenderness. There is no right CVA tenderness or left CVA tenderness.  ?Musculoskeletal:  ?   Right lower leg: No edema.  ?   Left lower leg: No edema.  ?Skin: ?   General: Skin  is warm and dry.  ?Neurological:  ?   Mental Status: She is alert.  ?Psychiatric:     ?   Mood and Affect: Mood normal.  ? ? ?ED Results / Procedures / Treatments   ?Labs ?(all labs ordered are listed, but only abnorm

## 2022-03-05 NOTE — H&P (Signed)
?History and Physical  ? ? ?Cindy Long PNT:[id number] DOB: 03-13-1976 DOA: 03/04/2022 ? ?PCP: Plotnikov, Evie Lacks, MD  ?Patient coming from: Home ? ?I have personally briefly reviewed patient's old medical records in [place] ? ?Chief Complaint: Chest pain/elevated troponin ? ?HPI: Cindy Long is a 46 y.o. female with medical history significant of, type 2 diabetes, hypertension, GERD, hyperlipidemia presented to the ED with a 1 day history of nausea, headaches, 4 episodes of diarrhea 1 day prior to admission which was yellow with mucus.  Patient does endorse some chest pain nonexertional while at work with radiation to the left upper extremity lasting about 4 minutes and described as a dull aching pain.  Patient does endorse intermittent chest pain.  Patient denies any shortness of breath.  Patient also noted some palpitations.  Patient presented to PCPs office, EKG was done noted to be sinus tachycardia, patient stated labs obtained and patient sent home.  While at home patient was called to come to the ED due to elevated troponins.  Patient denies any fevers, no melena, no hematemesis, no hematochezia, no syncopal episode.  Patient does endorse chills, nausea, lightheadedness, dizziness. ? ?ED Course: Patient seen in the ED noted to have elevated troponin of 133 down to 95.  Basic metabolic profile unremarkable.  CBC done unremarkable.  TSH noted of 0.09 with a normal free T4.  COVID-19 PCR negative.  HIV nonreactive.  Urinalysis not done.  Chest x-ray no acute abnormalities.  EKG with a sinus tachycardia.  Patient seen in the ED by cardiology, who felt patient had likely type II demand ischemia secondary to hypovolemia secondary to diarrhea.  Patient noted to have risk factors and cardiology recommended admission for observation and further evaluation.  Cardiology recommended 2D echo be done. ? ?Review of Systems: As per HPI otherwise all other systems reviewed and are negative. ? ?Past  Medical History:  ?Diagnosis Date  ? Chest pressure 11/18/2021  ? Crohn disease ([place])   ? Diabetes mellitus   ? IDDM  ? Essential hypertension 11/18/2021  ? GERD (gastroesophageal reflux disease)   ? no meds currently  ? High cholesterol   ? Hypertension   ? Mastitis   ? right breast  ? Postpartum care following cesarean delivery (2/10) 12/17/2013  ? Pure hypercholesterolemia 11/18/2021  ? ? ?Past Surgical History:  ?Procedure Laterality Date  ? CERVICAL CERCLAGE    ? CERVICAL CERCLAGE N/A 06/21/2013  ? Procedure: [facility] CERCLAGE CERVICAL;  Surgeon: Marvene Staff, MD;  Location: [facility];  Service: Gynecology;  Laterality: N/A;  ? CESAREAN SECTION    ? CESAREAN SECTION N/A 12/17/2013  ? Procedure: Repeat CESAREAN SECTION with Cerclage Removal;  Surgeon: Marvene Staff, MD;  Location: [facility];  Service: Obstetrics;  Laterality: N/A;  EDD: 12/22/13  ? CHOLECYSTECTOMY    ? CHOLECYSTECTOMY OPEN  08/2011  ? 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 ? ?Social History ? reports that she has never smoked. She has never used smokeless tobacco. She reports current alcohol use. She reports that she does not use drugs. ? ?Allergies  ?Allergen Reactions  ? Metronidazole Swelling  ?  Throat swelling  ? Shellfish Allergy Swelling  ?  Swelling of the throat  ? Empagliflozin-Linagliptin Other (See Comments)  ? Lisinopril Other (See Comments)  ? Metformin Hcl Other (See Comments)  ? Prednisone Other (See Comments)  ? Dulaglutide Rash  ? Liraglutide Rash  ? ? ?Family History  ?Problem Relation Age of Onset  ?  Hypertension Mother   ? Hypercholesterolemia Mother   ? Hypertension Maternal Grandmother   ? Cancer Maternal Grandmother   ?     breast, colon  ? Diabetes Maternal Grandmother   ? Hypertension Maternal Grandfather   ? Diabetes Maternal Aunt   ? Heart attack Cousin 42  ? ?Mother alive age 61 with hypertension, hyperlipidemia.  States does not know her father. ? ?Prior to Admission medications   ?Medication Sig Start  Date End Date Taking? Authorizing Provider  ?acetaminophen (TYLENOL) 500 MG tablet Take 1,000 mg by mouth every 6 (six) hours as needed for moderate pain or headache.   Yes [provider]  ?albuterol (PROVENTIL HFA;VENTOLIN HFA) 108 (90 Base) MCG/ACT inhaler Inhale 1-2 puffs into the lungs every 6 (six) hours as needed for wheezing or shortness of breath.   Yes [provider]  ?aspirin EC 81 MG tablet Take 1 tablet (81 mg total) by mouth daily. Swallow whole. 01/17/22  Yes Marylu Lund., NP  ?atorvastatin (LIPITOR) 20 MG tablet Take 1 tablet by mouth daily. 01/17/22 01/12/23 Yes Marylu Lund., NP  ?Bacillus Coagulans-Inulin (PROBIOTIC) 1-250 BILLION-MG CAPS Take 1 capsule by mouth daily.   Yes [provider]  ?Continuous Blood Gluc Sensor (DEXCOM G6 SENSOR) MISC Change sensor every 10 days 01/17/22  Yes   ?Continuous Blood Gluc Transmit (DEXCOM G6 TRANSMITTER) MISC Use as directed for 90 days 01/17/22  Yes   ?cyanocobalamin (,VITAMIN B-12,) 1000 MCG/ML injection Inject 1 ml (1000 mcg) subcutaneously daily for 1 week, then weekly for 1 month, then once every 2 weeks ?Patient taking differently: Inject 1,000 mcg into the skin every 14 (fourteen) days. 12/01/21  Yes Plotnikov, Evie Lacks, MD  ?escitalopram (LEXAPRO) 20 MG tablet TAKE 2 TABLETS BY MOUTH ONCE A DAY. 11/25/21  Yes   ?HYDROcodone bit-homatropine (HYCODAN) 5-1.5 MG/5ML syrup Take 5 mLs by mouth every 8 (eight) hours as needed for cough. 02/23/22  Yes Plotnikov, Evie Lacks, MD  ?hyoscyamine (LEVBID) 0.375 MG 12 hr tablet Take 1 tablet  by mouth as needed as directed (30 days). ?Patient taking differently: Take 0.375 mg by mouth daily as needed for cramping. 11/25/21  Yes Ronnette Juniper, MD  ?ibuprofen (ADVIL) 200 MG tablet Take 800 mg by mouth every 6 (six) hours as needed for moderate pain or headache.   Yes [provider]  ?inFLIXimab (REMICADE) 100 MG injection every 8 (eight) weeks.   Yes [provider]   ?Insulin Pen Needle 32G X 4 MM MISC by Does not apply route. BD U/F Pen Needles Nano 4 mm x 32 G; use with insulin   Yes [provider]  ?insulin regular human CONCENTRATED (HUMULIN R U-500 KWIKPEN) 500 UNIT/ML kwikpen Inject 90 units under the skin at lunch and 40 units at evening meal ?Patient taking differently: Inject 10-90 Units into the skin See admin instructions. 10 in the morning ?90 at lunch ?50 at dinner 05/26/21  Yes   ?LORazepam (ATIVAN) 1 MG tablet Take 1/2 tablet by mouth once daily as needed ?Patient taking differently: Take 0.5 mg by mouth daily as needed for anxiety. 11/25/21  Yes   ?losartan (COZAAR) 50 MG tablet Take 50 mg by mouth daily. 10/18/19  Yes [provider]  ?medroxyPROGESTERone Acetate (DEPO-PROVERA) 150 MG/ML SUSY Inject 1 mL into the muscle every 3 months 01/06/22  Yes   ?MELATONIN PO Take 20 mg by mouth at bedtime as needed (sleep).   Yes [provider]  ?nystatin-triamcinolone (  MYCOLOG II) cream APPLY TO THE AFFECTED AREA(S) TWO TIMES DAILY AS DIRECTED ?Patient taking differently: Apply 1 application. topically 2 (two) times daily as needed (rash). 09/25/21  Yes   ?promethazine (PHENERGAN) 25 MG tablet TAKE 1 TABLET BY MOUTH EVERY 8 HOURS ?Patient taking differently: Take 25 mg by mouth every 8 (eight) hours as needed for vomiting or nausea. 11/25/21  Yes   ?repaglinide (PRANDIN) 1 MG tablet Take 1 tablet (1 mg total) by mouth 3 (three) times daily before meals. 12/19/21  Yes Plotnikov, Evie Lacks, MD  ?Semaglutide,0.25 or 0.5MG/DOS, (OZEMPIC, 0.25 OR 0.5 MG/DOSE,) 2 MG/3ML SOPN Inject 0.25 mg into the skin once a week for four weeks, then 0.5 mg once a week ?Patient taking differently: Inject 0.5 mg into the skin every Tuesday. 01/18/22  Yes   ?spironolactone (ALDACTONE) 50 MG tablet Take 1 tablet by mouth every day. ?Patient taking differently: Take 50 mg by mouth daily. 11/30/21  Yes   ?SYRINGE-NEEDLE, DISP, 3 ML 25G X 5/8" 3 ML MISC Use to inject B12 under  the skin 12/01/21  Yes Plotnikov, Evie Lacks, MD  ?cefdinir (OMNICEF) 300 MG capsule Take 1 capsule (300 mg total) by mouth 2 (two) times daily. ?Patient not taking: Reported on 03/05/2022 02/23/22   Plotnikov, Al

## 2022-03-06 DIAGNOSIS — Z79899 Other long term (current) drug therapy: Secondary | ICD-10-CM | POA: Diagnosis not present

## 2022-03-06 DIAGNOSIS — R0789 Other chest pain: Secondary | ICD-10-CM | POA: Diagnosis not present

## 2022-03-06 DIAGNOSIS — Z20822 Contact with and (suspected) exposure to covid-19: Secondary | ICD-10-CM | POA: Diagnosis not present

## 2022-03-06 DIAGNOSIS — E785 Hyperlipidemia, unspecified: Secondary | ICD-10-CM | POA: Diagnosis not present

## 2022-03-06 DIAGNOSIS — I10 Essential (primary) hypertension: Secondary | ICD-10-CM | POA: Diagnosis not present

## 2022-03-06 DIAGNOSIS — R197 Diarrhea, unspecified: Secondary | ICD-10-CM | POA: Diagnosis not present

## 2022-03-06 DIAGNOSIS — Z7984 Long term (current) use of oral hypoglycemic drugs: Secondary | ICD-10-CM | POA: Diagnosis not present

## 2022-03-06 DIAGNOSIS — K50819 Crohn's disease of both small and large intestine with unspecified complications: Secondary | ICD-10-CM

## 2022-03-06 DIAGNOSIS — E119 Type 2 diabetes mellitus without complications: Secondary | ICD-10-CM | POA: Diagnosis not present

## 2022-03-06 DIAGNOSIS — E1165 Type 2 diabetes mellitus with hyperglycemia: Secondary | ICD-10-CM

## 2022-03-06 DIAGNOSIS — E860 Dehydration: Secondary | ICD-10-CM | POA: Diagnosis not present

## 2022-03-06 DIAGNOSIS — R000 Tachycardia, unspecified: Secondary | ICD-10-CM | POA: Diagnosis not present

## 2022-03-06 DIAGNOSIS — R079 Chest pain, unspecified: Secondary | ICD-10-CM | POA: Diagnosis not present

## 2022-03-06 DIAGNOSIS — Z7982 Long term (current) use of aspirin: Secondary | ICD-10-CM | POA: Diagnosis not present

## 2022-03-06 DIAGNOSIS — F329 Major depressive disorder, single episode, unspecified: Secondary | ICD-10-CM | POA: Diagnosis not present

## 2022-03-06 DIAGNOSIS — Z794 Long term (current) use of insulin: Secondary | ICD-10-CM | POA: Diagnosis not present

## 2022-03-06 DIAGNOSIS — R778 Other specified abnormalities of plasma proteins: Secondary | ICD-10-CM | POA: Diagnosis not present

## 2022-03-06 LAB — BASIC METABOLIC PANEL
Anion gap: 5 (ref 5–15)
BUN: 7 mg/dL (ref 6–20)
CO2: 21 mmol/L — ABNORMAL LOW (ref 22–32)
Calcium: 8.6 mg/dL — ABNORMAL LOW (ref 8.9–10.3)
Chloride: 114 mmol/L — ABNORMAL HIGH (ref 98–111)
Creatinine, Ser: 0.55 mg/dL (ref 0.44–1.00)
GFR, Estimated: >60 mL/min (ref >60)
Glucose, Bld: 245 mg/dL — ABNORMAL HIGH (ref 70–99)
Potassium: 4 mmol/L (ref 3.5–5.1)
Sodium: 140 mmol/L (ref 135–145)

## 2022-03-06 LAB — GLUCOSE, CAPILLARY
Glucose-Capillary: 207 mg/dL — ABNORMAL HIGH (ref 70–99)
Glucose-Capillary: 234 mg/dL — ABNORMAL HIGH (ref 70–99)
Glucose-Capillary: 273 mg/dL — ABNORMAL HIGH (ref 70–99)

## 2022-03-06 LAB — URINE CULTURE

## 2022-03-06 LAB — CBC
HCT: 36.9 % (ref 36.0–46.0)
Hemoglobin: 12.3 g/dL (ref 12.0–15.0)
MCH: 27.3 pg (ref 26.0–34.0)
MCHC: 33.3 g/dL (ref 30.0–36.0)
MCV: 81.8 fL (ref 80.0–100.0)
Platelets: 226 10*3/uL (ref 150–400)
RBC: 4.51 MIL/uL (ref 3.87–5.11)
RDW: 12.6 % (ref 11.5–15.5)
WBC: 5.4 10*3/uL (ref 4.0–10.5)
nRBC: 0 % (ref 0.0–0.2)

## 2022-03-06 LAB — MAGNESIUM: Magnesium: 1.8 mg/dL (ref 1.7–2.4)

## 2022-03-06 MED ORDER — INSULIN GLARGINE-YFGN 100 UNIT/ML ~~LOC~~ SOLN: 14.0000 [IU] | Freq: Every day | SUBCUTANEOUS | Status: DC

## 2022-03-06 MED ORDER — HUMULIN R U-500 KWIKPEN 500 UNIT/ML ~~LOC~~ SOPN: 5.0000 [IU] | PEN_INJECTOR | Freq: Three times a day (TID) | SUBCUTANEOUS | 3 refills | Status: DC

## 2022-03-06 MED ORDER — PANTOPRAZOLE SODIUM 40 MG PO TBEC
40.0000 mg | DELAYED_RELEASE_TABLET | Freq: Every day | ORAL | 1 refills | Status: DC
  Filled 2022-03-06: qty 30, 30d supply, fill #0
  Filled 2022-05-08 – 2022-05-26 (×2): qty 30, 30d supply, fill #1

## 2022-03-06 MED ORDER — METOPROLOL SUCCINATE ER 25 MG PO TB24
25.0000 mg | ORAL_TABLET | Freq: Every day | ORAL | 1 refills | Status: DC
  Filled 2022-03-06: qty 30, 30d supply, fill #0
  Filled 2022-05-08: qty 30, 30d supply, fill #1

## 2022-03-06 MED ORDER — MAGNESIUM SULFATE 2 GM/50ML IV SOLN
2.0000 g | Freq: Once | INTRAVENOUS | Status: AC
  Administered 2022-03-06: 2 g via INTRAVENOUS
  Filled 2022-03-06: qty 50

## 2022-03-06 MED ORDER — METOPROLOL SUCCINATE ER 25 MG PO TB24
25.0000 mg | ORAL_TABLET | Freq: Every day | ORAL | Status: DC
  Administered 2022-03-06: 25 mg via ORAL
  Filled 2022-03-06: qty 1

## 2022-03-06 MED ORDER — INSULIN GLARGINE-YFGN 100 UNIT/ML ~~LOC~~ SOLN
4.0000 [IU] | Freq: Once | SUBCUTANEOUS | Status: AC
  Administered 2022-03-06: 4 [IU] via SUBCUTANEOUS
  Filled 2022-03-06: qty 0.04

## 2022-03-06 NOTE — Discharge Summary (Signed)
Physician Discharge Summary  ?Cindy Long BCW:[id number] DOB: 1976-07-23 DOA: 03/04/2022 ? ?PCP: Plotnikov, Evie Lacks, MD ? ?Admit date: 03/04/2022 ?Discharge date: 03/06/2022 ? ?Time spent: 60 minutes ? ?Recommendations for Outpatient Follow-up:  ?Follow-up with Dr. Buddy Duty, endocrinology in 2 weeks.  On follow-up patient's diabetes and diabetic medications will need to be reassessed as patient did state reason for noncompliance was due to hypoglycemic episodes in the evenings.  Patient's insulin doses have been decreased by approximately half.  Patient will also need repeat thyroid function studies done due to abnormal TSH noted on presentation with a normal free T4.  T3 pending at time of discharge. ?Follow-up with Plotnikov, Evie Lacks, MD in 2 weeks.  On follow-up patient need a basic metabolic profile, magnesium level done to follow-up on electrolytes and renal function. ?Follow-up with Dr. Skeet Latch, cardiology in 2 weeks. ? ? ?Discharge Diagnoses:  ?Principal Problem: ?  Chest pain ?Active Problems: ?  Dyslipidemia ?  Type II diabetes mellitus ([place]) ?  Essential hypertension ?  Crohn's disease ([place]) ?  Depression ?  Crohn's disease of small and large intestines ([place]) ?  Mild intermittent asthma with (acute) exacerbation ?  Tachycardia ?  Diarrhea ?  Dehydration ?  Elevated troponin ? ? ?Discharge Condition: Stable and improved ? ?Diet recommendation: Heart healthy/carb modified ? ?Filed Weights  ? 03/04/22 1734 03/05/22 1310 03/06/22 0455  ?Weight: 67.6 kg 70.1 kg 70.9 kg  ? ? ?History of present illness:  ?Cindy Long is a 46 y.o. female with medical history significant of, type 2 diabetes, hypertension, GERD, hyperlipidemia presented to the ED with a 1 day history of nausea, headaches, 4 episodes of diarrhea 1 day prior to admission which was yellow with mucus.  Patient does endorse some chest pain nonexertional while at work with radiation to the left upper extremity lasting about 4 minutes and  described as a dull aching pain.  Patient does endorse intermittent chest pain.  Patient denies any shortness of breath.  Patient also noted some palpitations.  Patient presented to PCPs office, EKG was done noted to be sinus tachycardia, patient stated labs obtained and patient sent home.  While at home patient was called to come to the ED due to elevated troponins.  Patient denies any fevers, no melena, no hematemesis, no hematochezia, no syncopal episode.  Patient does endorse chills, nausea, lightheadedness, dizziness. ?  ?ED Course: Patient seen in the ED noted to have elevated troponin of 133 down to 95.  Basic metabolic profile unremarkable.  CBC done unremarkable.  TSH noted of 0.09 with a normal free T4.  COVID-19 PCR negative.  HIV nonreactive.  Urinalysis not done.  Chest x-ray no acute abnormalities.  EKG with a sinus tachycardia.  Patient seen in the ED by cardiology, who felt patient had likely type II demand ischemia secondary to hypovolemia secondary to diarrhea.  Patient noted to have risk factors and cardiology recommended admission for observation and further evaluation.  Cardiology recommended 2D echo be done ? ?Hospital Course:  ?#1 chest pain/elevated troponin ?-Patient presented with chest pain intermittent in nature, noted to have elevated troponin, multiple risk factors.  Noted to have elevated troponin initially which is trending down.  Concern for type II. ?-Patient seen by cardiology recommending 2D echo which was done with EF of 55%, no RWMA.  No effusion noted. ?-Patient remained chest pain-free during the hospitalization of followed by cardiology. ?-Fasting lipid panel obtained with a LDL of 68. ?-Cardiac enzymes trended down. ?-  Patient maintained on home regimen aspirin, Lipitor. ?-Cardiology recommended addition of low-dose beta-blocker and patient started on Toprol-XL 25 mg daily. ?-Cardiology reviewed patient's cardiac CTA from 11/29/2021 and it was noted that patient had no  epicardial fixed CAD with a calcium score of only 1.6. ?-Per cardiology no further inpatient work-up needed with outpatient follow-up with primary cardiologist. ?-Patient will be discharged in stable and improved condition. ? ?2.  GERD ?Patient maintained PPI. ?Outpatient follow-up ? ?3.  Poorly controlled diabetes mellitus type 2 ?-Hemoglobin A1c 10.7 ?-Patient placed on Semglee 14 units daily during the hospitalization as well as a sliding scale insulin.  ?-Patient did state some noncompliance due to hypoglycemic spells at home and usually in the evenings.  ?-Patient advised to be in touch with her primary endocrinologist when concerned about hypoglycemic spells.  ?-Patient's home regimen was held during the hospitalization and patient's home regimen insulin will be decreased to approximately half home doses for her lunch and supper regiments.  ?-Outpatient follow-up with primary endocrinologist 2 weeks post discharge.   ? ?4.  Dehydration ?-Secondary to GI losses from diarrhea. ?-Patient hydrated with IV fluids and was euvolemic by day of discharge. ? ?5.  Diarrhea ?-Patient with 4 episodes of diarrhea 1 day prior to admission, history of Crohn's disease. ?-Patient had 2 episodes of diarrhea during the hospitalization 1 described more as a soft mushy stool and the other one as loose.   ?-Patient remained stable.   ?-Outpatient follow-up with PCP and primary gastroenterologist if patient with worsening diarrhea post discharge.   ? ?6.  Depression ?-Remained stable.   ?-Patient placed back on home regimen Lexapro.  ? ?7.  Abnormal TSH ?-Patient with a TSH of 0.09 with repeat TSH of 0.48. ?-Free T4. 1.00. ?-T3 pending at time of discharge. ?-Outpatient follow-up with primary endocrinologist will likely need repeat labs done on follow-up. ? ?8.  Sinus tachycardia ?-Likely secondary to dehydration, GI losses. ?-Hydrated IV fluids with improvement with tachycardia. ?-Patient started on low-dose beta-blocker per  cardiology recommendations. ? ?9.  Hypertension ?-Blood pressure borderline on admission as such patient's antihypertensive medications were held. ?-Patient hydrated with IV fluids with improvement with blood pressure by day of discharge. ?-Patient's home regimen antihypertensive medications will be resumed on discharge in addition to low-dose beta-blocker. ?-Outpatient follow-up with PCP and cardiology.. ? ?10.  Hyperlipidemia ?-LDL of 68. ?-Patient maintained on home regimen statin.   ?-Outpatient follow-up.   ?  ? ?Procedures: ?2D echo 03/05/2022 ? ? ?Consultations: ?Cardiology: Dr.Narcisse 03/05/2022 ? ?Discharge Exam: ?Vitals:  ? 03/06/22 0800 03/06/22 1116  ?BP: 133/74 131/81  ?Pulse: 86 79  ?Resp: 16   ?Temp: 98.6 ?F (37 ?C)   ?SpO2: 100% 97%  ? ? ?General: NAD ?Cardiovascular: Regular rate rhythm no murmurs rubs or gallops.  No JVD.  No lower extremity edema. ?Respiratory: Lungs clear to auscultation bilaterally.  No wheezes, no crackles, no rhonchi. ? ?Discharge Instructions ? ? ?Discharge Instructions   ? ? Diet - low sodium heart healthy   Complete by: As directed ?  ? Diet Carb Modified   Complete by: As directed ?  ? Increase activity slowly   Complete by: As directed ?  ? ?  ? ?Allergies as of 03/06/2022   ? ?   Reactions  ? Metronidazole Swelling  ? Throat swelling  ? Shellfish Allergy Swelling  ? Swelling of the throat  ? Empagliflozin-linagliptin Other (See Comments)  ? Lisinopril Other (See Comments)  ? Metformin Hcl Other (See  Comments)  ? Prednisone Other (See Comments)  ? Dulaglutide Rash  ? Liraglutide Rash  ? ?  ? ?  ?Medication List  ?  ? ?STOP taking these medications   ? ?cefdinir 300 MG capsule ?Commonly known as: OMNICEF ?  ?Vitamin D (Ergocalciferol) 1.25 MG (50000 UNIT) Caps capsule ?Commonly known as: DRISDOL ?  ? ?  ? ?TAKE these medications   ? ?acetaminophen 500 MG tablet ?Commonly known as: TYLENOL ?Take 1,000 mg by mouth every 6 (six) hours as needed for moderate pain or headache. ?   ?albuterol 108 (90 Base) MCG/ACT inhaler ?Commonly known as: VENTOLIN HFA ?Inhale 1-2 puffs into the lungs every 6 (six) hours as needed for wheezing or shortness of breath. ?  ?aspirin EC 81 MG tablet ?Take 1 ta

## 2022-03-06 NOTE — Plan of Care (Signed)
  Problem: Clinical Measurements: Goal: Cardiovascular complication will be avoided Outcome: Progressing   Problem: Pain Managment: Goal: General experience of comfort will improve Outcome: Progressing   Problem: Safety: Goal: Ability to remain free from injury will improve Outcome: Progressing   

## 2022-03-06 NOTE — Progress Notes (Signed)
? ?Primary Cardiologist:  Oval Linsey ? ?Subjective:  ?Denies SSCP, palpitations or Dyspnea ?Abdominal cramping  ? ?Objective:  ?Vitals:  ? 03/05/22 2023 03/06/22 0012 03/06/22 0455 03/06/22 0800  ?BP: 124/71 114/74 111/72 133/74  ?Pulse: 95 90 81 86  ?Resp: 18 17 16 16   ?Temp: 98.9 ?F (37.2 ?C) 98.5 ?F (36.9 ?C) 98.4 ?F (36.9 ?C) 98.6 ?F (37 ?C)  ?TempSrc: Oral Oral Oral Oral  ?SpO2: 100% 97% 96% 100%  ?Weight:   70.9 kg   ?Height:      ? ? ?Intake/Output from previous day: ? ?Intake/Output Summary (Last 24 hours) at 03/06/2022 0937 ?Last data filed at 03/06/2022 4696 ?Gross per 24 hour  ?Intake 4598.84 ml  ?Output 2 ml  ?Net 4596.84 ml  ? ? ?Physical Exam: ?Affect appropriate ?Healthy:  appears stated age ?HEENT: normal ?Neck supple with no adenopathy ?JVP normal no bruits no thyromegaly ?Lungs clear with no wheezing and good diaphragmatic motion ?Heart:  S1/S2 no murmur, no rub, gallop or click ?PMI normal ?Abdomen: benighn, BS positve, no tenderness, no AAA ?no bruit.  No HSM or HJR ?Distal pulses intact with no bruits ?No edema ?Neuro non-focal ?Skin warm and dry ?No muscular weakness ? ? ?Lab Results: ?Basic Metabolic Panel: ?Recent Labs  ?  03/05/22 ?0802 03/06/22 ?0329  ?NA 138 140  ?K 3.9 4.0  ?CL 110 114*  ?CO2 21* 21*  ?GLUCOSE 255* 245*  ?BUN 6 7  ?CREATININE 0.61 0.55  ?CALCIUM 9.3 8.6*  ?MG 1.8 1.8  ? ?Liver Function Tests: ?Recent Labs  ?  03/04/22 ?1430 03/05/22 ?0802  ?AST 13 20  ?ALT 17 22  ?ALKPHOS 61 50  ?BILITOT 1.1 1.2  ?PROT 7.9 7.0  ?ALBUMIN 4.4 3.5  ? ?No results for input(s): LIPASE, AMYLASE in the last 72 hours. ?CBC: ?Recent Labs  ?  03/04/22 ?1430 03/04/22 ?1802 03/05/22 ?0802 03/06/22 ?0329  ?WBC 7.5   < > 6.4 5.4  ?NEUTROABS 3.4  --  3.4  --   ?HGB 14.5   < > 14.4 12.3  ?HCT 43.4   < > 44.1 36.9  ?MCV 83.0   < > 83.1 81.8  ?PLT 288.0   < > 266 226  ? < > = values in this interval not displayed.  ? ?Cardiac Enzymes: ?No results for input(s): CKTOTAL, CKMB, CKMBINDEX, TROPONINI in the last  72 hours. ?BNP: ?Invalid input(s): POCBNP ?D-Dimer: ?No results for input(s): DDIMER in the last 72 hours. ?Hemoglobin A1C: ?Recent Labs  ?  03/05/22 ?1309  ?HGBA1C 10.7*  ? ?Fasting Lipid Panel: ?Recent Labs  ?  03/05/22 ?0802  ?CHOL 116  ?HDL 34*  ?[place] 68  ?TRIG 71  ?CHOLHDL 3.4  ? ?Thyroid Function Tests: ?Recent Labs  ?  03/05/22 ?0732  ?TSH 0.048*  ? ?Anemia Panel: ?No results for input(s): VITAMINB12, FOLATE, FERRITIN, TIBC, IRON, RETICCTPCT in the last 72 hours. ? ?Imaging: ?DG Chest 1 View ? ?Result Date: 03/04/2022 ?CLINICAL DATA:  Chest pain, dizziness EXAM: CHEST  1 VIEW COMPARISON:  07/23/2017 FINDINGS: Normal heart size, mediastinal contours, and pulmonary vascularity. Lungs clear. No pleural effusion or pneumothorax. Bones unremarkable. IMPRESSION: No acute abnormalities. Electronically Signed   By: Lavonia Dana M.D.   On: 03/04/2022 19:20  ? ?CT Angio Chest PE W and/or Wo Contrast ? ?Result Date: 03/05/2022 ?CLINICAL DATA:  Chest pain radiates down left arm. Dizziness. PE suspected. EXAM: CT ANGIOGRAPHY CHEST WITH CONTRAST TECHNIQUE: Multidetector CT imaging of the chest was performed using the standard  protocol during bolus administration of intravenous contrast. Multiplanar CT image reconstructions and MIPs were obtained to evaluate the vascular anatomy. RADIATION DOSE REDUCTION: This exam was performed according to the departmental dose-optimization program which includes automated exposure control, adjustment of the mA and/or kV according to patient size and/or use of iterative reconstruction technique. CONTRAST:  96m OMNIPAQUE IOHEXOL 350 MG/ML SOLN COMPARISON:  11/29/2021 CT heart.  CTA chest 07/23/2017. FINDINGS: Cardiovascular: The heart size is normal. No substantial pericardial effusion. No thoracic aortic aneurysm. No substantial atherosclerosis of the thoracic aorta. There is no filling defect within the opacified pulmonary arteries to suggest the presence of an acute pulmonary embolus.  Mediastinum/Nodes: No mediastinal lymphadenopathy. There is no hilar lymphadenopathy. The esophagus has normal imaging features. There is no axillary lymphadenopathy. Lungs/Pleura: No suspicious pulmonary nodule or mass. No focal airspace consolidation. No pleural effusion. Upper Abdomen: Unremarkable. Musculoskeletal: No worrisome lytic or sclerotic osseous abnormality. Review of the MIP images confirms the above findings. IMPRESSION: 1. No CT evidence for acute pulmonary embolus. 2. No acute findings in the chest. Electronically Signed   By: EMisty StanleyM.D.   On: 03/05/2022 04:59  ? ?ECHOCARDIOGRAM COMPLETE ? ?Result Date: 03/05/2022 ?   ECHOCARDIOGRAM REPORT   Patient Name:   Cindy GRUENHAGENDate of Exam: 03/05/2022 Medical Rec #:  [id number]         Height:       66.0 in Accession #:    [id number]        Weight:       154.6 lb Date of Birth:  [birth year]          BSA:          1.793 m? Patient Age:    89years           BP:           114/77 mmHg Patient Gender: F                  HR:           94 bpm. Exam Location:  Inpatient Procedure: 2D Echo, Cardiac Doppler and Color Doppler Indications:    Elevated troponin  History:        Patient has prior history of Echocardiogram examinations, most                 recent 08/09/2018.  Sonographer:    RMerrie RoofRDCS Referring Phys: 3[place] 1. No defined RWMA. Left ventricular ejection fraction, by estimation, is 55%. The left ventricle has normal function. The left ventricle has no regional wall motion abnormalities. Left ventricular diastolic parameters were normal.  2. Right ventricular systolic function is normal. The right ventricular size is normal.  3. The mitral valve is normal in structure. No evidence of mitral valve regurgitation. No evidence of mitral stenosis.  4. The aortic valve is normal in structure. Aortic valve regurgitation is not visualized. No aortic stenosis is present.  5. The inferior vena cava is normal in size  with greater than 50% respiratory variability, suggesting right atrial pressure of 3 mmHg. FINDINGS  Left Ventricle: No defined RWMA. Left ventricular ejection fraction, by estimation, is 55%. The left ventricle has normal function. The left ventricle has no regional wall motion abnormalities. The left ventricular internal cavity size was normal in size. There is no left ventricular hypertrophy. Left ventricular diastolic parameters were normal. Right Ventricle: The right ventricular size is normal. No  increase in right ventricular wall thickness. Right ventricular systolic function is normal. Left Atrium: Left atrial size was normal in size. Right Atrium: Right atrial size was normal in size. Pericardium: There is no evidence of pericardial effusion. Mitral Valve: The mitral valve is normal in structure. No evidence of mitral valve regurgitation. No evidence of mitral valve stenosis. Tricuspid Valve: The tricuspid valve is normal in structure. Tricuspid valve regurgitation is not demonstrated. No evidence of tricuspid stenosis. Aortic Valve: The aortic valve is normal in structure. Aortic valve regurgitation is not visualized. No aortic stenosis is present. Aortic valve mean gradient measures 2.0 mmHg. Aortic valve peak gradient measures 4.1 mmHg. Aortic valve area, by VTI measures 2.94 cm?. Pulmonic Valve: The pulmonic valve was normal in structure. Pulmonic valve regurgitation is not visualized. No evidence of pulmonic stenosis. Aorta: The aortic root is normal in size and structure. Venous: The inferior vena cava is normal in size with greater than 50% respiratory variability, suggesting right atrial pressure of 3 mmHg. IAS/Shunts: No atrial level shunt detected by color flow Doppler.  LEFT VENTRICLE PLAX 2D LVIDd:         3.80 cm   Diastology LVIDs:         2.60 cm   LV e' medial:    8.73 cm/s LV PW:         1.10 cm   LV E/e' medial:  8.0 LV IVS:        1.10 cm   LV e' lateral:   14.70 cm/s LVOT diam:     2.10  cm   LV E/e' lateral: 4.7 LV SV:         51 LV SV Index:   28 LVOT Area:     3.46 cm?  RIGHT VENTRICLE RV Basal diam:  3.00 cm RV S prime:     14.30 cm/s TAPSE (M-mode): 1.9 cm LEFT ATRIUM             Ind

## 2022-03-07 ENCOUNTER — Encounter (HOSPITAL_BASED_OUTPATIENT_CLINIC_OR_DEPARTMENT_OTHER): Payer: Self-pay

## 2022-03-07 ENCOUNTER — Other Ambulatory Visit (HOSPITAL_COMMUNITY): Payer: Self-pay

## 2022-03-08 ENCOUNTER — Telehealth: Payer: Self-pay | Admitting: Cardiovascular Disease

## 2022-03-08 NOTE — Telephone Encounter (Signed)
TOC scheduled for 03/17/22 at 1:20pm with Dr. Oval Linsey per staff message from Dr. Johnsie Cancel ?

## 2022-03-09 ENCOUNTER — Ambulatory Visit: Payer: 59 | Admitting: Internal Medicine

## 2022-03-09 ENCOUNTER — Encounter: Payer: Self-pay | Admitting: Internal Medicine

## 2022-03-09 DIAGNOSIS — K50919 Crohn's disease, unspecified, with unspecified complications: Secondary | ICD-10-CM

## 2022-03-09 DIAGNOSIS — R079 Chest pain, unspecified: Secondary | ICD-10-CM

## 2022-03-09 DIAGNOSIS — E538 Deficiency of other specified B group vitamins: Secondary | ICD-10-CM

## 2022-03-09 DIAGNOSIS — K50819 Crohn's disease of both small and large intestine with unspecified complications: Secondary | ICD-10-CM | POA: Diagnosis not present

## 2022-03-09 DIAGNOSIS — E1165 Type 2 diabetes mellitus with hyperglycemia: Secondary | ICD-10-CM

## 2022-03-09 DIAGNOSIS — F329 Major depressive disorder, single episode, unspecified: Secondary | ICD-10-CM | POA: Diagnosis not present

## 2022-03-09 DIAGNOSIS — Z794 Long term (current) use of insulin: Secondary | ICD-10-CM

## 2022-03-09 NOTE — Progress Notes (Signed)
? ?Subjective:  ?Patient ID: Cindy Long, female    DOB: 09/10/1976  Age: 46 y.o. MRN: [id number] ? ?CC: No chief complaint on file. ? ? ?HPI ?Cindy Long presents for post-hosp stay: CP, stress, diarrhea w/abd pain. Hospital stay was reviewed. ?CBGs are better in a 200 range ?Per hx: ? ?"Admit date: 03/04/2022 ?Discharge date: 03/06/2022 ?  ?Time spent: 60 minutes ?  ?Recommendations for Outpatient Follow-up:  ?Follow-up with Dr. Sharl Ma, endocrinology in 2 weeks.  On follow-up patient's diabetes and diabetic medications will need to be reassessed as patient did state reason for noncompliance was due to hypoglycemic episodes in the evenings.  Patient's insulin doses have been decreased by approximately half.  Patient will also need repeat thyroid function studies done due to abnormal TSH noted on presentation with a normal free T4.  T3 pending at time of discharge. ?Follow-up with Maleko Greulich, Georgina Quint, MD in 2 weeks.  On follow-up patient need a basic metabolic profile, magnesium level done to follow-up on electrolytes and renal function. ?Follow-up with Dr. Chilton Si, cardiology in 2 weeks. ?  ?  ?Discharge Diagnoses:  ?Principal Problem: ?  Chest pain ?Active Problems: ?  Dyslipidemia ?  Type II diabetes mellitus (HCC) ?  Essential hypertension ?  Crohn's disease (HCC) ?  Depression ?  Crohn's disease of small and large intestines (HCC) ?  Mild intermittent asthma with (acute) exacerbation ?  Tachycardia ?  Diarrhea ?  Dehydration ?  Elevated troponin ?  ?  ?Discharge Condition: Stable and improved ?  ?Diet recommendation: Heart healthy/carb modified ?  ?     ?Filed Weights  ?  03/04/22 1734 03/05/22 1310 03/06/22 0455  ?Weight: 67.6 kg 70.1 kg 70.9 kg  ?  ?  ?History of present illness:  ?Cindy Long is a 46 y.o. female with medical history significant of, type 2 diabetes, hypertension, GERD, hyperlipidemia presented to the ED with a 1 day history of nausea, headaches, 4 episodes of diarrhea  1 day prior to admission which was yellow with mucus.  Patient does endorse some chest pain nonexertional while at work with radiation to the left upper extremity lasting about 4 minutes and described as a dull aching pain.  Patient does endorse intermittent chest pain.  Patient denies any shortness of breath.  Patient also noted some palpitations.  Patient presented to PCPs office, EKG was done noted to be sinus tachycardia, patient stated labs obtained and patient sent home.  While at home patient was called to come to the ED due to elevated troponins.  Patient denies any fevers, no melena, no hematemesis, no hematochezia, no syncopal episode.  Patient does endorse chills, nausea, lightheadedness, dizziness. ?  ?ED Course: Patient seen in the ED noted to have elevated troponin of 133 down to 95.  Basic metabolic profile unremarkable.  CBC done unremarkable.  TSH noted of 0.09 with a normal free T4.  COVID-19 PCR negative.  HIV nonreactive.  Urinalysis not done.  Chest x-ray no acute abnormalities.  EKG with a sinus tachycardia.  Patient seen in the ED by cardiology, who felt patient had likely type II demand ischemia secondary to hypovolemia secondary to diarrhea.  Patient noted to have risk factors and cardiology recommended admission for observation and further evaluation.  Cardiology recommended 2D echo be done ?  ?Hospital Course:  ?#1 chest pain/elevated troponin ?-Patient presented with chest pain intermittent in nature, noted to have elevated troponin, multiple risk factors.  Noted to have elevated troponin  initially which is trending down.  Concern for type II. ?-Patient seen by cardiology recommending 2D echo which was done with EF of 55%, no RWMA.  No effusion noted. ?-Patient remained chest pain-free during the hospitalization of followed by cardiology. ?-Fasting lipid panel obtained with a LDL of 68. ?-Cardiac enzymes trended down. ?-Patient maintained on home regimen aspirin, Lipitor. ?-Cardiology  recommended addition of low-dose beta-blocker and patient started on Toprol-XL 25 mg daily. ?-Cardiology reviewed patient's cardiac CTA from 11/29/2021 and it was noted that patient had no epicardial fixed CAD with a calcium score of only 1.6. ?-Per cardiology no further inpatient work-up needed with outpatient follow-up with primary cardiologist. ?-Patient will be discharged in stable and improved condition. ? ?2.  GERD ?Patient maintained PPI. ?Outpatient follow-up ? ?3.  Poorly controlled diabetes mellitus type 2 ?-Hemoglobin A1c 10.7 ?-Patient placed on Semglee 14 units daily during the hospitalization as well as a sliding scale insulin.  ?-Patient did state some noncompliance due to hypoglycemic spells at home and usually in the evenings.  ?-Patient advised to be in touch with her primary endocrinologist when concerned about hypoglycemic spells.  ?-Patient's home regimen was held during the hospitalization and patient's home regimen insulin will be decreased to approximately half home doses for her lunch and supper regiments.  ?-Outpatient follow-up with primary endocrinologist 2 weeks post discharge.   ? ?4.  Dehydration ?-Secondary to GI losses from diarrhea. ?-Patient hydrated with IV fluids and was euvolemic by day of discharge. ? ?5.  Diarrhea ?-Patient with 4 episodes of diarrhea 1 day prior to admission, history of Crohn's disease. ?-Patient had 2 episodes of diarrhea during the hospitalization 1 described more as a soft mushy stool and the other one as loose.   ?-Patient remained stable.   ?-Outpatient follow-up with PCP and primary gastroenterologist if patient with worsening diarrhea post discharge.   ? ?6.  Depression ?-Remained stable.   ?-Patient placed back on home regimen Lexapro.  ? ?7.  Abnormal TSH ?-Patient with a TSH of 0.09 with repeat TSH of 0.48. ?-Free T4. 1.00. ?-T3 pending at time of discharge. ?-Outpatient follow-up with primary endocrinologist will likely need repeat labs done on  follow-up. ? ?8.  Sinus tachycardia ?-Likely secondary to dehydration, GI losses. ?-Hydrated IV fluids with improvement with tachycardia. ?-Patient started on low-dose beta-blocker per cardiology recommendations. ? ?9.  Hypertension ?-Blood pressure borderline on admission as such patient's antihypertensive medications were held. ?-Patient hydrated with IV fluids with improvement with blood pressure by day of discharge. ?-Patient's home regimen antihypertensive medications will be resumed on discharge in addition to low-dose beta-blocker. ?-Outpatient follow-up with PCP and cardiology.. ? ?10.  Hyperlipidemia ?-LDL of 68. ?-Patient maintained on home regimen statin.   ?-Outpatient follow-up.   ?  ?  ?Procedures: ?2D echo 03/05/2022" ? ?Outpatient Medications Prior to Visit  ?Medication Sig Dispense Refill  ? acetaminophen (TYLENOL) 500 MG tablet Take 1,000 mg by mouth every 6 (six) hours as needed for moderate pain or headache.    ? albuterol (PROVENTIL HFA;VENTOLIN HFA) 108 (90 Base) MCG/ACT inhaler Inhale 1-2 puffs into the lungs every 6 (six) hours as needed for wheezing or shortness of breath.    ? aspirin EC 81 MG tablet Take 1 tablet (81 mg total) by mouth daily. Swallow whole. 90 tablet 3  ? atorvastatin (LIPITOR) 20 MG tablet Take 1 tablet by mouth daily. 90 tablet 3  ? azaTHIOprine (IMURAN) 50 MG tablet 2 tab(s) orally once a day for 30 day(s)    ?  Bacillus Coagulans-Inulin (PROBIOTIC) 1-250 BILLION-MG CAPS Take 1 capsule by mouth daily.    ? Continuous Blood Gluc Sensor (DEXCOM G6 SENSOR) MISC Change sensor every 10 days 9 each 4  ? Continuous Blood Gluc Transmit (DEXCOM G6 TRANSMITTER) MISC Use as directed for 90 days 1 each 4  ? cyanocobalamin (,VITAMIN B-12,) 1000 MCG/ML injection Inject 1 ml (1000 mcg) subcutaneously daily for 1 week, then weekly for 1 month, then once every 2 weeks (Patient taking differently: Inject 1,000 mcg into the skin every 14 (fourteen) days.) 10 mL 6  ? dapagliflozin  propanediol (FARXIGA) 10 MG TABS tablet Take 1 tablet by mouth once a day 30 tablet 5  ? escitalopram (LEXAPRO) 20 MG tablet TAKE 2 TABLETS BY MOUTH ONCE A DAY. 180 tablet 3  ? HYDROcodone bit-homatropine (HYCODAN) 5

## 2022-03-09 NOTE — Assessment & Plan Note (Signed)
Recent. MI and PE ruled out. ?To work on Monday if [place] ?

## 2022-03-09 NOTE — Assessment & Plan Note (Signed)
Try gluten free diet 

## 2022-03-09 NOTE — Assessment & Plan Note (Signed)
A flare up with severe diarrhea and abdominal pain - recent.[name] ?She has been on Remicade for her Crohn's disease lately. She was on Humira before. ?Follow-up with GI Dr Therisa Doyne. ?She has been on Remicade for her Crohn's disease lately. She was on Humira before. ?Try gluten-free diet. ?Improved compliance with vitamin B-12 and vitamin D intake ?

## 2022-03-09 NOTE — Assessment & Plan Note (Signed)
Better ?Continuous CBG monitoring - on DexCom 6 ?

## 2022-03-09 NOTE — Patient Instructions (Signed)
Try gluten free diet  for 4-6 weeks ?

## 2022-03-09 NOTE — Assessment & Plan Note (Signed)
Worse. Stress at work. Discussed ?

## 2022-03-09 NOTE — Telephone Encounter (Signed)
Left message to call back  

## 2022-03-09 NOTE — Assessment & Plan Note (Signed)
On B12 

## 2022-03-10 ENCOUNTER — Other Ambulatory Visit (HOSPITAL_COMMUNITY): Payer: Self-pay

## 2022-03-11 LAB — T3: T3, Total: 279 ng/dL — ABNORMAL HIGH (ref 71–180)

## 2022-03-13 ENCOUNTER — Other Ambulatory Visit: Payer: Self-pay | Admitting: Internal Medicine

## 2022-03-13 DIAGNOSIS — R002 Palpitations: Secondary | ICD-10-CM

## 2022-03-14 ENCOUNTER — Telehealth: Payer: Self-pay | Admitting: *Deleted

## 2022-03-14 NOTE — Telephone Encounter (Signed)
Rec'd FMLA form from [employer] put in MD purple folder to fill out.Marland KitchenJohny Long ?

## 2022-03-15 ENCOUNTER — Telehealth: Payer: Self-pay | Admitting: Pharmacist

## 2022-03-15 ENCOUNTER — Other Ambulatory Visit (HOSPITAL_COMMUNITY): Payer: Self-pay

## 2022-03-15 MED ORDER — USTEKINUMAB 90 MG/ML ~~LOC~~ SOSY: PREFILLED_SYRINGE | SUBCUTANEOUS | 6 refills | Status: DC

## 2022-03-15 NOTE — Telephone Encounter (Signed)
Okay.  Thanks.

## 2022-03-15 NOTE — Telephone Encounter (Signed)
Called patient to schedule an appointment for the [employer] [facility]. I was unable to reach the patient so I left a HIPAA-compliant message requesting that the patient return my call.   Luke Van Ausdall, PharmD, BCACP, CPP Clinical Pharmacist [facility] [phone]  

## 2022-03-16 ENCOUNTER — Other Ambulatory Visit (HOSPITAL_COMMUNITY): Payer: Self-pay

## 2022-03-16 ENCOUNTER — Ambulatory Visit: Payer: 59 | Attending: Internal Medicine | Admitting: Pharmacist

## 2022-03-16 ENCOUNTER — Other Ambulatory Visit: Payer: Self-pay | Admitting: Pharmacist

## 2022-03-16 DIAGNOSIS — Z79899 Other long term (current) drug therapy: Secondary | ICD-10-CM

## 2022-03-16 MED ORDER — USTEKINUMAB 90 MG/ML ~~LOC~~ SOSY
PREFILLED_SYRINGE | SUBCUTANEOUS | 6 refills | Status: DC
  Filled 2022-03-16: qty 1, fill #0
  Filled 2022-06-02: qty 1, 56d supply, fill #0
  Filled 2022-08-09: qty 1, 56d supply, fill #1

## 2022-03-16 MED ORDER — USTEKINUMAB 130 MG/26ML IV SOLN
INTRAVENOUS | 0 refills | Status: DC
  Filled 2022-03-16: qty 78, 56d supply, fill #0

## 2022-03-16 MED ORDER — USTEKINUMAB 130 MG/26ML IV SOLN
INTRAVENOUS | 0 refills | Status: DC
Start: 2022-03-16 — End: 2023-05-23
  Filled 2022-03-16: qty 78, fill #0

## 2022-03-16 NOTE — Telephone Encounter (Signed)
Faxed back FMLA to [phone].Hanley Seamen original to pt for her records.Marland KitchenJohny Chess ?

## 2022-03-16 NOTE — Progress Notes (Signed)
?  S: ?Patient presents for review of their specialty medication therapy. ? ?Patient is about to start taking Stelara for Crohn's disease. Patient is managed by Dr. Therisa Doyne for this.  ? ?Adherence: has not yet started. IV infusion scheduled for 04/22/2022. ? ?Efficacy: has not started ? ?Dosing:  ?Crohn disease:  ?Induction: IV: ??55 kg: 260 mg as single dose ?>55 kg to 85 kg: 390 mg as single dose ?>85 kg: 520 mg as single dose ?Maintenance: SubQ: 90 mg every 8 weeks; begin maintenance dosing 8 weeks after the IV induction dose. ? ?Dose adjustments: ?Renal: no dose adjustments (has not been studied) ?Hepatic: no dose adjustments (has not been studied) ?Special populations: ? Patients >100 kg: May require higher dose to achieve adequate serum levels. ? ?Drug-drug interactions: none identified ? ?Screening: ?TB test: completed  ?Hepatitis: completed  ? ?Monitoring: ?S/sx of infection: none ?CBC: monitored by her specialist ?Reversible posterior leukoencephalopathy syndrome (RPLS - sx include headache, seizures, confusion, and visual disturbances): none ?Squamous cell skin carcinoma: none ? ?Dosage form specific issues: ? Latex: Packaging may contain natural latex rubber. ? Polysorbate 80: Some dosage forms may contain polysorbate 80 (also known as Tweens). Hypersensitivity reactions, usually a delayed reaction, have been reported following exposure to pharmaceutical products containing polysorbate 80 in certain individuals Dolly Rias, 2002; Lucente 2000; Lollie Marrow, [place]). Thrombocytopenia, ascites, pulmonary deterioration, and renal and hepatic failure have been reported in premature neonates after receiving parenteral products containing polysorbate 80 ([place], 1986; CDC, 1984). See manufacturer?s labeling. ? ? ?O: ? ?Lab Results  ?Component Value Date  ? WBC 5.4 03/06/2022  ? HGB 12.3 03/06/2022  ? HCT 36.9 03/06/2022  ? MCV 81.8 03/06/2022  ? PLT 226 03/06/2022  ? ? ?  Chemistry   ?   ?Component Value Date/Time  ? NA 140  03/06/2022 0329  ? NA 140 11/18/2021 1605  ? K 4.0 03/06/2022 0329  ? CL 114 (H) 03/06/2022 0329  ? CO2 21 (L) 03/06/2022 0329  ? BUN 7 03/06/2022 0329  ? BUN 9 11/18/2021 1605  ? CREATININE 0.55 03/06/2022 0329  ?    ?Component Value Date/Time  ? CALCIUM 8.6 (L) 03/06/2022 0329  ? ALKPHOS 50 03/05/2022 0802  ? AST 20 03/05/2022 0802  ? ALT 22 03/05/2022 0802  ? BILITOT 1.2 03/05/2022 0802  ?  ? ? ? ?A/P: ?1. Medication review: patient currently about to start Stelara for Crohn's disease. Reviewed the medication with the patient, including the following: Stelara, ustekinumab, is a TNF? blocker. Patient educated on purpose, proper use and potential adverse effects of Stelara.  Following instruction patient verbalized understanding of treatment plan. There is an increased risk of infection and malignancy with this medication. Do not give patients live vaccinations while they are on this medication. Subcutaneous: Administer by subcutaneous injection into the top of the thigh, abdomen, upper arms, or buttocks. Rotate sites. Do not inject into tender, bruised, erythematous, or indurated skin. Avoid areas of skin where psoriasis is present. Discard any unused portion. Intended for use under supervision of physician; self-injection may occur after proper training. If using the single-dose vial, a 1 mL syringe with a 27-gauge 1/2 inch needle is recommended. No recommendations for any changes. ? ?Benard Halsted, PharmD, BCACP, CPP ?Clinical Pharmacist ?[place] ?[phone] ? ? ? ? ? ? ?

## 2022-03-17 ENCOUNTER — Other Ambulatory Visit (HOSPITAL_COMMUNITY): Payer: Self-pay

## 2022-03-17 ENCOUNTER — Ambulatory Visit: Payer: 59 | Admitting: Sports Medicine

## 2022-03-17 ENCOUNTER — Ambulatory Visit (HOSPITAL_BASED_OUTPATIENT_CLINIC_OR_DEPARTMENT_OTHER): Payer: 59 | Admitting: Cardiovascular Disease

## 2022-03-17 ENCOUNTER — Encounter (HOSPITAL_BASED_OUTPATIENT_CLINIC_OR_DEPARTMENT_OTHER): Payer: Self-pay | Admitting: Cardiovascular Disease

## 2022-03-17 VITALS — BP 104/70 | HR 76 | Ht 66.0 in | Wt 151.1 lb

## 2022-03-17 DIAGNOSIS — R0789 Other chest pain: Secondary | ICD-10-CM

## 2022-03-17 DIAGNOSIS — I10 Essential (primary) hypertension: Secondary | ICD-10-CM

## 2022-03-17 DIAGNOSIS — E785 Hyperlipidemia, unspecified: Secondary | ICD-10-CM

## 2022-03-17 DIAGNOSIS — I2510 Atherosclerotic heart disease of native coronary artery without angina pectoris: Secondary | ICD-10-CM | POA: Diagnosis not present

## 2022-03-17 NOTE — Assessment & Plan Note (Addendum)
Her symptoms are noncardiac.  We discussed the fact that this may be anxiety related.  She does have some exertional dyspnea and is going to work on increasing her exercise. ?

## 2022-03-17 NOTE — Assessment & Plan Note (Signed)
Blood pressure is well controlled.  She does not have any lightheadedness.  Continue her regimen of metoprolol, losartan, and spironolactone. ?

## 2022-03-17 NOTE — Assessment & Plan Note (Signed)
Minimal plaque on her CT scan 11/2021.  Lipids are well controlled.  Continue aspirin and atorvastatin.  LDL goal is less than 70. ?

## 2022-03-17 NOTE — Progress Notes (Signed)
?Cardiology Office Note:   ? ?Date:  03/17/2022  ? ?ID:  SECRET Cindy Long, DOB Oct 08, 1976, MRN [id number] ? ?PCP:  Plotnikov, Evie Lacks, MD ?  ?Cindy Long ?Cardiologist:  Skeet Latch, MD    ? ?Referring MD: Cindy Anger, MD  ? ?No chief complaint on file. ? ? ?History of Present Illness:   ? ?Cindy Long is a 46 y.o. female with a hx of hypertension, hyperlipidemia, diabetes, GERD, and Crohn's disease, here for follow-up.  She established with me in 11/2021.  She was formerly a patient of Dr. Nadyne Long. She last saw Dr. Nadyne Long in 2019 for chest pain. She struggled to control her diabetes due to the cost of medications. She had an Echo 08/2018 with LVEF 55% and normal diastolic function. She had an exercise myoview 06/2018. She achieved 7 METs and had no ischemia. Her blood pressure was 206/72 on the stress test. Overall, she is feeling very fatigued and stressed. Her main concern was having episodes of right chest pressure/heaviness. This has been ongoing intermittently since 08/2021 - 09/2021, and does not seem to be worse with exertion. Also, she reported intermittent palpitations since 09/2021.  ? ?[name] had a coronary CTA 11/2021 with minimal plaque in the LAD and a 92nd percentile score.  She wore a monitor that showed rare PACs and PVCs.  She was seen in the ED 02/2022 with chest pain in the setting of recent asthma flare and Crohn's disease with diarrhea and dehydration.  Cardiac enzymes peaked at 133.  Echo with normal wall motion and EF.  It was thought to be due to possible spasm or myopericarditis.  Her diabetes was poorly controlled at that time with a hemoglobin A1c over 10.  Have some episodes of chest pressure.  She notes that this typically occurs in the setting of stressful situations like when she is at work or when she is driving.  She notes that she sometimes gets short of breath when she is cleaning her house but does not have chest pain with this.  She has not had  any lower extremity edema.  Her glucose is slowly improving.  Hemoglobin A1c was over 14 and it has improved to 10%.   ? ? ?Past Medical History:  ?Diagnosis Date  ? Chest pressure 11/18/2021  ? Crohn disease ([place])   ? Diabetes mellitus   ? IDDM  ? Essential hypertension 11/18/2021  ? GERD (gastroesophageal reflux disease)   ? no meds currently  ? High cholesterol   ? Hypertension   ? Mastitis   ? right breast  ? Postpartum care following cesarean delivery (2/10) 12/17/2013  ? Pure hypercholesterolemia 11/18/2021  ? ? ?Past Surgical History:  ?Procedure Laterality Date  ? CERVICAL CERCLAGE    ? CERVICAL CERCLAGE N/A 06/21/2013  ? Procedure: [place] CERCLAGE CERVICAL;  Surgeon: Marvene Staff, MD;  Location: [facility];  Service: Gynecology;  Laterality: N/A;  ? CESAREAN SECTION    ? CESAREAN SECTION N/A 12/17/2013  ? Procedure: Repeat CESAREAN SECTION with Cerclage Removal;  Surgeon: Marvene Staff, MD;  Location: [facility];  Service: Obstetrics;  Laterality: N/A;  EDD: 12/22/13  ? CHOLECYSTECTOMY    ? CHOLECYSTECTOMY OPEN  08/2011  ? 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 ? ?Current Medications: ?Current Meds  ?Medication Sig  ? acetaminophen (TYLENOL) 500 MG tablet Take 1,000 mg by mouth every 6 (six) hours as needed for moderate pain or headache.  ? albuterol (PROVENTIL HFA;VENTOLIN HFA) 108 (  90 Base) MCG/ACT inhaler Inhale 1-2 puffs into the lungs every 6 (six) hours as needed for wheezing or shortness of breath.  ? aspirin EC 81 MG tablet Take 1 tablet (81 mg total) by mouth daily. Swallow whole.  ? atorvastatin (LIPITOR) 20 MG tablet Take 1 tablet by mouth daily.  ? Bacillus Coagulans-Inulin (PROBIOTIC) 1-250 BILLION-MG CAPS Take 1 capsule by mouth daily.  ? Continuous Blood Gluc Sensor (DEXCOM G6 SENSOR) MISC Change sensor every 10 days  ? Continuous Blood Gluc Transmit (DEXCOM G6 TRANSMITTER) MISC Use as directed for 90 days  ? cyanocobalamin (,VITAMIN B-12,) 1000 MCG/ML injection Inject 1 ml (1000  mcg) subcutaneously daily for 1 week, then weekly for 1 month, then once every 2 weeks (Patient taking differently: Inject 1,000 mcg into the skin every 14 (fourteen) days.)  ? dapagliflozin propanediol (FARXIGA) 10 MG TABS tablet Take 1 tablet by mouth once a day  ? escitalopram (LEXAPRO) 20 MG tablet TAKE 2 TABLETS BY MOUTH ONCE A DAY.  ? hyoscyamine (LEVBID) 0.375 MG 12 hr tablet Take 1 tablet  by mouth as needed as directed (30 days). (Patient taking differently: Take 0.375 mg by mouth daily as needed for cramping.)  ? ibuprofen (ADVIL) 200 MG tablet Take 800 mg by mouth every 6 (six) hours as needed for moderate pain or headache.  ? inFLIXimab (REMICADE) 100 MG injection every 8 (eight) weeks.  ? Insulin Pen Needle 32G X 4 MM MISC by Does not apply route. BD U/F Pen Needles Nano 4 mm x 32 G; use with insulin  ? insulin regular human CONCENTRATED (HUMULIN R U-500 KWIKPEN) 500 UNIT/ML KwikPen Inject 5-50 Units into the skin 3 (three) times daily with meals. Take 10 units in the morning, 45 units at lunch, 25 units at dinner.  ? LORazepam (ATIVAN) 1 MG tablet Take 1/2 tablet by mouth once daily as needed (Patient taking differently: Take 0.5 mg by mouth daily as needed for anxiety.)  ? losartan (COZAAR) 50 MG tablet Take 50 mg by mouth daily.  ? medroxyPROGESTERone Acetate (DEPO-PROVERA) 150 MG/ML SUSY Inject 1 mL into the muscle every 3 months  ? MELATONIN PO Take 20 mg by mouth at bedtime as needed (sleep).  ? metoprolol succinate (TOPROL-XL) 25 MG 24 hr tablet Take 1 tablet (25 mg total) by mouth daily.  ? nystatin-triamcinolone (MYCOLOG II) cream APPLY TO THE AFFECTED AREA(S) TWO TIMES DAILY AS DIRECTED (Patient taking differently: Apply 1 application. topically 2 (two) times daily as needed (rash).)  ? pantoprazole (PROTONIX) 40 MG tablet Take 1 tablet (40 mg total) by mouth daily.  ? promethazine (PHENERGAN) 25 MG tablet TAKE 1 TABLET BY MOUTH EVERY 8 HOURS (Patient taking differently: Take 25 mg by mouth  every 8 (eight) hours as needed for vomiting or nausea.)  ? repaglinide (PRANDIN) 1 MG tablet Take 1 tablet (1 mg total) by mouth 3 (three) times daily before meals.  ? Semaglutide,0.25 or 0.5MG/DOS, (OZEMPIC, 0.25 OR 0.5 MG/DOSE,) 2 MG/3ML SOPN Inject 0.25 mg into the skin once a week for four weeks, then 0.5 mg once a week (Patient taking differently: Inject 0.5 mg into the skin every Tuesday.)  ? spironolactone (ALDACTONE) 50 MG tablet Take 1 tablet by mouth every day. (Patient taking differently: Take 50 mg by mouth daily.)  ? SYRINGE-NEEDLE, DISP, 3 ML 25G X 5/8" 3 ML MISC Use to inject B12 under the skin  ?  ? ?Allergies:   Metronidazole, Shellfish allergy, Empagliflozin-linagliptin, Lisinopril, Metformin hcl, Prednisone, Dulaglutide,  and Liraglutide  ? ?Social History  ? ?Socioeconomic History  ? Marital status: Married  ?  Spouse name: Not on file  ? Number of children: Not on file  ? Years of education: Not on file  ? Highest education level: Not on file  ?Occupational History  ? Not on file  ?Tobacco Use  ? Smoking status: Never  ? Smokeless tobacco: Never  ?Substance and Sexual Activity  ? Alcohol use: Yes  ?  Comment: occasionally  ? Drug use: No  ? Sexual activity: Not on file  ?Other Topics Concern  ? Not on file  ?Social History Narrative  ? Not on file  ? ?Social Determinants of Health  ? ?Financial Resource Strain: Low Risk   ? Difficulty of Paying Living Expenses: Not hard at all  ?Food Insecurity: No Food Insecurity  ? Worried About Charity fundraiser in the Last Year: Never true  ? Ran Out of Food in the Last Year: Never true  ?Transportation Needs: No Transportation Needs  ? Lack of Transportation (Medical): No  ? Lack of Transportation (Non-Medical): No  ?Physical Activity: Inactive  ? Days of Exercise per Week: 0 days  ? Minutes of Exercise per Session: 0 min  ?Stress: Not on file  ?Social Connections: Not on file  ?  ? ?Family History: ?The patient's family history includes Cancer in her  maternal grandmother; Diabetes in her maternal aunt and maternal grandmother; Heart attack (age of onset: 28) in her cousin; Hypercholesterolemia in her mother; Hypertension in her maternal grandfather,

## 2022-03-17 NOTE — Assessment & Plan Note (Signed)
Lipids are well controlled on atorvastatin. ?

## 2022-03-17 NOTE — Patient Instructions (Signed)
Medication Instructions:  ?Your physician recommends that you continue on your current medications as directed. Please refer to the Current Medication list given to you today.  ? ?*If you need a refill on your cardiac medications before your next appointment, please call your pharmacy* ? ?Lab Work: ?NONE ? ?Testing/Procedures: ?NONE ? ?Follow-Up: ?At [facility], you and your health needs are our priority.  As part of our continuing mission to provide you with exceptional heart care, we have created designated Provider Care Teams.  These Care Teams include your primary Cardiologist (physician) and Advanced Practice Providers (APPs -  Physician Assistants and Nurse Practitioners) who all work together to provide you with the care you need, when you need it. ? ?We recommend signing up for the patient portal called "MyChart".  Sign up information is provided on this After Visit Summary.  MyChart is used to connect with patients for Virtual Visits (Telemedicine).  Patients are able to view lab/test results, encounter notes, upcoming appointments, etc.  Non-urgent messages can be sent to your provider as well.   ?To learn more about what you can do with MyChart, go to [email or web address].   ? ?Your next appointment:   ?12 month(s) ? ?The format for your next appointment:   ?In Person ? ?Provider:   ?Skeet Latch, MD  ? ? ? ? ? ? ?

## 2022-03-21 ENCOUNTER — Other Ambulatory Visit (HOSPITAL_COMMUNITY): Payer: Self-pay

## 2022-03-22 ENCOUNTER — Other Ambulatory Visit (HOSPITAL_COMMUNITY): Payer: Self-pay

## 2022-03-22 NOTE — Progress Notes (Signed)
?Cindy Long D.O. ?[facility] ?Cindy Long ?Phone: [phone] ?Subjective:   ?I, Cindy Long, am serving as a scribe for Dr. Hulan Saas. ? ?This visit occurred during the SARS-CoV-2 public health emergency.  Safety protocols were in place, including screening questions prior to the visit, additional usage of staff PPE, and extensive cleaning of exam room while observing appropriate contact time as indicated for disinfecting solutions.  ? ? ?I'm seeing this patient by the request  of:  Plotnikov, Evie Lacks, MD ? ?CC: Bilateral knee pain ? 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  Patient does have some muscle atrophy secondary to the high blood sugars.  We did discuss the possibility of the steroid injections but secondary to patient's blood sugars being around 250 at the moment we decided it would be better to try the viscosupplementation again.  We will attempt to get approval in the near future.  Patient will follow-up at that time and we will try again and hoping that this will make more improvement. ? ?Update 03/23/2022 ?Cindy Long is a 46 y.o. female coming in with complaint of B knee pain. Durolane approved B knees. Patient states that she feels more pain with stairs. Pain occurs daily.  ? ? ? ?  ? ?Past Medical History:  ?Diagnosis Date  ? Chest pressure 11/18/2021  ? Crohn disease ([place])   ? Diabetes mellitus   ? IDDM  ? Essential hypertension 11/18/2021  ? GERD (gastroesophageal reflux disease)   ? no meds currently  ? High cholesterol   ? Hypertension   ? Mastitis   ? right breast  ? Postpartum care following cesarean delivery (2/10) 12/17/2013  ? Pure hypercholesterolemia 11/18/2021  ? ?Past Surgical History:  ?Procedure Laterality Date  ? CERVICAL CERCLAGE    ? CERVICAL CERCLAGE N/A 06/21/2013  ? Procedure: [place] CERCLAGE CERVICAL;  Surgeon: Marvene Staff, MD;  Location: [facility];  Service: Gynecology;   Laterality: N/A;  ? CESAREAN SECTION    ? CESAREAN SECTION N/A 12/17/2013  ? Procedure: Repeat CESAREAN SECTION with Cerclage Removal;  Surgeon: Marvene Staff, MD;  Location: [facility];  Service: Obstetrics;  Laterality: N/A;  EDD: 12/22/13  ? CHOLECYSTECTOMY    ? CHOLECYSTECTOMY OPEN  08/2011  ? 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 ?Social History  ? ?Socioeconomic History  ? Marital status: Married  ?  Spouse name: Not on file  ? Number of children: Not on file  ? Years of education: Not on file  ? Highest education level: Not on file  ?Occupational History  ? Not on file  ?Tobacco Use  ? Smoking status: Never  ? Smokeless tobacco: Never  ?Substance and Sexual Activity  ? Alcohol use: Yes  ?  Comment: occasionally  ? Drug use: No  ? Sexual activity: Not on file  ?Other Topics Concern  ? Not on file  ?Social History Narrative  ? Not on file  ? ?Social Determinants of Health  ? ?Financial Resource Strain: Low Risk   ? Difficulty of Paying Living Expenses: Not hard at all  ?Food Insecurity: No Food Insecurity  ? Worried About Charity fundraiser in the Last Year: Never true  ? Ran Out of Food in the Last Year: Never true  ?Transportation Needs: No Transportation Needs  ? Lack of Transportation (Medical): No  ? Lack of Transportation (Non-Medical): No  ?Physical Activity: Inactive  ? Days of Exercise per  Week: 0 days  ? Minutes of Exercise per Session: 0 min  ?Stress: Not on file  ?Social Connections: Not on file  ? ?Allergies  ?Allergen Reactions  ? Metronidazole Swelling  ?  Throat swelling  ? Shellfish Allergy Swelling  ?  Swelling of the throat  ? Empagliflozin-Linagliptin Other (See Comments)  ? Lisinopril Other (See Comments)  ? Metformin Hcl Other (See Comments)  ? Prednisone Other (See Comments)  ? Dulaglutide Rash  ? Liraglutide Rash  ? ?Family History  ?Problem Relation Age of Onset  ? Hypertension Mother   ? Hypercholesterolemia Mother   ? Hypertension Maternal Grandmother   ? Cancer Maternal  Grandmother   ?     breast, colon  ? Diabetes Maternal Grandmother   ? Hypertension Maternal Grandfather   ? Diabetes Maternal Aunt   ? Heart attack Cousin 42  ? ? ?Current Outpatient Medications (Endocrine & Metabolic):  ?  dapagliflozin propanediol (FARXIGA) 10 MG TABS tablet, Take 1 tablet by mouth once a day ?  insulin regular human CONCENTRATED (HUMULIN R U-500 KWIKPEN) 500 UNIT/ML KwikPen, Inject 5-50 Units into the skin 3 (three) times daily with meals. Take 10 units in the morning, 45 units at lunch, 25 units at dinner. ?  medroxyPROGESTERone Acetate (DEPO-PROVERA) 150 MG/ML SUSY, Inject 1 mL into the muscle every 3 months ?  repaglinide (PRANDIN) 1 MG tablet, Take 1 tablet (1 mg total) by mouth 3 (three) times daily before meals. ?  Semaglutide,0.25 or 0.5MG/DOS, (OZEMPIC, 0.25 OR 0.5 MG/DOSE,) 2 MG/3ML SOPN, Inject 0.25 mg into the skin once a week for four weeks, then 0.5 mg once a week (Patient taking differently: Inject 0.5 mg into the skin every Tuesday.) ? ?Current Outpatient Medications (Cardiovascular):  ?  atorvastatin (LIPITOR) 20 MG tablet, Take 1 tablet by mouth daily. ?  losartan (COZAAR) 50 MG tablet, Take 50 mg by mouth daily. ?  metoprolol succinate (TOPROL-XL) 25 MG 24 hr tablet, Take 1 tablet (25 mg total) by mouth daily. ?  spironolactone (ALDACTONE) 50 MG tablet, Take 1 tablet by mouth every day. (Patient taking differently: Take 50 mg by mouth daily.) ? ?Current Outpatient Medications (Respiratory):  ?  albuterol (PROVENTIL HFA;VENTOLIN HFA) 108 (90 Base) MCG/ACT inhaler, Inhale 1-2 puffs into the lungs every 6 (six) hours as needed for wheezing or shortness of breath. ?  promethazine (PHENERGAN) 25 MG tablet, TAKE 1 TABLET BY MOUTH EVERY 8 HOURS (Patient taking differently: Take 25 mg by mouth every 8 (eight) hours as needed for vomiting or nausea.) ? ?Current Outpatient Medications (Analgesics):  ?  acetaminophen (TYLENOL) 500 MG tablet, Take 1,000 mg by mouth every 6 (six) hours as  needed for moderate pain or headache. ?  aspirin EC 81 MG tablet, Take 1 tablet (81 mg total) by mouth daily. Swallow whole. ?  ibuprofen (ADVIL) 200 MG tablet, Take 800 mg by mouth every 6 (six) hours as needed for moderate pain or headache. ? ?Current Outpatient Medications (Hematological):  ?  cyanocobalamin (,VITAMIN B-12,) 1000 MCG/ML injection, Inject 1 ml subcutaneously once daily for 1 week-- then inject weekly for 1 month-- then inject once every 2 weeks ? ?Current Outpatient Medications (Other):  ?  Bacillus Coagulans-Inulin (PROBIOTIC) 1-250 BILLION-MG CAPS, Take 1 capsule by mouth daily. ?  Continuous Blood Gluc Sensor (DEXCOM G6 SENSOR) MISC, Change sensor every 10 days ?  Continuous Blood Gluc Transmit (DEXCOM G6 TRANSMITTER) MISC, Use as directed for 90 days ?  escitalopram (LEXAPRO) 20 MG tablet,  TAKE 2 TABLETS BY MOUTH ONCE A DAY. ?  hyoscyamine (LEVBID) 0.375 MG 12 hr tablet, Take 1 tablet  by mouth as needed as directed (30 days). (Patient taking differently: Take 0.375 mg by mouth daily as needed for cramping.) ?  inFLIXimab (REMICADE) 100 MG injection, every 8 (eight) weeks. ?  Insulin Pen Needle 32G X 4 MM MISC, by Does not apply route. BD U/F Pen Needles Nano 4 mm x 32 G; use with insulin ?  LORazepam (ATIVAN) 1 MG tablet, Take 1/2 tablet by mouth once daily as needed (Patient taking differently: Take 0.5 mg by mouth daily as needed for anxiety.) ?  MELATONIN PO, Take 20 mg by mouth at bedtime as needed (sleep). ?  nystatin-triamcinolone (MYCOLOG II) cream, APPLY TO THE AFFECTED AREA(S) TWO TIMES DAILY AS DIRECTED (Patient taking differently: Apply 1 application. topically 2 (two) times daily as needed (rash).) ?  pantoprazole (PROTONIX) 40 MG tablet, Take 1 tablet (40 mg total) by mouth daily. ?  SYRINGE-NEEDLE, DISP, 3 ML 25G X 5/8" 3 ML MISC, Use to inject B12 under the skin ?  ustekinumab (STELARA) 130 MG/26ML SOLN injection, Infuse 3 vials IV over at least 1 hour (Patient not taking:  Reported on 03/17/2022) ?  ustekinumab (STELARA) 90 MG/ML SOSY injection, Inject 90 mg into the skin every 8 weeks after initial IV infusion, then every 8 weeks thereafter (Patient not taking: Reported on 5/11/2

## 2022-03-23 ENCOUNTER — Ambulatory Visit: Payer: 59 | Admitting: Family Medicine

## 2022-03-23 DIAGNOSIS — M170 Bilateral primary osteoarthritis of knee: Secondary | ICD-10-CM | POA: Diagnosis not present

## 2022-03-23 DIAGNOSIS — M1710 Unilateral primary osteoarthritis, unspecified knee: Secondary | ICD-10-CM

## 2022-03-23 NOTE — Assessment & Plan Note (Signed)
Patient has responded previously.  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  Discussed which activities to do and which ones to avoid, increase activity slowly.  Discussed icing regimen and home exercises.  We will follow-up again in 6 to 8 weeks otherwise.  Can repeat these injections every 6 months if necessary. ?

## 2022-03-23 NOTE — Patient Instructions (Addendum)
Injected both knees today with Durolane ?You know the drill ?See me in 7-8 weeks just in case ?Write [place] if you need [place] prior to the 7-8 weeks ?

## 2022-03-24 DIAGNOSIS — K5010 Crohn's disease of large intestine without complications: Secondary | ICD-10-CM | POA: Diagnosis not present

## 2022-03-25 ENCOUNTER — Other Ambulatory Visit (HOSPITAL_COMMUNITY): Payer: Self-pay

## 2022-03-28 ENCOUNTER — Other Ambulatory Visit: Payer: Self-pay

## 2022-03-28 ENCOUNTER — Telehealth: Payer: Self-pay | Admitting: Pharmacy Technician

## 2022-03-28 DIAGNOSIS — K5090 Crohn's disease, unspecified, without complications: Secondary | ICD-10-CM | POA: Insufficient documentation

## 2022-03-28 DIAGNOSIS — K50919 Crohn's disease, unspecified, with unspecified complications: Secondary | ICD-10-CM

## 2022-03-28 NOTE — Telephone Encounter (Addendum)
Auth Submission: APPROVED [name] MEDICAL BENEFIT Payer: [employer] Medication & CPT/J Code(s) submitted: Stelara Infusion (Ustekinumab) [id number] Route of submission (phone, fax, portal): [employer] PORTAL Auth type: Buy/Bill Units/visits requested: 390MG X1 DOSE Reference number: [id number] Approval from: 03/28/22 to  06/27/22  Auth Submission: APPROVED STELARA SQ - PHARMACY BENEFIT Payer: [employer] Medication & CPT/J Code(s) submitted: Stelara SQ injection (Ustekinumab) J3357 Route of submission (phone, fax, portal): PORTAL Auth type: Pharmacy Benefit Units/visits requested: 45MG SYG q56 days Reference number: [id number] Approval from: 03/18/22 to 07/16/22  FYI: MD office submitted prior auth. Script can be filled at [facility] Phone: [place] co-pay card: [place] PHONE: [phone] Id: [id number] [id number] Gr: [id number]  Debit card id: [id number] Cvc: 164 Exp: 05/26  Exp: 11/06/22

## 2022-03-29 ENCOUNTER — Ambulatory Visit: Payer: 59 | Admitting: Physical Medicine and Rehabilitation

## 2022-03-29 ENCOUNTER — Encounter: Payer: Self-pay | Admitting: Physical Medicine and Rehabilitation

## 2022-03-29 DIAGNOSIS — R202 Paresthesia of skin: Secondary | ICD-10-CM | POA: Diagnosis not present

## 2022-03-29 NOTE — Progress Notes (Unsigned)
Pt state right hand pain that travels up to her shoulder. Pt state has numbness and tingling in her finger, not her thumb. Pt state it hard for her to type and open jars, to painful. Pt state she takes over the counter pain meds and uses heat and ice to help ease her pain. Pt state she right handed.  Numeric Pain Rating Scale and Functional Assessment Average Pain 2   In the last MONTH (on 0-10 scale) has pain interfered with the following?  1. General activity like being  able to carry out your everyday physical activities such as walking, climbing stairs, carrying groceries, or moving a chair?  Rating(8)    -BT, -Dye Allergies.

## 2022-03-30 ENCOUNTER — Other Ambulatory Visit (HOSPITAL_COMMUNITY): Payer: Self-pay

## 2022-03-30 NOTE — Telephone Encounter (Signed)
Patient seen by Dr. Oval Linsey on 03/17/22. Closing this encounter.

## 2022-03-31 ENCOUNTER — Inpatient Hospital Stay: Payer: 59 | Admitting: Internal Medicine

## 2022-03-31 NOTE — Procedures (Signed)
EMG & NCV Findings: Evaluation of the right median motor nerve showed prolonged distal onset latency (4.9 ms) and decreased conduction velocity (Elbow-Wrist, 44 m/s).  The right median (across palm) sensory nerve showed prolonged distal peak latency (Wrist, 4.9 ms) and prolonged distal peak latency (Palm, 15.5 ms).  The right ulnar sensory nerve showed prolonged distal peak latency (3.8 ms), reduced amplitude (10.8 V), and decreased conduction velocity (Wrist-5th Digit, 37 m/s).  All remaining nerves (as indicated in the following tables) were within normal limits.    All examined muscles (as indicated in the following table) showed no evidence of electrical instability.    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   There is no significant electrodiagnostic evidence of any other focal nerve entrapment, brachial plexopathy or cervical radiculopathy.   Recommendations: 1.  Follow-up with referring physician. 2.  Continue current management of symptoms. 3.  Suggest surgical evaluation.  ___________________________ Laurence Spates FAAPMR Board Certified, American Board of Physical Medicine and Rehabilitation    Nerve Conduction Studies Anti Sensory Summary Table   Stim Site NR Peak (ms) Norm Peak (ms) P-T Amp (V) Norm P-T Amp Site1 Site2 Delta-P (ms) Dist (cm) Vel (m/s) Norm Vel (m/s)  Right Median Acr Palm Anti Sensory (2nd Digit)  28.8C  Wrist    *4.9 <3.6 15.9 >10 Wrist Palm 10.6 0.0    Palm    *15.5 <2.0 0.1         Right Radial Anti Sensory (Base 1st Digit)  28.8C  Wrist    2.6 <3.1 9.9  Wrist Base 1st Digit 2.6 0.0    Right Ulnar Anti Sensory (5th Digit)  29.1C  Wrist    *3.8 <3.7 *10.8 >15.0 Wrist 5th Digit 3.8 14.0 *37 >38   Motor Summary Table   Stim Site NR Onset (ms) Norm Onset (ms) O-P Amp (mV) Norm O-P Amp Site1  Site2 Delta-0 (ms) Dist (cm) Vel (m/s) Norm Vel (m/s)  Right Median Motor (Abd Poll Brev)  29C  Wrist    *4.9 <4.2 6.5 >5 Elbow Wrist 4.7 20.5 *44 >50  Elbow    9.6  6.2         Right Ulnar Motor (Abd Dig Min)  29.3C  Wrist    4.1 <4.2 4.0 >3 B Elbow Wrist 3.6 19.0 53 >53  B Elbow    7.7  6.3  A Elbow B Elbow 1.6 10.0 62 >53  A Elbow    9.3  6.3          EMG   Side Muscle Nerve Root Ins Act Fibs Psw Amp Dur Poly Recrt Int Fraser Din Comment  Right Abd Poll Brev Median C8-T1 Nml Nml Nml Nml Nml 0 Nml Nml   Right 1stDorInt Ulnar C8-T1 Nml Nml Nml Nml Nml 0 Nml Nml   Right PronatorTeres Median C6-7 Nml Nml Nml Nml Nml 0 Nml Nml   Right Biceps Musculocut C5-6 Nml Nml Nml Nml Nml 0 Nml Nml   Right Deltoid Axillary C5-6 Nml Nml Nml Nml Nml 0 Nml Nml     Nerve Conduction Studies Anti Sensory Left/Right Comparison   Stim Site L Lat (ms) R Lat (ms) L-R Lat (ms) L Amp (V) R Amp (V) L-R Amp (%) Site1 Site2 L Vel (m/s) R Vel (m/s) L-R Vel (m/s)  Median Acr Palm Anti Sensory (2nd Digit)  28.8C  Wrist  *  4.9   15.9  Wrist Palm     Palm  *15.5   0.1        Radial Anti Sensory (Base 1st Digit)  28.8C  Wrist  2.6   9.9  Wrist Base 1st Digit     Ulnar Anti Sensory (5th Digit)  29.1C  Wrist  *3.8   *10.8  Wrist 5th Digit  *37    Motor Left/Right Comparison   Stim Site L Lat (ms) R Lat (ms) L-R Lat (ms) L Amp (mV) R Amp (mV) L-R Amp (%) Site1 Site2 L Vel (m/s) R Vel (m/s) L-R Vel (m/s)  Median Motor (Abd Poll Brev)  29C  Wrist  *4.9   6.5  Elbow Wrist  *44   Elbow  9.6   6.2        Ulnar Motor (Abd Dig Min)  29.3C  Wrist  4.1   4.0  B Elbow Wrist  53   B Elbow  7.7   6.3  A Elbow B Elbow  62   A Elbow  9.3   6.3           Waveforms:

## 2022-03-31 NOTE — Progress Notes (Signed)
Cindy Long - 46 y.o. female MRN [id number]  Date of birth: 20-Oct-1976  Office Visit Note: Visit Date: 03/29/2022 PCP: Cassandria Anger, MD Referred by: Sherilyn Cooter, MD  Subjective: Chief Complaint  Patient presents with   Right Shoulder - Pain, Numbness, Tingling   Right Hand - Numbness, Pain, Tingling   Right Wrist - Pain, Numbness, Tingling   HPI:  Cindy Long is a 46 y.o. female who comes in today at the request of Dr. Sherilyn Cooter for electrodiagnostic study of the Right upper extremities.  Patient is Right hand dominant.  She reports chronic worsening hand pain that travels from the hand up to the shoulder at times.  She denies any left-sided symptoms or frank radicular symptoms.  She does get some numbness and tingling in the fingers but not her thumb incidentally.  Somewhat more radial digits but can feel like her whole hand.  She reports some weakness with opening jars and moving the hand is somewhat painful when trying to opening objects and pull things.  She works as an Corporate treasurer.  She does get some nocturnal complaints.  ROS Otherwise per HPI.  Assessment & Plan: Visit Diagnoses:    ICD-10-CM   1. Paresthesia of skin  R20.2 NCV with EMG (electromyography)      Plan: Impression: The above electrodiagnostic study is ABNORMAL and reveals evidence of a moderate right median nerve entrapment at the wrist (carpal tunnel syndrome) affecting sensory and motor components.   There is no significant electrodiagnostic evidence of any other focal nerve entrapment, brachial plexopathy or cervical radiculopathy.   Recommendations: 1.  Follow-up with referring physician. 2.  Continue current management of symptoms. 3.  Suggest surgical evaluation.  Meds & Orders: No orders of the defined types were placed in this encounter.   Orders Placed This Encounter  Procedures   NCV with EMG (electromyography)    Follow-up: Return in about 2 weeks (around 04/12/2022) for  Sherilyn Cooter, MD.   Procedures: No procedures performed  EMG & NCV Findings: Evaluation of the right median motor nerve showed prolonged distal onset latency (4.9 ms) and decreased conduction velocity (Elbow-Wrist, 44 m/s).  The right median (across palm) sensory nerve showed prolonged distal peak latency (Wrist, 4.9 ms) and prolonged distal peak latency (Palm, 15.5 ms).  The right ulnar sensory nerve showed prolonged distal peak latency (3.8 ms), reduced amplitude (10.8 V), and decreased conduction velocity (Wrist-5th Digit, 37 m/s).  All remaining nerves (as indicated in the following tables) were within normal limits.    All examined muscles (as indicated in the following table) showed no evidence of electrical instability.    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   There is no significant electrodiagnostic evidence of any other focal nerve entrapment, brachial plexopathy or cervical radiculopathy.   Recommendations: 1.  Follow-up with referring physician. 2.  Continue current management of symptoms. 3.  Suggest surgical evaluation.  ___________________________ Laurence Spates FAAPMR Board Certified, American Board of Physical Medicine and Rehabilitation    Nerve Conduction Studies Anti Sensory Summary Table   Stim Site NR Peak (ms) Norm Peak (ms) P-T Amp (V) Norm P-T Amp Site1 Site2 Delta-P (ms) Dist (cm) Vel (m/s) Norm Vel (m/s)  Right Median Acr Palm Anti Sensory (2nd Digit)  28.8C  Wrist    *4.9 <3.6 15.9 >10 Wrist Palm 10.6 0.0  Palm    *15.5 <2.0 0.1         Right Radial Anti Sensory (Base 1st Digit)  28.8C  Wrist    2.6 <3.1 9.9  Wrist Base 1st Digit 2.6 0.0    Right Ulnar Anti Sensory (5th Digit)  29.1C  Wrist    *3.8 <3.7 *10.8 >15.0 Wrist 5th Digit 3.8 14.0 *37 >38   Motor Summary Table   Stim Site  NR Onset (ms) Norm Onset (ms) O-P Amp (mV) Norm O-P Amp Site1 Site2 Delta-0 (ms) Dist (cm) Vel (m/s) Norm Vel (m/s)  Right Median Motor (Abd Poll Brev)  29C  Wrist    *4.9 <4.2 6.5 >5 Elbow Wrist 4.7 20.5 *44 >50  Elbow    9.6  6.2         Right Ulnar Motor (Abd Dig Min)  29.3C  Wrist    4.1 <4.2 4.0 >3 B Elbow Wrist 3.6 19.0 53 >53  B Elbow    7.7  6.3  A Elbow B Elbow 1.6 10.0 62 >53  A Elbow    9.3  6.3          EMG   Side Muscle Nerve Root Ins Act Fibs Psw Amp Dur Poly Recrt Int Fraser Din Comment  Right Abd Poll Brev Median C8-T1 Nml Nml Nml Nml Nml 0 Nml Nml   Right 1stDorInt Ulnar C8-T1 Nml Nml Nml Nml Nml 0 Nml Nml   Right PronatorTeres Median C6-7 Nml Nml Nml Nml Nml 0 Nml Nml   Right Biceps Musculocut C5-6 Nml Nml Nml Nml Nml 0 Nml Nml   Right Deltoid Axillary C5-6 Nml Nml Nml Nml Nml 0 Nml Nml     Nerve Conduction Studies Anti Sensory Left/Right Comparison   Stim Site L Lat (ms) R Lat (ms) L-R Lat (ms) L Amp (V) R Amp (V) L-R Amp (%) Site1 Site2 L Vel (m/s) R Vel (m/s) L-R Vel (m/s)  Median Acr Palm Anti Sensory (2nd Digit)  28.8C  Wrist  *4.9   15.9  Wrist Palm     Palm  *15.5   0.1        Radial Anti Sensory (Base 1st Digit)  28.8C  Wrist  2.6   9.9  Wrist Base 1st Digit     Ulnar Anti Sensory (5th Digit)  29.1C  Wrist  *3.8   *10.8  Wrist 5th Digit  *37    Motor Left/Right Comparison   Stim Site L Lat (ms) R Lat (ms) L-R Lat (ms) L Amp (mV) R Amp (mV) L-R Amp (%) Site1 Site2 L Vel (m/s) R Vel (m/s) L-R Vel (m/s)  Median Motor (Abd Poll Brev)  29C  Wrist  *4.9   6.5  Elbow Wrist  *44   Elbow  9.6   6.2        Ulnar Motor (Abd Dig Min)  29.3C  Wrist  4.1   4.0  B Elbow Wrist  53   B Elbow  7.7   6.3  A Elbow B Elbow  62   A Elbow  9.3   6.3           Waveforms:             Clinical History: No specialty comments available.     Objective:  VS:  HT:    WT:   BMI:     BP:   HR: bpm  TEMP: ( )  RESP:  Physical Exam Musculoskeletal:  General: No swelling, tenderness or deformity.     Comments: Inspection reveals no atrophy of the bilateral APB or FDI or hand intrinsics. There is no swelling, color changes, allodynia or dystrophic changes. There is 5 out of 5 strength in the bilateral wrist extension, finger abduction and long finger flexion. There is intact sensation to light touch in all dermatomal and peripheral nerve distributions. There is a negative Phalen's test on the right. There is a negative Hoffmann's test bilaterally.  Skin:    General: Skin is warm and dry.     Findings: No erythema or rash.  Neurological:     General: No focal deficit present.     Mental Status: She is alert and oriented to person, place, and time.     Motor: No weakness or abnormal muscle tone.     Coordination: Coordination normal.  Psychiatric:        Mood and Affect: Mood normal.        Behavior: Behavior normal.     Imaging: No results found.

## 2022-04-06 ENCOUNTER — Encounter: Payer: Self-pay | Admitting: Pulmonary Disease

## 2022-04-07 ENCOUNTER — Ambulatory Visit: Payer: 59 | Admitting: Orthopedic Surgery

## 2022-04-12 ENCOUNTER — Other Ambulatory Visit (HOSPITAL_COMMUNITY): Payer: Self-pay

## 2022-04-14 ENCOUNTER — Telehealth: Payer: Self-pay | Admitting: *Deleted

## 2022-04-14 NOTE — Telephone Encounter (Signed)
Pt states I am still feeling some pressure in my chest but its not as bad as before it may be situational and too I can not sleep at night at all and melatonin is not helping.  Can I try ambien?..Cindy Long

## 2022-04-18 ENCOUNTER — Other Ambulatory Visit (HOSPITAL_COMMUNITY): Payer: Self-pay

## 2022-04-18 MED ORDER — ZOLPIDEM TARTRATE 10 MG PO TABS
5.0000 mg | ORAL_TABLET | Freq: Every evening | ORAL | 3 refills | Status: DC | PRN
Start: 2022-04-18 — End: 2022-12-19
  Filled 2022-04-18: qty 30, 30d supply, fill #0

## 2022-04-18 NOTE — Telephone Encounter (Signed)
Notified pt w/ MD response.../l,b

## 2022-04-18 NOTE — Telephone Encounter (Signed)
Pressure in the chest could have been related to the smoke from [place] in the air. Okay zolpidem.  Take the lowest dose that works.  Do not take with lorazepam. Thanks

## 2022-04-20 ENCOUNTER — Other Ambulatory Visit (HOSPITAL_COMMUNITY): Payer: Self-pay

## 2022-04-22 ENCOUNTER — Ambulatory Visit (INDEPENDENT_AMBULATORY_CARE_PROVIDER_SITE_OTHER): Payer: 59 | Admitting: *Deleted

## 2022-04-22 ENCOUNTER — Inpatient Hospital Stay (HOSPITAL_COMMUNITY): Admission: RE | Admit: 2022-04-22 | Payer: 59 | Source: Ambulatory Visit

## 2022-04-22 VITALS — BP 117/78 | HR 83 | Temp 98.5°F | Resp 18 | Ht 66.0 in | Wt 156.2 lb

## 2022-04-22 DIAGNOSIS — K50919 Crohn's disease, unspecified, with unspecified complications: Secondary | ICD-10-CM | POA: Diagnosis not present

## 2022-04-22 MED ORDER — USTEKINUMAB 130 MG/26ML IV SOLN
390.0000 mg | Freq: Once | INTRAVENOUS | Status: AC
  Administered 2022-04-22: 390 mg via INTRAVENOUS
  Filled 2022-04-22: qty 78

## 2022-04-22 NOTE — Progress Notes (Signed)
Diagnosis: Crohn's Disease  Provider:  Marshell Garfinkel, MD  Procedure: Infusion  IV Type: Peripheral, IV Location: L Antecubital  Stelera (Ustekinumab), Dose: 390 mg  Infusion Start Time: 9150 am  Infusion Stop Time: 1219 pm  Post Infusion IV Care: Observation period completed and Peripheral IV Discontinued  Discharge: Condition: Good, Destination: Home . AVS provided to patient.   Performed by:  Oren Beckmann, RN

## 2022-04-25 ENCOUNTER — Ambulatory Visit: Payer: 59 | Admitting: Orthopedic Surgery

## 2022-04-26 ENCOUNTER — Other Ambulatory Visit (HOSPITAL_COMMUNITY): Payer: Self-pay

## 2022-04-26 DIAGNOSIS — R946 Abnormal results of thyroid function studies: Secondary | ICD-10-CM | POA: Diagnosis not present

## 2022-04-26 DIAGNOSIS — I2510 Atherosclerotic heart disease of native coronary artery without angina pectoris: Secondary | ICD-10-CM | POA: Diagnosis not present

## 2022-04-26 DIAGNOSIS — E1165 Type 2 diabetes mellitus with hyperglycemia: Secondary | ICD-10-CM | POA: Diagnosis not present

## 2022-04-26 DIAGNOSIS — I10 Essential (primary) hypertension: Secondary | ICD-10-CM | POA: Diagnosis not present

## 2022-04-26 DIAGNOSIS — Z794 Long term (current) use of insulin: Secondary | ICD-10-CM | POA: Diagnosis not present

## 2022-04-26 DIAGNOSIS — Z3042 Encounter for surveillance of injectable contraceptive: Secondary | ICD-10-CM | POA: Diagnosis not present

## 2022-04-26 DIAGNOSIS — Z87898 Personal history of other specified conditions: Secondary | ICD-10-CM | POA: Diagnosis not present

## 2022-04-26 MED ORDER — DEXCOM G7 SENSOR MISC
4 refills | Status: DC
  Filled 2022-04-26 – 2022-06-10 (×2): qty 3, 30d supply, fill #0
  Filled 2022-07-20: qty 3, 30d supply, fill #1
  Filled 2022-09-26: qty 3, 30d supply, fill #2
  Filled 2022-11-19 – 2022-12-07 (×2): qty 3, 30d supply, fill #3
  Filled 2023-01-06: qty 3, 30d supply, fill #4
  Filled 2023-03-21: qty 3, 30d supply, fill #5
  Filled 2023-04-13 – 2023-04-15 (×2): qty 3, 30d supply, fill #6

## 2022-04-27 ENCOUNTER — Other Ambulatory Visit (HOSPITAL_COMMUNITY): Payer: Self-pay

## 2022-05-03 ENCOUNTER — Other Ambulatory Visit (HOSPITAL_COMMUNITY): Payer: Self-pay

## 2022-05-05 ENCOUNTER — Other Ambulatory Visit (HOSPITAL_COMMUNITY): Payer: Self-pay

## 2022-05-07 ENCOUNTER — Other Ambulatory Visit (HOSPITAL_COMMUNITY): Payer: Self-pay

## 2022-05-09 ENCOUNTER — Other Ambulatory Visit (HOSPITAL_COMMUNITY): Payer: Self-pay

## 2022-05-09 ENCOUNTER — Encounter (HOSPITAL_COMMUNITY): Payer: Self-pay | Admitting: Pharmacist

## 2022-05-16 ENCOUNTER — Other Ambulatory Visit (HOSPITAL_COMMUNITY): Payer: Self-pay

## 2022-05-18 ENCOUNTER — Ambulatory Visit: Payer: 59 | Admitting: Family Medicine

## 2022-05-19 ENCOUNTER — Encounter: Payer: Self-pay | Admitting: Internal Medicine

## 2022-05-19 ENCOUNTER — Other Ambulatory Visit (HOSPITAL_COMMUNITY): Payer: Self-pay

## 2022-05-19 ENCOUNTER — Ambulatory Visit (INDEPENDENT_AMBULATORY_CARE_PROVIDER_SITE_OTHER): Payer: 59 | Admitting: Internal Medicine

## 2022-05-19 VITALS — BP 124/76 | HR 101 | Temp 98.9°F | Ht 66.0 in | Wt 160.6 lb

## 2022-05-19 DIAGNOSIS — R1032 Left lower quadrant pain: Secondary | ICD-10-CM | POA: Insufficient documentation

## 2022-05-19 DIAGNOSIS — R1111 Vomiting without nausea: Secondary | ICD-10-CM | POA: Insufficient documentation

## 2022-05-19 DIAGNOSIS — M79644 Pain in right finger(s): Secondary | ICD-10-CM

## 2022-05-19 DIAGNOSIS — K6289 Other specified diseases of anus and rectum: Secondary | ICD-10-CM | POA: Insufficient documentation

## 2022-05-19 DIAGNOSIS — K50919 Crohn's disease, unspecified, with unspecified complications: Secondary | ICD-10-CM

## 2022-05-19 DIAGNOSIS — K602 Anal fissure, unspecified: Secondary | ICD-10-CM | POA: Insufficient documentation

## 2022-05-19 DIAGNOSIS — R1033 Periumbilical pain: Secondary | ICD-10-CM | POA: Insufficient documentation

## 2022-05-19 DIAGNOSIS — Z0000 Encounter for general adult medical examination without abnormal findings: Secondary | ICD-10-CM

## 2022-05-19 DIAGNOSIS — R110 Nausea: Secondary | ICD-10-CM | POA: Insufficient documentation

## 2022-05-19 DIAGNOSIS — D509 Iron deficiency anemia, unspecified: Secondary | ICD-10-CM | POA: Insufficient documentation

## 2022-05-19 DIAGNOSIS — R141 Gas pain: Secondary | ICD-10-CM | POA: Insufficient documentation

## 2022-05-19 DIAGNOSIS — R152 Fecal urgency: Secondary | ICD-10-CM | POA: Insufficient documentation

## 2022-05-19 MED ORDER — METOPROLOL SUCCINATE ER 25 MG PO TB24
25.0000 mg | ORAL_TABLET | Freq: Every day | ORAL | 3 refills | Status: DC
Start: 2022-05-19 — End: 2022-06-08
  Filled 2022-05-30: qty 90, 90d supply, fill #0

## 2022-05-19 NOTE — Assessment & Plan Note (Signed)
Blue-Emu cream was recommended to use 2-3 times a day Sleeve brace for thumb

## 2022-05-19 NOTE — Assessment & Plan Note (Signed)

## 2022-05-19 NOTE — Patient Instructions (Addendum)
Blue-Emu cream use 2-3 times a day Sleeve brace for thumb

## 2022-05-19 NOTE — Progress Notes (Signed)
Subjective:  Patient ID: [name], female    DOB: [birth year]  Age: 46 y.o. MRN: [id number]  CC: Annual Exam   HPI TINSLEY EVERMAN presents for a well exam Arletta is going to [facility] for a second opinion  Outpatient Medications Prior to Visit  Medication Sig Dispense Refill   acetaminophen (TYLENOL) 500 MG tablet Take 1,000 mg by mouth every 6 (six) hours as needed for moderate pain or headache.     albuterol (PROVENTIL HFA;VENTOLIN HFA) 108 (90 Base) MCG/ACT inhaler Inhale 1-2 puffs into the lungs every 6 (six) hours as needed for wheezing or shortness of breath.     aspirin EC 81 MG tablet Take 1 tablet (81 mg total) by mouth daily. Swallow whole. 90 tablet 3   atorvastatin (LIPITOR) 20 MG tablet Take 1 tablet by mouth daily. 90 tablet 3   Bacillus Coagulans-Inulin (PROBIOTIC) 1-250 BILLION-MG CAPS Take 1 capsule by mouth daily.     Continuous Blood Gluc Sensor (DEXCOM G7 SENSOR) MISC Change sensor every 10 days 9 each 4   cyanocobalamin (,VITAMIN B-12,) 1000 MCG/ML injection Inject 1 ml subcutaneously once daily for 1 week-- then inject weekly for 1 month-- then inject once every 2 weeks 10 mL 6   dapagliflozin propanediol (FARXIGA) 10 MG TABS tablet Take 1 tablet by mouth once a day 30 tablet 5   escitalopram (LEXAPRO) 20 MG tablet TAKE 2 TABLETS BY MOUTH ONCE A DAY. 180 tablet 3   hyoscyamine (LEVBID) 0.375 MG 12 hr tablet Take 1 tablet  by mouth as needed as directed (30 days). (Patient taking differently: Take 0.375 mg by mouth daily as needed for cramping.) 30 tablet 1   Insulin Pen Needle 32G X 4 MM MISC by Does not apply route. BD U/F Pen Needles Nano 4 mm x 32 G; use with insulin     insulin regular human CONCENTRATED (HUMULIN R U-500 KWIKPEN) 500 UNIT/ML KwikPen Inject 5-50 Units into the skin 3 (three) times daily with meals. Take 10 units in the morning, 45 units at lunch, 25 units at dinner. 24 mL 3   LORazepam (ATIVAN) 1 MG tablet Take 1/2 tablet by mouth once  daily as needed (Patient taking differently: Take 0.5 mg by mouth daily as needed for anxiety.) 20 tablet 1   losartan (COZAAR) 50 MG tablet Take 50 mg by mouth daily.     medroxyPROGESTERone Acetate (DEPO-PROVERA) 150 MG/ML SUSY Inject 1 mL into the muscle every 3 months 1 mL 4   metoprolol succinate (TOPROL-XL) 25 MG 24 hr tablet Take 1 tablet (25 mg total) by mouth daily. 30 tablet 1   nystatin-triamcinolone (MYCOLOG II) cream APPLY TO THE AFFECTED AREA(S) TWO TIMES DAILY AS DIRECTED (Patient taking differently: Apply 1 application  topically 2 (two) times daily as needed (rash).) 20 g 2   pantoprazole (PROTONIX) 40 MG tablet Take 1 tablet (40 mg total) by mouth daily. 30 tablet 1   promethazine (PHENERGAN) 25 MG tablet TAKE 1 TABLET BY MOUTH EVERY 8 HOURS (Patient taking differently: Take 25 mg by mouth every 8 (eight) hours as needed for vomiting or nausea.) 90 tablet 1   repaglinide (PRANDIN) 1 MG tablet Take 1 tablet (1 mg total) by mouth 3 (three) times daily before meals. 270 tablet 1   Semaglutide,0.25 or 0.5MG/DOS, (OZEMPIC, 0.25 OR 0.5 MG/DOSE,) 2 MG/3ML SOPN Inject 0.25 mg into the skin once a week for four weeks, then 0.5 mg once a week (Patient taking differently: Inject  0.5 mg into the skin every Tuesday.) 9 mL 4   spironolactone (ALDACTONE) 50 MG tablet Take 1 tablet by mouth every day. (Patient taking differently: Take 50 mg by mouth daily.) 30 tablet 11   SYRINGE-NEEDLE, DISP, 3 ML 25G X 5/8" 3 ML MISC Use to inject B12 under the skin 50 each 3   ustekinumab (STELARA) 130 MG/26ML SOLN injection Infuse 3 vials IV over at least 1 hour 78 mL 0   ustekinumab (STELARA) 90 MG/ML SOSY injection Inject 90 mg into the skin every 8 weeks after initial IV infusion, then every 8 weeks thereafter 1 mL 6   zolpidem (AMBIEN) 10 MG tablet Take 1/2-1 tablet (5-10 mg total) by mouth at bedtime as needed for sleep. 30 tablet 3   Continuous Blood Gluc Transmit (DEXCOM G6 TRANSMITTER) MISC Use as  directed for 90 days 1 each 4   Continuous Blood Gluc Sensor (DEXCOM G6 SENSOR) MISC Change sensor every 10 days 9 each 4   ibuprofen (ADVIL) 200 MG tablet Take 800 mg by mouth every 6 (six) hours as needed for moderate pain or headache.     inFLIXimab (REMICADE) 100 MG injection every 8 (eight) weeks.     No facility-administered medications prior to visit.    ROS: Review of Systems  Constitutional:  Positive for fatigue. Negative for activity change, appetite change, chills and unexpected weight change.  HENT:  Negative for congestion, mouth sores and sinus pressure.   Eyes:  Negative for visual disturbance.  Respiratory:  Negative for cough and chest tightness.   Gastrointestinal:  Positive for abdominal pain and diarrhea. Negative for nausea and rectal pain.  Genitourinary:  Negative for difficulty urinating, frequency and vaginal pain.  Musculoskeletal:  Negative for back pain and gait problem.  Skin:  Negative for pallor and rash.  Neurological:  Negative for dizziness, tremors, weakness, numbness and headaches.  Psychiatric/Behavioral:  Negative for confusion and sleep disturbance.     Objective:  BP 124/76 (BP Location: Right Arm, Patient Position: Sitting, Cuff Size: Normal)   Pulse (!) 101   Temp 98.9 F (37.2 C) (Oral)   Ht 5' 6"  (1.676 m)   Wt 160 lb 9.6 oz (72.8 kg)   SpO2 100%   BMI 25.92 kg/m   BP Readings from Last 3 Encounters:  05/19/22 124/76  04/22/22 117/78  03/23/22 102/72    Wt Readings from Last 3 Encounters:  05/19/22 160 lb 9.6 oz (72.8 kg)  04/22/22 156 lb 3.2 oz (70.9 kg)  03/23/22 154 lb (69.9 kg)    Physical Exam Constitutional:      General: She is not in acute distress.    Appearance: She is well-developed. She is obese.  HENT:     Head: Normocephalic.     Right Ear: External ear normal.     Left Ear: External ear normal.     Nose: Nose normal.  Eyes:     General:        Right eye: No discharge.        Left eye: No discharge.      Conjunctiva/sclera: Conjunctivae normal.     Pupils: Pupils are equal, round, and reactive to light.  Neck:     Thyroid: No thyromegaly.     Vascular: No JVD.     Trachea: No tracheal deviation.  Cardiovascular:     Rate and Rhythm: Normal rate and regular rhythm.     Heart sounds: Normal heart sounds.  Pulmonary:  Effort: No respiratory distress.     Breath sounds: No stridor. No wheezing.  Abdominal:     General: Bowel sounds are normal. There is no distension.     Palpations: Abdomen is soft. There is no mass.     Tenderness: There is no abdominal tenderness. There is no guarding or rebound.  Musculoskeletal:        General: No tenderness.     Cervical back: Normal range of motion and neck supple. No rigidity.  Lymphadenopathy:     Cervical: No cervical adenopathy.  Skin:    Findings: No erythema or rash.  Neurological:     Mental Status: She is oriented to person, place, and time.     Cranial Nerves: No cranial nerve deficit.     Motor: No abnormal muscle tone.     Coordination: Coordination normal.     Deep Tendon Reflexes: Reflexes normal.  Psychiatric:        Behavior: Behavior normal.        Thought Content: Thought content normal.        Judgment: Judgment normal.     Lab Results  Component Value Date   WBC 5.4 03/06/2022   HGB 12.3 03/06/2022   HCT 36.9 03/06/2022   PLT 226 03/06/2022   GLUCOSE 245 (H) 03/06/2022   CHOL 116 03/05/2022   TRIG 71 03/05/2022   HDL 34 (L) 03/05/2022   LDLCALC 68 03/05/2022   ALT 22 03/05/2022   AST 20 03/05/2022   NA 140 03/06/2022   K 4.0 03/06/2022   CL 114 (H) 03/06/2022   CREATININE 0.55 03/06/2022   BUN 7 03/06/2022   CO2 21 (L) 03/06/2022   TSH 0.048 (L) 03/05/2022   HGBA1C 10.7 (H) 03/05/2022    ECHOCARDIOGRAM COMPLETE  Result Date: 03/05/2022    ECHOCARDIOGRAM REPORT   Patient Name:   JAZMINA MUHLENKAMP Klepper Date of Exam: 03/05/2022 Medical Rec #:  [id number]          Height:       66.0 in Accession #:     [id number]         Weight:       154.6 lb Date of Birth:  03/25/1976           BSA:          1.793 m Patient Age:    79 years           BP:           114/77 mmHg Patient Gender: F                  HR:           94 bpm. Exam Location:  Inpatient Procedure: 2D Echo, Cardiac Doppler and Color Doppler Indications:    Elevated troponin  History:        Patient has prior history of Echocardiogram examinations, most                 recent 08/09/2018.  Sonographer:    Merrie Roof RDCS Referring Phys: [place]  1. No defined RWMA. Left ventricular ejection fraction, by estimation, is 55%. The left ventricle has normal function. The left ventricle has no regional wall motion abnormalities. Left ventricular diastolic parameters were normal.  2. Right ventricular systolic function is normal. The right ventricular size is normal.  3. The mitral valve is normal in structure. No evidence of mitral valve regurgitation. No evidence of mitral stenosis.  4. The aortic valve is normal in structure. Aortic valve regurgitation is not visualized. No aortic stenosis is present.  5. The inferior vena cava is normal in size with greater than 50% respiratory variability, suggesting right atrial pressure of 3 mmHg. FINDINGS  Left Ventricle: No defined RWMA. Left ventricular ejection fraction, by estimation, is 55%. The left ventricle has normal function. The left ventricle has no regional wall motion abnormalities. The left ventricular internal cavity size was normal in size. There is no left ventricular hypertrophy. Left ventricular diastolic parameters were normal. Right Ventricle: The right ventricular size is normal. No increase in right ventricular wall thickness. Right ventricular systolic function is normal. Left Atrium: Left atrial size was normal in size. Right Atrium: Right atrial size was normal in size. Pericardium: There is no evidence of pericardial effusion. Mitral Valve: The mitral valve is normal in  structure. No evidence of mitral valve regurgitation. No evidence of mitral valve stenosis. Tricuspid Valve: The tricuspid valve is normal in structure. Tricuspid valve regurgitation is not demonstrated. No evidence of tricuspid stenosis. Aortic Valve: The aortic valve is normal in structure. Aortic valve regurgitation is not visualized. No aortic stenosis is present. Aortic valve mean gradient measures 2.0 mmHg. Aortic valve peak gradient measures 4.1 mmHg. Aortic valve area, by VTI measures 2.94 cm. Pulmonic Valve: The pulmonic valve was normal in structure. Pulmonic valve regurgitation is not visualized. No evidence of pulmonic stenosis. Aorta: The aortic root is normal in size and structure. Venous: The inferior vena cava is normal in size with greater than 50% respiratory variability, suggesting right atrial pressure of 3 mmHg. IAS/Shunts: No atrial level shunt detected by color flow Doppler.  LEFT VENTRICLE PLAX 2D LVIDd:         3.80 cm   Diastology LVIDs:         2.60 cm   LV e' medial:    8.73 cm/s LV PW:         1.10 cm   LV E/e' medial:  8.0 LV IVS:        1.10 cm   LV e' lateral:   14.70 cm/s LVOT diam:     2.10 cm   LV E/e' lateral: 4.7 LV SV:         51 LV SV Index:   28 LVOT Area:     3.46 cm  RIGHT VENTRICLE RV Basal diam:  3.00 cm RV S prime:     14.30 cm/s TAPSE (M-mode): 1.9 cm LEFT ATRIUM             Index        RIGHT ATRIUM           Index LA diam:        3.30 cm 1.84 cm/m   RA Area:     14.50 cm LA Vol (A2C):   43.2 ml 24.10 ml/m  RA Volume:   31.90 ml  17.80 ml/m LA Vol (A4C):   52.0 ml 29.01 ml/m LA Biplane Vol: 51.2 ml 28.56 ml/m  AORTIC VALVE AV Area (Vmax):    2.63 cm AV Area (Vmean):   2.72 cm AV Area (VTI):     2.94 cm AV Vmax:           101.00 cm/s AV Vmean:          69.900 cm/s AV VTI:            0.173 m AV Peak Grad:      4.1 mmHg AV  Mean Grad:      2.0 mmHg LVOT Vmax:         76.80 cm/s LVOT Vmean:        54.800 cm/s LVOT VTI:          0.147 m LVOT/AV VTI ratio: 0.85   AORTA Ao Root diam: 3.20 cm Ao Asc diam:  3.20 cm MITRAL VALVE MV Area (PHT): 4.49 cm    SHUNTS MV Decel Time: 169 msec    Systemic VTI:  0.15 m MV E velocity: 69.80 cm/s  Systemic Diam: 2.10 cm MV A velocity: 88.60 cm/s MV E/A ratio:  0.79 Jenkins Rouge MD Electronically signed by Jenkins Rouge MD Signature Date/Time: 03/05/2022/5:07:43 PM    Final    CT Angio Chest PE W and/or Wo Contrast  Result Date: 03/05/2022 CLINICAL DATA:  Chest pain radiates down left arm. Dizziness. PE suspected. EXAM: CT ANGIOGRAPHY CHEST WITH CONTRAST TECHNIQUE: Multidetector CT imaging of the chest was performed using the standard protocol during bolus administration of intravenous contrast. Multiplanar CT image reconstructions and MIPs were obtained to evaluate the vascular anatomy. RADIATION DOSE REDUCTION: This exam was performed according to the departmental dose-optimization program which includes automated exposure control, adjustment of the mA and/or kV according to patient size and/or use of iterative reconstruction technique. CONTRAST:  79m OMNIPAQUE IOHEXOL 350 MG/ML SOLN COMPARISON:  11/29/2021 CT heart.  CTA chest 07/23/2017. FINDINGS: Cardiovascular: The heart size is normal. No substantial pericardial effusion. No thoracic aortic aneurysm. No substantial atherosclerosis of the thoracic aorta. There is no filling defect within the opacified pulmonary arteries to suggest the presence of an acute pulmonary embolus. Mediastinum/Nodes: No mediastinal lymphadenopathy. There is no hilar lymphadenopathy. The esophagus has normal imaging features. There is no axillary lymphadenopathy. Lungs/Pleura: No suspicious pulmonary nodule or mass. No focal airspace consolidation. No pleural effusion. Upper Abdomen: Unremarkable. Musculoskeletal: No worrisome lytic or sclerotic osseous abnormality. Review of the MIP images confirms the above findings. IMPRESSION: 1. No CT evidence for acute pulmonary embolus. 2. No acute findings in the  chest. Electronically Signed   By: EMisty StanleyM.D.   On: 03/05/2022 04:59   DG Chest 1 View  Result Date: 03/04/2022 CLINICAL DATA:  Chest pain, dizziness EXAM: CHEST  1 VIEW COMPARISON:  07/23/2017 FINDINGS: Normal heart size, mediastinal contours, and pulmonary vascularity. Lungs clear. No pleural effusion or pneumothorax. Bones unremarkable. IMPRESSION: No acute abnormalities. Electronically Signed   By: MLavonia DanaM.D.   On: 03/04/2022 19:20    Assessment & Plan:   Problem List Items Addressed This Visit     Crohn disease ([place]    [place] [facility] [place] [facility] [place]  I encouraged her to try gluten-free diet for 6 weeks.       Thumb pain, right    Blue-Emu cream was recommended to use 2-3 times a day Sleeve brace for thumb      Well adult exam - Primary    We discussed age appropriate health related issues, including available/recomended screening tests and vaccinations. Labs were ordered to be later reviewed . All questions were answered. We discussed one or more of the following - seat belt use, use of sunscreen/sun exposure exercise, fall risk reduction, second hand smoke exposure, firearm use and storage, seat belt use, a need for adhering to healthy diet and exercise. Labs were ordered.  All questions were answered.         Meds ordered this encounter  Medications   metoprolol  succinate (TOPROL-XL) 25 MG 24 hr tablet    Sig: Take 1 tablet (25 mg total) by mouth daily.    Dispense:  90 tablet    Refill:  3      Follow-up: Return in about 6 months (around 11/19/2022) for a follow-up visit.  Walker Kehr, MD

## 2022-05-21 ENCOUNTER — Other Ambulatory Visit (HOSPITAL_COMMUNITY): Payer: Self-pay

## 2022-05-25 ENCOUNTER — Other Ambulatory Visit (HOSPITAL_COMMUNITY): Payer: Self-pay

## 2022-05-25 ENCOUNTER — Ambulatory Visit (HOSPITAL_BASED_OUTPATIENT_CLINIC_OR_DEPARTMENT_OTHER): Payer: 59 | Admitting: Cardiovascular Disease

## 2022-05-25 DIAGNOSIS — Z309 Encounter for contraceptive management, unspecified: Secondary | ICD-10-CM | POA: Diagnosis not present

## 2022-05-25 DIAGNOSIS — Z3042 Encounter for surveillance of injectable contraceptive: Secondary | ICD-10-CM | POA: Diagnosis not present

## 2022-05-25 DIAGNOSIS — B3731 Acute candidiasis of vulva and vagina: Secondary | ICD-10-CM | POA: Diagnosis not present

## 2022-05-25 DIAGNOSIS — Z01419 Encounter for gynecological examination (general) (routine) without abnormal findings: Secondary | ICD-10-CM | POA: Diagnosis not present

## 2022-05-25 DIAGNOSIS — E119 Type 2 diabetes mellitus without complications: Secondary | ICD-10-CM | POA: Diagnosis not present

## 2022-05-25 MED ORDER — MEDROXYPROGESTERONE ACETATE 150 MG/ML IM SUSY
PREFILLED_SYRINGE | INTRAMUSCULAR | 11 refills | Status: AC
  Filled 2022-05-25 – 2022-07-12 (×2): qty 1, 84d supply, fill #0
  Filled 2022-09-26: qty 1, 84d supply, fill #1
  Filled 2022-12-22: qty 1, 84d supply, fill #2
  Filled 2023-03-23 (×2): qty 1, 84d supply, fill #3

## 2022-05-25 MED ORDER — FLUCONAZOLE 150 MG PO TABS
ORAL_TABLET | ORAL | 1 refills | Status: DC
  Filled 2022-05-25: qty 1, 1d supply, fill #0

## 2022-05-26 ENCOUNTER — Other Ambulatory Visit (HOSPITAL_COMMUNITY): Payer: Self-pay

## 2022-05-27 ENCOUNTER — Other Ambulatory Visit (HOSPITAL_COMMUNITY): Payer: Self-pay

## 2022-05-27 MED ORDER — HUMULIN R U-500 KWIKPEN 500 UNIT/ML ~~LOC~~ SOPN
PEN_INJECTOR | SUBCUTANEOUS | 3 refills | Status: DC
  Filled 2022-05-27: qty 6, 42d supply, fill #0
  Filled 2022-09-20: qty 6, 42d supply, fill #1
  Filled 2022-11-19: qty 6, 42d supply, fill #2
  Filled 2023-01-06 – 2023-02-13 (×4): qty 6, 42d supply, fill #3

## 2022-05-30 ENCOUNTER — Other Ambulatory Visit (HOSPITAL_BASED_OUTPATIENT_CLINIC_OR_DEPARTMENT_OTHER): Payer: Self-pay

## 2022-05-30 ENCOUNTER — Other Ambulatory Visit (HOSPITAL_COMMUNITY): Payer: Self-pay

## 2022-05-31 ENCOUNTER — Encounter: Payer: Self-pay | Admitting: Pulmonary Disease

## 2022-05-31 ENCOUNTER — Other Ambulatory Visit (HOSPITAL_COMMUNITY): Payer: Self-pay

## 2022-06-02 ENCOUNTER — Other Ambulatory Visit (HOSPITAL_COMMUNITY): Payer: Self-pay

## 2022-06-03 ENCOUNTER — Encounter: Payer: Self-pay | Admitting: Pulmonary Disease

## 2022-06-03 ENCOUNTER — Other Ambulatory Visit (HOSPITAL_COMMUNITY): Payer: Self-pay

## 2022-06-06 NOTE — Assessment & Plan Note (Signed)
Cindy Long is going to [facility] for a second opinion.  I encouraged her to try gluten-free diet for 6 weeks.

## 2022-06-08 ENCOUNTER — Encounter: Payer: Self-pay | Admitting: Internal Medicine

## 2022-06-08 ENCOUNTER — Ambulatory Visit: Payer: 59 | Admitting: Internal Medicine

## 2022-06-08 ENCOUNTER — Other Ambulatory Visit (HOSPITAL_COMMUNITY): Payer: Self-pay

## 2022-06-08 VITALS — HR 135 | Temp 98.3°F | Ht 66.0 in | Wt 154.0 lb

## 2022-06-08 DIAGNOSIS — I471 Supraventricular tachycardia: Secondary | ICD-10-CM

## 2022-06-08 DIAGNOSIS — R079 Chest pain, unspecified: Secondary | ICD-10-CM

## 2022-06-08 DIAGNOSIS — R110 Nausea: Secondary | ICD-10-CM

## 2022-06-08 LAB — COMPREHENSIVE METABOLIC PANEL
ALT: 22 U/L (ref 0–35)
AST: 14 U/L (ref 0–37)
Albumin: 4.5 g/dL (ref 3.5–5.2)
Alkaline Phosphatase: 86 U/L (ref 39–117)
BUN: 11 mg/dL (ref 6–23)
CO2: 27 mEq/L (ref 19–32)
Calcium: 10.4 mg/dL (ref 8.4–10.5)
Chloride: 102 mEq/L (ref 96–112)
Creatinine, Ser: 0.76 mg/dL (ref 0.40–1.20)
GFR: 95.37 mL/min (ref >60.00)
Glucose, Bld: 193 mg/dL — ABNORMAL HIGH (ref 70–99)
Potassium: 4 mEq/L (ref 3.5–5.1)
Sodium: 138 mEq/L (ref 135–145)
Total Bilirubin: 0.8 mg/dL (ref 0.2–1.2)
Total Protein: 8.2 g/dL (ref 6.0–8.3)

## 2022-06-08 LAB — CBC WITH DIFFERENTIAL/PLATELET
Basophils Absolute: 0 10*3/uL (ref 0.0–0.1)
Basophils Relative: 0.2 % (ref 0.0–3.0)
Eosinophils Absolute: 0.1 10*3/uL (ref 0.0–0.7)
Eosinophils Relative: 0.5 % (ref 0.0–5.0)
HCT: 45.5 % (ref 36.0–46.0)
Hemoglobin: 15 g/dL (ref 12.0–15.0)
Lymphocytes Relative: 25.7 % (ref 12.0–46.0)
Lymphs Abs: 2.6 10*3/uL (ref 0.7–4.0)
MCHC: 32.9 g/dL (ref 30.0–36.0)
MCV: 81.1 fl (ref 78.0–100.0)
Monocytes Absolute: 0.5 10*3/uL (ref 0.1–1.0)
Monocytes Relative: 5 % (ref 3.0–12.0)
Neutro Abs: 6.9 10*3/uL (ref 1.4–7.7)
Neutrophils Relative %: 68.6 % (ref 43.0–77.0)
Platelets: 266 10*3/uL (ref 150.0–400.0)
RBC: 5.61 Mil/uL — ABNORMAL HIGH (ref 3.87–5.11)
RDW: 14 % (ref 11.5–15.5)
WBC: 10 10*3/uL (ref 4.0–10.5)

## 2022-06-08 LAB — MAGNESIUM: Magnesium: 2.3 mg/dL (ref 1.5–2.5)

## 2022-06-08 MED ORDER — METOPROLOL SUCCINATE ER 25 MG PO TB24
25.0000 mg | ORAL_TABLET | Freq: Two times a day (BID) | ORAL | 3 refills | Status: DC
  Filled 2022-06-08 – 2022-08-21 (×2): qty 180, 90d supply, fill #0
  Filled 2022-11-19: qty 180, 90d supply, fill #1

## 2022-06-08 NOTE — Assessment & Plan Note (Addendum)
New.  Increase Toprol XL to bid See Dr Oval Linsey in a follow-up Take a few days off.  RTC on Mon if okay

## 2022-06-08 NOTE — Progress Notes (Signed)
Subjective:  Patient ID: Cindy Long, female    DOB: 05/30/1976  Age: 46 y.o. MRN: [id number]  CC: No chief complaint on file.   HPI [employer] presents for CP - pressure off and on since weekend, HR would go up to 135, would feel anxious, nauseated  Outpatient Medications Prior to Visit  Medication Sig Dispense Refill   acetaminophen (TYLENOL) 500 MG tablet Take 1,000 mg by mouth every 6 (six) hours as needed for moderate pain or headache.     albuterol (PROVENTIL HFA;VENTOLIN HFA) 108 (90 Base) MCG/ACT inhaler Inhale 1-2 puffs into the lungs every 6 (six) hours as needed for wheezing or shortness of breath.     aspirin EC 81 MG tablet Take 1 tablet (81 mg total) by mouth daily. Swallow whole. 90 tablet 3   atorvastatin (LIPITOR) 20 MG tablet Take 1 tablet by mouth daily. 90 tablet 3   Bacillus Coagulans-Inulin (PROBIOTIC) 1-250 BILLION-MG CAPS Take 1 capsule by mouth daily.     Continuous Blood Gluc Sensor (DEXCOM G7 SENSOR) MISC Change sensor every 10 days 9 each 4   cyanocobalamin (,VITAMIN B-12,) 1000 MCG/ML injection Inject 1 ml subcutaneously once daily for 1 week-- then inject weekly for 1 month-- then inject once every 2 weeks 10 mL 6   dapagliflozin propanediol (FARXIGA) 10 MG TABS tablet Take 1 tablet by mouth once a day 30 tablet 5   escitalopram (LEXAPRO) 20 MG tablet TAKE 2 TABLETS BY MOUTH ONCE A DAY. 180 tablet 3   fluconazole (DIFLUCAN) 150 MG tablet Take 1 tablet by mouth as directed 1 tablet 1   hyoscyamine (LEVBID) 0.375 MG 12 hr tablet Take 1 tablet  by mouth as needed as directed (30 days). (Patient taking differently: Take 0.375 mg by mouth daily as needed for cramping.) 30 tablet 1   Insulin Pen Needle 32G X 4 MM MISC by Does not apply route. BD U/F Pen Needles Nano 4 mm x 32 G; use with insulin     insulin regular human CONCENTRATED (HUMULIN R U-500 KWIKPEN) 500 UNIT/ML KwikPen Inject 5-50 Units into the skin 3 (three) times daily with meals. Take 10  units in the morning, 45 units at lunch, 25 units at dinner. 24 mL 3   insulin regular human CONCENTRATED (HUMULIN R U-500 KWIKPEN) 500 UNIT/ML KwikPen Inject 10 units into the skin at breakfast, 40 units at lunch, 20 units at evening meal 24 mL 3   LORazepam (ATIVAN) 1 MG tablet Take 1/2 tablet by mouth once daily as needed (Patient taking differently: Take 0.5 mg by mouth daily as needed for anxiety.) 20 tablet 1   losartan (COZAAR) 50 MG tablet Take 50 mg by mouth daily.     medroxyPROGESTERone Acetate 150 MG/ML SUSY Inject 1 syringe into the muscle every 11-12 weeks 1 mL 11   nystatin-triamcinolone (MYCOLOG II) cream APPLY TO THE AFFECTED AREA(S) TWO TIMES DAILY AS DIRECTED (Patient taking differently: Apply 1 application  topically 2 (two) times daily as needed (rash).) 20 g 2   pantoprazole (PROTONIX) 40 MG tablet Take 1 tablet (40 mg total) by mouth daily. 30 tablet 1   promethazine (PHENERGAN) 25 MG tablet TAKE 1 TABLET BY MOUTH EVERY 8 HOURS (Patient taking differently: Take 25 mg by mouth every 8 (eight) hours as needed for vomiting or nausea.) 90 tablet 1   repaglinide (PRANDIN) 1 MG tablet Take 1 tablet (1 mg total) by mouth 3 (three) times daily before meals. 270 tablet 1  Semaglutide,0.25 or 0.5MG/DOS, (OZEMPIC, 0.25 OR 0.5 MG/DOSE,) 2 MG/3ML SOPN Inject 0.25 mg into the skin once a week for four weeks, then 0.5 mg once a week (Patient taking differently: Inject 0.5 mg into the skin every Tuesday.) 9 mL 4   spironolactone (ALDACTONE) 50 MG tablet Take 1 tablet by mouth every day. (Patient taking differently: Take 50 mg by mouth daily.) 30 tablet 11   SYRINGE-NEEDLE, DISP, 3 ML 25G X 5/8" 3 ML MISC Use to inject B12 under the skin 50 each 3   ustekinumab (STELARA) 130 MG/26ML SOLN injection Infuse 3 vials IV over at least 1 hour 78 mL 0   ustekinumab (STELARA) 90 MG/ML SOSY injection Inject 90 mg into the skin every 8 weeks after initial IV infusion, then every 8 weeks thereafter 1 mL 6    zolpidem (AMBIEN) 10 MG tablet Take 1/2-1 tablet (5-10 mg total) by mouth at bedtime as needed for sleep. 30 tablet 3   medroxyPROGESTERone Acetate (DEPO-PROVERA) 150 MG/ML SUSY Inject 1 mL into the muscle every 3 months 1 mL 4   metoprolol succinate (TOPROL-XL) 25 MG 24 hr tablet Take 1 tablet (25 mg total) by mouth daily. 30 tablet 1   metoprolol succinate (TOPROL-XL) 25 MG 24 hr tablet Take 1 tablet (25 mg total) by mouth daily. 90 tablet 3   No facility-administered medications prior to visit.    ROS: Review of Systems  Constitutional:  Negative for activity change, appetite change, chills, fatigue and unexpected weight change.  HENT:  Negative for congestion, mouth sores and sinus pressure.   Eyes:  Negative for visual disturbance.  Respiratory:  Negative for cough and chest tightness.   Cardiovascular:  Positive for palpitations.  Gastrointestinal:  Negative for abdominal pain and nausea.  Genitourinary:  Negative for difficulty urinating, frequency and vaginal pain.  Musculoskeletal:  Negative for back pain and gait problem.  Skin:  Negative for pallor and rash.  Neurological:  Negative for dizziness, tremors, weakness, numbness and headaches.  Psychiatric/Behavioral:  Negative for confusion and sleep disturbance.     Objective:  Pulse (!) 135   Temp 98.3 F (36.8 C) (Oral)   Ht 5' 6"  (1.676 m)   Wt 154 lb (69.9 kg)   SpO2 99%   BMI 24.86 kg/m   BP Readings from Last 3 Encounters:  05/19/22 124/76  04/22/22 117/78  03/23/22 102/72    Wt Readings from Last 3 Encounters:  06/08/22 154 lb (69.9 kg)  05/19/22 160 lb 9.6 oz (72.8 kg)  04/22/22 156 lb 3.2 oz (70.9 kg)    Physical Exam Constitutional:      General: She is not in acute distress.    Appearance: She is well-developed.  HENT:     Head: Normocephalic.     Right Ear: External ear normal.     Left Ear: External ear normal.     Nose: Nose normal.  Eyes:     General:        Right eye: No discharge.         Left eye: No discharge.     Conjunctiva/sclera: Conjunctivae normal.     Pupils: Pupils are equal, round, and reactive to light.  Neck:     Thyroid: No thyromegaly.     Vascular: No JVD.     Trachea: No tracheal deviation.  Cardiovascular:     Rate and Rhythm: Normal rate and regular rhythm.     Heart sounds: Normal heart sounds.  Pulmonary:  Effort: No respiratory distress.     Breath sounds: No stridor. No wheezing.  Abdominal:     General: Bowel sounds are normal. There is no distension.     Palpations: Abdomen is soft. There is no mass.     Tenderness: There is no abdominal tenderness. There is no guarding or rebound.  Musculoskeletal:        General: No tenderness.     Cervical back: Normal range of motion and neck supple. No rigidity.  Lymphadenopathy:     Cervical: No cervical adenopathy.  Skin:    Findings: No erythema or rash.  Neurological:     Cranial Nerves: No cranial nerve deficit.     Motor: No abnormal muscle tone.     Coordination: Coordination normal.     Deep Tendon Reflexes: Reflexes normal.  Psychiatric:        Behavior: Behavior normal.        Thought Content: Thought content normal.        Judgment: Judgment normal.      Procedure: EKG Indication: chest pain/SVT sx's Impression: S tachy. No acute changes.  Lab Results  Component Value Date   WBC 10.0 06/08/2022   HGB 15.0 06/08/2022   HCT 45.5 06/08/2022   PLT 266.0 06/08/2022   GLUCOSE 193 (H) 06/08/2022   CHOL 116 03/05/2022   TRIG 71 03/05/2022   HDL 34 (L) 03/05/2022   LDLCALC 68 03/05/2022   ALT 22 06/08/2022   AST 14 06/08/2022   NA 138 06/08/2022   K 4.0 06/08/2022   CL 102 06/08/2022   CREATININE 0.76 06/08/2022   BUN 11 06/08/2022   CO2 27 06/08/2022   TSH 0.048 (L) 03/05/2022   HGBA1C 10.7 (H) 03/05/2022    ECHOCARDIOGRAM COMPLETE  Result Date: 03/05/2022    ECHOCARDIOGRAM REPORT   Patient Name:   PECOLIA MARANDO Berdan Date of Exam: 03/05/2022 Medical Rec #:   [id number]          Height:       66.0 in Accession #:    [id number]         Weight:       154.6 lb Date of Birth:  November 26, 1975           BSA:          1.793 m Patient Age:    42 years           BP:           114/77 mmHg Patient Gender: F                  HR:           94 bpm. Exam Location:  Inpatient Procedure: 2D Echo, Cardiac Doppler and Color Doppler Indications:    Elevated troponin  History:        Patient has prior history of Echocardiogram examinations, most                 recent 08/09/2018.  Sonographer:    Merrie Roof RDCS Referring Phys: [place]  1. No defined RWMA. Left ventricular ejection fraction, by estimation, is 55%. The left ventricle has normal function. The left ventricle has no regional wall motion abnormalities. Left ventricular diastolic parameters were normal.  2. Right ventricular systolic function is normal. The right ventricular size is normal.  3. The mitral valve is normal in structure. No evidence of mitral valve regurgitation. No evidence of mitral stenosis.  4.  The aortic valve is normal in structure. Aortic valve regurgitation is not visualized. No aortic stenosis is present.  5. The inferior vena cava is normal in size with greater than 50% respiratory variability, suggesting right atrial pressure of 3 mmHg. FINDINGS  Left Ventricle: No defined RWMA. Left ventricular ejection fraction, by estimation, is 55%. The left ventricle has normal function. The left ventricle has no regional wall motion abnormalities. The left ventricular internal cavity size was normal in size. There is no left ventricular hypertrophy. Left ventricular diastolic parameters were normal. Right Ventricle: The right ventricular size is normal. No increase in right ventricular wall thickness. Right ventricular systolic function is normal. Left Atrium: Left atrial size was normal in size. Right Atrium: Right atrial size was normal in size. Pericardium: There is no evidence of pericardial  effusion. Mitral Valve: The mitral valve is normal in structure. No evidence of mitral valve regurgitation. No evidence of mitral valve stenosis. Tricuspid Valve: The tricuspid valve is normal in structure. Tricuspid valve regurgitation is not demonstrated. No evidence of tricuspid stenosis. Aortic Valve: The aortic valve is normal in structure. Aortic valve regurgitation is not visualized. No aortic stenosis is present. Aortic valve mean gradient measures 2.0 mmHg. Aortic valve peak gradient measures 4.1 mmHg. Aortic valve area, by VTI measures 2.94 cm. Pulmonic Valve: The pulmonic valve was normal in structure. Pulmonic valve regurgitation is not visualized. No evidence of pulmonic stenosis. Aorta: The aortic root is normal in size and structure. Venous: The inferior vena cava is normal in size with greater than 50% respiratory variability, suggesting right atrial pressure of 3 mmHg. IAS/Shunts: No atrial level shunt detected by color Long Doppler.  LEFT VENTRICLE PLAX 2D LVIDd:         3.80 cm   Diastology LVIDs:         2.60 cm   LV e' medial:    8.73 cm/s LV PW:         1.10 cm   LV E/e' medial:  8.0 LV IVS:        1.10 cm   LV e' lateral:   14.70 cm/s LVOT diam:     2.10 cm   LV E/e' lateral: 4.7 LV SV:         51 LV SV Index:   28 LVOT Area:     3.46 cm  RIGHT VENTRICLE RV Basal diam:  3.00 cm RV S prime:     14.30 cm/s TAPSE (M-mode): 1.9 cm LEFT ATRIUM             Index        RIGHT ATRIUM           Index LA diam:        3.30 cm 1.84 cm/m   RA Area:     14.50 cm LA Vol (A2C):   43.2 ml 24.10 ml/m  RA Volume:   31.90 ml  17.80 ml/m LA Vol (A4C):   52.0 ml 29.01 ml/m LA Biplane Vol: 51.2 ml 28.56 ml/m  AORTIC VALVE AV Area (Vmax):    2.63 cm AV Area (Vmean):   2.72 cm AV Area (VTI):     2.94 cm AV Vmax:           101.00 cm/s AV Vmean:          69.900 cm/s AV VTI:            0.173 m AV Peak Grad:      4.1 mmHg AV Mean  Grad:      2.0 mmHg LVOT Vmax:         76.80 cm/s LVOT Vmean:        54.800 cm/s  LVOT VTI:          0.147 m LVOT/AV VTI ratio: 0.85  AORTA Ao Root diam: 3.20 cm Ao Asc diam:  3.20 cm MITRAL VALVE MV Area (PHT): 4.49 cm    SHUNTS MV Decel Time: 169 msec    Systemic VTI:  0.15 m MV E velocity: 69.80 cm/s  Systemic Diam: 2.10 cm MV A velocity: 88.60 cm/s MV E/A ratio:  0.79 Jenkins Rouge MD Electronically signed by Jenkins Rouge MD Signature Date/Time: 03/05/2022/5:07:43 PM    Final    CT Angio Chest PE W and/or Wo Contrast  Result Date: 03/05/2022 CLINICAL DATA:  Chest pain radiates down left arm. Dizziness. PE suspected. EXAM: CT ANGIOGRAPHY CHEST WITH CONTRAST TECHNIQUE: Multidetector CT imaging of the chest was performed using the standard protocol during bolus administration of intravenous contrast. Multiplanar CT image reconstructions and MIPs were obtained to evaluate the vascular anatomy. RADIATION DOSE REDUCTION: This exam was performed according to the departmental dose-optimization program which includes automated exposure control, adjustment of the mA and/or kV according to patient size and/or use of iterative reconstruction technique. CONTRAST:  41m OMNIPAQUE IOHEXOL 350 MG/ML SOLN COMPARISON:  11/29/2021 CT heart.  CTA chest 07/23/2017. FINDINGS: Cardiovascular: The heart size is normal. No substantial pericardial effusion. No thoracic aortic aneurysm. No substantial atherosclerosis of the thoracic aorta. There is no filling defect within the opacified pulmonary arteries to suggest the presence of an acute pulmonary embolus. Mediastinum/Nodes: No mediastinal lymphadenopathy. There is no hilar lymphadenopathy. The esophagus has normal imaging features. There is no axillary lymphadenopathy. Lungs/Pleura: No suspicious pulmonary nodule or mass. No focal airspace consolidation. No pleural effusion. Upper Abdomen: Unremarkable. Musculoskeletal: No worrisome lytic or sclerotic osseous abnormality. Review of the MIP images confirms the above findings. IMPRESSION: 1. No CT evidence for  acute pulmonary embolus. 2. No acute findings in the chest. Electronically Signed   By: EMisty StanleyM.D.   On: 03/05/2022 04:59   DG Chest 1 View  Result Date: 03/04/2022 CLINICAL DATA:  Chest pain, dizziness EXAM: CHEST  1 VIEW COMPARISON:  07/23/2017 FINDINGS: Normal heart size, mediastinal contours, and pulmonary vascularity. Lungs clear. No pleural effusion or pneumothorax. Bones unremarkable. IMPRESSION: No acute abnormalities. Electronically Signed   By: MLavonia DanaM.D.   On: 03/04/2022 19:20    Assessment & Plan:   Problem List Items Addressed This Visit     Chest pain - Primary   Relevant Orders   EKG 12-Lead   CBC with Differential/Platelet (Completed)   Comprehensive metabolic panel (Completed)   Magnesium (Completed)   Nausea    Do COVID test at home today      PSVT (paroxysmal supraventricular tachycardia) (HCC)    New.  Increase Toprol XL to bid See Dr ROval Linseyin a follow-up Take a few days off.  RTC on Mon if okay      Relevant Medications   metoprolol succinate (TOPROL-XL) 25 MG 24 hr tablet   Other Relevant Orders   CBC with Differential/Platelet (Completed)   Comprehensive metabolic panel (Completed)   Magnesium (Completed)      Meds ordered this encounter  Medications   metoprolol succinate (TOPROL-XL) 25 MG 24 hr tablet    Sig: Take 1 tablet (25 mg total) by mouth 2 (two) times daily.    Dispense:  180 tablet    Refill:  3      Follow-up: Return in about 3 months (around 09/08/2022) for a follow-up visit.  Walker Kehr, MD

## 2022-06-09 ENCOUNTER — Other Ambulatory Visit (HOSPITAL_COMMUNITY): Payer: Self-pay

## 2022-06-09 ENCOUNTER — Encounter: Payer: Self-pay | Admitting: Pulmonary Disease

## 2022-06-10 ENCOUNTER — Other Ambulatory Visit (HOSPITAL_COMMUNITY): Payer: Self-pay

## 2022-06-13 NOTE — Assessment & Plan Note (Signed)
Do COVID test at home today

## 2022-06-14 ENCOUNTER — Telehealth: Payer: Self-pay | Admitting: *Deleted

## 2022-06-14 DIAGNOSIS — D1801 Hemangioma of skin and subcutaneous tissue: Secondary | ICD-10-CM | POA: Diagnosis not present

## 2022-06-14 DIAGNOSIS — L821 Other seborrheic keratosis: Secondary | ICD-10-CM | POA: Diagnosis not present

## 2022-06-14 NOTE — Telephone Encounter (Signed)
Call pt we received FMLA forms from [employer] called pt to inform that the forms are blank. Ask pt what dates and the reason for out of work. She states 06/08/22 (left early), 06/09/22, 06/10/22, and 06/13/22. Its for sinus tachycardia, fatigue, and stress. Place in MD purple folder for signature.Marland KitchenJohny Chess

## 2022-06-15 NOTE — Telephone Encounter (Signed)
OK. Thx

## 2022-06-15 NOTE — Telephone Encounter (Signed)
Notified pt FMLA ready for pick-up and faxed copy to [employer].Marland KitchenChryl Heck

## 2022-06-17 ENCOUNTER — Ambulatory Visit (HOSPITAL_COMMUNITY): Payer: 59

## 2022-06-27 ENCOUNTER — Other Ambulatory Visit (HOSPITAL_COMMUNITY): Payer: Self-pay

## 2022-06-28 ENCOUNTER — Other Ambulatory Visit: Payer: Self-pay

## 2022-06-28 ENCOUNTER — Encounter (HOSPITAL_COMMUNITY): Payer: Self-pay | Admitting: Emergency Medicine

## 2022-06-28 ENCOUNTER — Emergency Department (HOSPITAL_COMMUNITY): Payer: 59

## 2022-06-28 ENCOUNTER — Emergency Department (HOSPITAL_COMMUNITY)
Admission: EM | Admit: 2022-06-28 | Discharge: 2022-06-29 | Disposition: A | Discharge status: Home / Self Care | Payer: 59 | Attending: Emergency Medicine | Admitting: Emergency Medicine

## 2022-06-28 DIAGNOSIS — R112 Nausea with vomiting, unspecified: Secondary | ICD-10-CM | POA: Diagnosis not present

## 2022-06-28 DIAGNOSIS — Z7982 Long term (current) use of aspirin: Secondary | ICD-10-CM | POA: Diagnosis not present

## 2022-06-28 DIAGNOSIS — R1084 Generalized abdominal pain: Secondary | ICD-10-CM | POA: Insufficient documentation

## 2022-06-28 DIAGNOSIS — R197 Diarrhea, unspecified: Secondary | ICD-10-CM | POA: Insufficient documentation

## 2022-06-28 DIAGNOSIS — I10 Essential (primary) hypertension: Secondary | ICD-10-CM | POA: Diagnosis not present

## 2022-06-28 DIAGNOSIS — Z79899 Other long term (current) drug therapy: Secondary | ICD-10-CM | POA: Insufficient documentation

## 2022-06-28 DIAGNOSIS — I700 Atherosclerosis of aorta: Secondary | ICD-10-CM | POA: Diagnosis not present

## 2022-06-28 DIAGNOSIS — E119 Type 2 diabetes mellitus without complications: Secondary | ICD-10-CM | POA: Diagnosis not present

## 2022-06-28 DIAGNOSIS — Z7984 Long term (current) use of oral hypoglycemic drugs: Secondary | ICD-10-CM | POA: Insufficient documentation

## 2022-06-28 DIAGNOSIS — Z794 Long term (current) use of insulin: Secondary | ICD-10-CM | POA: Insufficient documentation

## 2022-06-28 DIAGNOSIS — R1032 Left lower quadrant pain: Secondary | ICD-10-CM | POA: Diagnosis not present

## 2022-06-28 LAB — COMPREHENSIVE METABOLIC PANEL
ALT: 19 U/L (ref 0–44)
AST: 12 U/L — ABNORMAL LOW (ref 15–41)
Albumin: 4 g/dL (ref 3.5–5.0)
Alkaline Phosphatase: 83 U/L (ref 38–126)
Anion gap: 9 (ref 5–15)
BUN: 13 mg/dL (ref 6–20)
CO2: 19 mmol/L — ABNORMAL LOW (ref 22–32)
Calcium: 9.4 mg/dL (ref 8.9–10.3)
Chloride: 107 mmol/L (ref 98–111)
Creatinine, Ser: 0.72 mg/dL (ref 0.44–1.00)
GFR, Estimated: >60 mL/min (ref >60)
Glucose, Bld: 184 mg/dL — ABNORMAL HIGH (ref 70–99)
Potassium: 4.1 mmol/L (ref 3.5–5.1)
Sodium: 135 mmol/L (ref 135–145)
Total Bilirubin: 1.2 mg/dL (ref 0.3–1.2)
Total Protein: 8.5 g/dL — ABNORMAL HIGH (ref 6.5–8.1)

## 2022-06-28 LAB — URINALYSIS, ROUTINE W REFLEX MICROSCOPIC
Bilirubin Urine: NEGATIVE
Glucose, UA: 150 mg/dL — AB
Hgb urine dipstick: NEGATIVE
Ketones, ur: NEGATIVE mg/dL
Leukocytes,Ua: NEGATIVE
Nitrite: NEGATIVE
Protein, ur: NEGATIVE mg/dL
Specific Gravity, Urine: 1.018 (ref 1.005–1.030)
pH: 5 (ref 5.0–8.0)

## 2022-06-28 LAB — CBC
HCT: 48.3 % — ABNORMAL HIGH (ref 36.0–46.0)
Hemoglobin: 16 g/dL — ABNORMAL HIGH (ref 12.0–15.0)
MCH: 26.9 pg (ref 26.0–34.0)
MCHC: 33.1 g/dL (ref 30.0–36.0)
MCV: 81.2 fL (ref 80.0–100.0)
Platelets: 324 10*3/uL (ref 150–400)
RBC: 5.95 MIL/uL — ABNORMAL HIGH (ref 3.87–5.11)
RDW: 13.4 % (ref 11.5–15.5)
WBC: 7.7 10*3/uL (ref 4.0–10.5)
nRBC: 0 % (ref 0.0–0.2)

## 2022-06-28 LAB — LIPASE, BLOOD: Lipase: 46 U/L (ref 11–51)

## 2022-06-28 LAB — PREGNANCY, URINE: Preg Test, Ur: NEGATIVE

## 2022-06-28 MED ORDER — IOHEXOL 300 MG/ML  SOLN
100.0000 mL | Freq: Once | INTRAMUSCULAR | Status: AC | PRN
  Administered 2022-06-28: 100 mL via INTRAVENOUS

## 2022-06-28 MED ORDER — SODIUM CHLORIDE 0.9 % IV BOLUS
1000.0000 mL | Freq: Once | INTRAVENOUS | Status: AC
  Administered 2022-06-28: 1000 mL via INTRAVENOUS

## 2022-06-28 NOTE — ED Triage Notes (Signed)
C/o NVD that started this morning. Denies being around anyone sick. Pt denies any fevers.

## 2022-06-28 NOTE — ED Provider Notes (Signed)
[facility] EMERGENCY DEPARTMENT Provider Note   CSN: [id number] Arrival date & time: 06/28/22  1932     History {Add pertinent medical, surgical, social history, OB history to HPI:1} Chief Complaint  Patient presents with   Abdominal Pain    Cindy Long is a 46 y.o. female with history of Crohn's disease (on Stelara, not on any steroids), hypertension, diabetes mellitus, GERD.  Presents emerged department complaint of nausea, vomiting, diarrhea, and generalized abdominal pain.  Patient reports that her symptoms started earlier today.  Patient had 1 episode of vomiting earlier this morning.  Emesis was described as stomach contents.  Patient reports that she has had multiple rounds of diarrhea throughout the day.  Diarrhea appear to stop at approximately 4 PM.  Patient reports that she has had intermittent abdominal cramping throughout the entire day.  Patient denies any pain at present.  Patient also endorses chills.  Patient denies any recent antibiotic use, international travel, or camping.  No known sick contacts     Abdominal Pain Associated symptoms: diarrhea, nausea and vomiting   Associated symptoms: no chest pain, no chills, no constipation, no dysuria, no fever, no hematuria, no shortness of breath, no vaginal bleeding and no vaginal discharge        Home Medications Prior to Admission medications   Medication Sig Start Date End Date Taking? Authorizing Provider  acetaminophen (TYLENOL) 500 MG tablet Take 1,000 mg by mouth every 6 (six) hours as needed for moderate pain or headache.    [provider]  albuterol (PROVENTIL HFA;VENTOLIN HFA) 108 (90 Base) MCG/ACT inhaler Inhale 1-2 puffs into the lungs every 6 (six) hours as needed for wheezing or shortness of breath.    [provider]  aspirin EC 81 MG tablet Take 1 tablet (81 mg total) by mouth daily. Swallow whole. 01/17/22   Marylu Lund., NP  atorvastatin (LIPITOR) 20 MG tablet Take 1  tablet by mouth daily. 01/17/22 01/12/23  Marylu Lund., NP  Bacillus Coagulans-Inulin (PROBIOTIC) 1-250 BILLION-MG CAPS Take 1 capsule by mouth daily.    [provider]  Continuous Blood Gluc Sensor (DEXCOM G7 SENSOR) MISC Change sensor every 10 days 04/26/22     cyanocobalamin (VITAMIN B12) 1000 MCG/ML injection Inject 1 ml subcutaneously once daily for 1 week-- then inject weekly for 1 month-- then inject once every 2 weeks 12/01/21   Plotnikov, Evie Lacks, MD  dapagliflozin propanediol (FARXIGA) 10 MG TABS tablet Take 1 tablet by mouth once a day 07/13/21     escitalopram (LEXAPRO) 20 MG tablet TAKE 2 TABLETS BY MOUTH ONCE A DAY. 11/25/21     fluconazole (DIFLUCAN) 150 MG tablet Take 1 tablet by mouth as directed 05/25/22     hyoscyamine (LEVBID) 0.375 MG 12 hr tablet Take 1 tablet  by mouth as needed as directed (30 days). Patient taking differently: Take 0.375 mg by mouth daily as needed for cramping. 11/25/21   Ronnette Juniper, MD  Insulin Pen Needle 32G X 4 MM MISC by Does not apply route. BD U/F Pen Needles Nano 4 mm x 32 G; use with insulin    [provider]  insulin regular human CONCENTRATED (HUMULIN R U-500 KWIKPEN) 500 UNIT/ML KwikPen Inject 5-50 Units into the skin 3 (three) times daily with meals. Take 10 units in the morning, 45 units at lunch, 25 units at dinner. 03/06/22   Eugenie Filler, MD  insulin regular human CONCENTRATED (HUMULIN R U-500 KWIKPEN) 500 UNIT/ML KwikPen  Inject 10 units into the skin at breakfast, 40 units at lunch, 20 units at evening meal 05/27/22     LORazepam (ATIVAN) 1 MG tablet Take 1/2 tablet by mouth once daily as needed Patient taking differently: Take 0.5 mg by mouth daily as needed for anxiety. 11/25/21     losartan (COZAAR) 50 MG tablet Take 50 mg by mouth daily. 10/18/19   [provider]  medroxyPROGESTERone Acetate 150 MG/ML SUSY Inject 1 syringe into the muscle every 11-12 weeks 05/25/22     metoprolol succinate (TOPROL-XL) 25 MG  24 hr tablet Take 1 tablet (25 mg total) by mouth 2 (two) times daily. 06/08/22   Plotnikov, Evie Lacks, MD  nystatin-triamcinolone (MYCOLOG II) cream APPLY TO THE AFFECTED AREA(S) TWO TIMES DAILY AS DIRECTED Patient taking differently: Apply 1 application  topically 2 (two) times daily as needed (rash). 09/25/21     pantoprazole (PROTONIX) 40 MG tablet Take 1 tablet (40 mg total) by mouth daily. 03/06/22   Eugenie Filler, MD  promethazine (PHENERGAN) 25 MG tablet TAKE 1 TABLET BY MOUTH EVERY 8 HOURS Patient taking differently: Take 25 mg by mouth every 8 (eight) hours as needed for vomiting or nausea. 11/25/21     repaglinide (PRANDIN) 1 MG tablet Take 1 tablet (1 mg total) by mouth 3 (three) times daily before meals. 12/19/21   Plotnikov, Evie Lacks, MD  Semaglutide,0.25 or 0.5MG/DOS, (OZEMPIC, 0.25 OR 0.5 MG/DOSE,) 2 MG/3ML SOPN Inject 0.25 mg into the skin once a week for four weeks, then 0.5 mg once a week Patient taking differently: Inject 0.5 mg into the skin every Tuesday. 01/18/22     spironolactone (ALDACTONE) 50 MG tablet Take 1 tablet by mouth every day. Patient taking differently: Take 50 mg by mouth daily. 11/30/21     SYRINGE-NEEDLE, DISP, 3 ML 25G X 5/8" 3 ML MISC Use to inject B12 under the skin 12/01/21   Plotnikov, Evie Lacks, MD  ustekinumab (STELARA) 130 MG/26ML SOLN injection Infuse 3 vials IV over at least 1 hour 03/16/22   Tresa Garter, MD  ustekinumab (STELARA) 90 MG/ML SOSY injection Inject 90 mg into the skin every 8 weeks after initial IV infusion, then every 8 weeks thereafter 03/16/22   Tresa Garter, MD  zolpidem (AMBIEN) 10 MG tablet Take 1/2-1 tablet (5-10 mg total) by mouth at bedtime as needed for sleep. 04/18/22   Plotnikov, Evie Lacks, MD      Allergies    Metronidazole, Shellfish allergy, Empagliflozin-linagliptin, Lisinopril, Metformin hcl, Prednisone, Dulaglutide, and Liraglutide    Review of Systems   Review of Systems  Constitutional:  Negative for  chills and fever.  Eyes:  Negative for visual disturbance.  Respiratory:  Negative for shortness of breath.   Cardiovascular:  Negative for chest pain.  Gastrointestinal:  Positive for abdominal pain, diarrhea, nausea and vomiting. Negative for abdominal distention, anal bleeding, blood in stool, constipation and rectal pain.  Genitourinary:  Negative for decreased urine volume, difficulty urinating, dysuria, flank pain, frequency, genital sores, hematuria, vaginal bleeding, vaginal discharge and vaginal pain.  Musculoskeletal:  Negative for back pain and neck pain.  Skin:  Negative for color change and rash.  Neurological:  Negative for dizziness, syncope, light-headedness and headaches.  Psychiatric/Behavioral:  Negative for confusion.     Physical Exam Updated Vital Signs BP 111/80   Pulse 100   Temp 99 F (37.2 C) (Oral)   Resp 17   Ht 5' 6"  (1.676 m)   Wt 68  kg   SpO2 97%   BMI 24.21 kg/m  Physical Exam Vitals and nursing note reviewed.  Constitutional:      General: She is not in acute distress.    Appearance: She is not ill-appearing, toxic-appearing or diaphoretic.  HENT:     Head: Normocephalic.  Eyes:     General: No scleral icterus.       Right eye: No discharge.        Left eye: No discharge.  Cardiovascular:     Rate and Rhythm: Normal rate.  Pulmonary:     Effort: Pulmonary effort is normal.  Abdominal:     General: Abdomen is flat. Bowel sounds are normal. There is no distension. There are no signs of injury.     Palpations: Abdomen is soft. There is no mass or pulsatile mass.     Tenderness: There is abdominal tenderness in the left lower quadrant. There is no right CVA tenderness, left CVA tenderness, guarding or rebound.     Hernia: There is no hernia in the umbilical area or ventral area.  Skin:    General: Skin is warm and dry.  Neurological:     General: No focal deficit present.     Mental Status: She is alert and oriented to person, place, and  time.     GCS: GCS eye subscore is 4. GCS verbal subscore is 5. GCS motor subscore is 6.  Psychiatric:        Behavior: Behavior is cooperative.     ED Results / Procedures / Treatments   Labs (all labs ordered are listed, but only abnormal results are displayed) Labs Reviewed  COMPREHENSIVE METABOLIC PANEL - Abnormal; Notable for the following components:      Result Value   CO2 19 (*)    Glucose, Bld 184 (*)    Total Protein 8.5 (*)    AST 12 (*)    All other components within normal limits  CBC - Abnormal; Notable for the following components:   RBC 5.95 (*)    Hemoglobin 16.0 (*)    HCT 48.3 (*)    All other components within normal limits  URINALYSIS, ROUTINE W REFLEX MICROSCOPIC - Abnormal; Notable for the following components:   APPearance HAZY (*)    Glucose, UA 150 (*)    All other components within normal limits  LIPASE, BLOOD  PREGNANCY, URINE    EKG None  Radiology CT ABDOMEN PELVIS W CONTRAST  Result Date: 06/28/2022 CLINICAL DATA:  Left lower quadrant abdominal pain EXAM: CT ABDOMEN AND PELVIS WITH CONTRAST TECHNIQUE: Multidetector CT imaging of the abdomen and pelvis was performed using the standard protocol following bolus administration of intravenous contrast. RADIATION DOSE REDUCTION: This exam was performed according to the departmental dose-optimization program which includes automated exposure control, adjustment of the mA and/or kV according to patient size and/or use of iterative reconstruction technique. CONTRAST:  156m OMNIPAQUE IOHEXOL 300 MG/ML  SOLN COMPARISON:  12/25/2021 FINDINGS: Lower chest: No acute abnormality. Hepatobiliary: No focal liver abnormality is seen. Status post cholecystectomy. No biliary dilatation. Pancreas: Unremarkable Spleen: Unremarkable Adrenals/Urinary Tract: Adrenal glands are unremarkable. Kidneys are normal, without renal calculi, focal lesion, or hydronephrosis. The bladder is decompressed. There is persistent mild  asymmetric bladder wall thickening noted anteriorly, however. This may relate to an underlying chronic infectious or inflammatory cystitis, but is nonspecific. No intraluminal stones are identified. Stomach/Bowel: Moderate descending colonic diverticulosis without superimposed acute inflammatory change. The stomach, small bowel, and large bowel are  otherwise unremarkable. Appendix normal. No free intraperitoneal gas or fluid. Vascular/Lymphatic: Mild atherosclerotic calcification within the left iliac vasculature. No aortic aneurysm. No pathologic adenopathy within the abdomen and pelvis. Reproductive: Uterus and bilateral adnexa are unremarkable. Other: No abdominal wall hernia Musculoskeletal: No acute bone abnormality IMPRESSION: 1. No acute intra-abdominal pathology identified. No definite radiographic explanation for the patient's reported symptoms. 2. Moderate descending colonic diverticulosis without superimposed acute inflammatory change. 3. Mild asymmetric bladder wall thickening, possibly related to an underlying chronic infectious or inflammatory cystitis. Correlation with urinalysis and urine culture may be helpful. Aortic Atherosclerosis (ICD10-I70.0). Electronically Signed   By: Fidela Salisbury M.D.   On: 06/28/2022 23:28    Procedures Procedures  {Document cardiac monitor, telemetry assessment procedure when appropriate:1}  Medications Ordered in ED Medications  sodium chloride 0.9 % bolus 1,000 mL (1,000 mLs Intravenous New Bag/Given 06/28/22 2235)  iohexol (OMNIPAQUE) 300 MG/ML solution 100 mL (100 mLs Intravenous Contrast Given 06/28/22 2305)    ED Course/ Medical Decision Making/ A&P                           Medical Decision Making Amount and/or Complexity of Data Reviewed Labs: ordered. Radiology: ordered.  Risk Prescription drug management.   Alert 46 year old female in no acute distress, nontoxic-appearing.  Presents to the ED with a chief complaint of nausea, vomiting,  diarrhea, and abdominal pain.  Information was obtained from patient.  I reviewed patient's past medical records including previous prior notes, labs, and imaging.  Patient has medical history as outlined in HPI which complicates her care.  Due to reports of left lower quadrant pain, nausea, vomiting, diarrhea in the setting of Crohn's disease concern for possible Crohn's complication versus acute diverticulitis.  Will obtain CMP, CBC, lipase, UA, urine pregnancy test and CT abdomen pelvis with contrast for further evaluation.  Due to patient's nausea, vomiting, diarrhea we will give fluid bolus due to concern for possible dehydration.  I personally viewed interpret patient's lab results.  Pertinent findings include: -CBC shows hemoconcentration, no leukocytosis -BMP unremarkable -Lipase within normal limits -UA shows no signs of infection  I personally viewed and interpreted patient CT imaging.  Agree with radiology interpretation of: -No acute intra-abdominal pathology identified.  No definite radiographic explanation of patient's reported symptoms.  Moderate descending colonic diverticulosis without acute inflammatory change.  Mild asymmetric bladder wall thickening possibly related to an underlying chronic infectious or inflammatory cystitis.  Low suspicion for acute cystitis as patient's urinalysis does not show any signs of infection.  Due to patient's abdominal pain nausea, vomiting, and diarrhea in the setting of Crohn's disease we will consult [facility] gastroenterology for further evaluation.  Patient reports that she is followed by Dr. Deno Etienne.  {Document critical care time when appropriate:1} {Document review of labs and clinical decision tools ie heart score, Chads2Vasc2 etc:1}  {Document your independent review of radiology images, and any outside records:1} {Document your discussion with family members, caretakers, and with consultants:1} {Document social determinants of health  affecting pt's care:1} {Document your decision making why or why not admission, treatments were needed:1} Final Clinical Impression(s) / ED Diagnoses Final diagnoses:  None    Rx / DC Orders ED Discharge Orders     None

## 2022-06-28 NOTE — ED Notes (Signed)
Pt aware of urine sample ; denies needing to void at this time

## 2022-06-28 NOTE — ED Notes (Signed)
Ambulatory to restroom

## 2022-06-29 DIAGNOSIS — I10 Essential (primary) hypertension: Secondary | ICD-10-CM | POA: Diagnosis not present

## 2022-06-29 DIAGNOSIS — E119 Type 2 diabetes mellitus without complications: Secondary | ICD-10-CM | POA: Diagnosis not present

## 2022-06-29 DIAGNOSIS — Z794 Long term (current) use of insulin: Secondary | ICD-10-CM | POA: Diagnosis not present

## 2022-06-29 DIAGNOSIS — R1084 Generalized abdominal pain: Secondary | ICD-10-CM | POA: Diagnosis not present

## 2022-06-29 DIAGNOSIS — Z7984 Long term (current) use of oral hypoglycemic drugs: Secondary | ICD-10-CM | POA: Diagnosis not present

## 2022-06-29 DIAGNOSIS — R197 Diarrhea, unspecified: Secondary | ICD-10-CM | POA: Diagnosis not present

## 2022-06-29 DIAGNOSIS — Z7982 Long term (current) use of aspirin: Secondary | ICD-10-CM | POA: Diagnosis not present

## 2022-06-29 DIAGNOSIS — Z79899 Other long term (current) drug therapy: Secondary | ICD-10-CM | POA: Diagnosis not present

## 2022-06-29 DIAGNOSIS — R112 Nausea with vomiting, unspecified: Secondary | ICD-10-CM | POA: Diagnosis not present

## 2022-06-29 NOTE — Discharge Instructions (Signed)
You came to the emergency department today to be evaluated for your nausea, vomiting, and diarrhea.  Your lab results and CT imaging were reassuring.  Please follow-up with your gastroenterologist for further management of your symptoms.  Please follow paperwork outlining food choices to help relieve your diarrhea.  Get help right away if: You have pain in your chest, neck, arm, or jaw. You feel extremely weak or you faint. You have persistent vomiting. You have vomit that is bright red or looks like black coffee grounds. You have bloody or black stools (feces) or stools that look like tar. You have a severe headache, a stiff neck, or both. You have severe pain, cramping, or bloating in your abdomen. You have difficulty breathing, or you are breathing very quickly. Your heart is beating very quickly. Your skin feels cold and clammy. You feel confused. You have signs of dehydration, such as: Dark urine, very little urine, or no urine. Cracked lips. Dry mouth. Sunken eyes. Sleepiness. Weakness.

## 2022-07-02 DIAGNOSIS — Z1231 Encounter for screening mammogram for malignant neoplasm of breast: Secondary | ICD-10-CM | POA: Diagnosis not present

## 2022-07-02 LAB — HM DIABETES EYE EXAM

## 2022-07-02 LAB — HM MAMMOGRAPHY

## 2022-07-08 ENCOUNTER — Encounter: Payer: Self-pay | Admitting: Internal Medicine

## 2022-07-12 ENCOUNTER — Other Ambulatory Visit (HOSPITAL_COMMUNITY): Payer: Self-pay

## 2022-07-12 DIAGNOSIS — Z3042 Encounter for surveillance of injectable contraceptive: Secondary | ICD-10-CM | POA: Diagnosis not present

## 2022-07-13 ENCOUNTER — Encounter: Payer: Self-pay | Admitting: Internal Medicine

## 2022-07-14 ENCOUNTER — Encounter: Payer: Self-pay | Admitting: Internal Medicine

## 2022-07-15 DIAGNOSIS — Z79899 Other long term (current) drug therapy: Secondary | ICD-10-CM | POA: Diagnosis not present

## 2022-07-15 DIAGNOSIS — K50119 Crohn's disease of large intestine with unspecified complications: Secondary | ICD-10-CM | POA: Diagnosis not present

## 2022-07-15 NOTE — Progress Notes (Signed)
Office Visit Note  Patient: Cindy Long             Date of Birth: 17-Feb-1976           MRN: [id number]             PCP: Cassandria Anger, MD Referring: Ronnette Juniper, MD Visit Date: 07/29/2022 Occupation: @GUAROCC @  Subjective:  Pain in multiple joints  History of Present Illness: Cindy Long is a 46 y.o. female seen in consultation per request of Dr. Therisa Doyne.  According to the patient her symptoms a started at age 59 with abdominal pain diarrhea and constipation.  Patient stated at that time she had colonoscopy by Dr. Collene Mares who diagnosed her with Crohn's disease.  She was given prednisone which caused tachycardia and prednisone was discontinued soon.  She was on Imuran but had no response to Imuran.  She started seeing Dr. Deno Etienne who started her on Humira.  Patient states she took Humira for 1 year and had no response to Humira.  She was switched to Remicade in July 2022.  She was on IV Remicade until April 2023 which worked well for her but the insurance coverage is stopped.  She was given Stelara infusion followed by Stelara subcu injections but her symptoms got worse.  She has continued to have diarrhea constipation and vomiting.  She was sent to [facility] for second opinion.  She was told that she is having an adequate response to Stelara.  They recommended going back to IV Remicade.  Patient has an appointment with Dr. Deno Etienne to discuss treatment plan. She states that she has been experiencing pain in her knee joints since the onset of Crohn's disease.  She had fallen twice in the last 2 years.  She also gives history of morning stiffness especially in her hands and her shoulders.  She was evaluated by Dr. Tempie Donning who did nerve conduction velocities and diagnosed her with right carpal tunnel syndrome and advised surgery.  She has had neck a stiffness over the years.  She also gives history of pain and discomfort in her right shoulder right hand knee joints and her feet.  She has had  viscosupplement injections in her knee joints by Dr. Tamala Julian in the past.  She also had Planter fasciitis about 2 years ago and was seen by Dr. Cannon Kettle.  She had no recurrence of Planter fasciitis.  She denies any history of Achilles tendinitis or uveitis.  There is no history of shortness of breath.  There is no family history of autoimmune disease.  She is gravida 2, para 2, miscarriages 0.  There is no history of DVTs.    ADLs : morning stiffness 5 minutes Patient Denies nocturnal pain.  Difficulty dressing/grooming: Denies Difficulty climbing stairs: Reports Difficulty getting out of chair: Reports Difficulty using hands for taps, buttons, cutlery, and/or writing: Reports  Review of Systems  Constitutional:  Positive for fatigue.  HENT:  Positive for mouth sores. Negative for mouth dryness.   Eyes:  Positive for dryness.  Respiratory:  Positive for shortness of breath.   Cardiovascular:  Positive for chest pain and palpitations.  Gastrointestinal:  Positive for constipation and diarrhea. Negative for blood in stool.  Endocrine: Positive for increased urination.  Genitourinary:  Positive for involuntary urination.  Musculoskeletal:  Positive for joint pain, joint pain, myalgias, morning stiffness, muscle tenderness and myalgias. Negative for gait problem, joint swelling and muscle weakness.  Skin:  Positive for hair loss. Negative for color  change, rash and sensitivity to sunlight.  Allergic/Immunologic: Positive for susceptible to infections.  Neurological:  Positive for dizziness and headaches.  Hematological:  Negative for swollen glands.  Psychiatric/Behavioral:  Positive for depressed mood. Negative for sleep disturbance. The patient is nervous/anxious.     PMFS History:  Patient Active Problem List   Diagnosis Date Noted   PSVT (paroxysmal supraventricular tachycardia) ([place]) 06/08/2022   Anal fissure 05/19/2022   Fecal urgency 05/19/2022   Flatulence, eructation and gas pain  05/19/2022   Iron deficiency anemia 05/19/2022   Left lower quadrant pain 05/19/2022   Nausea 17/49/4496   Periumbilical pain 75/91/6384   Rectal pain 05/19/2022   Vomiting without nausea 05/19/2022   Thumb pain, right 05/19/2022   Well adult exam 05/19/2022   Crohn disease ([place]) 03/28/2022   Chest pain 03/05/2022   Dehydration 03/05/2022   Elevated troponin    Pain of left upper extremity 03/04/2022   Tachycardia 03/04/2022   Diarrhea 03/04/2022   Acute nonintractable headache 03/04/2022   Numbness and tingling in right hand 02/24/2022   Coronary artery disease 02/23/2022   Asthmatic bronchitis with acute exacerbation 02/23/2022   Upper respiratory infection 02/23/2022   Adjustment insomnia 12/22/2021   Other specified anxiety disorders 12/22/2021   Crohn's disease of small and large intestines ([place]) 12/22/2021   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   Family history of breast cancer 12/22/2021   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   Gastroesophageal reflux disease 12/22/2021   Hyperglycemia due to type 2 diabetes mellitus ([place]) 12/22/2021   Intra-abdominal and pelvic swelling, mass and lump, unspecified site 12/22/2021   Long term (current) use of insulin ([place]) 12/22/2021   Mild intermittent asthma with (acute) exacerbation 12/22/2021   Pain in thoracic spine 12/22/2021   Rectal bleeding 12/22/2021   Right ankle sprain 12/22/2021   Crohn's disease ([place]) 12/13/2021   Vitamin D deficiency 12/13/2021   B12 deficiency 12/13/2021   Depression 12/13/2021   Chest pressure 11/18/2021   Palpitations 11/18/2021   Essential hypertension 11/18/2021   Pure hypercholesterolemia 11/18/2021   Patellofemoral arthritis 10/21/2021   Patellofemoral disorder, right 07/16/2021   Muscle atrophy 07/16/2021   Postpartum care following cesarean delivery (2/10) 12/17/2013   Indication for care in labor or delivery 11/30/2013   Preexisting diabetes complicating pregnancy in second trimester,  antepartum 08/18/2013   IBS (irritable bowel syndrome) 02/05/2013   Dyslipidemia 02/05/2013   Abnormal uterine bleeding 06/18/2012   Pelvic pain 06/18/2012   Type II diabetes mellitus ([place]) 06/18/2012    Past Medical History:  Diagnosis Date   Chest pressure 11/18/2021   Crohn disease ([place])    Diabetes mellitus    IDDM   Essential hypertension 11/18/2021   GERD (gastroesophageal reflux disease)    no meds currently   High cholesterol    Hypertension    Mastitis    right breast   Postpartum care following cesarean delivery (2/10) 12/17/2013   Pure hypercholesterolemia 11/18/2021    Family History  Problem Relation Age of Onset   Hypertension Mother    Hypercholesterolemia Mother    Other Sister        brain tumor   Diabetes Maternal Aunt    Hypertension Maternal Grandmother    Cancer Maternal Grandmother        breast, colon   Diabetes Maternal Grandmother    Hypertension Maternal Grandfather    Heart attack Cousin 58   Healthy Daughter    Healthy Daughter    Past Surgical History:  Procedure  Laterality Date   CERVICAL CERCLAGE     CERVICAL CERCLAGE N/A 06/21/2013   Procedure: [place] CERCLAGE CERVICAL;  Surgeon: Marvene Staff, MD;  Location: [facility];  Service: Gynecology;  Laterality: N/A;   CESAREAN SECTION  2008   CESAREAN SECTION N/A 12/17/2013   Procedure: Repeat CESAREAN SECTION with Cerclage Removal;  Surgeon: Marvene Staff, MD;  Location: [facility];  Service: Obstetrics;  Laterality: N/A;  EDD: 12/22/13   CHOLECYSTECTOMY     CHOLECYSTECTOMY OPEN  08/08/2011   GANGLION CYST EXCISION Right 1997   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   Social History   Social History Narrative   Not on file   Immunization History  Administered Date(s) Administered   Hepatitis A, Adult 01/03/2019, 08/02/2019   Hepatitis B, adult 07/23/2016   Influenza Split 07/31/2015, 07/25/2016, 08/01/2019, 07/30/2020   Influenza Whole 07/25/2012    Influenza,inj,Quad PF,6+ Mos 07/31/2012, 08/01/2017, 08/10/2018   Influenza-Unspecified 07/30/2020, 07/25/2021   Moderna Sars-Covid-2 Vaccination 11/05/2019, 12/03/2019, 06/25/2020, 01/01/2021, 07/25/2021   Pneumococcal Conjugate-13 12/27/2018   Pneumococcal Polysaccharide-23 07/26/2016   Tdap 05/08/2015     Objective: Vital Signs: BP 128/83 (BP Location: Right Arm, Patient Position: Sitting, Cuff Size: Normal)   Pulse 78   Resp 15   Ht _0  (1.676 m)   Wt 159 lb 3.2 oz (72.2 kg)   BMI 25.70 kg/m    Physical Exam Vitals and nursing note reviewed.  Constitutional:      Appearance: She is well-developed.  HENT:     Head: Normocephalic and atraumatic.  Eyes:     Conjunctiva/sclera: Conjunctivae normal.  Cardiovascular:     Rate and Rhythm: Normal rate and regular rhythm.     Heart sounds: Normal heart sounds.  Pulmonary:     Effort: Pulmonary effort is normal.     Breath sounds: Normal breath sounds.  Abdominal:     General: Bowel sounds are normal.     Palpations: Abdomen is soft.  Musculoskeletal:     Cervical back: Normal range of motion.  Lymphadenopathy:     Cervical: No cervical adenopathy.  Skin:    General: Skin is warm and dry.     Capillary Refill: Capillary refill takes less than 2 seconds.  Neurological:     Mental Status: She is alert and oriented to person, place, and time.  Psychiatric:        Behavior: Behavior normal.      Musculoskeletal Exam: C-spine thoracic and lumbar spine were in good range of motion.  She had no SI joint tenderness.  Shoulder joints, elbow joints, wrist joints, MCPs PIPs and DIPs with good range of motion.  She had tenderness on palpation over the right first MCP joint.  Hip joints and knee joints with good range of motion.  No warmth swelling or effusion was noted.  She had bilateral pes cavus.  There was no synovitis or tenderness over MTPs or PIPs.  There was no Achilles tendinitis or planter fasciitis.  CDAI Exam: CDAI  Score: -- Patient Global: --; Provider Global: -- Swollen: --; Tender: -- Joint Exam 07/29/2022   No joint exam has been documented for this visit   There is currently no information documented on the homunculus. Go to the Rheumatology activity and complete the homunculus joint exam.  Investigation: No additional findings.  Imaging: No results found.  Recent Labs: Lab Results  Component Value Date   WBC 7.7 06/28/2022   HGB 16.0 (H) 06/28/2022   PLT 324 06/28/2022  NA 138 07/25/2022   K 3.5 07/25/2022   CL 104 07/25/2022   CO2 25 07/25/2022   GLUCOSE 228 (H) 07/25/2022   BUN 7 07/25/2022   CREATININE 0.60 07/25/2022   BILITOT 0.6 07/25/2022   ALKPHOS 80 07/25/2022   AST 16 07/25/2022   ALT 21 07/25/2022   PROT 7.2 07/25/2022   ALBUMIN 3.8 07/25/2022   CALCIUM 9.4 07/25/2022   GFRAA >60 11/12/2019    Speciality Comments: No specialty comments available.  Procedures:  No procedures performed Allergies: Metronidazole, Shellfish allergy, Empagliflozin-linagliptin, Lisinopril, Metformin hcl, Prednisone, Dulaglutide, and Liraglutide   Assessment / Plan:     Visit Diagnoses: Polyarthralgia -patient states her joint pain started at age 19 with the onset of Crohn's disease.  The joint pain was mostly in her right shoulder right hand, knee joints and her feet.  She has noticed intermittent swelling in her right knee joint.  She states she has had viscosupplement injections by Dr. Tamala Julian in the past.  She also had an episode of Planter fasciitis 2 years ago for which she was seen by Dr. Cannon Kettle.  She gives history of morning stiffness in her hands.  She had nerve conduction velocity which showed a right carpal tunnel syndrome.  01/31/22: ESR 18, CRP 5 - Plan: Sedimentation rate, Rheumatoid factor, Cyclic citrul peptide antibody, IgG, ANA.  I had a detailed discussion with the patient today.  I discussed that sometimes people develop inflammatory arthritis along with Crohn's disease  and may require additional treatment like methotrexate.  She is on Depo-Provera.  I do not see any synovitis today.  We may consider adding methotrexate in the future.  Carpal tunnel syndrome, right upper limb-patient had nerve conduction velocity on Mar 29, 2022 which showed right moderate median nerve entrapment at the wrist.  Patient states Dr. Tempie Donning advised surgery but she did not want to have surgery.  I discussed the option of ultrasound-guided cortisone injection.  Patient wants to proceed with that.  I will schedule an appointment.  Pain in both hands -she complains of morning stiffness in her bilateral hands.  No synovitis was noted.  She had tenderness on palpation of her right first MCP joint.  I will obtain x-rays today and also will schedule ultrasound of her right hand.  Plan: XR Hand 2 View Right, XR Hand 2 View Left.  X-rays of bilateral hands were unremarkable.  Chronic pain of both knees -she complains of pain and discomfort in her bilateral knee joints.  I do not have x-rays available.  Patient states she has had viscosupplement injections in the past.  Plan: XR KNEE 3 VIEW RIGHT, XR KNEE 3 VIEW LEFT.[name]  X-rays of bilateral knee joints were unremarkable.  Patient states she has fallen twice in the last 2 years.  I advised her to exercise and build the strength in her lower extremities.  Pain in both feet -she has bilateral pes cavus.  She gives history of pain and stiffness in her bilateral feet.  No synovitis was noted.  Patient states she had an episode of Planter fasciitis about 2 years ago which was treated by Dr. Cannon Kettle.  She had no recurrence of planter fasciitis.  Plan: XR Foot 2 Views Right, XR Foot 2 Views Left.  X-rays showed degenerative changes in the PIP and DIP joints.  Crohn's disease of both small and large intestine with other complication (HCC)-patient was diagnosed with Crohn's disease by Dr. Collene Mares at age 15.  She was on prednisone initially  which caused diarrhea.   She was was started on as needed and had an inadequate response.  She was on Humira for some time which did not work as well.  She was on IV Remicade from July 22 to April 23 which worked well but was discontinued due to insurance coverage.  She had been on Stelara since May 2023 which has not been working for her.  She has been having nausea vomiting and diarrhea.  [facility] a [facility] opinion and was recommended to go back on IV Remicade.  She has an appointment with Dr. Therisa Doyne to discuss going back on IV Remicade.  High risk medication use -prior treatment Imuran, Humira-inadequate response, IV Remicade July 2022 till April 2023-discontinued due to insurance coverage.  Stelara since May 2023-and an adequate response.  Other medical problems are listed as follows:  History of gastroesophageal reflux (GERD)  Diverticular disease of colon  History of IBS  Essential hypertension  PSVT (paroxysmal supraventricular tachycardia) (HCC)  Coronary artery disease involving native heart without angina pectoris, unspecified vessel or lesion type  Type 2 diabetes mellitus with other diabetic arthropathy, with long-term current use of insulin (HCC)  Pure hypercholesterolemia  Mild intermittent asthma with (acute) exacerbation  Vitamin D deficiency  B12 deficiency  Adjustment insomnia  History of PCOS  Family history of breast cancer  Orders: Orders Placed This Encounter  Procedures   XR Hand 2 View Right   XR Hand 2 View Left   XR KNEE 3 VIEW RIGHT   XR KNEE 3 VIEW LEFT   XR Foot 2 Views Right   XR Foot 2 Views Left   Sedimentation rate   Rheumatoid factor   Cyclic citrul peptide antibody, IgG   ANA   No orders of the defined types were placed in this encounter.   Follow-Up Instructions: Return for Polyarthralgia, Crohn's disease.   Bo Merino, MD  Note - This record has been created using Editor, commissioning.  Chart creation errors have been sought, but may not  always  have been located. Such creation errors do not reflect on  the standard of medical care.

## 2022-07-17 DIAGNOSIS — Z79899 Other long term (current) drug therapy: Secondary | ICD-10-CM | POA: Insufficient documentation

## 2022-07-17 DIAGNOSIS — K50119 Crohn's disease of large intestine with unspecified complications: Secondary | ICD-10-CM | POA: Insufficient documentation

## 2022-07-20 ENCOUNTER — Other Ambulatory Visit (HOSPITAL_COMMUNITY): Payer: Self-pay

## 2022-07-21 ENCOUNTER — Telehealth: Payer: Self-pay | Admitting: *Deleted

## 2022-07-21 DIAGNOSIS — Z1159 Encounter for screening for other viral diseases: Secondary | ICD-10-CM

## 2022-07-21 DIAGNOSIS — E1165 Type 2 diabetes mellitus with hyperglycemia: Secondary | ICD-10-CM

## 2022-07-21 NOTE — Telephone Encounter (Signed)
[  3:48 PM] Cindy Long, Cindy Long  CAN YOU ASK DR PLOT CAN HE PUT IN A LAB ORDER FOR HEP C AND HGA1C SO I CAN HAVE LABS DONE.  MYCHART IS SAYING I NEED THE HEP C DONE AND I WAS IN THE ER WHEN I WAS SUPPOSE TO SEE ENDO TO GET HGA1C.Marland KitchenJohny Chess

## 2022-07-22 NOTE — Telephone Encounter (Signed)
Notified pt MD entered labs.Marland KitchenJohny Chess

## 2022-07-22 NOTE — Telephone Encounter (Signed)
Okay.  Thanks.

## 2022-07-25 ENCOUNTER — Other Ambulatory Visit (INDEPENDENT_AMBULATORY_CARE_PROVIDER_SITE_OTHER): Payer: 59

## 2022-07-25 ENCOUNTER — Other Ambulatory Visit (HOSPITAL_COMMUNITY): Payer: Self-pay

## 2022-07-25 ENCOUNTER — Other Ambulatory Visit: Payer: 59

## 2022-07-25 ENCOUNTER — Other Ambulatory Visit: Payer: Self-pay

## 2022-07-25 DIAGNOSIS — Z794 Long term (current) use of insulin: Secondary | ICD-10-CM

## 2022-07-25 DIAGNOSIS — E1165 Type 2 diabetes mellitus with hyperglycemia: Secondary | ICD-10-CM

## 2022-07-25 DIAGNOSIS — E1100 Type 2 diabetes mellitus with hyperosmolarity without nonketotic hyperglycemic-hyperosmolar coma (NKHHC): Secondary | ICD-10-CM | POA: Diagnosis not present

## 2022-07-25 DIAGNOSIS — Z1159 Encounter for screening for other viral diseases: Secondary | ICD-10-CM

## 2022-07-25 LAB — COMPREHENSIVE METABOLIC PANEL
ALT: 21 U/L (ref 0–35)
AST: 16 U/L (ref 0–37)
Albumin: 3.8 g/dL (ref 3.5–5.2)
Alkaline Phosphatase: 80 U/L (ref 39–117)
BUN: 7 mg/dL (ref 6–23)
CO2: 25 mEq/L (ref 19–32)
Calcium: 9.4 mg/dL (ref 8.4–10.5)
Chloride: 104 mEq/L (ref 96–112)
Creatinine, Ser: 0.6 mg/dL (ref 0.40–1.20)
GFR: 109.14 mL/min (ref >60.00)
Glucose, Bld: 228 mg/dL — ABNORMAL HIGH (ref 70–99)
Potassium: 3.5 mEq/L (ref 3.5–5.1)
Sodium: 138 mEq/L (ref 135–145)
Total Bilirubin: 0.6 mg/dL (ref 0.2–1.2)
Total Protein: 7.2 g/dL (ref 6.0–8.3)

## 2022-07-26 ENCOUNTER — Other Ambulatory Visit (HOSPITAL_COMMUNITY): Payer: Self-pay

## 2022-07-26 LAB — HEMOGLOBIN A1C
Hgb A1c MFr Bld: 10 % of total Hgb — ABNORMAL HIGH (ref <5.7)
Mean Plasma Glucose: 240 mg/dL
eAG (mmol/L): 13.3 mmol/L

## 2022-07-27 ENCOUNTER — Encounter (INDEPENDENT_AMBULATORY_CARE_PROVIDER_SITE_OTHER): Payer: Self-pay

## 2022-07-27 ENCOUNTER — Telehealth: Payer: Self-pay | Admitting: *Deleted

## 2022-07-27 NOTE — Telephone Encounter (Signed)
[  2:24 PM] Sasha, Rogel Hey!  Can you ask Dr. Camila Li if he could accept my husband as a new patient.  I think he has a pulled muscle

## 2022-07-29 ENCOUNTER — Ambulatory Visit (INDEPENDENT_AMBULATORY_CARE_PROVIDER_SITE_OTHER): Payer: 59

## 2022-07-29 ENCOUNTER — Encounter: Payer: Self-pay | Admitting: Rheumatology

## 2022-07-29 ENCOUNTER — Ambulatory Visit: Payer: 59 | Attending: Rheumatology | Admitting: Rheumatology

## 2022-07-29 VITALS — BP 128/83 | HR 78 | Resp 15 | Ht 66.0 in | Wt 159.2 lb

## 2022-07-29 DIAGNOSIS — Z8742 Personal history of other diseases of the female genital tract: Secondary | ICD-10-CM

## 2022-07-29 DIAGNOSIS — M2550 Pain in unspecified joint: Secondary | ICD-10-CM | POA: Diagnosis not present

## 2022-07-29 DIAGNOSIS — M79642 Pain in left hand: Secondary | ICD-10-CM

## 2022-07-29 DIAGNOSIS — K50818 Crohn's disease of both small and large intestine with other complication: Secondary | ICD-10-CM

## 2022-07-29 DIAGNOSIS — G5601 Carpal tunnel syndrome, right upper limb: Secondary | ICD-10-CM | POA: Diagnosis not present

## 2022-07-29 DIAGNOSIS — E559 Vitamin D deficiency, unspecified: Secondary | ICD-10-CM

## 2022-07-29 DIAGNOSIS — I471 Supraventricular tachycardia, unspecified: Secondary | ICD-10-CM

## 2022-07-29 DIAGNOSIS — M79672 Pain in left foot: Secondary | ICD-10-CM

## 2022-07-29 DIAGNOSIS — M25561 Pain in right knee: Secondary | ICD-10-CM | POA: Diagnosis not present

## 2022-07-29 DIAGNOSIS — G8929 Other chronic pain: Secondary | ICD-10-CM | POA: Diagnosis not present

## 2022-07-29 DIAGNOSIS — E11618 Type 2 diabetes mellitus with other diabetic arthropathy: Secondary | ICD-10-CM

## 2022-07-29 DIAGNOSIS — K5010 Crohn's disease of large intestine without complications: Secondary | ICD-10-CM | POA: Diagnosis not present

## 2022-07-29 DIAGNOSIS — Z803 Family history of malignant neoplasm of breast: Secondary | ICD-10-CM

## 2022-07-29 DIAGNOSIS — M79641 Pain in right hand: Secondary | ICD-10-CM

## 2022-07-29 DIAGNOSIS — E7800 Pure hypercholesterolemia, unspecified: Secondary | ICD-10-CM

## 2022-07-29 DIAGNOSIS — M25562 Pain in left knee: Secondary | ICD-10-CM | POA: Diagnosis not present

## 2022-07-29 DIAGNOSIS — M79671 Pain in right foot: Secondary | ICD-10-CM

## 2022-07-29 DIAGNOSIS — Z8719 Personal history of other diseases of the digestive system: Secondary | ICD-10-CM

## 2022-07-29 DIAGNOSIS — Z79899 Other long term (current) drug therapy: Secondary | ICD-10-CM | POA: Diagnosis not present

## 2022-07-29 DIAGNOSIS — K5730 Diverticulosis of large intestine without perforation or abscess without bleeding: Secondary | ICD-10-CM | POA: Diagnosis not present

## 2022-07-29 DIAGNOSIS — F5102 Adjustment insomnia: Secondary | ICD-10-CM

## 2022-07-29 DIAGNOSIS — M222X1 Patellofemoral disorders, right knee: Secondary | ICD-10-CM

## 2022-07-29 DIAGNOSIS — K602 Anal fissure, unspecified: Secondary | ICD-10-CM

## 2022-07-29 DIAGNOSIS — Z794 Long term (current) use of insulin: Secondary | ICD-10-CM

## 2022-07-29 DIAGNOSIS — E538 Deficiency of other specified B group vitamins: Secondary | ICD-10-CM

## 2022-07-29 DIAGNOSIS — J4521 Mild intermittent asthma with (acute) exacerbation: Secondary | ICD-10-CM

## 2022-07-29 DIAGNOSIS — I2510 Atherosclerotic heart disease of native coronary artery without angina pectoris: Secondary | ICD-10-CM

## 2022-07-29 DIAGNOSIS — I10 Essential (primary) hypertension: Secondary | ICD-10-CM

## 2022-07-29 NOTE — Telephone Encounter (Signed)
Notified Mignon Pine w/ MD response.Marland KitchenJohny Chess

## 2022-07-29 NOTE — Telephone Encounter (Signed)
Okay.  Thanks.

## 2022-08-01 ENCOUNTER — Other Ambulatory Visit (HOSPITAL_COMMUNITY): Payer: Self-pay

## 2022-08-01 LAB — CYCLIC CITRUL PEPTIDE ANTIBODY, IGG: Cyclic Citrullin Peptide Ab: <16 UNITS

## 2022-08-01 LAB — ANTI-NUCLEAR AB-TITER (ANA TITER): ANA Titer 1: 1:160 {titer} — ABNORMAL HIGH

## 2022-08-01 LAB — ANA: Anti Nuclear Antibody (ANA): POSITIVE — AB

## 2022-08-01 LAB — SEDIMENTATION RATE: Sed Rate: 6 mm/h (ref 0–20)

## 2022-08-01 LAB — RHEUMATOID FACTOR: Rheumatoid fact SerPl-aCnc: <14 IU/mL (ref <14)

## 2022-08-02 ENCOUNTER — Other Ambulatory Visit (HOSPITAL_COMMUNITY): Payer: Self-pay

## 2022-08-02 DIAGNOSIS — K5010 Crohn's disease of large intestine without complications: Secondary | ICD-10-CM | POA: Diagnosis not present

## 2022-08-02 NOTE — Progress Notes (Signed)
Please add to ENA C3 and C4

## 2022-08-02 NOTE — Progress Notes (Signed)
ANA positive, sedimentation rate normal, rheumatoid factor negative, anti-CCP negative.  ANA is positive, I would recommend patient to come in for further labs which should include ENA, C3, C4, anticardiolipin antibody and beta-2 GP 1.

## 2022-08-03 ENCOUNTER — Other Ambulatory Visit: Payer: Self-pay | Admitting: *Deleted

## 2022-08-03 DIAGNOSIS — M2550 Pain in unspecified joint: Secondary | ICD-10-CM

## 2022-08-03 DIAGNOSIS — K50818 Crohn's disease of both small and large intestine with other complication: Secondary | ICD-10-CM

## 2022-08-03 DIAGNOSIS — Z79899 Other long term (current) drug therapy: Secondary | ICD-10-CM

## 2022-08-05 ENCOUNTER — Other Ambulatory Visit: Payer: Self-pay

## 2022-08-05 DIAGNOSIS — K50818 Crohn's disease of both small and large intestine with other complication: Secondary | ICD-10-CM

## 2022-08-05 DIAGNOSIS — Z79899 Other long term (current) drug therapy: Secondary | ICD-10-CM | POA: Diagnosis not present

## 2022-08-05 DIAGNOSIS — M2550 Pain in unspecified joint: Secondary | ICD-10-CM

## 2022-08-09 ENCOUNTER — Other Ambulatory Visit (HOSPITAL_COMMUNITY): Payer: Self-pay

## 2022-08-09 LAB — SJOGRENS SYNDROME-B EXTRACTABLE NUCLEAR ANTIBODY: SSB (La) (ENA) Antibody, IgG: <1 AI

## 2022-08-09 LAB — CARDIOLIPIN ANTIBODIES, IGG, IGM, IGA
Anticardiolipin IgA: <2 APL-U/mL (ref <20.0)
Anticardiolipin IgG: <2 GPL-U/mL (ref <20.0)
Anticardiolipin IgM: <2 MPL-U/mL (ref <20.0)

## 2022-08-09 LAB — ANTI-DNA ANTIBODY, DOUBLE-STRANDED: ds DNA Ab: <1 IU/mL

## 2022-08-09 LAB — ANTI-SMITH ANTIBODY: ENA SM Ab Ser-aCnc: <1 AI

## 2022-08-09 LAB — BETA-2 GLYCOPROTEIN ANTIBODIES
Beta-2 Glyco 1 IgA: <2 U/mL (ref <20.0)
Beta-2 Glyco 1 IgM: <2 U/mL (ref <20.0)
Beta-2 Glyco I IgG: <2 U/mL (ref <20.0)

## 2022-08-09 LAB — SJOGRENS SYNDROME-A EXTRACTABLE NUCLEAR ANTIBODY: SSA (Ro) (ENA) Antibody, IgG: <1 AI

## 2022-08-09 LAB — RNP ANTIBODY: Ribonucleic Protein(ENA) Antibody, IgG: <1 AI

## 2022-08-09 LAB — ANTI-SCLERODERMA ANTIBODY: Scleroderma (Scl-70) (ENA) Antibody, IgG: <1 AI

## 2022-08-09 LAB — C3 AND C4
C3 Complement: 166 mg/dL (ref 83–193)
C4 Complement: 33 mg/dL (ref 15–57)

## 2022-08-10 NOTE — Progress Notes (Signed)
All the labs are within normal limits.  I will discuss results at the follow-up visit.

## 2022-08-11 ENCOUNTER — Other Ambulatory Visit (HOSPITAL_COMMUNITY): Payer: Self-pay

## 2022-08-11 ENCOUNTER — Other Ambulatory Visit (INDEPENDENT_AMBULATORY_CARE_PROVIDER_SITE_OTHER): Payer: 59

## 2022-08-11 DIAGNOSIS — R002 Palpitations: Secondary | ICD-10-CM | POA: Diagnosis not present

## 2022-08-11 LAB — COMPREHENSIVE METABOLIC PANEL
ALT: 14 U/L (ref 0–35)
AST: 12 U/L (ref 0–37)
Albumin: 4.1 g/dL (ref 3.5–5.2)
Alkaline Phosphatase: 75 U/L (ref 39–117)
BUN: 9 mg/dL (ref 6–23)
CO2: 24 mEq/L (ref 19–32)
Calcium: 9.2 mg/dL (ref 8.4–10.5)
Chloride: 103 mEq/L (ref 96–112)
Creatinine, Ser: 0.76 mg/dL (ref 0.40–1.20)
GFR: 95.25 mL/min (ref >60.00)
Glucose, Bld: 269 mg/dL — ABNORMAL HIGH (ref 70–99)
Potassium: 3.7 mEq/L (ref 3.5–5.1)
Sodium: 138 mEq/L (ref 135–145)
Total Bilirubin: 0.6 mg/dL (ref 0.2–1.2)
Total Protein: 7.4 g/dL (ref 6.0–8.3)

## 2022-08-11 LAB — T3, FREE: T3, Free: 3.4 pg/mL (ref 2.3–4.2)

## 2022-08-11 LAB — T4, FREE: Free T4: 0.79 ng/dL (ref 0.60–1.60)

## 2022-08-11 LAB — TSH: TSH: 0.89 u[IU]/mL (ref 0.35–5.50)

## 2022-08-12 ENCOUNTER — Other Ambulatory Visit (HOSPITAL_COMMUNITY): Payer: Self-pay

## 2022-08-12 DIAGNOSIS — K5000 Crohn's disease of small intestine without complications: Secondary | ICD-10-CM | POA: Diagnosis not present

## 2022-08-12 DIAGNOSIS — K5010 Crohn's disease of large intestine without complications: Secondary | ICD-10-CM | POA: Diagnosis not present

## 2022-08-19 NOTE — Progress Notes (Unsigned)
Office Visit Note  Patient: Cindy Long             Date of Birth: July 25, 1976           MRN: [id number]             PCP: Cassandria Anger, MD Referring: Cassandria Anger, MD Visit Date: 08/24/2022 Occupation: @GUAROCC @  Subjective:  Pain in both hands  History of Present Illness: Cindy Long is a 46 y.o. female with history of Crohn's disease and polyarthralgia.  She continues to have pain and discomfort in her both hands, both knees and both feet.  She states that she continues to have tingling and numbness in her right hand..  She has tried carpal tunnel braces.  Last week she felt locking of her left thumb and index finger and the pain radiating into her left arm.  She did not notice any joint swelling.  She still has some discomfort.  None of the other joints are painful or swollen.  Activities of Daily Living:  Patient reports morning stiffness for 2 hours.   Patient Denies nocturnal pain.  Difficulty dressing/grooming: Denies Difficulty climbing stairs: Reports Difficulty getting out of chair: Reports Difficulty using hands for taps, buttons, cutlery, and/or writing: Reports  Review of Systems  Constitutional:  Negative for fatigue.  HENT:  Negative for mouth sores and mouth dryness.   Eyes:  Negative for dryness.  Respiratory:  Negative for shortness of breath.   Cardiovascular:  Negative for chest pain and palpitations.  Gastrointestinal:  Positive for constipation. Negative for blood in stool and diarrhea.  Endocrine: Negative for increased urination.  Genitourinary:  Negative for involuntary urination.  Musculoskeletal:  Positive for joint pain, joint pain, joint swelling and morning stiffness. Negative for gait problem, myalgias, muscle weakness, muscle tenderness and myalgias.  Skin:  Positive for rash. Negative for color change, hair loss and sensitivity to sunlight.  Allergic/Immunologic: Positive for susceptible to infections.  Neurological:   Negative for dizziness and headaches.  Hematological:  Negative for swollen glands.  Psychiatric/Behavioral:  Negative for depressed mood and sleep disturbance. The patient is not nervous/anxious.     PMFS History:  Patient Active Problem List   Diagnosis Date Noted   PSVT (paroxysmal supraventricular tachycardia) 06/08/2022   Anal fissure 05/19/2022   Fecal urgency 05/19/2022   Flatulence, eructation and gas pain 05/19/2022   Iron deficiency anemia 05/19/2022   Left lower quadrant pain 05/19/2022   Nausea 18/29/9371   Periumbilical pain 69/67/8938   Rectal pain 05/19/2022   Vomiting without nausea 05/19/2022   Thumb pain, right 05/19/2022   Well adult exam 05/19/2022   Crohn disease ([place]) 03/28/2022   Chest pain 03/05/2022   Dehydration 03/05/2022   Elevated troponin    Pain of left upper extremity 03/04/2022   Tachycardia 03/04/2022   Diarrhea 03/04/2022   Acute nonintractable headache 03/04/2022   Numbness and tingling in right hand 02/24/2022   Coronary artery disease 02/23/2022   Asthmatic bronchitis with acute exacerbation 02/23/2022   Upper respiratory infection 02/23/2022   Adjustment insomnia 12/22/2021   Other specified anxiety disorders 12/22/2021   Crohn's disease of small and large intestines ([place]) 12/22/2021   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   Family history of breast cancer 12/22/2021   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   Gastroesophageal reflux disease 12/22/2021   Hyperglycemia due to type 2 diabetes mellitus ([place]) 12/22/2021   Intra-abdominal and pelvic swelling, mass and lump, unspecified site 12/22/2021  Long term (current) use of insulin ([place]) 12/22/2021   Mild intermittent asthma with (acute) exacerbation 12/22/2021   Pain in thoracic spine 12/22/2021   Rectal bleeding 12/22/2021   Right ankle sprain 12/22/2021   Crohn's disease ([place]) 12/13/2021   Vitamin D deficiency 12/13/2021   B12 deficiency 12/13/2021   Depression 12/13/2021   Chest  pressure 11/18/2021   Palpitations 11/18/2021   Essential hypertension 11/18/2021   Pure hypercholesterolemia 11/18/2021   Patellofemoral arthritis 10/21/2021   Patellofemoral disorder, right 07/16/2021   Muscle atrophy 07/16/2021   Postpartum care following cesarean delivery (2/10) 12/17/2013   Indication for care in labor or delivery 11/30/2013   Preexisting diabetes complicating pregnancy in second trimester, antepartum 08/18/2013   IBS (irritable bowel syndrome) 02/05/2013   Dyslipidemia 02/05/2013   Abnormal uterine bleeding 06/18/2012   Pelvic pain 06/18/2012   Type II diabetes mellitus ([place]) 06/18/2012    Past Medical History:  Diagnosis Date   Chest pressure 11/18/2021   Crohn disease ([place])    Diabetes mellitus    IDDM   Essential hypertension 11/18/2021   GERD (gastroesophageal reflux disease)    no meds currently   High cholesterol    Hypertension    Mastitis    right breast   Postpartum care following cesarean delivery (2/10) 12/17/2013   Pure hypercholesterolemia 11/18/2021    Family History  Problem Relation Age of Onset   Hypertension Mother    Hypercholesterolemia Mother    Other Sister        brain tumor   Diabetes Maternal Aunt    Hypertension Maternal Grandmother    Cancer Maternal Grandmother        breast, colon   Diabetes Maternal Grandmother    Hypertension Maternal Grandfather    Heart attack Cousin 3   Healthy Daughter    Healthy Daughter    Past Surgical History:  Procedure Laterality Date   CERVICAL CERCLAGE     CERVICAL CERCLAGE N/A 06/21/2013   Procedure: [place] CERCLAGE CERVICAL;  Surgeon: Marvene Staff, MD;  Location: [facility];  Service: Gynecology;  Laterality: N/A;   CESAREAN SECTION  2008   CESAREAN SECTION N/A 12/17/2013   Procedure: Repeat CESAREAN SECTION with Cerclage Removal;  Surgeon: Marvene Staff, MD;  Location: [facility];  Service: Obstetrics;  Laterality: N/A;  EDD: 12/22/13   CHOLECYSTECTOMY      CHOLECYSTECTOMY OPEN  08/08/2011   GANGLION CYST EXCISION Right 1997   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   Social History   Social History Narrative   Not on file   Immunization History  Administered Date(s) Administered   Hepatitis A, Adult 01/03/2019, 08/02/2019   Hepatitis B, adult 07/23/2016   Influenza Split 07/31/2015, 07/25/2016, 08/01/2019, 07/30/2020   Influenza Whole 07/25/2012   Influenza,inj,Quad PF,6+ Mos 07/31/2012, 08/01/2017, 08/10/2018   Influenza-Unspecified 07/30/2020, 07/25/2021   Moderna Sars-Covid-2 Vaccination 11/05/2019, 12/03/2019, 06/25/2020, 01/01/2021, 07/25/2021   Pneumococcal Conjugate-13 12/27/2018   Pneumococcal Polysaccharide-23 07/26/2016   Tdap 05/08/2015     Objective: Vital Signs: BP 115/72 (BP Location: Left Arm, Patient Position: Sitting, Cuff Size: Normal)   Pulse 83   Resp 15   Ht 5' 6"  (1.676 m)   Wt 158 lb (71.7 kg)   BMI 25.50 kg/m    Physical Exam Vitals and nursing note reviewed.  Constitutional:      Appearance: She is well-developed.  HENT:     Head: Normocephalic and atraumatic.  Eyes:     Conjunctiva/sclera: Conjunctivae normal.  Cardiovascular:  Rate and Rhythm: Normal rate and regular rhythm.     Heart sounds: Normal heart sounds.  Pulmonary:     Effort: Pulmonary effort is normal.     Breath sounds: Normal breath sounds.  Abdominal:     General: Bowel sounds are normal.     Palpations: Abdomen is soft.  Musculoskeletal:     Cervical back: Normal range of motion.  Lymphadenopathy:     Cervical: No cervical adenopathy.  Skin:    General: Skin is warm and dry.     Capillary Refill: Capillary refill takes less than 2 seconds.  Neurological:     Mental Status: She is alert and oriented to person, place, and time.  Psychiatric:        Behavior: Behavior normal.      Musculoskeletal Exam: Cervical, thoracic and lumbar spine were in good range of motion.  Shoulder joints, elbow joints,  wrist joints, MCPs PIPs and DIPs been good range of motion with no synovitis.  She had thickening of the left index finger flexor tendon.  Hip joints and knee joints with good range of motion.  There was no tenderness over ankles or MTPs.  CDAI Exam: CDAI Score: -- Patient Global: --; Provider Global: -- Swollen: --; Tender: -- Joint Exam 08/24/2022   No joint exam has been documented for this visit   There is currently no information documented on the homunculus. Go to the Rheumatology activity and complete the homunculus joint exam.  Investigation: No additional findings.  Imaging: US Guided Needle Placement  Result Date: 08/24/2022 Ultrasound guided injection is preferred based studies that show increased duration, increased effect, greater accuracy, decreased procedural pain, increased response rate, and decreased cost with ultrasound guided versus blind injection.   Verbal informed consent obtained.  Time-out conducted.  Noted no overlying erythema, induration, or other signs of local infection. Ultrasound-guided carpal tunnel syndrome injection: After sterile prep with Betadine, injected 0.5 ml of 1% lidocaine and 10 mg Kenalog using a 27-gauge needle, into the median nerve sheath.    [place] COMPLETE JOINT SPACE STRUCTURES UP BILAT  Result Date: 08/24/2022 Ultrasound examination of bilateral hands was performed per EULAR recommendations. Using 15 MHz transducer, grayscale and power Doppler bilateral second, third, and fifth MCP joints and bilateral wrist joints both dorsal and volar aspects were evaluated to look for synovitis or tenosynovitis. The findings were there was no synovitis or tenosynovitis on ultrasound examination. Right median nerve was 0.18 cm squares which was greater than upper limits of normal and left median nerve was 0.11 cm squares which was within normal limits. Impression: Ultrasound examination did not show any synovitis or tenosynovitis.  Right median nerve was  enlarged at 0.8 cm.  XR KNEE 3 VIEW LEFT  Result Date: 07/29/2022 No medial or lateral compartment narrowing was noted.  No patellofemoral narrowing was noted.  No chondrocalcinosis was noted. Impression: Unremarkable x-ray of the knee.  XR KNEE 3 VIEW RIGHT  Result Date: 07/29/2022 No medial or lateral compartment narrowing was noted.  No patellofemoral narrowing was noted.  No chondrocalcinosis was noted. Impression: Unremarkable x-ray of the knee.  XR Foot 2 Views Left  Result Date: 07/29/2022 PIP and DIP narrowing was noted.  No MTP, intertarsal, tibiotalar or subtalar joint space narrowing was noted.  Inferior calcaneal spur was noted. Impression: These findings are consistent with degenerative changes of the foot.  XR Foot 2 Views Right  Result Date: 07/29/2022 PIP and DIP narrowing was noted.  No MTP, intertarsal, tibiotalar  or subtalar joint space narrowing was noted.  Dorsal spurring was noted.  Inferior calcaneal spur was noted.  No erosive changes were noted. Impression: These findings are consistent with degenerative changes of the foot.  XR Hand 2 View Left  Result Date: 07/29/2022 No CMC, MCP, PIP, DIP, intercarpal or radiocarpal joint space narrowing was noted.  No erosive changes were noted. Impression: Unremarkable x-ray of the hand.  XR Hand 2 View Right  Result Date: 07/29/2022 No CMC, MCP, PIP, DIP, intercarpal or radiocarpal joint space narrowing was noted.  No erosive changes were noted. Impression: Unremarkable x-ray of the hand.   Recent Labs: Lab Results  Component Value Date   WBC 7.7 06/28/2022   HGB 16.0 (H) 06/28/2022   PLT 324 06/28/2022   NA 138 08/11/2022   K 3.7 08/11/2022   CL 103 08/11/2022   CO2 24 08/11/2022   GLUCOSE 269 (H) 08/11/2022   BUN 9 08/11/2022   CREATININE 0.76 08/11/2022   BILITOT 0.6 08/11/2022   ALKPHOS 75 08/11/2022   AST 12 08/11/2022   ALT 14 08/11/2022   PROT 7.4 08/11/2022   ALBUMIN 4.1 08/11/2022   CALCIUM 9.2  08/11/2022   GFRAA >60 11/12/2019   August 05, 2022 ENA (SSB, SSA, RNP, Tamala Julian, [place] 70, dsDNA) this is negative, anticardiolipin negative, beta-2 GP 1 negative, complements normal  July 29, 2022 ANA 1: 160 NH, ESR 6, RF negative, anti-CCP negative   Speciality Comments: Imuran, Humira-inadequate response, IV Remicade July 22 -April 23-insurance Stelara May 2023-present-inadequate response  Procedures:   Ultrasound guided injection is preferred based studies that show increased duration, increased effect, greater accuracy, decreased procedural pain, increased response rate, and decreased cost with ultrasound guided versus blind injection.   Verbal informed consent obtained.  Time-out conducted.  Noted no overlying erythema, induration, or other signs of local infection. Ultrasound-guided carpal tunnel syndrome injection: After sterile prep with Betadine, injected 0.5 ml of 1% lidocaine and 10 mg Kenalog using a 27-gauge needle, into the median nerve sheath.    Hand/UE Inj: R carpal tunnel for carpal tunnel syndrome on 08/24/2022 4:30 PM Indications: pain Details: 27 G needle, ultrasound-guided ulnar approach Medications: 0.5 mL lidocaine 1 %; 10 mg triamcinolone acetonide 40 MG/ML Aspirate: 0 mL Outcome: tolerated well, no immediate complications Procedure, treatment alternatives, risks and benefits explained, specific risks discussed. Consent was given by the patient. Immediately prior to procedure a time out was called to verify the correct patient, procedure, equipment, support staff and site/side marked as required. Patient was prepped and draped in the usual sterile fashion.     Allergies: Metronidazole, Shellfish allergy, Empagliflozin-linagliptin, Lisinopril, Metformin hcl, Prednisone, Dulaglutide, and Liraglutide   Assessment / Plan:     Visit Diagnoses: Polyarthralgia - History of pain in shoulders, hands, knees and her feet.  She gives history of intermittent swelling in  her right knee joint.  History of [place] fasciitis in the past.   Positive ANA-her ANA was positive which could be due to underlying Crohn's disease due to use of Remicade.  She had no other clinical features of lupus or similar illness.  She denies history of oral ulcers, nasal ulcers, malar rash, photosensitivity, Raynaud's phenomenon or lymphadenopathy.  I will obtain additional labs including ANA, complements, anticardiolipin and beta-2 GP 1 which were all within normal limits.  Left findings were discussed with the patient.  Carpal tunnel syndrome, right upper limb - Carpal tunnel syndrome was noted on the nerve conduction velocities in May 2023.  On ultrasound examination today right median nerve was 0.18 cm which was enlarged.  Patient continues to have pain and discomfort in her right hand and nocturnal pain.  After different treatment options and their side effects were discussed we decided to proceed with ultrasound-guided right carpal tunnel syndrome injection.  Ultrasound-guided injection was discussed. - Plan: US Guided Needle Placement.  The procedure was described above.  Patient tolerated the procedure well.  Postprocedure instructions were given.  She was advised to use carpal tunnel brace over the next 3 days.  And then she may use carpal tunnel brace at nighttime.  Pain in both hands - History of pain and discomfort in bilateral hands.  No synovitis was noted on the examination.  X-rays were unremarkable.  X-ray of endings were discussed with the patient.- Plan: [place] COMPLETE JOINT SPACE STRUCTURES UP BILAT.  Ultrasound examination of bilateral hands did not show any synovitis or tenosynovitis.  Chronic pain of both knees -she gives history of intermittent swelling in the right knee joint.  No warmth swelling or effusion was noted.  X-rays of bilateral knee joints were unremarkable.  X-ray findings were discussed with the patient.  I advised her to contact me if she has increased pain and  discomfort.  Pain in both feet -she has intermittent discomfort in her feet.  No synovitis was noted.  X-rays showed degenerative changes.  X-rays findings were discussed with the patient.  Proper fitting shoes were advised.  Crohn's disease of both small and large intestine with other complication ([place]) - Diagnosed at age 87.  She has been on IV Remicade from July 22 till April 23.  Discontinued due insurance.  Patient states that she went to [facility] and was advised to go back on IV Remicade.  She is planning to restart IV Remicade and will discontinue Stelara.   High risk medication use - Stelara May 2023-she had an inadequate response, Remicade IV July 22 till April 2023 discontinued due to insurance.  Prior treatment Imuran Humira.  Patient will restart IV Remicade.  Other medical problems are listed as follows:  History of IBS-she has a remittent diarrhea and constipation.  Diverticular disease of colon  History of gastroesophageal reflux (GERD)  Essential hypertension  Coronary artery disease involving native heart without angina pectoris, unspecified vessel or lesion type  PSVT (paroxysmal supraventricular tachycardia)  Pure hypercholesterolemia  Type 2 diabetes mellitus with other diabetic arthropathy, with long-term current use of insulin ([facility])  Mild intermittent asthma with (acute) exacerbation  B12 deficiency  Vitamin D deficiency  Adjustment insomnia  History of PCOS  Family history of breast cancer  Orders: Orders Placed This Encounter  Procedures   Hand/UE Inj   [place] COMPLETE JOINT SPACE STRUCTURES UP BILAT   US Guided Needle Placement   No orders of the defined types were placed in this encounter.    Follow-Up Instructions: Return in about 3 months (around 11/24/2022) for Osteoarthritis, crohn's.   Bo Merino, MD  Note - This record has been created using Editor, commissioning.  Chart creation errors have been sought, but may not always   have been located. Such creation errors do not reflect on  the standard of medical care.

## 2022-08-22 ENCOUNTER — Other Ambulatory Visit (HOSPITAL_COMMUNITY): Payer: Self-pay

## 2022-08-24 ENCOUNTER — Ambulatory Visit (INDEPENDENT_AMBULATORY_CARE_PROVIDER_SITE_OTHER): Payer: 59

## 2022-08-24 ENCOUNTER — Ambulatory Visit: Payer: 59

## 2022-08-24 ENCOUNTER — Ambulatory Visit: Payer: 59 | Attending: Rheumatology | Admitting: Rheumatology

## 2022-08-24 ENCOUNTER — Encounter: Payer: Self-pay | Admitting: Rheumatology

## 2022-08-24 VITALS — BP 115/72 | HR 83 | Resp 15 | Ht 66.0 in | Wt 158.0 lb

## 2022-08-24 DIAGNOSIS — M79641 Pain in right hand: Secondary | ICD-10-CM

## 2022-08-24 DIAGNOSIS — M79672 Pain in left foot: Secondary | ICD-10-CM

## 2022-08-24 DIAGNOSIS — M2550 Pain in unspecified joint: Secondary | ICD-10-CM

## 2022-08-24 DIAGNOSIS — Z8719 Personal history of other diseases of the digestive system: Secondary | ICD-10-CM

## 2022-08-24 DIAGNOSIS — Z794 Long term (current) use of insulin: Secondary | ICD-10-CM

## 2022-08-24 DIAGNOSIS — Z803 Family history of malignant neoplasm of breast: Secondary | ICD-10-CM

## 2022-08-24 DIAGNOSIS — E538 Deficiency of other specified B group vitamins: Secondary | ICD-10-CM

## 2022-08-24 DIAGNOSIS — G8929 Other chronic pain: Secondary | ICD-10-CM

## 2022-08-24 DIAGNOSIS — G5601 Carpal tunnel syndrome, right upper limb: Secondary | ICD-10-CM | POA: Diagnosis not present

## 2022-08-24 DIAGNOSIS — E7800 Pure hypercholesterolemia, unspecified: Secondary | ICD-10-CM

## 2022-08-24 DIAGNOSIS — F5102 Adjustment insomnia: Secondary | ICD-10-CM

## 2022-08-24 DIAGNOSIS — I471 Supraventricular tachycardia, unspecified: Secondary | ICD-10-CM

## 2022-08-24 DIAGNOSIS — E559 Vitamin D deficiency, unspecified: Secondary | ICD-10-CM

## 2022-08-24 DIAGNOSIS — R768 Other specified abnormal immunological findings in serum: Secondary | ICD-10-CM

## 2022-08-24 DIAGNOSIS — K50818 Crohn's disease of both small and large intestine with other complication: Secondary | ICD-10-CM | POA: Diagnosis not present

## 2022-08-24 DIAGNOSIS — J4521 Mild intermittent asthma with (acute) exacerbation: Secondary | ICD-10-CM

## 2022-08-24 DIAGNOSIS — I10 Essential (primary) hypertension: Secondary | ICD-10-CM

## 2022-08-24 DIAGNOSIS — Z79899 Other long term (current) drug therapy: Secondary | ICD-10-CM

## 2022-08-24 DIAGNOSIS — M25562 Pain in left knee: Secondary | ICD-10-CM

## 2022-08-24 DIAGNOSIS — M79671 Pain in right foot: Secondary | ICD-10-CM

## 2022-08-24 DIAGNOSIS — M79642 Pain in left hand: Secondary | ICD-10-CM

## 2022-08-24 DIAGNOSIS — Z8742 Personal history of other diseases of the female genital tract: Secondary | ICD-10-CM

## 2022-08-24 DIAGNOSIS — K5730 Diverticulosis of large intestine without perforation or abscess without bleeding: Secondary | ICD-10-CM

## 2022-08-24 DIAGNOSIS — M25561 Pain in right knee: Secondary | ICD-10-CM | POA: Diagnosis not present

## 2022-08-24 DIAGNOSIS — I2510 Atherosclerotic heart disease of native coronary artery without angina pectoris: Secondary | ICD-10-CM

## 2022-08-24 DIAGNOSIS — E11618 Type 2 diabetes mellitus with other diabetic arthropathy: Secondary | ICD-10-CM

## 2022-08-24 MED ORDER — LIDOCAINE HCL 1 % IJ SOLN
0.5000 mL | INTRAMUSCULAR | Status: AC | PRN
  Administered 2022-08-24: .5 mL

## 2022-08-24 MED ORDER — TRIAMCINOLONE ACETONIDE 40 MG/ML IJ SUSP
10.0000 mg | INTRAMUSCULAR | Status: AC | PRN
  Administered 2022-08-24: 10 mg

## 2022-09-02 ENCOUNTER — Encounter (HOSPITAL_COMMUNITY): Payer: Self-pay

## 2022-09-02 ENCOUNTER — Other Ambulatory Visit (HOSPITAL_COMMUNITY): Payer: Self-pay

## 2022-09-02 ENCOUNTER — Emergency Department (HOSPITAL_COMMUNITY): Payer: 59

## 2022-09-02 ENCOUNTER — Emergency Department (HOSPITAL_COMMUNITY)
Admission: EM | Admit: 2022-09-02 | Discharge: 2022-09-02 | Disposition: A | Discharge status: Home / Self Care | Payer: 59 | Attending: Emergency Medicine | Admitting: Emergency Medicine

## 2022-09-02 DIAGNOSIS — Z7982 Long term (current) use of aspirin: Secondary | ICD-10-CM | POA: Diagnosis not present

## 2022-09-02 DIAGNOSIS — R109 Unspecified abdominal pain: Secondary | ICD-10-CM | POA: Diagnosis not present

## 2022-09-02 DIAGNOSIS — K5730 Diverticulosis of large intestine without perforation or abscess without bleeding: Secondary | ICD-10-CM | POA: Diagnosis not present

## 2022-09-02 DIAGNOSIS — I10 Essential (primary) hypertension: Secondary | ICD-10-CM | POA: Insufficient documentation

## 2022-09-02 DIAGNOSIS — R1012 Left upper quadrant pain: Secondary | ICD-10-CM | POA: Diagnosis not present

## 2022-09-02 DIAGNOSIS — Z79899 Other long term (current) drug therapy: Secondary | ICD-10-CM | POA: Insufficient documentation

## 2022-09-02 LAB — C-REACTIVE PROTEIN: CRP: <0.5 mg/dL (ref <1.0)

## 2022-09-02 LAB — URINALYSIS, ROUTINE W REFLEX MICROSCOPIC
Bilirubin Urine: NEGATIVE
Glucose, UA: >=500 mg/dL — AB
Hgb urine dipstick: NEGATIVE
Ketones, ur: NEGATIVE mg/dL
Leukocytes,Ua: NEGATIVE
Nitrite: NEGATIVE
Protein, ur: NEGATIVE mg/dL
Specific Gravity, Urine: 1.021 (ref 1.005–1.030)
pH: 6 (ref 5.0–8.0)

## 2022-09-02 LAB — CBC WITH DIFFERENTIAL/PLATELET
Abs Immature Granulocytes: 0.03 10*3/uL (ref 0.00–0.07)
Basophils Absolute: 0 10*3/uL (ref 0.0–0.1)
Basophils Relative: 0 %
Eosinophils Absolute: 0 10*3/uL (ref 0.0–0.5)
Eosinophils Relative: 0 %
HCT: 45.1 % (ref 36.0–46.0)
Hemoglobin: 14.9 g/dL (ref 12.0–15.0)
Immature Granulocytes: 0 %
Lymphocytes Relative: 27 %
Lymphs Abs: 2.1 10*3/uL (ref 0.7–4.0)
MCH: 27.1 pg (ref 26.0–34.0)
MCHC: 33 g/dL (ref 30.0–36.0)
MCV: 82 fL (ref 80.0–100.0)
Monocytes Absolute: 0.4 10*3/uL (ref 0.1–1.0)
Monocytes Relative: 4 %
Neutro Abs: 5.4 10*3/uL (ref 1.7–7.7)
Neutrophils Relative %: 69 %
Platelets: 251 10*3/uL (ref 150–400)
RBC: 5.5 MIL/uL — ABNORMAL HIGH (ref 3.87–5.11)
RDW: 13.5 % (ref 11.5–15.5)
WBC: 8 10*3/uL (ref 4.0–10.5)
nRBC: 0 % (ref 0.0–0.2)

## 2022-09-02 LAB — COMPREHENSIVE METABOLIC PANEL
ALT: 19 U/L (ref 0–44)
AST: 15 U/L (ref 15–41)
Albumin: 3.7 g/dL (ref 3.5–5.0)
Alkaline Phosphatase: 62 U/L (ref 38–126)
Anion gap: 7 (ref 5–15)
BUN: 6 mg/dL (ref 6–20)
CO2: 23 mmol/L (ref 22–32)
Calcium: 9 mg/dL (ref 8.9–10.3)
Chloride: 110 mmol/L (ref 98–111)
Creatinine, Ser: 0.5 mg/dL (ref 0.44–1.00)
GFR, Estimated: >60 mL/min (ref >60)
Glucose, Bld: 148 mg/dL — ABNORMAL HIGH (ref 70–99)
Potassium: 3.6 mmol/L (ref 3.5–5.1)
Sodium: 140 mmol/L (ref 135–145)
Total Bilirubin: 0.8 mg/dL (ref 0.3–1.2)
Total Protein: 7.3 g/dL (ref 6.5–8.1)

## 2022-09-02 LAB — HCG, QUANTITATIVE, PREGNANCY: hCG, Beta Chain, Quant, S: <1 m[IU]/mL (ref <5)

## 2022-09-02 LAB — SEDIMENTATION RATE: Sed Rate: 5 mm/hr (ref 0–22)

## 2022-09-02 LAB — LIPASE, BLOOD: Lipase: 44 U/L (ref 11–51)

## 2022-09-02 MED ORDER — METOCLOPRAMIDE HCL 10 MG PO TABS
10.0000 mg | ORAL_TABLET | Freq: Four times a day (QID) | ORAL | 0 refills | Status: DC | PRN
  Filled 2022-09-02: qty 30, 8d supply, fill #0

## 2022-09-02 MED ORDER — DIPHENHYDRAMINE HCL 50 MG/ML IJ SOLN
12.5000 mg | Freq: Once | INTRAMUSCULAR | Status: AC
  Administered 2022-09-02: 12.5 mg via INTRAVENOUS
  Filled 2022-09-02: qty 1

## 2022-09-02 MED ORDER — ONDANSETRON HCL 4 MG/2ML IJ SOLN
4.0000 mg | Freq: Once | INTRAMUSCULAR | Status: AC
  Administered 2022-09-02: 4 mg via INTRAVENOUS
  Filled 2022-09-02: qty 2

## 2022-09-02 MED ORDER — METOCLOPRAMIDE HCL 5 MG/ML IJ SOLN
10.0000 mg | Freq: Once | INTRAMUSCULAR | Status: AC
  Administered 2022-09-02: 10 mg via INTRAVENOUS
  Filled 2022-09-02: qty 2

## 2022-09-02 MED ORDER — LACTATED RINGERS IV BOLUS
1000.0000 mL | Freq: Once | INTRAVENOUS | Status: AC
  Administered 2022-09-02: 1000 mL via INTRAVENOUS

## 2022-09-02 MED ORDER — MORPHINE SULFATE (PF) 4 MG/ML IV SOLN
4.0000 mg | Freq: Once | INTRAVENOUS | Status: AC
  Administered 2022-09-02: 4 mg via INTRAVENOUS
  Filled 2022-09-02: qty 1

## 2022-09-02 MED ORDER — FAMOTIDINE IN NACL 20-0.9 MG/50ML-% IV SOLN
20.0000 mg | Freq: Once | INTRAVENOUS | Status: AC
  Administered 2022-09-02: 20 mg via INTRAVENOUS
  Filled 2022-09-02: qty 50

## 2022-09-02 MED ORDER — DICYCLOMINE HCL 20 MG PO TABS
20.0000 mg | ORAL_TABLET | Freq: Two times a day (BID) | ORAL | 0 refills | Status: DC | PRN
  Filled 2022-09-02: qty 20, 10d supply, fill #0

## 2022-09-02 MED ORDER — IOHEXOL 300 MG/ML  SOLN
100.0000 mL | Freq: Once | INTRAMUSCULAR | Status: AC | PRN
  Administered 2022-09-02: 100 mL via INTRAVENOUS

## 2022-09-02 NOTE — ED Triage Notes (Signed)
Pt arrived via POV, c/o left sided abd pain, nausea, denies any vomiting or diarrhea.

## 2022-09-02 NOTE — ED Notes (Signed)
An After Visit Summary was printed and given to the patient. Discharge instructions given and no further questions at this time.  

## 2022-09-02 NOTE — ED Provider Notes (Signed)
[facility] DEPT Provider Note   CSN: [id number] Arrival date & time: 09/02/22  1025     History  Chief Complaint  Patient presents with   Abdominal Pain    Cindy Long is a 46 y.o. female.  With PMH of Crohn's disease on Stelara, HTN, HLD, GERD who presents with abdominal pain and nausea.  Patient says with her Crohn's disease she is normally having loose bowel movements daily.  However she started developing pain last night in her left side of her abdomen that is nonradiating associate with nausea and feeling like she needs to have bowel movements.  She tried to have a bowel movement but only had very small hard stool.  She has had nausea but no vomiting.  She denies any fevers, chest pain, shortness of breath, URI symptoms or any UTI symptoms.  When she has Crohn's flare she typically has a lot of vomiting and diarrhea so this feels different.  The only abdominal surgery she has had is cholecystectomy.   Abdominal Pain      Home Medications Prior to Admission medications   Medication Sig Start Date End Date Taking? Authorizing Provider  dicyclomine (BENTYL) 20 MG tablet Take 1 tablet (20 mg total) by mouth 2 (two) times daily as needed (abdominal cramping). 09/02/22  Yes Elgie Congo, MD  metoCLOPramide (REGLAN) 10 MG tablet Take 1 tablet (10 mg total) by mouth every 6 (six) hours as needed for nausea (abdominal pain). 09/02/22  Yes Elgie Congo, MD  acetaminophen (TYLENOL) 500 MG tablet Take 1,000 mg by mouth every 6 (six) hours as needed for moderate pain or headache.    [provider]  albuterol (PROVENTIL HFA;VENTOLIN HFA) 108 (90 Base) MCG/ACT inhaler Inhale 1-2 puffs into the lungs every 6 (six) hours as needed for wheezing or shortness of breath.    [provider]  aspirin EC 81 MG tablet Take 1 tablet (81 mg total) by mouth daily. Swallow whole. 01/17/22   Marylu Lund., NP  atorvastatin (LIPITOR) 20  MG tablet Take 1 tablet by mouth daily. 01/17/22 01/12/23  Marylu Lund., NP  Bacillus Coagulans-Inulin (PROBIOTIC) 1-250 BILLION-MG CAPS Take 1 capsule by mouth daily.    [provider]  Continuous Blood Gluc Sensor (DEXCOM G7 SENSOR) MISC Change sensor every 10 days 04/26/22     cyanocobalamin (VITAMIN B12) 1000 MCG/ML injection Inject 1 ml subcutaneously once daily for 1 week-- then inject weekly for 1 month-- then inject once every 2 weeks 12/01/21   Plotnikov, Evie Lacks, MD  dapagliflozin propanediol (FARXIGA) 10 MG TABS tablet Take 1 tablet by mouth once a day 07/13/21     escitalopram (LEXAPRO) 20 MG tablet TAKE 2 TABLETS BY MOUTH ONCE A DAY. 11/25/21     hyoscyamine (LEVBID) 0.375 MG 12 hr tablet Take 1 tablet  by mouth as needed as directed (30 days). Patient taking differently: Take 0.375 mg by mouth daily as needed for cramping. 11/25/21   Ronnette Juniper, MD  Insulin Pen Needle 32G X 4 MM MISC by Does not apply route. BD U/F Pen Needles Nano 4 mm x 32 G; use with insulin    [provider]  insulin regular human CONCENTRATED (HUMULIN R U-500 KWIKPEN) 500 UNIT/ML KwikPen Inject 5-50 Units into the skin 3 (three) times daily with meals. Take 10 units in the morning, 45 units at lunch, 25 units at dinner. 03/06/22   Eugenie Filler, MD  insulin regular  human CONCENTRATED (HUMULIN R U-500 KWIKPEN) 500 UNIT/ML KwikPen Inject 10 units into the skin at breakfast, 40 units at lunch, 20 units at evening meal 05/27/22     LORazepam (ATIVAN) 1 MG tablet Take 1/2 tablet by mouth once daily as needed Patient taking differently: Take 0.5 mg by mouth daily as needed for anxiety. 11/25/21     losartan (COZAAR) 50 MG tablet Take 50 mg by mouth daily. 10/18/19   [provider]  medroxyPROGESTERone Acetate 150 MG/ML SUSY Inject 1 syringe into the muscle every 11-12 weeks 05/25/22     metoprolol succinate (TOPROL-XL) 25 MG 24 hr tablet Take 1 tablet (25 mg total) by mouth 2 (two) times  daily. 06/08/22   Plotnikov, Evie Lacks, MD  nystatin-triamcinolone (MYCOLOG II) cream APPLY TO THE AFFECTED AREA(S) TWO TIMES DAILY AS DIRECTED Patient taking differently: Apply 1 application  topically 2 (two) times daily as needed (rash). 09/25/21     promethazine (PHENERGAN) 25 MG tablet TAKE 1 TABLET BY MOUTH EVERY 8 HOURS Patient taking differently: Take 25 mg by mouth every 8 (eight) hours as needed for vomiting or nausea. 11/25/21     repaglinide (PRANDIN) 1 MG tablet Take 1 tablet (1 mg total) by mouth 3 (three) times daily before meals. 12/19/21   Plotnikov, Evie Lacks, MD  Semaglutide,0.25 or 0.5MG/DOS, (OZEMPIC, 0.25 OR 0.5 MG/DOSE,) 2 MG/3ML SOPN Inject 0.25 mg into the skin once a week for four weeks, then 0.5 mg once a week Patient taking differently: Inject 0.5 mg into the skin every Tuesday. 01/18/22     spironolactone (ALDACTONE) 50 MG tablet Take 1 tablet by mouth every day. Patient taking differently: Take 50 mg by mouth daily. 11/30/21     SYRINGE-NEEDLE, DISP, 3 ML 25G X 5/8" 3 ML MISC Use to inject B12 under the skin 12/01/21   Plotnikov, Evie Lacks, MD  ustekinumab (STELARA) 130 MG/26ML SOLN injection Infuse 3 vials IV over at least 1 hour Patient not taking: Reported on 07/29/2022 03/16/22   Tresa Garter, MD  ustekinumab (STELARA) 90 MG/ML SOSY injection Inject 90 mg into the skin every 8 weeks after initial IV infusion, then every 8 weeks thereafter 03/16/22   Tresa Garter, MD  zolpidem (AMBIEN) 10 MG tablet Take 1/2-1 tablet (5-10 mg total) by mouth at bedtime as needed for sleep. 04/18/22   Plotnikov, Evie Lacks, MD      Allergies    Metronidazole, Shellfish allergy, Empagliflozin-linagliptin, Lisinopril, Metformin hcl, Prednisone, Dulaglutide, and Liraglutide    Review of Systems   Review of Systems  Gastrointestinal:  Positive for abdominal pain.    Physical Exam Updated Vital Signs BP 106/69   Pulse 81   Temp 97.6 F (36.4 C) (Oral)   Resp 18   SpO2 99%   Physical Exam Constitutional: Alert and oriented.  Slightly uncomfortable in appearance but nontoxic  eyes: Conjunctivae are normal. ENT      Head: Normocephalic and atraumatic.      Nose: No congestion.      Mouth/Throat: Mucous membranes are moist.      Neck: No stridor. Cardiovascular: S1, S2, regular rate and rhythm Respiratory: Normal respiratory effort.  O2 sat 98 on RA  gastrointestinal: Soft and nondistended with mild epigastrium and left upper quadrant and left mid abdominal tenderness to palpation, no rebound or guarding, no CVA TTP Musculoskeletal: Normal range of motion in all extremities. Neurologic: Normal speech and language. No gross focal neurologic deficits are appreciated. Skin: Skin is warm,  dry and intact. No rash noted. Psychiatric: Mood and affect are normal. Speech and behavior are normal.  ED Results / Procedures / Treatments   Labs (all labs ordered are listed, but only abnormal results are displayed) Labs Reviewed  COMPREHENSIVE METABOLIC PANEL - Abnormal; Notable for the following components:      Result Value   Glucose, Bld 148 (*)    All other components within normal limits  CBC WITH DIFFERENTIAL/PLATELET - Abnormal; Notable for the following components:   RBC 5.50 (*)    All other components within normal limits  URINALYSIS, ROUTINE W REFLEX MICROSCOPIC - Abnormal; Notable for the following components:   APPearance HAZY (*)    Glucose, UA >=500 (*)    Bacteria, UA RARE (*)    All other components within normal limits  LIPASE, BLOOD  SEDIMENTATION RATE  C-REACTIVE PROTEIN  HCG, QUANTITATIVE, PREGNANCY    EKG None  Radiology CT ABDOMEN PELVIS W CONTRAST  Result Date: 09/02/2022 CLINICAL DATA:  Crohn's disease with abdominal pain. Left-sided abdominal pain. EXAM: CT ABDOMEN AND PELVIS WITH CONTRAST TECHNIQUE: Multidetector CT imaging of the abdomen and pelvis was performed using the standard protocol following bolus administration of  intravenous contrast. RADIATION DOSE REDUCTION: This exam was performed according to the departmental dose-optimization program which includes automated exposure control, adjustment of the mA and/or kV according to patient size and/or use of iterative reconstruction technique. CONTRAST:  151m OMNIPAQUE IOHEXOL 300 MG/ML  SOLN COMPARISON:  CT examination dated June 28, 2022 FINDINGS: Lower chest: No acute abnormality. Hepatobiliary: No focal liver abnormality is seen. Status post cholecystectomy. No biliary dilatation. Pancreas: Unremarkable. No pancreatic ductal dilatation or surrounding inflammatory changes. Spleen: Normal in size without focal abnormality. Adrenals/Urinary Tract: Adrenal glands are unremarkable. Kidneys are normal, without renal calculi, focal lesion, or hydronephrosis. Bladder is unremarkable. Stomach/Bowel: Stomach is within normal limits. Appendix appears normal. Colonic diverticulosis without evidence of acute diverticulitis. No bowel wall thickening or inflammatory changes. Vascular/Lymphatic: No significant vascular findings are present. No enlarged abdominal or pelvic lymph nodes. Reproductive: Uterus and bilateral adnexa are unremarkable. Other: No abdominal wall hernia or abnormality. No abdominopelvic ascites. Musculoskeletal: No acute or significant osseous findings. IMPRESSION: 1. No CT evidence of acute abdominal/pelvic process. 2. Colonic diverticulosis without evidence of acute diverticulitis. 3. Status post cholecystectomy. Electronically Signed   By: IKeane PoliceD.O.   On: 09/02/2022 12:50    Procedures Procedures    Medications Ordered in ED Medications  lactated ringers bolus 1,000 mL (0 mLs Intravenous Stopped 09/02/22 1323)  ondansetron (ZOFRAN) injection 4 mg (4 mg Intravenous Given 09/02/22 1138)  morphine (PF) 4 MG/ML injection 4 mg (4 mg Intravenous Given 09/02/22 1203)  famotidine (PEPCID) IVPB 20 mg premix (0 mg Intravenous Stopped 09/02/22 1323)  iohexol  (OMNIPAQUE) 300 MG/ML solution 100 mL (100 mLs Intravenous Contrast Given 09/02/22 1214)  metoCLOPramide (REGLAN) injection 10 mg (10 mg Intravenous Given 09/02/22 1324)  diphenhydrAMINE (BENADRYL) injection 12.5 mg (12.5 mg Intravenous Given 09/02/22 1324)    ED Course/ Medical Decision Making/ A&P Clinical Course as of 09/02/22 1434  Fri Sep 02, 2022  1315 Patient's lab work all generally unremarkable with normal white blood cell count 8.0 and normal ESR 5 unlikely to be Crohn's flare.  Normal creatinine 0.5.  Mildly elevated glucose 148.  No acute electrolyte abnormalities.  UA negative for UTI.  CTAP obtained with no evidence of Crohn's flare, no bowel obstruction, no acute intra-abdominal pathology read by radiology and also reviewed by  myself with no pathology.  Rechecked on patient, she is still feeling some discomfort.  I explained there were no concerning findings and unclear etiology of pain, trialing Reglan for possible diabetic gastroparesis and benadryl. [VB]  1432 Patient feeling better after Reglan and Benadryl discharging with as needed Reglan and Bentyl and advised follow-up with GI doctor, she is tolerating p.o. and feels in agreement with plan to discharge and follow-up outpatient with strict return precautions discussed. [VB]    Clinical Course User Index [VB] Elgie Congo, MD                           Medical Decision Making  LORELIE BIERMANN is a 46 y.o. female.  With PMH of Crohn's disease on Stelara, HTN, HLD, GERD who presents with abdominal pain and nausea.    Based on the patient's location of pain, differential includes but is not limited to GERD, PUD, pancreatitis, constipation, colitis, Crohn's flare. Less likely nephrolithiasis or pyelonephritis with no CVAT and no urinary symptoms. Less likely pulmonary source such as pneumonia with no cardiopulmonary complaints, no hypoxia, and no increased work of breathing. No concern for atypical ACS with no risk  factors and no cardiopulmonary complaints.   Labs obtained and reviewed with normal white blood count 8.0 no anemia 14.9.  Normal lipase 44 unlikely pancreatitis.  Glucose slightly elevated 148.  No acute electrolyte abnormalities.  CTAP obtained with contrast of the abdomen which I personally reviewed which showed no acute findings, no obstruction, no evidence of diverticulitis or colitis or Crohn's flare.  Patient given morphine, Zofran Reglan and Benadryl.  She felt much better and tolerating po after Reglan.  Suspect possible component of diabetic gastroparesis.  Will discharge with as needed Reglan and Bentyl and advised close follow-up with GI doctor and strict return precautions.  She is in agreement with plans for discharge home.   Amount and/or Complexity of Data Reviewed Labs: ordered. Radiology: ordered.  Risk Prescription drug management.    Final Clinical Impression(s) / ED Diagnoses Final diagnoses:  Abdominal pain, unspecified abdominal location    Rx / DC Orders ED Discharge Orders          Ordered    metoCLOPramide (REGLAN) 10 MG tablet  Every 6 hours PRN        09/02/22 1433    dicyclomine (BENTYL) 20 MG tablet  2 times daily PRN        09/02/22 1433              Elgie Congo, MD 09/02/22 1435

## 2022-09-02 NOTE — Discharge Instructions (Signed)
  You have been seen in the Emergency Department (ED) for abdominal pain. Your workup was generally reassuring; however, it did not identify a clear cause of your symptoms.  You can continue taking the Reglan as needed for nausea and abdominal pain as well as the Bentyl prescribed.  You can also take Tylenol and ibuprofen as needed.  Please follow up with your primary care doctor and GI doctor as soon as possible regarding today's ED visit and the symptoms that are bothering you.  Return to the ED if your abdominal pain worsens or fails to improve, you develop bloody vomiting, bloody diarrhea, you are unable to tolerate fluids due to vomiting, fever greater than 101, or any other concerning symptoms.

## 2022-09-20 ENCOUNTER — Other Ambulatory Visit (HOSPITAL_COMMUNITY): Payer: Self-pay

## 2022-09-21 ENCOUNTER — Other Ambulatory Visit (HOSPITAL_COMMUNITY): Payer: Self-pay

## 2022-09-26 ENCOUNTER — Other Ambulatory Visit (HOSPITAL_COMMUNITY): Payer: Self-pay

## 2022-09-27 ENCOUNTER — Other Ambulatory Visit (HOSPITAL_COMMUNITY): Payer: Self-pay

## 2022-09-28 DIAGNOSIS — Z3042 Encounter for surveillance of injectable contraceptive: Secondary | ICD-10-CM | POA: Diagnosis not present

## 2022-10-04 ENCOUNTER — Other Ambulatory Visit (HOSPITAL_COMMUNITY): Payer: Self-pay

## 2022-10-04 ENCOUNTER — Encounter: Payer: Self-pay | Admitting: Internal Medicine

## 2022-10-04 MED ORDER — DAPAGLIFLOZIN PROPANEDIOL 10 MG PO TABS
10.0000 mg | ORAL_TABLET | Freq: Every day | ORAL | 5 refills | Status: DC
  Filled 2022-10-04: qty 30, 30d supply, fill #0
  Filled 2022-11-19: qty 30, 30d supply, fill #1
  Filled 2023-01-06: qty 30, 30d supply, fill #2
  Filled 2023-02-17: qty 30, 30d supply, fill #3

## 2022-10-05 ENCOUNTER — Other Ambulatory Visit (HOSPITAL_COMMUNITY): Payer: Self-pay

## 2022-10-06 ENCOUNTER — Other Ambulatory Visit (HOSPITAL_COMMUNITY): Payer: Self-pay

## 2022-10-07 ENCOUNTER — Encounter (HOSPITAL_COMMUNITY)
Admission: RE | Admit: 2022-10-07 | Discharge: 2022-10-07 | Disposition: A | Discharge status: Home / Self Care | Payer: 59 | Source: Ambulatory Visit | Attending: Gastroenterology | Admitting: Gastroenterology

## 2022-10-07 DIAGNOSIS — K50818 Crohn's disease of both small and large intestine with other complication: Secondary | ICD-10-CM | POA: Diagnosis not present

## 2022-10-07 MED ORDER — SODIUM CHLORIDE 0.9 % IV SOLN
5.0000 mg/kg | Freq: Once | INTRAVENOUS | Status: AC
  Administered 2022-10-07: 400 mg via INTRAVENOUS
  Filled 2022-10-07: qty 40

## 2022-10-21 ENCOUNTER — Encounter (HOSPITAL_COMMUNITY)
Admission: RE | Admit: 2022-10-21 | Discharge: 2022-10-21 | Disposition: A | Discharge status: Home / Self Care | Payer: 59 | Source: Ambulatory Visit | Attending: Gastroenterology | Admitting: Gastroenterology

## 2022-10-21 DIAGNOSIS — K50818 Crohn's disease of both small and large intestine with other complication: Secondary | ICD-10-CM | POA: Diagnosis not present

## 2022-10-21 MED ORDER — SODIUM CHLORIDE 0.9 % IV SOLN
5.0000 mg/kg | Freq: Once | INTRAVENOUS | Status: AC
  Administered 2022-10-21: 400 mg via INTRAVENOUS
  Filled 2022-10-21: qty 40

## 2022-10-28 DIAGNOSIS — K5010 Crohn's disease of large intestine without complications: Secondary | ICD-10-CM | POA: Diagnosis not present

## 2022-10-31 ENCOUNTER — Emergency Department (HOSPITAL_COMMUNITY)
Admission: EM | Admit: 2022-10-31 | Discharge: 2022-10-31 | Disposition: A | Discharge status: Home / Self Care | Payer: 59 | Attending: Emergency Medicine | Admitting: Emergency Medicine

## 2022-10-31 ENCOUNTER — Other Ambulatory Visit: Payer: Self-pay

## 2022-10-31 ENCOUNTER — Encounter (HOSPITAL_COMMUNITY): Payer: Self-pay | Admitting: *Deleted

## 2022-10-31 DIAGNOSIS — Z7984 Long term (current) use of oral hypoglycemic drugs: Secondary | ICD-10-CM | POA: Diagnosis not present

## 2022-10-31 DIAGNOSIS — Z7982 Long term (current) use of aspirin: Secondary | ICD-10-CM | POA: Insufficient documentation

## 2022-10-31 DIAGNOSIS — R112 Nausea with vomiting, unspecified: Secondary | ICD-10-CM

## 2022-10-31 DIAGNOSIS — U071 COVID-19: Secondary | ICD-10-CM | POA: Insufficient documentation

## 2022-10-31 DIAGNOSIS — Z794 Long term (current) use of insulin: Secondary | ICD-10-CM | POA: Insufficient documentation

## 2022-10-31 DIAGNOSIS — E119 Type 2 diabetes mellitus without complications: Secondary | ICD-10-CM | POA: Insufficient documentation

## 2022-10-31 DIAGNOSIS — I10 Essential (primary) hypertension: Secondary | ICD-10-CM | POA: Diagnosis not present

## 2022-10-31 DIAGNOSIS — R059 Cough, unspecified: Secondary | ICD-10-CM | POA: Diagnosis present

## 2022-10-31 DIAGNOSIS — Z79899 Other long term (current) drug therapy: Secondary | ICD-10-CM | POA: Insufficient documentation

## 2022-10-31 LAB — RESP PANEL BY RT-PCR (RSV, FLU A&B, COVID)  RVPGX2
Influenza A by PCR: NEGATIVE
Influenza B by PCR: NEGATIVE
Resp Syncytial Virus by PCR: NEGATIVE
SARS Coronavirus 2 by RT PCR: POSITIVE — AB

## 2022-10-31 MED ORDER — ONDANSETRON 8 MG PO TBDP
8.0000 mg | ORAL_TABLET | Freq: Once | ORAL | Status: AC
  Administered 2022-10-31: 8 mg via ORAL
  Filled 2022-10-31: qty 1

## 2022-10-31 MED ORDER — ONDANSETRON HCL 4 MG PO TABS
4.0000 mg | ORAL_TABLET | Freq: Three times a day (TID) | ORAL | 0 refills | Status: DC | PRN
  Filled 2022-10-31 – 2022-11-01 (×2): qty 20, 7d supply, fill #0

## 2022-10-31 MED ORDER — NIRMATRELVIR/RITONAVIR (PAXLOVID)TABLET
3.0000 | ORAL_TABLET | Freq: Two times a day (BID) | ORAL | 0 refills | Status: AC
  Filled 2022-10-31 – 2022-11-01 (×2): qty 30, 5d supply, fill #0

## 2022-10-31 NOTE — ED Provider Notes (Signed)
[facility] EMERGENCY DEPARTMENT Provider Note   CSN: [id number] Arrival date & time: 10/31/22  1952     History  No chief complaint on file.   Cindy Long is a 46 y.o. female.  Patient presents emergency department complaining of cough, sore throat, vomiting, diarrhea, body aches, since Friday.  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  Past medical history significant for type 2 diabetes, IBS, hypertension, Crohn's disease  HPI     Home Medications Prior to Admission medications   Medication Sig Start Date End Date Taking? Authorizing Provider  nirmatrelvir/ritonavir (PAXLOVID) 20 x 150 MG & 10 x 100MG TABS Take 3 tablets by mouth 2 (two) times daily for 5 days. Patient GFR is >60. Take nirmatrelvir (150 mg) two tablets twice daily for 5 days and ritonavir (100 mg) one tablet twice daily for 5 days. 10/31/22 11/05/22 Yes Cherlynn June B, PA-C  ondansetron (ZOFRAN) 4 MG tablet Take 1 tablet (4 mg total) by mouth every 8 (eight) hours as needed for nausea or vomiting. 10/31/22  Yes Dorothyann Peng, PA-C  acetaminophen (TYLENOL) 500 MG tablet Take 1,000 mg by mouth every 6 (six) hours as needed for moderate pain or headache.    [provider]  albuterol (PROVENTIL HFA;VENTOLIN HFA) 108 (90 Base) MCG/ACT inhaler Inhale 1-2 puffs into the lungs every 6 (six) hours as needed for wheezing or shortness of breath.    [provider]  aspirin EC 81 MG tablet Take 1 tablet (81 mg total) by mouth daily. Swallow whole. 01/17/22   Marylu Lund., NP  atorvastatin (LIPITOR) 20 MG tablet Take 1 tablet by mouth daily. 01/17/22 01/12/23  Marylu Lund., NP  Bacillus Coagulans-Inulin (PROBIOTIC) 1-250 BILLION-MG CAPS Take 1 capsule by mouth daily.    [provider]  Continuous Blood Gluc Sensor (DEXCOM G7 SENSOR) MISC Change sensor every 10 days 04/26/22     cyanocobalamin (VITAMIN B12) 1000 MCG/ML injection Inject 1 ml subcutaneously once daily for 1 week-- then  inject weekly for 1 month-- then inject once every 2 weeks 12/01/21   Plotnikov, Evie Lacks, MD  dapagliflozin propanediol (FARXIGA) 10 MG TABS tablet Take 1 tablet (10 mg total) by mouth daily. 10/04/22   Plotnikov, Evie Lacks, MD  dicyclomine (BENTYL) 20 MG tablet Take 1 tablet (20 mg total) by mouth 2 (two) times daily as needed for abdominal cramping. 09/02/22   Elgie Congo, MD  escitalopram (LEXAPRO) 20 MG tablet TAKE 2 TABLETS BY MOUTH ONCE A DAY. 11/25/21     hyoscyamine (LEVBID) 0.375 MG 12 hr tablet Take 1 tablet  by mouth as needed as directed (30 days). Patient taking differently: Take 0.375 mg by mouth daily as needed for cramping. 11/25/21   Ronnette Juniper, MD  Insulin Pen Needle 32G X 4 MM MISC by Does not apply route. BD U/F Pen Needles Nano 4 mm x 32 G; use with insulin    [provider]  insulin regular human CONCENTRATED (HUMULIN R U-500 KWIKPEN) 500 UNIT/ML KwikPen Inject 5-50 Units into the skin 3 (three) times daily with meals. Take 10 units in the morning, 45 units at lunch, 25 units at dinner. 03/06/22   Eugenie Filler, MD  insulin regular human CONCENTRATED (HUMULIN R U-500 KWIKPEN) 500 UNIT/ML KwikPen Inject 10 units into the skin at breakfast, 40 units at lunch, 20 units at evening meal 05/27/22     LORazepam (ATIVAN) 1 MG tablet Take 1/2 tablet by mouth once  daily as needed Patient taking differently: Take 0.5 mg by mouth daily as needed for anxiety. 11/25/21     losartan (COZAAR) 50 MG tablet Take 50 mg by mouth daily. 10/18/19   [provider]  medroxyPROGESTERone Acetate 150 MG/ML SUSY Inject 1 syringe into the muscle every 11-12 weeks 05/25/22     metoCLOPramide (REGLAN) 10 MG tablet Take 1 tablet (10 mg total) by mouth every 6 (six) hours as needed for nausea and abdominal pain. 09/02/22   Elgie Congo, MD  metoprolol succinate (TOPROL-XL) 25 MG 24 hr tablet Take 1 tablet (25 mg total) by mouth 2 (two) times daily. 06/08/22   Plotnikov, Evie Lacks, MD  nystatin-triamcinolone (MYCOLOG II) cream APPLY TO THE AFFECTED AREA(S) TWO TIMES DAILY AS DIRECTED Patient taking differently: Apply 1 application  topically 2 (two) times daily as needed (rash). 09/25/21     promethazine (PHENERGAN) 25 MG tablet TAKE 1 TABLET BY MOUTH EVERY 8 HOURS Patient taking differently: Take 25 mg by mouth every 8 (eight) hours as needed for vomiting or nausea. 11/25/21     repaglinide (PRANDIN) 1 MG tablet Take 1 tablet (1 mg total) by mouth 3 (three) times daily before meals. 12/19/21   Plotnikov, Evie Lacks, MD  Semaglutide,0.25 or 0.5MG/DOS, (OZEMPIC, 0.25 OR 0.5 MG/DOSE,) 2 MG/3ML SOPN Inject 0.25 mg into the skin once a week for four weeks, then 0.5 mg once a week Patient taking differently: Inject 0.5 mg into the skin every Tuesday. 01/18/22     spironolactone (ALDACTONE) 50 MG tablet Take 1 tablet by mouth every day. Patient taking differently: Take 50 mg by mouth daily. 11/30/21     SYRINGE-NEEDLE, DISP, 3 ML 25G X 5/8" 3 ML MISC Use to inject B12 under the skin 12/01/21   Plotnikov, Evie Lacks, MD  ustekinumab (STELARA) 130 MG/26ML SOLN injection Infuse 3 vials IV over at least 1 hour Patient not taking: Reported on 07/29/2022 03/16/22   Tresa Garter, MD  ustekinumab (STELARA) 90 MG/ML SOSY injection Inject 90 mg into the skin every 8 weeks after initial IV infusion, then every 8 weeks thereafter 03/16/22   Tresa Garter, MD  zolpidem (AMBIEN) 10 MG tablet Take 1/2-1 tablet (5-10 mg total) by mouth at bedtime as needed for sleep. 04/18/22   Plotnikov, Evie Lacks, MD      Allergies    Metronidazole, Shellfish allergy, Empagliflozin-linagliptin, Lisinopril, Metformin hcl, Prednisone, Dulaglutide, and Liraglutide    Review of Systems   Review of Systems  Constitutional:  Positive for fever.  Respiratory:  Positive for cough. Negative for shortness of breath.   Cardiovascular:  Negative for chest pain.  Gastrointestinal:  Positive for diarrhea, nausea  and vomiting.  Genitourinary:  Negative for dysuria.  Musculoskeletal:  Positive for myalgias.    Physical Exam Updated Vital Signs BP (!) 127/91 (BP Location: Left Arm)   Pulse (!) 103   Temp 98.7 F (37.1 C) (Oral)   Resp 18   Ht 5' 6"  (1.676 m)   Wt 71.7 kg   SpO2 95%   BMI 25.50 kg/m  Physical Exam Vitals and nursing note reviewed.  Constitutional:      General: She is not in acute distress.    Appearance: She is well-developed.  HENT:     Head: Normocephalic and atraumatic.     Mouth/Throat:     Mouth: Mucous membranes are moist.  Eyes:     Conjunctiva/sclera: Conjunctivae normal.  Cardiovascular:     Rate and  Rhythm: Normal rate and regular rhythm.     Heart sounds: No murmur heard. Pulmonary:     Effort: Pulmonary effort is normal. No respiratory distress.     Breath sounds: Normal breath sounds.  Abdominal:     Palpations: Abdomen is soft.     Tenderness: There is no abdominal tenderness.  Musculoskeletal:        General: No swelling.     Cervical back: Neck supple.  Skin:    General: Skin is warm and dry.     Capillary Refill: Capillary refill takes less than 2 seconds.  Neurological:     Mental Status: She is alert.  Psychiatric:        Mood and Affect: Mood normal.     ED Results / Procedures / Treatments   Labs (all labs ordered are listed, but only abnormal results are displayed) Labs Reviewed  RESP PANEL BY RT-PCR (RSV, FLU A&B, COVID)  [id number] - Abnormal; Notable for the following components:      Result Value   SARS Coronavirus 2 by RT PCR POSITIVE (*)    All other components within normal limits    EKG None  Radiology No results found.  Procedures Procedures    Medications Ordered in ED Medications  ondansetron (ZOFRAN-ODT) disintegrating tablet 8 mg (8 mg Oral Given 10/31/22 2304)    ED Course/ Medical Decision Making/ A&P                           Medical Decision Making Risk Prescription drug management.   Patient  presents with chief complaints of nausea, vomiting, cough, body aches, chills, fever.  Differential diagnosis includes but is not limited to COVID-19, influenza, RSV, other viral illnesses, other  Patient's comorbidities include diabetes, IBS  I reviewed the patient's past medical history.  Patient was recently seen for abdominal pain on October 27 of this year in the emergency department  I ordered and reviewed labs.  Pertinent results include positive COVID-19 test result  I ordered the patient Zofran for nausea.  Upon reassessment patient was feeling better.    Patient with new COVID-19 diagnosis.  Plan to discharge patient home with Paxlovid.  I was able to check patient's GFR from the end of October.  Patient's GFR was greater than 60.  The patient has no known kidney disease.  Paxlovid prescribed with no dose adjustment.  I will also prescribe Zofran for patient's nausea.  Work note provided.        Final Clinical Impression(s) / ED Diagnoses Final diagnoses:  COVID-19  Nausea and vomiting, unspecified vomiting type    Rx / DC Orders ED Discharge Orders          Ordered    nirmatrelvir/ritonavir (PAXLOVID) 20 x 150 MG & 10 x 100MG TABS  2 times daily        10/31/22 2303    ondansetron (ZOFRAN) 4 MG tablet  Every 8 hours PRN        10/31/22 2303              Ronny Bacon 10/31/22 2305    Sherwood Gambler, MD 11/03/22 [phone]

## 2022-10-31 NOTE — ED Triage Notes (Signed)
Pt c/o productive cough, sore throat, vomiting, diarrhea, body aches, nauseated, high blood sugar since Friday.

## 2022-11-01 ENCOUNTER — Other Ambulatory Visit: Payer: Self-pay

## 2022-11-01 ENCOUNTER — Other Ambulatory Visit (HOSPITAL_COMMUNITY): Payer: Self-pay

## 2022-11-04 DIAGNOSIS — E1165 Type 2 diabetes mellitus with hyperglycemia: Secondary | ICD-10-CM | POA: Diagnosis not present

## 2022-11-04 DIAGNOSIS — I10 Essential (primary) hypertension: Secondary | ICD-10-CM | POA: Diagnosis not present

## 2022-11-04 DIAGNOSIS — Z87898 Personal history of other specified conditions: Secondary | ICD-10-CM | POA: Diagnosis not present

## 2022-11-04 DIAGNOSIS — Z794 Long term (current) use of insulin: Secondary | ICD-10-CM | POA: Diagnosis not present

## 2022-11-04 DIAGNOSIS — I2510 Atherosclerotic heart disease of native coronary artery without angina pectoris: Secondary | ICD-10-CM | POA: Diagnosis not present

## 2022-11-06 DIAGNOSIS — Z760 Encounter for issue of repeat prescription: Secondary | ICD-10-CM | POA: Diagnosis not present

## 2022-11-18 ENCOUNTER — Encounter (HOSPITAL_COMMUNITY)
Admission: RE | Admit: 2022-11-18 | Discharge: 2022-11-18 | Disposition: A | Discharge status: Home / Self Care | Payer: Commercial Managed Care - PPO | Source: Ambulatory Visit | Attending: Gastroenterology | Admitting: Gastroenterology

## 2022-11-18 DIAGNOSIS — K50818 Crohn's disease of both small and large intestine with other complication: Secondary | ICD-10-CM | POA: Diagnosis present

## 2022-11-18 MED ORDER — SODIUM CHLORIDE 0.9 % IV SOLN
5.0000 mg/kg | Freq: Once | INTRAVENOUS | Status: DC
  Administered 2022-11-18: 400 mg via INTRAVENOUS
  Filled 2022-11-18: qty 40

## 2022-11-19 ENCOUNTER — Other Ambulatory Visit: Payer: Self-pay | Admitting: Internal Medicine

## 2022-11-19 ENCOUNTER — Other Ambulatory Visit (HOSPITAL_COMMUNITY): Payer: Self-pay

## 2022-11-19 ENCOUNTER — Other Ambulatory Visit: Payer: Self-pay

## 2022-11-21 ENCOUNTER — Other Ambulatory Visit: Payer: Self-pay | Admitting: Internal Medicine

## 2022-11-21 ENCOUNTER — Other Ambulatory Visit (HOSPITAL_COMMUNITY): Payer: Self-pay

## 2022-11-21 ENCOUNTER — Other Ambulatory Visit: Payer: Self-pay

## 2022-11-21 MED ORDER — REPAGLINIDE 1 MG PO TABS
1.0000 mg | ORAL_TABLET | Freq: Three times a day (TID) | ORAL | 1 refills | Status: DC
  Filled 2022-11-21 (×2): qty 270, 90d supply, fill #0

## 2022-11-21 MED ORDER — LOSARTAN POTASSIUM 50 MG PO TABS
50.0000 mg | ORAL_TABLET | Freq: Every day | ORAL | 1 refills | Status: DC
  Filled 2022-11-21: qty 90, 90d supply, fill #0

## 2022-11-22 ENCOUNTER — Other Ambulatory Visit (HOSPITAL_COMMUNITY): Payer: Self-pay

## 2022-11-23 ENCOUNTER — Other Ambulatory Visit (HOSPITAL_COMMUNITY): Payer: Self-pay

## 2022-11-24 ENCOUNTER — Other Ambulatory Visit (HOSPITAL_COMMUNITY): Payer: Self-pay

## 2022-11-24 ENCOUNTER — Telehealth: Payer: Self-pay | Admitting: Family Medicine

## 2022-11-24 NOTE — Telephone Encounter (Signed)
Bilateral Durolane initiated through portal.

## 2022-11-24 NOTE — Telephone Encounter (Signed)
Pt would like to repeat Durolane, bil knees.  Last done in May.  I scheduled her for 1/26 with Tamala Julian, need to run with her new [employer] on file.

## 2022-11-30 ENCOUNTER — Other Ambulatory Visit (HOSPITAL_COMMUNITY): Payer: Self-pay

## 2022-11-30 ENCOUNTER — Other Ambulatory Visit: Payer: Self-pay

## 2022-11-30 ENCOUNTER — Ambulatory Visit (INDEPENDENT_AMBULATORY_CARE_PROVIDER_SITE_OTHER): Payer: Commercial Managed Care - PPO | Admitting: Internal Medicine

## 2022-11-30 VITALS — BP 110/80 | HR 94 | Temp 98.1°F | Ht 66.0 in

## 2022-11-30 DIAGNOSIS — R42 Dizziness and giddiness: Secondary | ICD-10-CM | POA: Diagnosis not present

## 2022-11-30 DIAGNOSIS — H60391 Other infective otitis externa, right ear: Secondary | ICD-10-CM

## 2022-11-30 DIAGNOSIS — H6090 Unspecified otitis externa, unspecified ear: Secondary | ICD-10-CM | POA: Insufficient documentation

## 2022-11-30 MED ORDER — NEOMYCIN-POLYMYXIN-HC 3.5-10000-1 OT SOLN
3.0000 [drp] | Freq: Four times a day (QID) | OTIC | 3 refills | Status: DC
  Filled 2022-11-30 (×2): qty 10, 17d supply, fill #0

## 2022-11-30 MED ORDER — MECLIZINE HCL 12.5 MG PO TABS
12.5000 mg | ORAL_TABLET | Freq: Three times a day (TID) | ORAL | 1 refills | Status: AC | PRN
  Filled 2022-11-30 (×2): qty 30, 10d supply, fill #0

## 2022-11-30 NOTE — Telephone Encounter (Signed)
Prior auth form completed & faxed.

## 2022-11-30 NOTE — Progress Notes (Signed)
Subjective:  Patient ID: Cindy Long, female    DOB: [birth year]  Age: 47 y.o. MRN: [id number]  CC: No chief complaint on file.   HPI Cindy Long presents for R ear congestion, dizziness x 2-3 d  Outpatient Medications Prior to Visit  Medication Sig Dispense Refill   acetaminophen (TYLENOL) 500 MG tablet Take 1,000 mg by mouth every 6 (six) hours as needed for moderate pain or headache.     albuterol (PROVENTIL HFA;VENTOLIN HFA) 108 (90 Base) MCG/ACT inhaler Inhale 1-2 puffs into the lungs every 6 (six) hours as needed for wheezing or shortness of breath.     aspirin EC 81 MG tablet Take 1 tablet (81 mg total) by mouth daily. Swallow whole. 90 tablet 3   atorvastatin (LIPITOR) 20 MG tablet Take 1 tablet by mouth daily. 90 tablet 3   Bacillus Coagulans-Inulin (PROBIOTIC) 1-250 BILLION-MG CAPS Take 1 capsule by mouth daily.     Continuous Blood Gluc Sensor (DEXCOM G7 SENSOR) MISC Change sensor every 10 days 9 each 4   cyanocobalamin (VITAMIN B12) 1000 MCG/ML injection Inject 1 ml subcutaneously once daily for 1 week-- then inject weekly for 1 month-- then inject once every 2 weeks 10 mL 6   dapagliflozin propanediol (FARXIGA) 10 MG TABS tablet Take 1 tablet (10 mg total) by mouth daily. 30 tablet 5   dicyclomine (BENTYL) 20 MG tablet Take 1 tablet (20 mg total) by mouth 2 (two) times daily as needed for abdominal cramping. 20 tablet 0   escitalopram (LEXAPRO) 20 MG tablet TAKE 2 TABLETS BY MOUTH ONCE A DAY. 180 tablet 3   hyoscyamine (LEVBID) 0.375 MG 12 hr tablet Take 1 tablet  by mouth as needed as directed (30 days). (Patient taking differently: Take 0.375 mg by mouth daily as needed for cramping.) 30 tablet 1   Insulin Pen Needle 32G X 4 MM MISC by Does not apply route. BD U/F Pen Needles Nano 4 mm x 32 G; use with insulin     insulin regular human CONCENTRATED (HUMULIN R U-500 KWIKPEN) 500 UNIT/ML KwikPen Inject 5-50 Units into the skin 3 (three) times daily with meals. Take  10 units in the morning, 45 units at lunch, 25 units at dinner. 24 mL 3   insulin regular human CONCENTRATED (HUMULIN R U-500 KWIKPEN) 500 UNIT/ML KwikPen Inject 10 units into the skin at breakfast, 40 units at lunch, 20 units at evening meal 24 mL 3   LORazepam (ATIVAN) 1 MG tablet Take 1/2 tablet by mouth once daily as needed (Patient taking differently: Take 0.5 mg by mouth daily as needed for anxiety.) 20 tablet 1   losartan (COZAAR) 50 MG tablet Take 1 tablet (50 mg total) by mouth daily. 90 tablet 1   medroxyPROGESTERone Acetate 150 MG/ML SUSY Inject 1 syringe into the muscle every 11-12 weeks 1 mL 11   metoCLOPramide (REGLAN) 10 MG tablet Take 1 tablet (10 mg total) by mouth every 6 (six) hours as needed for nausea and abdominal pain. 30 tablet 0   metoprolol succinate (TOPROL-XL) 25 MG 24 hr tablet Take 1 tablet (25 mg total) by mouth 2 (two) times daily. 180 tablet 3   nystatin-triamcinolone (MYCOLOG II) cream APPLY TO THE AFFECTED AREA(S) TWO TIMES DAILY AS DIRECTED (Patient taking differently: Apply 1 application  topically 2 (two) times daily as needed (rash).) 20 g 2   ondansetron (ZOFRAN) 4 MG tablet Take 1 tablet (4 mg total) by mouth every 8 (eight) hours as needed  for nausea or vomiting. 20 tablet 0   promethazine (PHENERGAN) 25 MG tablet TAKE 1 TABLET BY MOUTH EVERY 8 HOURS (Patient taking differently: Take 25 mg by mouth every 8 (eight) hours as needed for vomiting or nausea.) 90 tablet 1   repaglinide (PRANDIN) 1 MG tablet Take 1 tablet by mouth 3 times daily before meals. 270 tablet 1   spironolactone (ALDACTONE) 50 MG tablet Take 1 tablet by mouth every day. (Patient taking differently: Take 50 mg by mouth daily.) 30 tablet 11   SYRINGE-NEEDLE, DISP, 3 ML 25G X 5/8" 3 ML MISC Use to inject B12 under the skin 50 each 3   ustekinumab (STELARA) 130 MG/26ML SOLN injection Infuse 3 vials IV over at least 1 hour (Patient not taking: Reported on 07/29/2022) 78 mL 0   zolpidem (AMBIEN) 10  MG tablet Take 1/2-1 tablet (5-10 mg total) by mouth at bedtime as needed for sleep. 30 tablet 3   Semaglutide,0.25 or 0.'5MG'$ /DOS, (OZEMPIC, 0.25 OR 0.5 MG/DOSE,) 2 MG/3ML SOPN Inject 0.25 mg into the skin once a week for four weeks, then 0.5 mg once a week (Patient taking differently: Inject 0.5 mg into the skin every Tuesday.) 9 mL 4   ustekinumab (STELARA) 90 MG/ML SOSY injection Inject 90 mg into the skin every 8 weeks after initial IV infusion, then every 8 weeks thereafter 1 mL 6   No facility-administered medications prior to visit.    ROS: Review of Systems  Constitutional:  Negative for activity change, appetite change, chills, fatigue and unexpected weight change.  HENT:  Positive for ear pain. Negative for congestion, mouth sores and sinus pressure.   Eyes:  Negative for visual disturbance.  Respiratory:  Negative for cough and chest tightness.   Gastrointestinal:  Negative for abdominal pain and nausea.  Genitourinary:  Negative for difficulty urinating, frequency and vaginal pain.  Musculoskeletal:  Negative for back pain and gait problem.  Skin:  Negative for pallor and rash.  Neurological:  Positive for dizziness. Negative for tremors, weakness, numbness and headaches.  Psychiatric/Behavioral:  Negative for confusion and sleep disturbance.     Objective:  BP 110/80 (BP Location: Right Arm, Patient Position: Sitting, Cuff Size: Normal)   Pulse 94   Temp 98.1 F (36.7 C) (Oral)   Ht '5\' 6"'$  (1.676 m)   SpO2 98%   BMI 25.50 kg/m   BP Readings from Last 3 Encounters:  12/02/22 110/82  11/30/22 110/80  11/18/22 102/68    Wt Readings from Last 3 Encounters:  12/02/22 162 lb (73.5 kg)  10/31/22 158 lb (71.7 kg)  10/21/22 158 lb (71.7 kg)    Physical Exam Constitutional:      General: She is not in acute distress.    Appearance: She is well-developed.  HENT:     Head: Normocephalic.     Right Ear: External ear normal.     Left Ear: External ear normal.     Nose:  Nose normal.  Eyes:     General:        Right eye: No discharge.        Left eye: No discharge.     Conjunctiva/sclera: Conjunctivae normal.     Pupils: Pupils are equal, round, and reactive to light.  Neck:     Thyroid: No thyromegaly.     Vascular: No JVD.     Trachea: No tracheal deviation.  Cardiovascular:     Rate and Rhythm: Normal rate and regular rhythm.     Heart  sounds: Normal heart sounds.  Pulmonary:     Effort: No respiratory distress.     Breath sounds: No stridor. No wheezing.  Abdominal:     General: Bowel sounds are normal. There is no distension.     Palpations: Abdomen is soft. There is no mass.     Tenderness: There is no abdominal tenderness. There is no guarding or rebound.  Musculoskeletal:        General: No tenderness.     Cervical back: Normal range of motion and neck supple. No rigidity.  Lymphadenopathy:     Cervical: No cervical adenopathy.  Skin:    Findings: No erythema or rash.  Neurological:     Cranial Nerves: No cranial nerve deficit.     Motor: No abnormal muscle tone.     Coordination: Coordination normal.     Deep Tendon Reflexes: Reflexes normal.  Psychiatric:        Behavior: Behavior normal.        Thought Content: Thought content normal.        Judgment: Judgment normal.     R ear canal is closed w/soft wax; possible erythema Attempted to irrigate - painful; we stopped  Lab Results  Component Value Date   WBC 8.0 09/02/2022   HGB 14.9 09/02/2022   HCT 45.1 09/02/2022   PLT 251 09/02/2022   GLUCOSE 148 (H) 09/02/2022   CHOL 116 03/05/2022   TRIG 71 03/05/2022   HDL 34 (L) 03/05/2022   LDLCALC 68 03/05/2022   ALT 19 09/02/2022   AST 15 09/02/2022   NA 140 09/02/2022   K 3.6 09/02/2022   CL 110 09/02/2022   CREATININE 0.50 09/02/2022   BUN 6 09/02/2022   CO2 23 09/02/2022   TSH 0.89 08/11/2022   HGBA1C 10.0 (H) 07/25/2022    No results found.  Assessment & Plan:   Problem List Items Addressed This Visit        Nervous and Auditory   Otitis externa - Primary    R: Cortisporin otic gtt  Meclizine prn Will remove wax later        Other   Vertigo    Treat OE - R ear Meclizine prn Will remove wax later         Meds ordered this encounter  Medications   neomycin-polymyxin-hydrocortisone (CORTISPORIN) OTIC solution    Sig: Place 3 drops into the right ear 4 (four) times daily.    Dispense:  10 mL    Refill:  3   meclizine (ANTIVERT) 12.5 MG tablet    Sig: Take 1 tablet (12.5 mg total) by mouth 3 (three) times daily as needed for dizziness.    Dispense:  30 tablet    Refill:  1      Follow-up: No follow-ups on file.  Walker Kehr, MD

## 2022-11-30 NOTE — Assessment & Plan Note (Addendum)
Treat OE - R ear Meclizine prn Will remove wax later

## 2022-11-30 NOTE — Assessment & Plan Note (Addendum)
R: Cortisporin otic gtt  Meclizine prn Will remove wax later

## 2022-11-30 NOTE — Progress Notes (Signed)
[facility] [place] Phone: [phone] Subjective:   Cindy Long, am serving as a scribe for Dr. Hulan Saas.  I'm seeing this patient by the request  of:  Plotnikov, Evie Lacks, MD  CC: Bilateral knee pain  JHE:[id number]  03/23/2022 Patient has responded previously.  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  Discussed which activities to do and which ones to avoid, increase activity slowly.  Discussed icing regimen and home exercises.  We will follow-up again in 6 to 8 weeks otherwise.  Can repeat these injections every 6 months if necessary.      Update 12/02/2022 Cindy Long is a 47 y.o. female coming in with complaint of B knee pain. Patient states that both knees patient has had some more discomfort recently.  Not stopping her from activity but finds it difficult to do more than her regular daily activities secondary to discomfort and pain.      Past Medical History:  Diagnosis Date   Chest pressure 11/18/2021   Crohn disease ([place])    Diabetes mellitus    IDDM, Type 2   Essential hypertension 11/18/2021   GERD (gastroesophageal reflux disease)    Long meds currently   High cholesterol    Hypertension    Mastitis    right breast   Postpartum care following cesarean delivery (2/10) 12/17/2013   Pure hypercholesterolemia 11/18/2021   Past Surgical History:  Procedure Laterality Date   CERVICAL CERCLAGE     CERVICAL CERCLAGE N/A 06/21/2013   Procedure: [place] CERCLAGE CERVICAL;  Surgeon: Marvene Staff, MD;  Location: [facility];  Service: Gynecology;  Laterality: N/A;   CESAREAN SECTION  2008   CESAREAN SECTION N/A 12/17/2013   Procedure: Repeat CESAREAN SECTION with Cerclage Removal;  Surgeon: Marvene Staff, MD;  Location: [facility];  Service: Obstetrics;  Laterality: N/A;  EDD: 12/22/13   CHOLECYSTECTOMY     CHOLECYSTECTOMY OPEN  08/08/2011   GANGLION  CYST EXCISION Right 1997   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   Social History   Socioeconomic History   Marital status: Married    Spouse name: Not on file   Number of children: Not on file   Years of education: Not on file   Highest education level: Not on file  Occupational History   Not on file  Tobacco Use   Smoking status: Never    Passive exposure: Never   Smokeless tobacco: Never  Vaping Use   Vaping Use: Never used  Substance and Sexual Activity   Alcohol use: Yes    Comment: occasionally   Drug use: Long   Sexual activity: Not on file  Other Topics Concern   Not on file  Social History Narrative   Not on file   Social Determinants of Health   Financial Resource Strain: Low Risk  (11/18/2021)   Overall Financial Resource Strain (CARDIA)    Difficulty of Paying Living Expenses: Not hard at all  Food Insecurity: Long Food Insecurity (11/18/2021)   Hunger Vital Sign    Worried About Running Out of Food in the Last Year: Never true    [place] in the Last Year: Never true  Transportation Needs: Long Transportation Needs (11/18/2021)   PRAPARE - Hydrologist (Medical): Long    Lack of Transportation (Non-Medical): Long  Physical Activity: Inactive (11/18/2021)   Exercise Vital Sign    Days of  Exercise per Week: 0 days    Minutes of Exercise per Session: 0 min  Stress: Not on file  Social Connections: Not on file   Allergies  Allergen Reactions   Metronidazole Swelling    Throat swelling   Shellfish Allergy Swelling    Swelling of the throat   Empagliflozin-Linagliptin Other (See Comments)   Lisinopril Other (See Comments)   Metformin Hcl Other (See Comments)   Prednisone Other (See Comments)   Dulaglutide Rash   Liraglutide Rash   Family History  Problem Relation Age of Onset   Hypertension Mother    Hypercholesterolemia Mother    Other Sister        brain tumor   Diabetes Maternal Aunt    Hypertension  Maternal Grandmother    Cancer Maternal Grandmother        breast, colon   Diabetes Maternal Grandmother    Hypertension Maternal Grandfather    Heart attack Cousin 13   Healthy Daughter    Healthy Daughter     Current Outpatient Medications (Endocrine & Metabolic):    dapagliflozin propanediol (FARXIGA) 10 MG TABS tablet, Take 1 tablet (10 mg total) by mouth daily.   insulin regular human CONCENTRATED (HUMULIN R U-500 KWIKPEN) 500 UNIT/ML KwikPen, Inject 5-50 Units into the skin 3 (three) times daily with meals. Take 10 units in the morning, 45 units at lunch, 25 units at dinner.   insulin regular human CONCENTRATED (HUMULIN R U-500 KWIKPEN) 500 UNIT/ML KwikPen, Inject 10 units into the skin at breakfast, 40 units at lunch, 20 units at evening meal   medroxyPROGESTERone Acetate 150 MG/ML SUSY, Inject 1 syringe into the muscle every 11-12 weeks   repaglinide (PRANDIN) 1 MG tablet, Take 1 tablet by mouth 3 times daily before meals.  Current Outpatient Medications (Cardiovascular):    atorvastatin (LIPITOR) 20 MG tablet, Take 1 tablet by mouth daily.   losartan (COZAAR) 50 MG tablet, Take 1 tablet (50 mg total) by mouth daily.   metoprolol succinate (TOPROL-XL) 25 MG 24 hr tablet, Take 1 tablet (25 mg total) by mouth 2 (two) times daily.   spironolactone (ALDACTONE) 50 MG tablet, Take 1 tablet by mouth every day. (Patient taking differently: Take 50 mg by mouth daily.)  Current Outpatient Medications (Respiratory):    albuterol (PROVENTIL HFA;VENTOLIN HFA) 108 (90 Base) MCG/ACT inhaler, Inhale 1-2 puffs into the lungs every 6 (six) hours as needed for wheezing or shortness of breath.   promethazine (PHENERGAN) 25 MG tablet, TAKE 1 TABLET BY MOUTH EVERY 8 HOURS (Patient taking differently: Take 25 mg by mouth every 8 (eight) hours as needed for vomiting or nausea.)  Current Outpatient Medications (Analgesics):    acetaminophen (TYLENOL) 500 MG tablet, Take 1,000 mg by mouth every 6 (six)  hours as needed for moderate pain or headache.   aspirin EC 81 MG tablet, Take 1 tablet (81 mg total) by mouth daily. Swallow whole.  Current Outpatient Medications (Hematological):    cyanocobalamin (VITAMIN B12) 1000 MCG/ML injection, Inject 1 ml subcutaneously once daily for 1 week-- then inject weekly for 1 month-- then inject once every 2 weeks  Current Outpatient Medications (Other):    Bacillus Coagulans-Inulin (PROBIOTIC) 1-250 BILLION-MG CAPS, Take 1 capsule by mouth daily.   Continuous Blood Gluc Sensor (DEXCOM G7 SENSOR) MISC, Change sensor every 10 days   dicyclomine (BENTYL) 20 MG tablet, Take 1 tablet (20 mg total) by mouth 2 (two) times daily as needed for abdominal cramping.   escitalopram (LEXAPRO) 20 MG  tablet, TAKE 2 TABLETS BY MOUTH ONCE A DAY.   hyoscyamine (LEVBID) 0.375 MG 12 hr tablet, Take 1 tablet  by mouth as needed as directed (30 days). (Patient taking differently: Take 0.375 mg by mouth daily as needed for cramping.)   Insulin Pen Needle 32G X 4 MM MISC, by Does not apply route. BD U/F Pen Needles Nano 4 mm x 32 G; use with insulin   LORazepam (ATIVAN) 1 MG tablet, Take 1/2 tablet by mouth once daily as needed (Patient taking differently: Take 0.5 mg by mouth daily as needed for anxiety.)   meclizine (ANTIVERT) 12.5 MG tablet, Take 1 tablet (12.5 mg total) by mouth 3 (three) times daily as needed for dizziness.   metoCLOPramide (REGLAN) 10 MG tablet, Take 1 tablet (10 mg total) by mouth every 6 (six) hours as needed for nausea and abdominal pain.   neomycin-polymyxin-hydrocortisone (CORTISPORIN) OTIC solution, Place 3 drops into the right ear 4 (four) times daily.   nystatin-triamcinolone (MYCOLOG II) cream, APPLY TO THE AFFECTED AREA(S) TWO TIMES DAILY AS DIRECTED (Patient taking differently: Apply 1 application  topically 2 (two) times daily as needed (rash).)   ondansetron (ZOFRAN) 4 MG tablet, Take 1 tablet (4 mg total) by mouth every 8 (eight) hours as needed for  nausea or vomiting.   SYRINGE-NEEDLE, DISP, 3 ML 25G X 5/8" 3 ML MISC, Use to inject B12 under the skin   zolpidem (AMBIEN) 10 MG tablet, Take 1/2-1 tablet (5-10 mg total) by mouth at bedtime as needed for sleep.   ustekinumab (STELARA) 130 MG/26ML SOLN injection, Infuse 3 vials IV over at least 1 hour (Patient not taking: Reported on 07/29/2022)   Reviewed prior external information including notes and imaging from  primary care provider As well as notes that were available from care everywhere and other healthcare systems.  Past medical history, social, surgical and family history all reviewed in electronic medical record.  Long pertanent information unless stated regarding to the chief complaint.   Review of Systems:  Long headache, visual changes, nausea, vomiting, diarrhea, constipation, dizziness, abdominal pain, skin rash, fevers, chills, night sweats, weight loss, swollen lymph nodes, body aches, joint swelling, chest pain, shortness of breath, mood changes. POSITIVE muscle aches  Objective  Blood pressure 110/82, pulse 78, height '5\' 6"'$  (1.676 m), weight 162 lb (73.5 kg), SpO2 98 %, unknown if currently breastfeeding.   General: Long apparent distress alert and oriented x3 mood and affect normal, dressed appropriately.  HEENT: Pupils equal, extraocular movements intact  Respiratory: Patient's speak in full sentences and does not appear short of breath  Cardiovascular: Long lower extremity edema, non tender, Long erythema  Patient's knees bilaterally show significant muscle atrophy of the lower extremities.  Patient's knees are do have crepitus noted.  Positive grind test bilaterally.  Long significant instability with valgus and varus force.  After informed written and verbal consent, patient was seated on exam table. Right knee was prepped with alcohol swab and utilizing anterolateral approach, patient's right knee space was injected with 48 mg per 3 mL of Monovisc (sodium hyaluronate) in a  prefilled syringe was injected easily into the knee through a 22-gauge needle..Patient tolerated the procedure well without immediate complications.  After informed written and verbal consent, patient was seated on exam table. Left knee was prepped with alcohol swab and utilizing anterolateral approach, patient's left knee space was injected with 48 mg per 3 mL of Monovisc (sodium hyaluronate) in a prefilled syringe was injected easily into the  knee through a 22-gauge needle..Patient tolerated the procedure well without immediate complications.    Impression and Recommendations:      The above documentation has been reviewed and is accurate and complete Lyndal Pulley, DO

## 2022-12-01 NOTE — Telephone Encounter (Signed)
Orient authorized.

## 2022-12-02 ENCOUNTER — Ambulatory Visit: Payer: Commercial Managed Care - PPO | Admitting: Family Medicine

## 2022-12-02 VITALS — BP 110/82 | HR 78 | Ht 66.0 in | Wt 162.0 lb

## 2022-12-02 DIAGNOSIS — M1710 Unilateral primary osteoarthritis, unspecified knee: Secondary | ICD-10-CM | POA: Diagnosis not present

## 2022-12-02 MED ORDER — HYALURONAN 88 MG/4ML IX SOSY
176.0000 mg | PREFILLED_SYRINGE | Freq: Once | INTRA_ARTICULAR | Status: AC
  Administered 2022-12-02: 176 mg via INTRA_ARTICULAR

## 2022-12-02 NOTE — Assessment & Plan Note (Addendum)
Patient has very mild arthritic changes but has responded extremely well to viscosupplementation previously and is having worsening discomfort at the moment.  Discussed with patient about icing regimen and home exercises.  Discussed because patient does have some muscle atrophy of the lower extremities that hopefully this will help with decreasing progression secondary to strain on the joint itself.  Discussed with patient about continuing to be active.  Can repeat this every 6 months if necessary but at the moment can follow-up almost on an as-needed basis social determinant of health includes patient's diabetes has been complicated as well for her.

## 2022-12-02 NOTE — Patient Instructions (Signed)
Injected gel today See me again in 6 months or sooner if needed

## 2022-12-04 ENCOUNTER — Encounter: Payer: Self-pay | Admitting: Internal Medicine

## 2022-12-06 ENCOUNTER — Encounter: Payer: Commercial Managed Care - PPO | Attending: Internal Medicine | Admitting: Nutrition

## 2022-12-06 DIAGNOSIS — E1165 Type 2 diabetes mellitus with hyperglycemia: Secondary | ICD-10-CM | POA: Diagnosis not present

## 2022-12-06 DIAGNOSIS — E1065 Type 1 diabetes mellitus with hyperglycemia: Secondary | ICD-10-CM | POA: Diagnosis not present

## 2022-12-06 NOTE — Progress Notes (Signed)
Patient reports having been on an insulin pump "a long time ago".  Says she was on the original OmniPod.  She reports knowing how to count carbs, but does not do this. We reviewed what carbs are, and quantities of 15 gram portions.  She was encouraged to download the [facility] app for this.   She is currently wearing the Dexcom G7, and was told that she will need to go back to the G6 that will link to her pump.  We discussed how this new pump works with readings going directly to her pod and the pod adjusting the basal insulin dose-giving more when readings are high, and less when readings are lower.   She is interested in getting this.  She will see Dr. Buddy Duty in a few days.  Discussed that she will get a starter kit, that she will need to charge the PDM, and link this to OmniPod, and then to Dr Glenna Durand account.  She was given my number to call when she gets her starter kit and pods, and G6 sensors.  She had no final questions.

## 2022-12-06 NOTE — Patient Instructions (Signed)
Link the PDM to OmniPod, by setting up an OmniPod account with user name and password.  Call when you get your PDM and Dexcom G6 sensors You must be wearing a G6 sensor before starting the pod.

## 2022-12-07 ENCOUNTER — Encounter: Payer: Self-pay | Admitting: Internal Medicine

## 2022-12-07 ENCOUNTER — Other Ambulatory Visit (HOSPITAL_COMMUNITY): Payer: Self-pay

## 2022-12-07 NOTE — Progress Notes (Deleted)
Office Visit Note  Patient: Cindy Long             Date of Birth: [birth year]           MRN: [id number]             PCP: Cassandria Anger, MD Referring: Cassandria Anger, MD Visit Date: 12/21/2022 Occupation: '@GUAROCC'$ @  Subjective:  No chief complaint on file.   History of Present Illness: Cindy Long is a 47 y.o. female ***     Activities of Daily Living:  Patient reports morning stiffness for *** {minute/hour:19697}.   Patient {ACTIONS;DENIES/REPORTS:21021675::"Denies"} nocturnal pain.  Difficulty dressing/grooming: {ACTIONS;DENIES/REPORTS:21021675::"Denies"} Difficulty climbing stairs: {ACTIONS;DENIES/REPORTS:21021675::"Denies"} Difficulty getting out of chair: {ACTIONS;DENIES/REPORTS:21021675::"Denies"} Difficulty using hands for taps, buttons, cutlery, and/or writing: {ACTIONS;DENIES/REPORTS:21021675::"Denies"}  No Rheumatology ROS completed.   PMFS History:  Patient Active Problem List   Diagnosis Date Noted   Otitis externa 11/30/2022   Vertigo 11/30/2022   PSVT (paroxysmal supraventricular tachycardia) 06/08/2022   Anal fissure 05/19/2022   Fecal urgency 05/19/2022   Flatulence, eructation and gas pain 05/19/2022   Iron deficiency anemia 05/19/2022   Left lower quadrant pain 05/19/2022   Nausea 93/73/4287   Periumbilical pain 68/09/5725   Rectal pain 05/19/2022   Vomiting without nausea 05/19/2022   Thumb pain, right 05/19/2022   Well adult exam 05/19/2022   Crohn disease ([place]) 03/28/2022   Chest pain 03/05/2022   Dehydration 03/05/2022   Elevated troponin    Pain of left upper extremity 03/04/2022   Tachycardia 03/04/2022   Diarrhea 03/04/2022   Acute nonintractable headache 03/04/2022   Numbness and tingling in right hand 02/24/2022   Coronary artery disease 02/23/2022   Asthmatic bronchitis with acute exacerbation 02/23/2022   Upper respiratory infection 02/23/2022   Adjustment insomnia 12/22/2021   Other specified anxiety  disorders 12/22/2021   Crohn's disease of small and large intestines ([place]) 12/22/2021   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   Family history of breast cancer 12/22/2021   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   Gastroesophageal reflux disease 12/22/2021   Hyperglycemia due to type 2 diabetes mellitus ([place]) 12/22/2021   Intra-abdominal and pelvic swelling, mass and lump, unspecified site 12/22/2021   Long term (current) use of insulin ([place]) 12/22/2021   Mild intermittent asthma with (acute) exacerbation 12/22/2021   Pain in thoracic spine 12/22/2021   Rectal bleeding 12/22/2021   Right ankle sprain 12/22/2021   Crohn's disease ([place]) 12/13/2021   Vitamin D deficiency 12/13/2021   B12 deficiency 12/13/2021   Depression 12/13/2021   Chest pressure 11/18/2021   Palpitations 11/18/2021   Essential hypertension 11/18/2021   Pure hypercholesterolemia 11/18/2021   Patellofemoral arthritis 10/21/2021   Patellofemoral disorder, right 07/16/2021   Muscle atrophy 07/16/2021   Postpartum care following cesarean delivery (2/10) 12/17/2013   Indication for care in labor or delivery 11/30/2013   Preexisting diabetes complicating pregnancy in second trimester, antepartum 08/18/2013   IBS (irritable bowel syndrome) 02/05/2013   Dyslipidemia 02/05/2013   Abnormal uterine bleeding 06/18/2012   Pelvic pain 06/18/2012   Type II diabetes mellitus ([place]) 06/18/2012    Past Medical History:  Diagnosis Date   Chest pressure 11/18/2021   Crohn disease ([place])    Diabetes mellitus    IDDM, Type 2   Essential hypertension 11/18/2021   GERD (gastroesophageal reflux disease)    no meds currently   High cholesterol    Hypertension    Mastitis    right breast   Postpartum care following cesarean delivery (  2/10) 12/17/2013   Pure hypercholesterolemia 11/18/2021    Family History  Problem Relation Age of Onset   Hypertension Mother    Hypercholesterolemia Mother    Other Sister        brain tumor   Diabetes  Maternal Aunt    Hypertension Maternal Grandmother    Cancer Maternal Grandmother        breast, colon   Diabetes Maternal Grandmother    Hypertension Maternal Grandfather    Heart attack Cousin 41   Healthy Daughter    Healthy Daughter    Past Surgical History:  Procedure Laterality Date   CERVICAL CERCLAGE     CERVICAL CERCLAGE N/A 06/21/2013   Procedure: [place] CERCLAGE CERVICAL;  Surgeon: Marvene Staff, MD;  Location: [facility];  Service: Gynecology;  Laterality: N/A;   CESAREAN SECTION  2008   CESAREAN SECTION N/A 12/17/2013   Procedure: Repeat CESAREAN SECTION with Cerclage Removal;  Surgeon: Marvene Staff, MD;  Location: [facility];  Service: Obstetrics;  Laterality: N/A;  EDD: 12/22/13   CHOLECYSTECTOMY     CHOLECYSTECTOMY OPEN  08/08/2011   GANGLION CYST EXCISION Right 1997   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   Social History   Social History Narrative   Not on file   Immunization History  Administered Date(s) Administered   Hepatitis A, Adult 01/03/2019, 08/02/2019   Hepatitis B, adult 07/23/2016   Influenza Split 07/31/2015, 07/25/2016, 08/01/2019, 07/30/2020   Influenza Whole 07/25/2012   Influenza,inj,Quad PF,6+ Mos 07/31/2012, 08/01/2017, 08/10/2018   Influenza-Unspecified 07/30/2020, 07/25/2021   Moderna Sars-Covid-2 Vaccination 11/05/2019, 12/03/2019, 06/25/2020, 01/01/2021, 07/25/2021   Pneumococcal Conjugate-13 12/27/2018   Pneumococcal Polysaccharide-23 07/26/2016   Tdap 05/08/2015     Objective: Vital Signs: There were no vitals taken for this visit.   Physical Exam   Musculoskeletal Exam: ***  CDAI Exam: CDAI Score: -- Patient Global: --; Provider Global: -- Swollen: --; Tender: -- Joint Exam 12/21/2022   No joint exam has been documented for this visit   There is currently no information documented on the homunculus. Go to the Rheumatology activity and complete the homunculus joint exam.  Investigation: No  additional findings.  Imaging: No results found.  Recent Labs: Lab Results  Component Value Date   WBC 8.0 09/02/2022   HGB 14.9 09/02/2022   PLT 251 09/02/2022   NA 140 09/02/2022   K 3.6 09/02/2022   CL 110 09/02/2022   CO2 23 09/02/2022   GLUCOSE 148 (H) 09/02/2022   BUN 6 09/02/2022   CREATININE 0.50 09/02/2022   BILITOT 0.8 09/02/2022   ALKPHOS 62 09/02/2022   AST 15 09/02/2022   ALT 19 09/02/2022   PROT 7.3 09/02/2022   ALBUMIN 3.7 09/02/2022   CALCIUM 9.0 09/02/2022   GFRAA >60 11/12/2019    Speciality Comments: Imuran, Humira-inadequate response, IV Remicade July 22 -April 23-insurance Stelara May 2023-present-inadequate response  Procedures:  No procedures performed Allergies: Metronidazole, Shellfish allergy, Empagliflozin-linagliptin, Lisinopril, Metformin hcl, Prednisone, Dulaglutide, and Liraglutide   Assessment / Plan:     Visit Diagnoses: No diagnosis found.  Orders: No orders of the defined types were placed in this encounter.  No orders of the defined types were placed in this encounter.   Face-to-face time spent with patient was *** minutes. Greater than 50% of time was spent in counseling and coordination of care.  Follow-Up Instructions: No follow-ups on file.   Earnestine Mealing, CMA  Note - This record has been created using Editor, commissioning.  Chart  creation errors have been sought, but may not always  have been located. Such creation errors do not reflect on  the standard of medical care.

## 2022-12-08 DIAGNOSIS — Z794 Long term (current) use of insulin: Secondary | ICD-10-CM | POA: Diagnosis not present

## 2022-12-08 DIAGNOSIS — I10 Essential (primary) hypertension: Secondary | ICD-10-CM | POA: Diagnosis not present

## 2022-12-08 DIAGNOSIS — E1165 Type 2 diabetes mellitus with hyperglycemia: Secondary | ICD-10-CM | POA: Diagnosis not present

## 2022-12-08 DIAGNOSIS — I2510 Atherosclerotic heart disease of native coronary artery without angina pectoris: Secondary | ICD-10-CM | POA: Diagnosis not present

## 2022-12-09 ENCOUNTER — Other Ambulatory Visit (HOSPITAL_COMMUNITY): Payer: Self-pay

## 2022-12-12 ENCOUNTER — Other Ambulatory Visit (HOSPITAL_COMMUNITY): Payer: Self-pay

## 2022-12-12 ENCOUNTER — Other Ambulatory Visit: Payer: Self-pay | Admitting: Internal Medicine

## 2022-12-12 MED ORDER — FLUCONAZOLE 150 MG PO TABS
150.0000 mg | ORAL_TABLET | ORAL | 0 refills | Status: DC
  Filled 2022-12-12: qty 2, 4d supply, fill #0

## 2022-12-13 ENCOUNTER — Other Ambulatory Visit: Payer: Self-pay

## 2022-12-13 ENCOUNTER — Other Ambulatory Visit (HOSPITAL_COMMUNITY): Payer: Self-pay

## 2022-12-14 ENCOUNTER — Other Ambulatory Visit (HOSPITAL_COMMUNITY): Payer: Self-pay

## 2022-12-14 ENCOUNTER — Other Ambulatory Visit: Payer: Self-pay

## 2022-12-14 MED ORDER — OMNIPOD 5 DEXG7G6 INTRO GEN 5 KIT
PACK | 0 refills | Status: AC
  Filled 2022-12-14: qty 1, 30d supply, fill #0
  Filled 2023-03-08: qty 1, 10d supply, fill #0
  Filled 2023-03-21: qty 1, 30d supply, fill #0

## 2022-12-14 MED ORDER — OMNIPOD 5 DEXG7G6 PODS GEN 5 MISC
4 refills | Status: DC
  Filled 2023-03-08: qty 15, 30d supply, fill #0
  Filled 2023-03-21 (×2): qty 30, 30d supply, fill #0
  Filled 2023-03-21: qty 30, 60d supply, fill #0
  Filled 2023-04-13: qty 30, fill #0
  Filled 2023-04-19: qty 15, 30d supply, fill #0
  Filled 2023-05-23: qty 15, 30d supply, fill #1
  Filled 2023-07-14: qty 15, 30d supply, fill #2
  Filled 2023-08-21: qty 15, 30d supply, fill #3
  Filled 2023-09-17: qty 15, 30d supply, fill #4

## 2022-12-15 ENCOUNTER — Other Ambulatory Visit: Payer: Self-pay

## 2022-12-19 ENCOUNTER — Other Ambulatory Visit: Payer: Self-pay

## 2022-12-19 ENCOUNTER — Encounter (HOSPITAL_COMMUNITY): Payer: Self-pay | Admitting: Emergency Medicine

## 2022-12-19 ENCOUNTER — Emergency Department (HOSPITAL_COMMUNITY)
Admission: EM | Admit: 2022-12-19 | Discharge: 2022-12-19 | Disposition: A | Discharge status: Home / Self Care | Payer: Commercial Managed Care - PPO | Attending: Emergency Medicine | Admitting: Emergency Medicine

## 2022-12-19 DIAGNOSIS — Z794 Long term (current) use of insulin: Secondary | ICD-10-CM | POA: Diagnosis not present

## 2022-12-19 DIAGNOSIS — R531 Weakness: Secondary | ICD-10-CM | POA: Insufficient documentation

## 2022-12-19 DIAGNOSIS — Z7982 Long term (current) use of aspirin: Secondary | ICD-10-CM | POA: Insufficient documentation

## 2022-12-19 DIAGNOSIS — U071 COVID-19: Secondary | ICD-10-CM | POA: Insufficient documentation

## 2022-12-19 DIAGNOSIS — Z79899 Other long term (current) drug therapy: Secondary | ICD-10-CM | POA: Diagnosis not present

## 2022-12-19 DIAGNOSIS — I10 Essential (primary) hypertension: Secondary | ICD-10-CM | POA: Diagnosis not present

## 2022-12-19 DIAGNOSIS — R000 Tachycardia, unspecified: Secondary | ICD-10-CM | POA: Insufficient documentation

## 2022-12-19 DIAGNOSIS — E1165 Type 2 diabetes mellitus with hyperglycemia: Secondary | ICD-10-CM | POA: Insufficient documentation

## 2022-12-19 DIAGNOSIS — R519 Headache, unspecified: Secondary | ICD-10-CM | POA: Diagnosis present

## 2022-12-19 DIAGNOSIS — R9431 Abnormal electrocardiogram [ECG] [EKG]: Secondary | ICD-10-CM | POA: Diagnosis not present

## 2022-12-19 DIAGNOSIS — R739 Hyperglycemia, unspecified: Secondary | ICD-10-CM

## 2022-12-19 LAB — URINALYSIS, ROUTINE W REFLEX MICROSCOPIC
Bacteria, UA: NONE SEEN
Bilirubin Urine: NEGATIVE
Glucose, UA: >=500 mg/dL — AB
Hgb urine dipstick: NEGATIVE
Ketones, ur: NEGATIVE mg/dL
Leukocytes,Ua: NEGATIVE
Nitrite: NEGATIVE
Protein, ur: NEGATIVE mg/dL
Specific Gravity, Urine: 1.022 (ref 1.005–1.030)
pH: 6 (ref 5.0–8.0)

## 2022-12-19 LAB — BASIC METABOLIC PANEL
Anion gap: 11 (ref 5–15)
BUN: 7 mg/dL (ref 6–20)
CO2: 20 mmol/L — ABNORMAL LOW (ref 22–32)
Calcium: 9.2 mg/dL (ref 8.9–10.3)
Chloride: 101 mmol/L (ref 98–111)
Creatinine, Ser: 0.71 mg/dL (ref 0.44–1.00)
GFR, Estimated: >60 mL/min (ref >60)
Glucose, Bld: 458 mg/dL — ABNORMAL HIGH (ref 70–99)
Potassium: 4 mmol/L (ref 3.5–5.1)
Sodium: 132 mmol/L — ABNORMAL LOW (ref 135–145)

## 2022-12-19 LAB — RESP PANEL BY RT-PCR (RSV, FLU A&B, COVID)  RVPGX2
Influenza A by PCR: NEGATIVE
Influenza B by PCR: NEGATIVE
Resp Syncytial Virus by PCR: NEGATIVE
SARS Coronavirus 2 by RT PCR: POSITIVE — AB

## 2022-12-19 LAB — CBC
HCT: 45.7 % (ref 36.0–46.0)
Hemoglobin: 14.9 g/dL (ref 12.0–15.0)
MCH: 27.2 pg (ref 26.0–34.0)
MCHC: 32.6 g/dL (ref 30.0–36.0)
MCV: 83.4 fL (ref 80.0–100.0)
Platelets: 237 10*3/uL (ref 150–400)
RBC: 5.48 MIL/uL — ABNORMAL HIGH (ref 3.87–5.11)
RDW: 13 % (ref 11.5–15.5)
WBC: 5 10*3/uL (ref 4.0–10.5)
nRBC: 0 % (ref 0.0–0.2)

## 2022-12-19 LAB — CBG MONITORING, ED: Glucose-Capillary: 324 mg/dL — ABNORMAL HIGH (ref 70–99)

## 2022-12-19 LAB — MAGNESIUM: Magnesium: 1.9 mg/dL (ref 1.7–2.4)

## 2022-12-19 MED ORDER — PAXLOVID (300/100) 20 X 150 MG & 10 X 100MG PO TBPK: 3.0000 | ORAL_TABLET | Freq: Two times a day (BID) | ORAL | 0 refills | Status: AC

## 2022-12-19 MED ORDER — LACTATED RINGERS IV BOLUS
1000.0000 mL | Freq: Once | INTRAVENOUS | Status: AC
  Administered 2022-12-19: 1000 mL via INTRAVENOUS

## 2022-12-19 MED ORDER — ACETAMINOPHEN 500 MG PO TABS
1000.0000 mg | ORAL_TABLET | Freq: Once | ORAL | Status: AC
  Administered 2022-12-19: 1000 mg via ORAL
  Filled 2022-12-19: qty 2

## 2022-12-19 MED ORDER — ACETAMINOPHEN 325 MG PO TABS: 650.0000 mg | ORAL_TABLET | Freq: Once | ORAL | Status: DC

## 2022-12-19 MED ORDER — KETOROLAC TROMETHAMINE 15 MG/ML IJ SOLN
15.0000 mg | Freq: Once | INTRAMUSCULAR | Status: AC
  Administered 2022-12-19: 15 mg via INTRAVENOUS
  Filled 2022-12-19: qty 1

## 2022-12-19 NOTE — ED Notes (Signed)
Pt moved to treatment room and placed on cardiac monitor. HR noted to have decreased since triage. 567m of LR administered so far.

## 2022-12-19 NOTE — Inpatient Diabetes Management (Signed)
Inpatient Diabetes Program Recommendations  AACE/ADA: New Consensus Statement on Inpatient Glycemic Control (2015)  Target Ranges:  Prepandial:   less than 140 mg/dL      Peak postprandial:   less than 180 mg/dL (1-2 hours)      Critically ill patients:  140 - 180 mg/dL   Lab Results  Component Value Date   GLUCAP 234 (H) 03/06/2022   HGBA1C 10.0 (H) 07/25/2022    Review of Glycemic Control  Latest Reference Range & Units 12/19/22 12:08  Glucose 70 - 99 mg/dL 458 (H)   Diabetes history: DM 2 Outpatient Diabetes medications: Humulin R U-500 25 units breakfast, 50 units lunch, 25 units dinner Current orders for Inpatient glycemic control:  Being evaluated in ED  Note: Lab glucose 458, pt reported just eating a peanut butter and crackers. Messaged paramedic and ED MD taking care of pt.  Spoke with pt over the phone. Pt reports seeing Dr. Buddy Duty, Endocrinology, for DM management. Pt has an appointment to start dexcom insulin pump on Thursday this week. Pt takes Humulin R U-500 at home and occasionally misses doses (2 times during the week approx). Discussed with pt about glucose control while here at the hospital encouraged follow up with her Endocrinologist. Pt reports last A1c was 11% 2 weeks ago at last Endocrinology appointment.  If admitted would suggest placing pt on Humulin R U-500 insulin at reduced dose from home in addition to correction scale.  Thanks,  Tama Headings RN, MSN, BC-ADM Inpatient Diabetes Coordinator Team Pager [phone] (8a-5p)

## 2022-12-19 NOTE — ED Triage Notes (Signed)
Pt c/o headache, body aches and weakness x 1 day. Hr 140 in triage. Having diarrhea as well.

## 2022-12-19 NOTE — Discharge Instructions (Addendum)
You were seen for your COVID infection in the emergency department.   At home, please take the Paxlovid we have prescribed you for your COVID.  Please hold your Prandin, Ambien, and Lipitor while taking this medication and resume after you finish your Paxlovid course.  Check your MyChart online for the results of any tests that had not resulted by the time you left the emergency department.   Follow-up with your primary doctor in 2-3 days regarding your visit.    Return immediately to the emergency department if you experience any of the following: Difficulty breathing, or any other concerning symptoms.    Thank you for visiting our Emergency Department. It was a pleasure taking care of you today.

## 2022-12-20 ENCOUNTER — Telehealth: Payer: Self-pay | Admitting: Nutrition

## 2022-12-20 ENCOUNTER — Telehealth: Payer: Self-pay

## 2022-12-20 ENCOUNTER — Other Ambulatory Visit (HOSPITAL_COMMUNITY): Payer: Self-pay

## 2022-12-20 ENCOUNTER — Other Ambulatory Visit: Payer: Self-pay

## 2022-12-20 NOTE — Transitions of Care (Post Inpatient/ED Visit) (Signed)
   12/20/2022  Name: Cindy Long MRN: [id number] DOB: 03-12-1976  Today's TOC FU Call Status: Today's TOC FU Call Status:: Unsuccessul Call (1st Attempt) Unsuccessful Call (1st Attempt) Date: 12/20/22  Attempted to reach the patient regarding the most recent Inpatient/ED visit.  Follow Up Plan: Additional outreach attempts will be made to reach the patient to complete the Transitions of Care (Post Inpatient/ED visit) call.   Signature Juanda Crumble, [place] Direct Dial [phone]

## 2022-12-20 NOTE — Progress Notes (Unsigned)
Subjective:    Patient ID: Cindy Long, female    DOB: [birth year], 47 y.o.   MRN: [id number]      HPI Cindy Long is here for No chief complaint on file.   COVID positive - on paxlovid that was started 12/19/22.    Elevated sugars - on insulin pump  Elevated HR - inc metoprolol , dec losartan or aldactone   Medications and allergies reviewed with patient and updated if appropriate.  Current Outpatient Medications on File Prior to Visit  Medication Sig Dispense Refill   acetaminophen (TYLENOL) 500 MG tablet Take 1,000 mg by mouth every 6 (six) hours as needed for moderate pain or headache.     albuterol (PROVENTIL HFA;VENTOLIN HFA) 108 (90 Base) MCG/ACT inhaler Inhale 1-2 puffs into the lungs every 6 (six) hours as needed for wheezing or shortness of breath.     aspirin EC 81 MG tablet Take 1 tablet (81 mg total) by mouth daily. Swallow whole. 90 tablet 3   Bacillus Coagulans-Inulin (PROBIOTIC) 1-250 BILLION-MG CAPS Take 1 capsule by mouth daily.     Continuous Blood Gluc Sensor (DEXCOM G7 SENSOR) MISC Change sensor every 10 days 9 each 4   cyanocobalamin (VITAMIN B12) 1000 MCG/ML injection Inject 1 ml subcutaneously once daily for 1 week-- then inject weekly for 1 month-- then inject once every 2 weeks 10 mL 6   dapagliflozin propanediol (FARXIGA) 10 MG TABS tablet Take 1 tablet (10 mg total) by mouth daily. 30 tablet 5   dicyclomine (BENTYL) 20 MG tablet Take 1 tablet (20 mg total) by mouth 2 (two) times daily as needed for abdominal cramping. (Patient not taking: Reported on 12/19/2022) 20 tablet 0   escitalopram (LEXAPRO) 20 MG tablet TAKE 2 TABLETS BY MOUTH ONCE A DAY. (Patient not taking: Reported on 12/19/2022) 180 tablet 3   fluconazole (DIFLUCAN) 150 MG tablet Take 1 tablet (150 mg total) by mouth every other day. (Patient not taking: Reported on 12/19/2022) 2 tablet 0   hyoscyamine (LEVBID) 0.375 MG 12 hr tablet Take 1 tablet  by mouth as needed as directed (30 days).  (Patient taking differently: Take 0.375 mg by mouth daily as needed for cramping.) 30 tablet 1   Insulin Disposable Pump (OMNIPOD 5 G6 INTRO, GEN 5,) KIT Change pod every 2 days as directed (TDD of 100 units) 1 kit 0   Insulin Disposable Pump (OMNIPOD 5 G6 POD, GEN 5,) MISC Change pod every 2 days as directed (TDD of 100 units) 30 each 4   Insulin Pen Needle 32G X 4 MM MISC by Does not apply route. BD U/F Pen Needles Nano 4 mm x 32 G; use with insulin     insulin regular human CONCENTRATED (HUMULIN R U-500 KWIKPEN) 500 UNIT/ML KwikPen Inject 5-50 Units into the skin 3 (three) times daily with meals. Take 10 units in the morning, 45 units at lunch, 25 units at dinner. (Patient taking differently: Inject 5-50 Units into the skin 3 (three) times daily with meals. Take 25 units in the morning, 50 units at lunch, 25 units at dinner.) 24 mL 3   insulin regular human CONCENTRATED (HUMULIN R U-500 KWIKPEN) 500 UNIT/ML KwikPen Inject 10 units into the skin at breakfast, 40 units at lunch, 20 units at evening meal (Patient not taking: Reported on 12/19/2022) 24 mL 3   LORazepam (ATIVAN) 1 MG tablet Take 1/2 tablet by mouth once daily as needed 20 tablet 1   losartan (COZAAR) 50 MG tablet Take  1 tablet (50 mg total) by mouth daily. 90 tablet 1   meclizine (ANTIVERT) 12.5 MG tablet Take 1 tablet (12.5 mg total) by mouth 3 (three) times daily as needed for dizziness. 30 tablet 1   medroxyPROGESTERone Acetate 150 MG/ML SUSY Inject 1 syringe into the muscle every 11-12 weeks 1 mL 11   metoCLOPramide (REGLAN) 10 MG tablet Take 1 tablet (10 mg total) by mouth every 6 (six) hours as needed for nausea and abdominal pain. 30 tablet 0   metoprolol succinate (TOPROL-XL) 25 MG 24 hr tablet Take 1 tablet (25 mg total) by mouth 2 (two) times daily. 180 tablet 3   neomycin-polymyxin-hydrocortisone (CORTISPORIN) OTIC solution Place 3 drops into the right ear 4 (four) times daily. (Patient not taking: Reported on 12/19/2022) 10 mL 3    nirmatrelvir & ritonavir (PAXLOVID, 300/100,) 20 x 150 MG & 10 x 100MG TBPK Take 3 tablets by mouth 2 (two) times daily for 5 days. 30 tablet 0   nystatin-triamcinolone (MYCOLOG II) cream APPLY TO THE AFFECTED AREA(S) TWO TIMES DAILY AS DIRECTED (Patient taking differently: Apply 1 application  topically 2 (two) times daily as needed (rash).) 20 g 2   ondansetron (ZOFRAN) 4 MG tablet Take 1 tablet (4 mg total) by mouth every 8 (eight) hours as needed for nausea or vomiting. 20 tablet 0   promethazine (PHENERGAN) 25 MG tablet TAKE 1 TABLET BY MOUTH EVERY 8 HOURS (Patient taking differently: Take 25 mg by mouth every 8 (eight) hours as needed for vomiting or nausea.) 90 tablet 1   spironolactone (ALDACTONE) 50 MG tablet Take 1 tablet by mouth every day. (Patient taking differently: Take 50 mg by mouth daily.) 30 tablet 11   SYRINGE-NEEDLE, DISP, 3 ML 25G X 5/8" 3 ML MISC Use to inject B12 under the skin 50 each 3   ustekinumab (STELARA) 130 MG/26ML SOLN injection Infuse 3 vials IV over at least 1 hour (Patient not taking: Reported on 07/29/2022) 78 mL 0   No current facility-administered medications on file prior to visit.    Review of Systems     Objective:  There were no vitals filed for this visit. BP Readings from Last 3 Encounters:  12/19/22 122/78  12/02/22 110/82  11/30/22 110/80   Wt Readings from Last 3 Encounters:  12/02/22 162 lb (73.5 kg)  10/31/22 158 lb (71.7 kg)  10/21/22 158 lb (71.7 kg)   There is no height or weight on file to calculate BMI.    Physical Exam         Assessment & Plan:    See Problem List for Assessment and Plan of chronic medical problems.

## 2022-12-20 NOTE — Telephone Encounter (Signed)
Call from Dr. Cindra Eves office to pick up orders for OmniPod 5 pump training.  Pt. Called, but left voice mail to call to set up appt. For training on these.

## 2022-12-21 ENCOUNTER — Encounter: Payer: Self-pay | Admitting: Internal Medicine

## 2022-12-21 ENCOUNTER — Ambulatory Visit (INDEPENDENT_AMBULATORY_CARE_PROVIDER_SITE_OTHER): Payer: Commercial Managed Care - PPO | Admitting: Internal Medicine

## 2022-12-21 ENCOUNTER — Other Ambulatory Visit (HOSPITAL_COMMUNITY): Payer: Self-pay

## 2022-12-21 ENCOUNTER — Telehealth: Payer: Self-pay | Admitting: Nutrition

## 2022-12-21 ENCOUNTER — Ambulatory Visit: Payer: 59 | Admitting: Rheumatology

## 2022-12-21 VITALS — BP 124/78 | HR 100 | Temp 98.4°F | Ht 66.0 in | Wt 167.0 lb

## 2022-12-21 DIAGNOSIS — M79671 Pain in right foot: Secondary | ICD-10-CM

## 2022-12-21 DIAGNOSIS — Z79899 Other long term (current) drug therapy: Secondary | ICD-10-CM

## 2022-12-21 DIAGNOSIS — E559 Vitamin D deficiency, unspecified: Secondary | ICD-10-CM

## 2022-12-21 DIAGNOSIS — I2510 Atherosclerotic heart disease of native coronary artery without angina pectoris: Secondary | ICD-10-CM

## 2022-12-21 DIAGNOSIS — E1165 Type 2 diabetes mellitus with hyperglycemia: Secondary | ICD-10-CM | POA: Diagnosis not present

## 2022-12-21 DIAGNOSIS — M79642 Pain in left hand: Secondary | ICD-10-CM

## 2022-12-21 DIAGNOSIS — E7800 Pure hypercholesterolemia, unspecified: Secondary | ICD-10-CM

## 2022-12-21 DIAGNOSIS — M2550 Pain in unspecified joint: Secondary | ICD-10-CM

## 2022-12-21 DIAGNOSIS — R768 Other specified abnormal immunological findings in serum: Secondary | ICD-10-CM

## 2022-12-21 DIAGNOSIS — Z8719 Personal history of other diseases of the digestive system: Secondary | ICD-10-CM

## 2022-12-21 DIAGNOSIS — I471 Supraventricular tachycardia, unspecified: Secondary | ICD-10-CM | POA: Diagnosis not present

## 2022-12-21 DIAGNOSIS — I10 Essential (primary) hypertension: Secondary | ICD-10-CM

## 2022-12-21 DIAGNOSIS — Z803 Family history of malignant neoplasm of breast: Secondary | ICD-10-CM

## 2022-12-21 DIAGNOSIS — K50818 Crohn's disease of both small and large intestine with other complication: Secondary | ICD-10-CM

## 2022-12-21 DIAGNOSIS — G5601 Carpal tunnel syndrome, right upper limb: Secondary | ICD-10-CM

## 2022-12-21 DIAGNOSIS — K5730 Diverticulosis of large intestine without perforation or abscess without bleeding: Secondary | ICD-10-CM

## 2022-12-21 DIAGNOSIS — Z794 Long term (current) use of insulin: Secondary | ICD-10-CM | POA: Diagnosis not present

## 2022-12-21 DIAGNOSIS — F5102 Adjustment insomnia: Secondary | ICD-10-CM

## 2022-12-21 DIAGNOSIS — J4521 Mild intermittent asthma with (acute) exacerbation: Secondary | ICD-10-CM

## 2022-12-21 DIAGNOSIS — E11618 Type 2 diabetes mellitus with other diabetic arthropathy: Secondary | ICD-10-CM

## 2022-12-21 DIAGNOSIS — Z8742 Personal history of other diseases of the female genital tract: Secondary | ICD-10-CM

## 2022-12-21 DIAGNOSIS — E538 Deficiency of other specified B group vitamins: Secondary | ICD-10-CM

## 2022-12-21 DIAGNOSIS — G8929 Other chronic pain: Secondary | ICD-10-CM

## 2022-12-21 MED ORDER — ALBUTEROL SULFATE HFA 108 (90 BASE) MCG/ACT IN AERS
1.0000 | INHALATION_SPRAY | Freq: Four times a day (QID) | RESPIRATORY_TRACT | 5 refills | Status: AC | PRN
  Filled 2022-12-21: qty 6.7, 25d supply, fill #0
  Filled 2023-08-11: qty 6.7, 25d supply, fill #1
  Filled 2023-10-08: qty 6.7, 25d supply, fill #2

## 2022-12-21 MED ORDER — LANTUS SOLOSTAR 100 UNIT/ML ~~LOC~~ SOPN: 20.0000 [IU] | PEN_INJECTOR | Freq: Once | SUBCUTANEOUS | 0 refills | Status: DC

## 2022-12-21 MED ORDER — PROMETHAZINE-DM 6.25-15 MG/5ML PO SYRP
5.0000 mL | ORAL_SOLUTION | Freq: Four times a day (QID) | ORAL | 0 refills | Status: DC | PRN
  Filled 2022-12-21: qty 150, 8d supply, fill #0

## 2022-12-21 NOTE — Telephone Encounter (Signed)
LVM to call me to schedule pump training

## 2022-12-21 NOTE — Transitions of Care (Post Inpatient/ED Visit) (Signed)
   12/21/2022  Name: Cindy Long MRN: [id number] DOB: 1976/08/01  Today's TOC FU Call Status: Today's TOC FU Call Status:: Unsuccessful Call (2nd Attempt) Unsuccessful Call (1st Attempt) Date: 12/20/22 Unsuccessful Call (2nd Attempt) Date: 12/21/22  Attempted to reach the patient regarding the most recent Inpatient/ED visit.  Follow Up Plan: No further outreach attempts will be made at this time. We have been unable to contact the patient. Patient had appt today Signature Juanda Crumble, [place] Direct Dial [phone]

## 2022-12-21 NOTE — Patient Instructions (Addendum)
      Medications changes include :   cough syrup, albuterol, lantus 20 units today     Return if symptoms worsen or fail to improve.

## 2022-12-22 ENCOUNTER — Other Ambulatory Visit (HOSPITAL_COMMUNITY): Payer: Self-pay

## 2022-12-22 DIAGNOSIS — I10 Essential (primary) hypertension: Secondary | ICD-10-CM | POA: Diagnosis not present

## 2022-12-22 DIAGNOSIS — E1165 Type 2 diabetes mellitus with hyperglycemia: Secondary | ICD-10-CM | POA: Diagnosis not present

## 2022-12-22 DIAGNOSIS — I2510 Atherosclerotic heart disease of native coronary artery without angina pectoris: Secondary | ICD-10-CM | POA: Diagnosis not present

## 2022-12-22 DIAGNOSIS — Z794 Long term (current) use of insulin: Secondary | ICD-10-CM | POA: Diagnosis not present

## 2022-12-23 DIAGNOSIS — Z3042 Encounter for surveillance of injectable contraceptive: Secondary | ICD-10-CM | POA: Diagnosis not present

## 2022-12-25 NOTE — ED Provider Notes (Signed)
[name] Provider Note   CSN: [id number] Arrival date & time: 12/19/22  1111     History  Chief Complaint  Patient presents with   Headache   Weakness    Cindy Long is a 47 y.o. female.  47 year old female with a history of diabetes, hypertension, presents to the emergency department with headache body aches and weakness that started yesterday.  Also has been having significant diarrhea.  Nonbloody and on melanotic.  No mucus in her stool.  No significant abdominal pain.  Reports that her daughter is having similar symptoms.  Also reports fever.       Home Medications Prior to Admission medications   Medication Sig Start Date End Date Taking? Authorizing Provider  acetaminophen (TYLENOL) 500 MG tablet Take 1,000 mg by mouth every 6 (six) hours as needed for moderate pain or headache.   Yes [provider]  aspirin EC 81 MG tablet Take 1 tablet (81 mg total) by mouth daily. Swallow whole. 01/17/22  Yes Marylu Lund., NP  Bacillus Coagulans-Inulin (PROBIOTIC) 1-250 BILLION-MG CAPS Take 1 capsule by mouth daily.   Yes [provider]  dapagliflozin propanediol (FARXIGA) 10 MG TABS tablet Take 1 tablet (10 mg total) by mouth daily. 10/04/22  Yes Plotnikov, Evie Lacks, MD  hyoscyamine (LEVBID) 0.375 MG 12 hr tablet Take 1 tablet  by mouth as needed as directed (30 days). 11/25/21  Yes Ronnette Juniper, MD  insulin regular human CONCENTRATED (HUMULIN R U-500 KWIKPEN) 500 UNIT/ML KwikPen Inject 5-50 Units into the skin 3 (three) times daily with meals. Take 10 units in the morning, 45 units at lunch, 25 units at dinner. Patient taking differently: Inject 5-50 Units into the skin 3 (three) times daily with meals. Take 25 units in the morning, 50 units at lunch, 25 units at dinner. 03/06/22  Yes Eugenie Filler, MD  LORazepam (ATIVAN) 1 MG tablet Take 1/2 tablet by mouth once daily as needed 11/25/21  Yes   losartan  (COZAAR) 50 MG tablet Take 1 tablet (50 mg total) by mouth daily. 11/21/22  Yes Plotnikov, Evie Lacks, MD  meclizine (ANTIVERT) 12.5 MG tablet Take 1 tablet (12.5 mg total) by mouth 3 (three) times daily as needed for dizziness. 11/30/22 11/30/23 Yes Plotnikov, Evie Lacks, MD  medroxyPROGESTERone Acetate 150 MG/ML SUSY Inject 1 syringe into the muscle every 11-12 weeks 05/25/22  Yes   metoprolol succinate (TOPROL-XL) 25 MG 24 hr tablet Take 1 tablet (25 mg total) by mouth 2 (two) times daily. 06/08/22  Yes Plotnikov, Evie Lacks, MD  promethazine (PHENERGAN) 25 MG tablet TAKE 1 TABLET BY MOUTH EVERY 8 HOURS Patient taking differently: Take 25 mg by mouth every 8 (eight) hours as needed for vomiting or nausea. 11/25/21  Yes   spironolactone (ALDACTONE) 50 MG tablet Take 1 tablet by mouth every day. Patient taking differently: Take 50 mg by mouth daily. 11/30/21  Yes   albuterol (VENTOLIN HFA) 108 (90 Base) MCG/ACT inhaler Inhale 1-2 puffs into the lungs every 6 (six) hours as needed for wheezing or shortness of breath. 12/21/22   Cindy Rail, MD  Continuous Blood Gluc Sensor (DEXCOM G7 SENSOR) MISC Change sensor every 10 days 04/26/22     cyanocobalamin (VITAMIN B12) 1000 MCG/ML injection Inject 1 ml subcutaneously once daily for 1 week-- then inject weekly for 1 month-- then inject once every 2 weeks 12/01/21   Plotnikov, Evie Lacks, MD  dicyclomine (BENTYL) 20 MG  tablet Take 1 tablet (20 mg total) by mouth 2 (two) times daily as needed for abdominal cramping. Patient not taking: Reported on 12/19/2022 09/02/22   Elgie Congo, MD  escitalopram (LEXAPRO) 20 MG tablet TAKE 2 TABLETS BY MOUTH ONCE A DAY. Patient not taking: Reported on 12/19/2022 11/25/21     fluconazole (DIFLUCAN) 150 MG tablet Take 1 tablet (150 mg total) by mouth every other day. Patient not taking: Reported on 12/19/2022 12/12/22   Plotnikov, Evie Lacks, MD  Insulin Disposable Pump (OMNIPOD 5 G6 INTRO, GEN 5,) KIT Change pod every 2 days as  directed (TDD of 100 units) 12/14/22     Insulin Disposable Pump (OMNIPOD 5 G6 POD, GEN 5,) MISC Change pod every 2 days as directed (TDD of 100 units) 12/14/22     insulin glargine (LANTUS SOLOSTAR) 100 UNIT/ML Solostar Pen Inject 20 Units into the skin once for 1 dose. 12/21/22 12/21/22  Cindy Rail, MD  Insulin Pen Needle 32G X 4 MM MISC by Does not apply route. BD U/F Pen Needles Nano 4 mm x 32 G; use with insulin    [provider]  insulin regular human CONCENTRATED (HUMULIN R U-500 KWIKPEN) 500 UNIT/ML KwikPen Inject 10 units into the skin at breakfast, 40 units at lunch, 20 units at evening meal 05/27/22     nystatin-triamcinolone (MYCOLOG II) cream APPLY TO THE AFFECTED AREA(S) TWO TIMES DAILY AS DIRECTED Patient taking differently: Apply 1 application  topically 2 (two) times daily as needed (rash). 09/25/21     promethazine-dextromethorphan (PROMETHAZINE-DM) 6.25-15 MG/5ML syrup Take 5 mLs by mouth 4 (four) times daily as needed for cough. 12/21/22   Cindy Rail, MD  SYRINGE-NEEDLE, DISP, 3 ML 25G X 5/8" 3 ML MISC Use to inject B12 under the skin 12/01/21   Plotnikov, Evie Lacks, MD  ustekinumab [facility]) 130 MG/26ML SOLN injection Infuse 3 vials IV over at least 1 hour 03/16/22   Tresa Garter, MD      Allergies    Metronidazole, Shellfish allergy, Empagliflozin-linagliptin, Lisinopril, Metformin hcl, Prednisone, Dulaglutide, and Liraglutide    Review of Systems   Review of Systems  Physical Exam Updated Vital Signs BP 122/78   Pulse 99   Temp 98.5 F (36.9 C) (Oral)   Resp 19   SpO2 95%  Physical Exam Vitals and nursing note reviewed.  Constitutional:      General: She is not in acute distress.    Appearance: She is well-developed.  HENT:     Head: Normocephalic and atraumatic.     Right Ear: External ear normal.     Left Ear: External ear normal.     Nose: Congestion present.  Eyes:     Extraocular Movements: Extraocular movements intact.      Conjunctiva/sclera: Conjunctivae normal.     Pupils: Pupils are equal, round, and reactive to light.  Cardiovascular:     Rate and Rhythm: Regular rhythm. Tachycardia present.     Heart sounds: No murmur heard. Pulmonary:     Effort: Pulmonary effort is normal. No respiratory distress.     Breath sounds: Normal breath sounds.  Abdominal:     General: Abdomen is flat. There is no distension.     Palpations: Abdomen is soft. There is no mass.     Tenderness: There is no abdominal tenderness. There is no guarding.  Musculoskeletal:     Cervical back: Normal range of motion and neck supple.     Right lower leg: No  edema.     Left lower leg: No edema.  Skin:    General: Skin is warm and dry.  Neurological:     Mental Status: She is alert and oriented to person, place, and time. Mental status is at baseline.  Psychiatric:        Mood and Affect: Mood normal.     ED Results / Procedures / Treatments   Labs (all labs ordered are listed, but only abnormal results are displayed) Labs Reviewed  RESP PANEL BY RT-PCR (RSV, FLU A&B, COVID)  RVPGX2 - Abnormal; Notable for the following components:      Result Value   SARS Coronavirus 2 by RT PCR POSITIVE (*)    All other components within normal limits  BASIC METABOLIC PANEL - Abnormal; Notable for the following components:   Sodium 132 (*)    CO2 20 (*)    Glucose, Bld 458 (*)    All other components within normal limits  CBC - Abnormal; Notable for the following components:   RBC 5.48 (*)    All other components within normal limits  URINALYSIS, ROUTINE W REFLEX MICROSCOPIC - Abnormal; Notable for the following components:   Color, Urine STRAW (*)    Glucose, UA >=500 (*)    All other components within normal limits  CBG MONITORING, ED - Abnormal; Notable for the following components:   Glucose-Capillary 324 (*)    All other components within normal limits  MAGNESIUM    EKG EKG Interpretation  Date/Time:  Monday December 19 2022 12:38:26 EST Ventricular Rate:  122 PR Interval:  138 QRS Duration: 70 QT Interval:  310 QTC Calculation: 441 R Axis:   -32 Text Interpretation: Sinus tachycardia Left axis deviation Cannot rule out Anterior infarct (cited on or before 04-Mar-2022) Abnormal ECG When compared with ECG of 04-Mar-2022 17:35, No significant change was found Confirmed by Margaretmary Eddy [phone]) on 12/19/2022 1:04:48 PM  Radiology No results found.  Procedures Procedures   Medications Ordered in ED Medications  lactated ringers bolus 1,000 mL (0 mLs Intravenous Stopped 12/19/22 1625)  acetaminophen (TYLENOL) tablet 1,000 mg (1,000 mg Oral Given 12/19/22 1259)  lactated ringers bolus 1,000 mL (0 mLs Intravenous Stopped 12/19/22 1504)  ketorolac (TORADOL) 15 MG/ML injection 15 mg (15 mg Intravenous Given 12/19/22 1504)    ED Course/ Medical Decision Making/ A&P                             Medical Decision Making Amount and/or Complexity of Data Reviewed Labs: ordered.  Risk OTC drugs. Prescription drug management.   BAILLIE NASON is a 47 y.o. female with comorbidities that complicate the patient evaluation including diabetes, hypertension, and hyperlipidemia who presents emergency department with viral symptoms and diarrhea  Initial Ddx:  Gastroenteritis, COVID/flu, pneumonia, intra-abdominal abscess/Infection, electrolyte abnormality, dehydration  MDM:  With the patient's viral syndrome and sick contact and her daughter feel that she likely has gastroenteritis.  Will also test for COVID and flu since they sometimes can have a GI prodrome.  Will check lab work to assess for electrolyte abnormality.  Plan:  Labs COVID/flu  ED Summary/Re-evaluation:  Patient found to be hyperglycemic without signs of DKA.  Urine was obtained that showed no ketones.  She was found to be COVID-positive.  Given several liters of fluids and her vital signs normalized along with Tylenol and Toradol.  Patient  discharged home with Paxlovid since her symptoms appear to  have started yesterday.  Instructed to call her primary doctor for follow-up in 2 to 3 days.  This patient presents to the ED for concern of complaints listed in HPI, this involves an extensive number of treatment options, and is a complaint that carries with it a high risk of complications and morbidity. Disposition including potential need for admission considered.   Dispo: DC Home. Return precautions discussed including, but not limited to, those listed in the AVS. Allowed pt time to ask questions which were answered fully prior to dc.  Records reviewed Outpatient Clinic Notes The following labs were independently interpreted: Chemistry and show  hyperglycemia without DKA I personally reviewed and interpreted cardiac monitoring: normal sinus rhythm  and sinus tachycardia I personally reviewed and interpreted the pt's EKG: see above for interpretation  I have reviewed the patients home medications and made adjustments as needed  Final Clinical Impression(s) / ED Diagnoses Final diagnoses:  COVID-19  Hyperglycemia    Rx / DC Orders ED Discharge Orders          Ordered    nirmatrelvir & ritonavir (PAXLOVID, 300/100,) 20 x 150 MG & 10 x 100MG TBPK  2 times daily        12/19/22 1647              Fransico Meadow, MD 12/25/22 [phone]

## 2022-12-27 ENCOUNTER — Other Ambulatory Visit (HOSPITAL_COMMUNITY): Payer: Self-pay

## 2023-01-02 ENCOUNTER — Other Ambulatory Visit (HOSPITAL_COMMUNITY): Payer: Self-pay

## 2023-01-02 ENCOUNTER — Other Ambulatory Visit: Payer: Self-pay

## 2023-01-02 MED ORDER — MOUNJARO 5 MG/0.5ML ~~LOC~~ SOAJ
5.0000 mg | SUBCUTANEOUS | 0 refills | Status: DC
  Filled 2023-01-02: qty 2, 28d supply, fill #0

## 2023-01-02 MED ORDER — MOUNJARO 2.5 MG/0.5ML ~~LOC~~ SOAJ
2.5000 mg | SUBCUTANEOUS | 0 refills | Status: DC
  Filled 2023-01-02: qty 2, 28d supply, fill #0

## 2023-01-03 ENCOUNTER — Other Ambulatory Visit: Payer: Self-pay

## 2023-01-04 ENCOUNTER — Other Ambulatory Visit (HOSPITAL_COMMUNITY): Payer: Self-pay

## 2023-01-06 ENCOUNTER — Other Ambulatory Visit (HOSPITAL_COMMUNITY): Payer: Self-pay

## 2023-01-06 ENCOUNTER — Telehealth: Payer: Self-pay | Admitting: Internal Medicine

## 2023-01-06 NOTE — Telephone Encounter (Signed)
For our records:  We have received FMLA forms for the pt and they have been placed in Dr. Judeen Hammans boxes.   Upon completion please fax to:  [phone]

## 2023-01-07 ENCOUNTER — Other Ambulatory Visit (HOSPITAL_COMMUNITY): Payer: Self-pay

## 2023-01-09 NOTE — Telephone Encounter (Signed)
We have received another copy of the FMLA paperwork, it has been printed off and will be in the providers box up front.

## 2023-01-09 NOTE — Telephone Encounter (Signed)
Rec'd FMLA place in MD purple folder to complete.Marland KitchenJohny Chess

## 2023-01-10 ENCOUNTER — Other Ambulatory Visit: Payer: Self-pay

## 2023-01-10 ENCOUNTER — Other Ambulatory Visit (HOSPITAL_COMMUNITY): Payer: Self-pay

## 2023-01-10 MED ORDER — SPIRONOLACTONE 50 MG PO TABS
50.0000 mg | ORAL_TABLET | Freq: Every day | ORAL | 11 refills | Status: DC
  Filled 2023-01-10: qty 30, 30d supply, fill #0
  Filled 2023-02-17: qty 30, 30d supply, fill #1

## 2023-01-12 ENCOUNTER — Other Ambulatory Visit: Payer: Self-pay

## 2023-01-13 ENCOUNTER — Other Ambulatory Visit (HOSPITAL_COMMUNITY): Payer: Self-pay

## 2023-01-13 ENCOUNTER — Encounter (HOSPITAL_COMMUNITY)
Admission: RE | Admit: 2023-01-13 | Discharge: 2023-01-13 | Disposition: A | Discharge status: Home / Self Care | Payer: Commercial Managed Care - PPO | Source: Ambulatory Visit | Attending: Gastroenterology | Admitting: Gastroenterology

## 2023-01-13 DIAGNOSIS — K50818 Crohn's disease of both small and large intestine with other complication: Secondary | ICD-10-CM | POA: Insufficient documentation

## 2023-01-13 MED ORDER — SODIUM CHLORIDE 0.9 % IV SOLN
5.0000 mg/kg | INTRAVENOUS | Status: DC
  Administered 2023-01-13: 400 mg via INTRAVENOUS
  Filled 2023-01-13: qty 40

## 2023-01-14 ENCOUNTER — Other Ambulatory Visit (HOSPITAL_COMMUNITY): Payer: Self-pay

## 2023-01-16 ENCOUNTER — Other Ambulatory Visit: Payer: Self-pay

## 2023-01-16 ENCOUNTER — Telehealth: Payer: Self-pay | Admitting: Nutrition

## 2023-01-16 ENCOUNTER — Other Ambulatory Visit (HOSPITAL_COMMUNITY): Payer: Self-pay

## 2023-01-16 NOTE — Telephone Encounter (Signed)
Okay. Thank you.

## 2023-01-16 NOTE — Telephone Encounter (Signed)
Notified pt FMLA has been faxed back to [employer]. Original is ready for pick-up.Marland KitchenJohny Long

## 2023-01-16 NOTE — Telephone Encounter (Signed)
LVM to call me for pump training appointment

## 2023-01-17 ENCOUNTER — Other Ambulatory Visit: Payer: Self-pay

## 2023-01-23 ENCOUNTER — Other Ambulatory Visit (HOSPITAL_COMMUNITY): Payer: Self-pay

## 2023-01-23 ENCOUNTER — Other Ambulatory Visit: Payer: Self-pay

## 2023-01-24 ENCOUNTER — Other Ambulatory Visit (HOSPITAL_COMMUNITY): Payer: Self-pay

## 2023-01-27 DIAGNOSIS — Z794 Long term (current) use of insulin: Secondary | ICD-10-CM | POA: Diagnosis not present

## 2023-01-27 DIAGNOSIS — E1165 Type 2 diabetes mellitus with hyperglycemia: Secondary | ICD-10-CM | POA: Diagnosis not present

## 2023-01-27 DIAGNOSIS — I10 Essential (primary) hypertension: Secondary | ICD-10-CM | POA: Diagnosis not present

## 2023-01-27 DIAGNOSIS — I2510 Atherosclerotic heart disease of native coronary artery without angina pectoris: Secondary | ICD-10-CM | POA: Diagnosis not present

## 2023-01-30 ENCOUNTER — Telehealth: Payer: Self-pay | Admitting: Nutrition

## 2023-01-30 NOTE — Telephone Encounter (Signed)
Message left on my phone that she can only come in to start her Omnipod 5 pump on Fridays.  Will send a message to Levada Dy to see if she can do this.

## 2023-02-01 ENCOUNTER — Other Ambulatory Visit (HOSPITAL_COMMUNITY): Payer: Self-pay

## 2023-02-01 ENCOUNTER — Telehealth: Payer: Self-pay | Admitting: Registered"

## 2023-02-01 ENCOUNTER — Telehealth: Payer: Self-pay | Admitting: Nutrition

## 2023-02-01 NOTE — Telephone Encounter (Signed)
Pump start orders plus phone consult to Mechele Collin that we are not able to do a Friday pump training.  Cindy Long reported that she will schedule the training with her on a Friday.

## 2023-02-01 NOTE — Telephone Encounter (Signed)
Cindy Long received Omnipod Insulin pump therapy order from Dr. Cindra Eves office for this patient. Pt states she is only available on Fridays to schedule appts. [facility] Cindy Long) was going to try to accommodate patient's Friday schedule. After discussing next steps since she doesn't have the kit yet and she would need a couple of Friday appts, I called pt to let her know that having a trainer from the [facility] would be able to better fit her limited availability better. Neither of the [employer] trainers are scheduled to work on Fridays.   Pt states she will contact her doctor and let him know he needs to order the Omnipod kit so she can register the product and be contacted by the company for training.

## 2023-02-02 ENCOUNTER — Other Ambulatory Visit (HOSPITAL_COMMUNITY): Payer: Self-pay

## 2023-02-02 MED ORDER — MOUNJARO 5 MG/0.5ML ~~LOC~~ SOAJ
5.0000 mg | SUBCUTANEOUS | 0 refills | Status: DC
  Filled 2023-02-02: qty 2, 28d supply, fill #0

## 2023-02-03 ENCOUNTER — Other Ambulatory Visit (HOSPITAL_COMMUNITY): Payer: Self-pay

## 2023-02-06 ENCOUNTER — Other Ambulatory Visit (HOSPITAL_COMMUNITY): Payer: Self-pay

## 2023-02-07 NOTE — Telephone Encounter (Signed)
Levada Dy not able to do pump training on Fridays.  OmniPod rep. Christina notified called and pump start orders faxed to her.  She said she would contact patient to arrange pump training.

## 2023-02-13 ENCOUNTER — Other Ambulatory Visit (HOSPITAL_COMMUNITY): Payer: Self-pay

## 2023-02-14 ENCOUNTER — Telehealth: Payer: Self-pay | Admitting: Internal Medicine

## 2023-02-14 NOTE — Telephone Encounter (Signed)
Completed FMLA place on MD desk for signature.Marland KitchenRaechel Chute

## 2023-02-14 NOTE — Telephone Encounter (Signed)
We have received FMLA PW in the fax, to be filled out by provider. Patient requested to send it via Fax within 7-days. Document is located in providers tray at front office.Please advise at Mobile [phone] (mobile)   Please fax to:[phone]

## 2023-02-15 ENCOUNTER — Encounter: Payer: Self-pay | Admitting: Registered"

## 2023-02-15 NOTE — Progress Notes (Signed)
Dr. Sharl Ma had provided Cristy Folks with Omnipod 5 pump therapy order form, but when [facility] contacted patient found out that Fridays were the only day she is available. I also reached out to see if I could make it work in my schedule, but I have limited availability on Fridays as well. Pt will need to go through [employer] to accommodate her schedule.  Randall An from [place] reached out to me to get contact information for this patient. I provided pt name and phone number via secure email.

## 2023-02-15 NOTE — Telephone Encounter (Signed)
Ok thx.

## 2023-02-15 NOTE — Telephone Encounter (Signed)
Inform pt faxed FMLA back to [employer] put original in her office.Marland KitchenRaechel Chute

## 2023-02-17 ENCOUNTER — Other Ambulatory Visit: Payer: Self-pay | Admitting: Internal Medicine

## 2023-02-17 ENCOUNTER — Encounter: Payer: Self-pay | Admitting: Internal Medicine

## 2023-02-17 MED ORDER — "BD LUER-LOK SYRINGE 25G X 5/8"" 3 ML MISC"
3 refills | Status: DC
  Filled 2023-02-17: qty 50, 50d supply, fill #0
  Filled 2023-03-08: qty 6, 84d supply, fill #0
  Filled 2023-03-08: qty 50, fill #0
  Filled 2023-04-13: qty 6, 84d supply, fill #1

## 2023-02-17 MED ORDER — CYANOCOBALAMIN 1000 MCG/ML IJ SOLN
INTRAMUSCULAR | 6 refills | Status: AC
  Filled 2023-02-17: qty 6, 84d supply, fill #0
  Filled 2023-04-13: qty 6, fill #1

## 2023-02-19 ENCOUNTER — Other Ambulatory Visit: Payer: Self-pay | Admitting: Internal Medicine

## 2023-02-19 MED ORDER — RIZATRIPTAN BENZOATE 10 MG PO TABS
10.0000 mg | ORAL_TABLET | Freq: Once | ORAL | 3 refills | Status: DC | PRN
  Filled 2023-02-19: qty 12, 24d supply, fill #0
  Filled 2023-03-21: qty 12, 24d supply, fill #1

## 2023-02-20 ENCOUNTER — Ambulatory Visit: Payer: Commercial Managed Care - PPO | Admitting: Internal Medicine

## 2023-02-20 ENCOUNTER — Other Ambulatory Visit (HOSPITAL_COMMUNITY): Payer: Self-pay

## 2023-02-20 ENCOUNTER — Other Ambulatory Visit: Payer: Self-pay

## 2023-02-20 ENCOUNTER — Other Ambulatory Visit: Payer: Commercial Managed Care - PPO

## 2023-02-20 ENCOUNTER — Encounter: Payer: Self-pay | Admitting: Internal Medicine

## 2023-02-20 VITALS — BP 114/82 | HR 113 | Temp 98.2°F | Ht 66.0 in | Wt 155.0 lb

## 2023-02-20 DIAGNOSIS — R479 Unspecified speech disturbances: Secondary | ICD-10-CM

## 2023-02-20 DIAGNOSIS — R000 Tachycardia, unspecified: Secondary | ICD-10-CM

## 2023-02-20 DIAGNOSIS — Z794 Long term (current) use of insulin: Secondary | ICD-10-CM | POA: Diagnosis not present

## 2023-02-20 DIAGNOSIS — E1165 Type 2 diabetes mellitus with hyperglycemia: Secondary | ICD-10-CM | POA: Diagnosis not present

## 2023-02-20 DIAGNOSIS — I10 Essential (primary) hypertension: Secondary | ICD-10-CM | POA: Diagnosis not present

## 2023-02-20 DIAGNOSIS — G43809 Other migraine, not intractable, without status migrainosus: Secondary | ICD-10-CM

## 2023-02-20 LAB — CBC WITH DIFFERENTIAL/PLATELET
Basophils Absolute: 0 10*3/uL (ref 0.0–0.1)
Basophils Relative: 0.2 % (ref 0.0–3.0)
Eosinophils Absolute: 0 10*3/uL (ref 0.0–0.7)
Eosinophils Relative: 0.5 % (ref 0.0–5.0)
HCT: 50.5 % — ABNORMAL HIGH (ref 36.0–46.0)
Hemoglobin: 16.9 g/dL — ABNORMAL HIGH (ref 12.0–15.0)
Lymphocytes Relative: 23.6 % (ref 12.0–46.0)
Lymphs Abs: 2.1 10*3/uL (ref 0.7–4.0)
MCHC: 33.4 g/dL (ref 30.0–36.0)
MCV: 83.7 fl (ref 78.0–100.0)
Monocytes Absolute: 0.4 10*3/uL (ref 0.1–1.0)
Monocytes Relative: 4.6 % (ref 3.0–12.0)
Neutro Abs: 6.4 10*3/uL (ref 1.4–7.7)
Neutrophils Relative %: 71.1 % (ref 43.0–77.0)
Platelets: 278 10*3/uL (ref 150.0–400.0)
RBC: 6.04 Mil/uL — ABNORMAL HIGH (ref 3.87–5.11)
RDW: 13 % (ref 11.5–15.5)
WBC: 9 10*3/uL (ref 4.0–10.5)

## 2023-02-20 LAB — COMPREHENSIVE METABOLIC PANEL
ALT: 34 U/L (ref 0–35)
AST: 28 U/L (ref 0–37)
Albumin: 4.8 g/dL (ref 3.5–5.2)
Alkaline Phosphatase: 77 U/L (ref 39–117)
BUN: 8 mg/dL (ref 6–23)
CO2: 22 mEq/L (ref 19–32)
Calcium: 10 mg/dL (ref 8.4–10.5)
Chloride: 102 mEq/L (ref 96–112)
Creatinine, Ser: 0.76 mg/dL (ref 0.40–1.20)
GFR: 94.9 mL/min (ref >60.00)
Glucose, Bld: 229 mg/dL — ABNORMAL HIGH (ref 70–99)
Potassium: 4.3 mEq/L (ref 3.5–5.1)
Sodium: 136 mEq/L (ref 135–145)
Total Bilirubin: 1.2 mg/dL (ref 0.2–1.2)
Total Protein: 8.4 g/dL — ABNORMAL HIGH (ref 6.0–8.3)

## 2023-02-20 LAB — HEMOGLOBIN A1C
Hgb A1c MFr Bld: 11 % of total Hgb — ABNORMAL HIGH (ref <5.7)
Mean Plasma Glucose: 269 mg/dL
eAG (mmol/L): 14.9 mmol/L

## 2023-02-20 LAB — TROPONIN I (HIGH SENSITIVITY): High Sens Troponin I: 3 ng/L (ref 2–17)

## 2023-02-20 MED ORDER — SPIRONOLACTONE 50 MG PO TABS: 25.0000 mg | ORAL_TABLET | Freq: Every day | ORAL | 11 refills | Status: DC

## 2023-02-20 NOTE — Patient Instructions (Addendum)
   Samples of nurtec given.   Blood work was ordered.   The lab is on the first floor.    Medications changes include :   decrease spironolactone to 25 mg daily.  Restart the ativan for now.     An MRI of your brain was ordered.     A referral was ordered for Dr Lucia Gaskins - neurology.     Someone will call you to schedule an appointment.

## 2023-02-20 NOTE — Progress Notes (Signed)
Subjective:    Patient ID: Cindy Long, female    DOB: 10-20-1976, 47 y.o.   MRN: [id number]      HPI Cindy Long is here for  Chief Complaint  Patient presents with   Palpitations    Diarrhea, elevated heart rate, headaches, loss of appetite,dizzy and chest pains    Sinus tach on EKG 110-116  SOB yesterday and today - when HR was elevated.   Having headaches - left temple and left posterior head.  Started over a month ago.  Not a typical migraines.  Throbbing feeling.    This morning she woke up with palpitations - HR 155.  She had diarrhea this morning.  This morning she had difficulty speaking - she knew what she wanted to say but could not get the words out.    Decreased appetite.  Drinks a lot of water.     BP 96/? At Dr Daune Perch office,  not high at home.   Medications and allergies reviewed with patient and updated if appropriate.  Current Outpatient Medications on File Prior to Visit  Medication Sig Dispense Refill   acetaminophen (TYLENOL) 500 MG tablet Take 1,000 mg by mouth every 6 (six) hours as needed for moderate pain or headache.     albuterol (VENTOLIN HFA) 108 (90 Base) MCG/ACT inhaler Inhale 1-2 puffs into the lungs every 6 (six) hours as needed for wheezing or shortness of breath. 18 g 5   aspirin EC 81 MG tablet Take 1 tablet (81 mg total) by mouth daily. Swallow whole. 90 tablet 3   Bacillus Coagulans-Inulin (PROBIOTIC) 1-250 BILLION-MG CAPS Take 1 capsule by mouth daily.     Continuous Blood Gluc Sensor (DEXCOM G7 SENSOR) MISC Change sensor every 10 days 9 each 4   cyanocobalamin (VITAMIN B12) 1000 MCG/ML injection Inject 1 ml subcutaneously once daily for 1 week-- then inject weekly for 1 month-- then inject once every 2 weeks 10 mL 6   dapagliflozin propanediol (FARXIGA) 10 MG TABS tablet Take 1 tablet (10 mg total) by mouth daily. 30 tablet 5   dicyclomine (BENTYL) 20 MG tablet Take 1 tablet (20 mg total) by mouth 2 (two) times daily as needed for  abdominal cramping. 20 tablet 0   escitalopram (LEXAPRO) 20 MG tablet TAKE 2 TABLETS BY MOUTH ONCE A DAY. 180 tablet 3   fluconazole (DIFLUCAN) 150 MG tablet Take 1 tablet (150 mg total) by mouth every other day. 2 tablet 0   hyoscyamine (LEVBID) 0.375 MG 12 hr tablet Take 1 tablet  by mouth as needed as directed (30 days). 30 tablet 1   Insulin Disposable Pump (OMNIPOD 5 G6 INTRO, GEN 5,) KIT Change pod every 2 days as directed (TDD of 100 units) 1 kit 0   Insulin Disposable Pump (OMNIPOD 5 G6 POD, GEN 5,) MISC Change pod every 2 days as directed (TDD of 100 units) 30 each 4   Insulin Pen Needle 32G X 4 MM MISC by Does not apply route. BD U/F Pen Needles Nano 4 mm x 32 G; use with insulin     insulin regular human CONCENTRATED (HUMULIN R U-500 KWIKPEN) 500 UNIT/ML KwikPen Inject 5-50 Units into the skin 3 (three) times daily with meals. Take 10 units in the morning, 45 units at lunch, 25 units at dinner. (Patient taking differently: Inject 5-50 Units into the skin 3 (three) times daily with meals. Take 25 units in the morning, 50 units at lunch, 25 units at dinner.) 24 mL  3   insulin regular human CONCENTRATED (HUMULIN R U-500 KWIKPEN) 500 UNIT/ML KwikPen Inject 10 units into the skin at breakfast, 40 units at lunch, 20 units at evening meal 24 mL 3   LORazepam (ATIVAN) 1 MG tablet Take 1/2 tablet by mouth once daily as needed 20 tablet 1   losartan (COZAAR) 50 MG tablet Take 1 tablet (50 mg total) by mouth daily. 90 tablet 1   meclizine (ANTIVERT) 12.5 MG tablet Take 1 tablet (12.5 mg total) by mouth 3 (three) times daily as needed for dizziness. 30 tablet 1   medroxyPROGESTERone Acetate 150 MG/ML SUSY Inject 1 syringe into the muscle every 11-12 weeks 1 mL 11   metoprolol succinate (TOPROL-XL) 25 MG 24 hr tablet Take 1 tablet (25 mg total) by mouth 2 (two) times daily. 180 tablet 3   nystatin-triamcinolone (MYCOLOG II) cream APPLY TO THE AFFECTED AREA(S) TWO TIMES DAILY AS DIRECTED (Patient taking  differently: Apply 1 application  topically 2 (two) times daily as needed (rash).) 20 g 2   promethazine (PHENERGAN) 25 MG tablet TAKE 1 TABLET BY MOUTH EVERY 8 HOURS (Patient taking differently: Take 25 mg by mouth every 8 (eight) hours as needed for vomiting or nausea.) 90 tablet 1   promethazine-dextromethorphan (PROMETHAZINE-DM) 6.25-15 MG/5ML syrup Take 5 mLs by mouth 4 (four) times daily as needed for cough. 150 mL 0   rizatriptan (MAXALT) 10 MG tablet Take 1 tablet (10 mg total) by mouth once as needed for up to 1 dose for migraine (Migraine headaches). May repeat in 2 hours if needed 12 tablet 3   spironolactone (ALDACTONE) 50 MG tablet Take 1 tablet (50 mg total) by mouth daily. 30 tablet 11   SYRINGE-NEEDLE, DISP, 3 ML (B-D 3CC LUER-LOK SYR 25GX5/8") 25G X 5/8" 3 ML MISC Use to inject B12 under the skin 50 each 3   tirzepatide (MOUNJARO) 2.5 MG/0.5ML Pen Inject 2.5 mg into the skin once a week. 2 mL 0   tirzepatide (MOUNJARO) 5 MG/0.5ML Pen Inject 5 mg into the skin every 7 (seven) days. 2 mL 0   ustekinumab (STELARA) 130 MG/26ML SOLN injection Infuse 3 vials IV over at least 1 hour 78 mL 0   insulin glargine (LANTUS SOLOSTAR) 100 UNIT/ML Solostar Pen Inject 20 Units into the skin once for 1 dose. 0.2 mL 0   No current facility-administered medications on file prior to visit.    Review of Systems  Constitutional:  Positive for fatigue. Negative for fever.  HENT:  Negative for congestion and sore throat.   Respiratory:  Positive for shortness of breath. Negative for cough and wheezing.   Cardiovascular:  Positive for chest pain (twiinges of pain - transient) and palpitations. Negative for leg swelling.  Gastrointestinal:  Positive for diarrhea. Negative for abdominal pain, blood in stool and nausea.       No reflux  Genitourinary:  Negative for dysuria, frequency and hematuria.  Neurological:  Positive for dizziness, speech difficulty, numbness (hands) and headaches. Negative for  weakness and light-headedness.       Objective:   Vitals:   02/20/23 0920  BP: 114/82  Pulse: (!) 113  Temp: 98.2 F (36.8 C)  SpO2: 98%   BP Readings from Last 3 Encounters:  02/20/23 114/82  01/13/23 122/81  12/21/22 124/78   Wt Readings from Last 3 Encounters:  02/20/23 155 lb (70.3 kg)  01/13/23 165 lb (74.8 kg)  12/21/22 167 lb (75.8 kg)   Body mass index is 25.02 kg/m.  Physical Exam Constitutional:      General: She is not in acute distress.    Appearance: Normal appearance.  HENT:     Head: Normocephalic and atraumatic.  Eyes:     Conjunctiva/sclera: Conjunctivae normal.  Cardiovascular:     Rate and Rhythm: Regular rhythm. Tachycardia present.     Heart sounds: Normal heart sounds.  Pulmonary:     Effort: Pulmonary effort is normal. No respiratory distress.     Breath sounds: Normal breath sounds. No wheezing.  Abdominal:     General: There is no distension.     Palpations: Abdomen is soft.     Tenderness: There is no abdominal tenderness.  Musculoskeletal:     Cervical back: Neck supple.     Right lower leg: No edema.     Left lower leg: No edema.  Lymphadenopathy:     Cervical: No cervical adenopathy.  Skin:    General: Skin is warm and dry.     Findings: No rash.  Neurological:     General: No focal deficit present.     Mental Status: She is alert and oriented to person, place, and time. Mental status is at baseline.     Comments: No difficulty with speech  Psychiatric:        Mood and Affect: Mood normal.        Behavior: Behavior normal.            Assessment & Plan:    Sinus tachycardia, hypertension: Acute on chronic No obvious cause Woke up this morning with heart rate of 155, EKG here sinus tachycardia at 110, possible LAE.  No significant change compared to last EKG 12/2022. Would likely benefit from an increased dose of metoprolol, but BP is too low Denies frequent episodes of SVT Given weight loss and BP lower than  recent endocrine visit 96/?  Will decrease spironolactone to 25 mg daily Monitor BP If SVT persists may need to increase metoprolol She is staying hydrated CBC, CMP, A1c, troponin given her history  Headache, migraines, speech difficulty: Has been experiencing increased headaches ?  Migraine versus other Used to see Dr. Neale Burly for her migraines-not currently seeing anyone Has had some episodes of difficulty with speech-knows what she wants to say but is not able to say MRI brain ordered Referral for neurology-Dr. Lucia Gaskins Has been prescribed Maxalt, but has not picked up the prescription Sample of Nurtec given to see if that helps Slight polycythemia-?  OSA

## 2023-02-21 ENCOUNTER — Other Ambulatory Visit: Payer: Self-pay | Admitting: Internal Medicine

## 2023-02-21 ENCOUNTER — Other Ambulatory Visit: Payer: Self-pay

## 2023-02-21 MED ORDER — METOPROLOL SUCCINATE ER 25 MG PO TB24: 37.5000 mg | ORAL_TABLET | Freq: Two times a day (BID) | ORAL | 3 refills | Status: DC

## 2023-02-22 ENCOUNTER — Telehealth: Payer: Self-pay

## 2023-02-22 ENCOUNTER — Ambulatory Visit (INDEPENDENT_AMBULATORY_CARE_PROVIDER_SITE_OTHER): Payer: Commercial Managed Care - PPO

## 2023-02-22 DIAGNOSIS — G43809 Other migraine, not intractable, without status migrainosus: Secondary | ICD-10-CM | POA: Diagnosis not present

## 2023-02-22 MED ORDER — METHYLPREDNISOLONE ACETATE 40 MG/ML IJ SUSP
40.0000 mg | Freq: Once | INTRAMUSCULAR | Status: AC
  Administered 2023-02-22: 40 mg via INTRAMUSCULAR

## 2023-02-22 NOTE — Telephone Encounter (Signed)
Ok for depo-medro 40 mg

## 2023-02-22 NOTE — Telephone Encounter (Signed)
Rec'd forms will give to [facility] w/ Dr. Lawerance Bach. Saw MD on 02/20/23 for Migraines.Marland KitchenRaechel Long

## 2023-02-22 NOTE — Telephone Encounter (Signed)
noted 

## 2023-02-22 NOTE — Telephone Encounter (Signed)
We have received another copy of FMLA forms to be filled out by provider. Patient requested to send it via Fax within 7-days. Document is located in providers tray at front office.Please advise at Mobile [phone] (mobile)   Please fax to:  [phone]

## 2023-02-22 NOTE — Progress Notes (Signed)
After obtaining consent, and per orders of Dr. Lawerance Bach, injection of Depo 40mg  given by Ferdie Ping. Patient instructed toreport any adverse reaction to me immediately.

## 2023-02-24 ENCOUNTER — Encounter: Payer: Self-pay | Admitting: Internal Medicine

## 2023-02-24 ENCOUNTER — Ambulatory Visit: Payer: Commercial Managed Care - PPO | Admitting: Family Medicine

## 2023-02-24 ENCOUNTER — Ambulatory Visit
Admission: RE | Admit: 2023-02-24 | Discharge: 2023-02-24 | Disposition: A | Discharge status: Home / Self Care | Payer: Commercial Managed Care - PPO | Source: Ambulatory Visit | Attending: Internal Medicine | Admitting: Internal Medicine

## 2023-02-24 DIAGNOSIS — G43809 Other migraine, not intractable, without status migrainosus: Secondary | ICD-10-CM

## 2023-02-24 DIAGNOSIS — G43009 Migraine without aura, not intractable, without status migrainosus: Secondary | ICD-10-CM | POA: Diagnosis not present

## 2023-02-24 DIAGNOSIS — K5010 Crohn's disease of large intestine without complications: Secondary | ICD-10-CM | POA: Diagnosis not present

## 2023-02-24 DIAGNOSIS — Z0289 Encounter for other administrative examinations: Secondary | ICD-10-CM

## 2023-02-24 MED ORDER — GADOPICLENOL 0.5 MMOL/ML IV SOLN
7.0000 mL | Freq: Once | INTRAVENOUS | Status: AC | PRN
  Administered 2023-02-24: 7 mL via INTRAVENOUS

## 2023-02-24 NOTE — Telephone Encounter (Signed)
FMLA completed today.  Placed in folder to be signed.

## 2023-03-01 ENCOUNTER — Encounter: Payer: Self-pay | Admitting: Internal Medicine

## 2023-03-02 ENCOUNTER — Other Ambulatory Visit: Payer: Self-pay | Admitting: Internal Medicine

## 2023-03-02 ENCOUNTER — Other Ambulatory Visit (HOSPITAL_COMMUNITY): Payer: Self-pay

## 2023-03-03 ENCOUNTER — Other Ambulatory Visit (HOSPITAL_COMMUNITY): Payer: Self-pay

## 2023-03-03 ENCOUNTER — Encounter: Payer: Self-pay | Admitting: Pharmacist

## 2023-03-03 ENCOUNTER — Other Ambulatory Visit: Payer: Self-pay

## 2023-03-03 MED ORDER — LANTUS SOLOSTAR 100 UNIT/ML ~~LOC~~ SOPN: 20.0000 [IU] | PEN_INJECTOR | Freq: Once | SUBCUTANEOUS | 0 refills | Status: DC

## 2023-03-03 MED ORDER — AMOXICILLIN-POT CLAVULANATE 875-125 MG PO TABS
1.0000 | ORAL_TABLET | Freq: Two times a day (BID) | ORAL | 0 refills | Status: AC
  Filled 2023-03-03: qty 20, 10d supply, fill #0

## 2023-03-03 MED ORDER — MOUNJARO 5 MG/0.5ML ~~LOC~~ SOAJ
5.0000 mg | SUBCUTANEOUS | 0 refills | Status: DC
  Filled 2023-03-03: qty 2, 28d supply, fill #0

## 2023-03-08 ENCOUNTER — Other Ambulatory Visit: Payer: Self-pay | Admitting: Internal Medicine

## 2023-03-08 ENCOUNTER — Other Ambulatory Visit (HOSPITAL_COMMUNITY): Payer: Self-pay

## 2023-03-08 ENCOUNTER — Other Ambulatory Visit: Payer: Self-pay

## 2023-03-08 MED ORDER — LANTUS SOLOSTAR 100 UNIT/ML ~~LOC~~ SOPN: 20.0000 [IU] | PEN_INJECTOR | Freq: Once | SUBCUTANEOUS | 0 refills | Status: DC

## 2023-03-09 ENCOUNTER — Other Ambulatory Visit: Payer: Self-pay

## 2023-03-10 ENCOUNTER — Encounter (HOSPITAL_COMMUNITY)
Admission: RE | Admit: 2023-03-10 | Discharge: 2023-03-10 | Disposition: A | Discharge status: Home / Self Care | Payer: Commercial Managed Care - PPO | Source: Ambulatory Visit | Attending: Gastroenterology | Admitting: Gastroenterology

## 2023-03-10 DIAGNOSIS — K50818 Crohn's disease of both small and large intestine with other complication: Secondary | ICD-10-CM | POA: Diagnosis present

## 2023-03-10 MED ORDER — SODIUM CHLORIDE 0.9 % IV SOLN
5.0000 mg/kg | INTRAVENOUS | Status: DC
  Filled 2023-03-10: qty 40

## 2023-03-10 MED ORDER — SODIUM CHLORIDE 0.9 % IV SOLN
5.0000 mg/kg | Freq: Once | INTRAVENOUS | Status: AC
  Administered 2023-03-10: 400 mg via INTRAVENOUS
  Filled 2023-03-10: qty 40

## 2023-03-10 NOTE — Progress Notes (Deleted)
Office Visit Note  Patient: Cindy Long             Date of Birth: September 03, 1976           MRN: [id number]             PCP: Tresa Garter, MD Referring: Tresa Garter, MD Visit Date: 03/24/2023 Occupation: @GUAROCC @  Subjective:  No chief complaint on file.   History of Present Illness: Cindy Long is a 47 y.o. female ***     Activities of Daily Living:  Patient reports morning stiffness for *** {minute/hour:19697}.   Patient {ACTIONS;DENIES/REPORTS:21021675::"Denies"} nocturnal pain.  Difficulty dressing/grooming: {ACTIONS;DENIES/REPORTS:21021675::"Denies"} Difficulty climbing stairs: {ACTIONS;DENIES/REPORTS:21021675::"Denies"} Difficulty getting out of chair: {ACTIONS;DENIES/REPORTS:21021675::"Denies"} Difficulty using hands for taps, buttons, cutlery, and/or writing: {ACTIONS;DENIES/REPORTS:21021675::"Denies"}  No Rheumatology ROS completed.   PMFS History:  Patient Active Problem List   Diagnosis Date Noted   Otitis externa 11/30/2022   Vertigo 11/30/2022   Crohn's disease of colon with complication (HCC) 07/17/2022   High risk medications (not anticoagulants) long-term use 07/17/2022   PSVT (paroxysmal supraventricular tachycardia) 06/08/2022   Anal fissure 05/19/2022   Fecal urgency 05/19/2022   Flatulence, eructation and gas pain 05/19/2022   Iron deficiency anemia 05/19/2022   Left lower quadrant pain 05/19/2022   Nausea 05/19/2022   Periumbilical pain 05/19/2022   Rectal pain 05/19/2022   Vomiting without nausea 05/19/2022   Thumb pain, right 05/19/2022   Well adult exam 05/19/2022   Crohn disease (HCC) 03/28/2022   Chest pain 03/05/2022   Dehydration 03/05/2022   Elevated troponin    Pain of left upper extremity 03/04/2022   Tachycardia 03/04/2022   Diarrhea 03/04/2022   Acute nonintractable headache 03/04/2022   Numbness and tingling in right hand 02/24/2022   Coronary artery disease 02/23/2022   Asthmatic bronchitis with  acute exacerbation 02/23/2022   Upper respiratory infection 02/23/2022   Adjustment insomnia 12/22/2021   Other specified anxiety disorders 12/22/2021   Crohn's disease of small and large intestines (HCC) 12/22/2021   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   Family history of breast cancer 12/22/2021   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   Gastroesophageal reflux disease 12/22/2021   Hyperglycemia due to type 2 diabetes mellitus (HCC) 12/22/2021   Intra-abdominal and pelvic swelling, mass and lump, unspecified site 12/22/2021   Long term (current) use of insulin (HCC) 12/22/2021   Mild intermittent asthma with (acute) exacerbation 12/22/2021   Pain in thoracic spine 12/22/2021   Rectal bleeding 12/22/2021   Right ankle sprain 12/22/2021   Crohn's disease (HCC) 12/13/2021   Vitamin D deficiency 12/13/2021   B12 deficiency 12/13/2021   Depression 12/13/2021   Chest pressure 11/18/2021   Palpitations 11/18/2021   Essential hypertension 11/18/2021   Pure hypercholesterolemia 11/18/2021   Patellofemoral arthritis 10/21/2021   Patellofemoral disorder, right 07/16/2021   Muscle atrophy 07/16/2021   Postpartum care following cesarean delivery (2/10) 12/17/2013   Indication for care in labor or delivery 11/30/2013   Preexisting diabetes complicating pregnancy in second trimester, antepartum 08/18/2013   IBS (irritable bowel syndrome) 02/05/2013   Dyslipidemia 02/05/2013   Abnormal uterine bleeding 06/18/2012   Pelvic pain 06/18/2012   Type II diabetes mellitus (HCC) 06/18/2012    Past Medical History:  Diagnosis Date   Chest pressure 11/18/2021   Crohn disease (HCC)    Diabetes mellitus    IDDM, Type 2   Essential hypertension 11/18/2021   GERD (gastroesophageal reflux disease)    no meds currently   High cholesterol  Hypertension    Mastitis    right breast   Postpartum care following cesarean delivery (2/10) 12/17/2013   Pure hypercholesterolemia 11/18/2021    Family History   Problem Relation Age of Onset   Hypertension Mother    Hypercholesterolemia Mother    Other Sister        brain tumor   Diabetes Maternal Aunt    Hypertension Maternal Grandmother    Cancer Maternal Grandmother        breast, colon   Diabetes Maternal Grandmother    Hypertension Maternal Grandfather    Heart attack Cousin 3   Healthy Daughter    Healthy Daughter    Past Surgical History:  Procedure Laterality Date   CERVICAL CERCLAGE     CERVICAL CERCLAGE N/A 06/21/2013   Procedure: McDonald CERCLAGE CERVICAL;  Surgeon: Serita Kyle, MD;  Location: [facility];  Service: Gynecology;  Laterality: N/A;   CESAREAN SECTION  2008   CESAREAN SECTION N/A 12/17/2013   Procedure: Repeat CESAREAN SECTION with Cerclage Removal;  Surgeon: Serita Kyle, MD;  Location: [facility];  Service: Obstetrics;  Laterality: N/A;  EDD: 12/22/13   CHOLECYSTECTOMY     CHOLECYSTECTOMY OPEN  08/08/2011   GANGLION CYST EXCISION Right 1997   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   Social History   Social History Narrative   Not on file   Immunization History  Administered Date(s) Administered   Hepatitis A, Adult 01/03/2019, 08/02/2019   Hepatitis B, ADULT 07/23/2016   Influenza Split 07/31/2015, 07/25/2016, 08/01/2019, 07/30/2020   Influenza Whole 07/25/2012   Influenza,inj,Quad PF,6+ Mos 07/31/2012, 08/01/2017, 08/10/2018   Influenza-Unspecified 07/30/2020, 07/25/2021, 08/18/2022   Moderna Sars-Covid-2 Vaccination 11/05/2019, 12/03/2019, 06/25/2020, 01/01/2021, 07/25/2021   Pneumococcal Conjugate-13 12/27/2018   Pneumococcal Polysaccharide-23 07/26/2016   Tdap 05/08/2015     Objective: Vital Signs: There were no vitals taken for this visit.   Physical Exam   Musculoskeletal Exam: ***  CDAI Exam: CDAI Score: -- Patient Global: --; Provider Global: -- Swollen: --; Tender: -- Joint Exam 03/24/2023   No joint exam has been documented for this visit   There is  currently no information documented on the homunculus. Go to the Rheumatology activity and complete the homunculus joint exam.  Investigation: No additional findings.  Imaging: MR Brain W Wo Contrast  Result Date: 02/28/2023 CLINICAL DATA:  Migraines EXAM: MRI HEAD WITHOUT AND WITH CONTRAST TECHNIQUE: Multiplanar, multiecho pulse sequences of the brain and surrounding structures were obtained without and with intravenous contrast. CONTRAST:  0.5 ml Vueway COMPARISON:  None Available. FINDINGS: Brain: Negative for an acute infarct. No hemorrhage. No extra-axial fluid collection. No hydrocephalus. No focal contrast-enhancing lesion is visualized. Vascular: Normal flow voids. On the post contrast-enhanced sequences there appears to be severe focal narrowing in the distal right M1. This may be artifactual. Consider further evaluation with an MRA for more definitive characterization., Skull and upper cervical spine: Normal marrow signal. Sinuses/Orbits: Middle ear or mastoid effusion. Air-fluid level in the right maxillary sinus, which can be seen in the setting of acute sinusitis. There is also mild mucosal thickening in the bilateral ethmoid sinuses. Orbits are unremarkable. Other: None. IMPRESSION: 1. No acute intracranial process. No specific etiology for headaches identified. 2. Air-fluid level in the right maxillary sinus, which can be seen in the setting of acute sinusitis. 3. On the post contrast-enhanced sequences there appears to be focal narrowing in the distal right M1. This may be artifactual. Consider further evaluation with an MRA for more  definitive characterization in the exclude possibility of vascular stenosis. Electronically Signed   By: Lorenza Cambridge M.D.   On: 02/28/2023 17:06    Recent Labs: Lab Results  Component Value Date   WBC 9.0 02/20/2023   HGB 16.9 (H) 02/20/2023   PLT 278.0 02/20/2023   NA 136 02/20/2023   K 4.3 02/20/2023   CL 102 02/20/2023   CO2 22 02/20/2023    GLUCOSE 229 (H) 02/20/2023   BUN 8 02/20/2023   CREATININE 0.76 02/20/2023   BILITOT 1.2 02/20/2023   ALKPHOS 77 02/20/2023   AST 28 02/20/2023   ALT 34 02/20/2023   PROT 8.4 (H) 02/20/2023   ALBUMIN 4.8 02/20/2023   CALCIUM 10.0 02/20/2023   GFRAA >60 11/12/2019    Speciality Comments: Imuran, Humira-inadequate response, IV Remicade July 22 -April 23-insurance St[employer] May 2023-present-inadequate response  Procedures:  No procedures performed Allergies: Metronidazole, Shellfish allergy, Empagliflozin-linagliptin, Lisinopril, Metformin hcl, Prednisone, Dulaglutide, and Liraglutide   Assessment / Plan:     Visit Diagnoses: No diagnosis found.  Orders: No orders of the defined types were placed in this encounter.  No orders of the defined types were placed in this encounter.   Face-to-face time spent with patient was *** minutes. Greater than 50% of time was spent in counseling and coordination of care.  Follow-Up Instructions: No follow-ups on file.   Ellen Henri, CMA  Note - This record has been created using Animal nutritionist.  Chart creation errors have been sought, but may not always  have been located. Such creation errors do not reflect on  the standard of medical care.

## 2023-03-10 NOTE — Progress Notes (Signed)
Went in client's room to increase rate of medication and noted medication infusing in the floor; infusion stopped and no infusion noted in bed

## 2023-03-10 NOTE — Progress Notes (Signed)
Dr Marca Ancona notified of client states has sinus infection and per Dr  Marca Ancona ok to give remicade

## 2023-03-13 ENCOUNTER — Other Ambulatory Visit: Payer: Self-pay

## 2023-03-17 ENCOUNTER — Other Ambulatory Visit: Payer: Self-pay

## 2023-03-17 ENCOUNTER — Ambulatory Visit: Payer: Commercial Managed Care - PPO | Admitting: Podiatry

## 2023-03-17 ENCOUNTER — Encounter: Payer: Self-pay | Admitting: Family Medicine

## 2023-03-17 ENCOUNTER — Other Ambulatory Visit (HOSPITAL_COMMUNITY): Payer: Self-pay

## 2023-03-17 ENCOUNTER — Ambulatory Visit: Payer: Commercial Managed Care - PPO | Admitting: Family Medicine

## 2023-03-17 VITALS — BP 116/74 | HR 95 | Temp 97.6°F | Ht 66.0 in | Wt 157.0 lb

## 2023-03-17 DIAGNOSIS — G43809 Other migraine, not intractable, without status migrainosus: Secondary | ICD-10-CM

## 2023-03-17 DIAGNOSIS — M542 Cervicalgia: Secondary | ICD-10-CM

## 2023-03-17 MED ORDER — INSULIN LISPRO 100 UNIT/ML IJ SOLN
INTRAMUSCULAR | 11 refills | Status: DC
  Filled 2023-03-17: qty 40, 28d supply, fill #0
  Filled 2023-04-13 – 2023-04-15 (×2): qty 40, 28d supply, fill #1
  Filled 2023-06-23 – 2023-06-26 (×2): qty 40, 28d supply, fill #2
  Filled 2023-08-21: qty 40, 28d supply, fill #3
  Filled 2023-10-08: qty 40, 28d supply, fill #4
  Filled 2024-02-15 – 2024-02-17 (×2): qty 40, 28d supply, fill #5

## 2023-03-17 NOTE — Progress Notes (Unsigned)
Subjective:     Patient ID: Cindy Long, female    DOB: Mar 12, 1976, 47 y.o.   MRN: [id number]  Chief Complaint  Patient presents with   Headache    Constant headaches has been going on for a month in a half, saw Dr. Lawerance Bach. MRI showed sinuitis. Referred to neurologist but can't get in until June. Still havning some speech delay  Nurtec caused a rash and maxalt hasn't helped.   Neck Pain    Left sided neck pain and eyes on left side with headaches    Headache  Associated symptoms include ear pain, eye pain and neck pain. Pertinent negatives include no abdominal pain, blurred vision, dizziness, fever, nausea, photophobia, sore throat, tingling, vomiting or weakness.  Neck Pain  Associated symptoms include headaches. Pertinent negatives include no chest pain, fever, photophobia, tingling or weakness.   Patient is in today for a left sided headache that started after waking up today. Feels like pressure and occasionally throbs.  States she has pain behind her left eye. No double or blurred vision. Upcoming eye appt for vision change in left eye. She also has left earache, improving with antibiotic drops. Very tired.   Left sided neck pain that improved with a heating pad. Good range of motion.   She has a hx of similar headaches but this one felt worse until she drank caffeine. Now her pain is a 2/10.   No numbness, tingling or weakness.    She had to reschedule her eye doctor appt last week.   Health Maintenance Due  Topic Date Due   Diabetic kidney evaluation - Urine ACR  Never done   Hepatitis C Screening  Never done   PAP SMEAR-Modifier  01/06/2014   COVID-19 Vaccine (6 - 2023-24 season) 07/08/2022   COLONOSCOPY (Pts 45-48yrs Insurance coverage will need to be confirmed)  Never done    Past Medical History:  Diagnosis Date   Chest pressure 11/18/2021   Crohn disease (HCC)    Diabetes mellitus    IDDM, Type 2   Essential hypertension 11/18/2021   GERD  (gastroesophageal reflux disease)    no meds currently   High cholesterol    Hypertension    Mastitis    right breast   Postpartum care following cesarean delivery (2/10) 12/17/2013   Pure hypercholesterolemia 11/18/2021    Past Surgical History:  Procedure Laterality Date   CERVICAL CERCLAGE     CERVICAL CERCLAGE N/A 06/21/2013   Procedure: McDonald CERCLAGE CERVICAL;  Surgeon: Serita Kyle, MD;  Location: [facility];  Service: Gynecology;  Laterality: N/A;   CESAREAN SECTION  2008   CESAREAN SECTION N/A 12/17/2013   Procedure: Repeat CESAREAN SECTION with Cerclage Removal;  Surgeon: Serita Kyle, MD;  Location: [facility];  Service: Obstetrics;  Laterality: N/A;  EDD: 12/22/13   CHOLECYSTECTOMY     CHOLECYSTECTOMY OPEN  08/08/2011   GANGLION CYST EXCISION Right 1997   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    Family History  Problem Relation Age of Onset   Hypertension Mother    Hypercholesterolemia Mother    Other Sister        brain tumor   Diabetes Maternal Aunt    Hypertension Maternal Grandmother    Cancer Maternal Grandmother        breast, colon   Diabetes Maternal Grandmother    Hypertension Maternal Grandfather    Heart attack Cousin 7   Healthy Daughter    Healthy Daughter  Social History   Socioeconomic History   Marital status: Married    Spouse name: Not on file   Number of children: Not on file   Years of education: Not on file   Highest education level: Not on file  Occupational History   Not on file  Tobacco Use   Smoking status: Never    Passive exposure: Never   Smokeless tobacco: Never  Vaping Use   Vaping Use: Never used  Substance and Sexual Activity   Alcohol use: Yes    Comment: occasionally   Drug use: No   Sexual activity: Not on file  Other Topics Concern   Not on file  Social History Narrative   Not on file   Social Determinants of Health   Financial Resource Strain: Low Risk  (11/18/2021)    Overall Financial Resource Strain (CARDIA)    Difficulty of Paying Living Expenses: Not hard at all  Food Insecurity: No Food Insecurity (11/18/2021)   Hunger Vital Sign    Worried About Running Out of Food in the Last Year: Never true    Ran Out of Food in the Last Year: Never true  Transportation Needs: No Transportation Needs (11/18/2021)   PRAPARE - Administrator, Civil Service (Medical): No    Lack of Transportation (Non-Medical): No  Physical Activity: Inactive (11/18/2021)   Exercise Vital Sign    Days of Exercise per Week: 0 days    Minutes of Exercise per Session: 0 min  Stress: Not on file  Social Connections: Not on file  Intimate Partner Violence: Not on file    Outpatient Medications Prior to Visit  Medication Sig Dispense Refill   acetaminophen (TYLENOL) 500 MG tablet Take 1,000 mg by mouth every 6 (six) hours as needed for moderate pain or headache.     albuterol (VENTOLIN HFA) 108 (90 Base) MCG/ACT inhaler Inhale 1-2 puffs into the lungs every 6 (six) hours as needed for wheezing or shortness of breath. 18 g 5   aspirin EC 81 MG tablet Take 1 tablet (81 mg total) by mouth daily. Swallow whole. 90 tablet 3   Bacillus Coagulans-Inulin (PROBIOTIC) 1-250 BILLION-MG CAPS Take 1 capsule by mouth daily.     Continuous Blood Gluc Sensor (DEXCOM G7 SENSOR) MISC Change sensor every 10 days 9 each 4   cyanocobalamin (VITAMIN B12) 1000 MCG/ML injection Inject 1 mL (1,000 mcg total) into the skin daily for 7 days, THEN 1 mL (1,000 mcg total) once a week for 30 days, THEN 1 mL (1,000 mcg total) every 14 (fourteen) days. 10 mL 6   dapagliflozin propanediol (FARXIGA) 10 MG TABS tablet Take 1 tablet (10 mg total) by mouth daily. 30 tablet 5   dicyclomine (BENTYL) 20 MG tablet Take 1 tablet (20 mg total) by mouth 2 (two) times daily as needed for abdominal cramping. 20 tablet 0   escitalopram (LEXAPRO) 20 MG tablet TAKE 2 TABLETS BY MOUTH ONCE A DAY. 180 tablet 3   hyoscyamine  (LEVBID) 0.375 MG 12 hr tablet Take 1 tablet  by mouth as needed as directed (30 days). 30 tablet 1   Insulin Disposable Pump (OMNIPOD 5 G6 INTRO, GEN 5,) KIT Change pod every 2 days as directed (Total daily dose of 100 units) 1 kit 0   Insulin Disposable Pump (OMNIPOD 5 G6 PODS, GEN 5,) MISC Change pod every 2 days as directed (TDD of 100 units) 30 each 4   Insulin Pen Needle 32G X 4 MM MISC  by Does not apply route. BD U/F Pen Needles Nano 4 mm x 32 G; use with insulin     insulin regular human CONCENTRATED (HUMULIN R U-500 KWIKPEN) 500 UNIT/ML KwikPen Inject 5-50 Units into the skin 3 (three) times daily with meals. Take 10 units in the morning, 45 units at lunch, 25 units at dinner. (Patient taking differently: Inject 5-50 Units into the skin 3 (three) times daily with meals. Take 25 units in the morning, 50 units at lunch, 25 units at dinner.) 24 mL 3   insulin regular human CONCENTRATED (HUMULIN R U-500 KWIKPEN) 500 UNIT/ML KwikPen Inject 10 units into the skin at breakfast, 40 units at lunch, 20 units at evening meal 24 mL 3   LORazepam (ATIVAN) 1 MG tablet Take 1/2 tablet by mouth once daily as needed 20 tablet 1   losartan (COZAAR) 50 MG tablet Take 1 tablet (50 mg total) by mouth daily. 90 tablet 1   meclizine (ANTIVERT) 12.5 MG tablet Take 1 tablet (12.5 mg total) by mouth 3 (three) times daily as needed for dizziness. 30 tablet 1   medroxyPROGESTERone Acetate 150 MG/ML SUSY Inject 1 syringe into the muscle every 11-12 weeks 1 mL 11   metoprolol succinate (TOPROL-XL) 25 MG 24 hr tablet Take 1.5 tablets (37.5 mg total) by mouth 2 (two) times daily. 180 tablet 3   nystatin-triamcinolone (MYCOLOG II) cream APPLY TO THE AFFECTED AREA(S) TWO TIMES DAILY AS DIRECTED (Patient taking differently: Apply 1 application  topically 2 (two) times daily as needed (rash).) 20 g 2   promethazine (PHENERGAN) 25 MG tablet TAKE 1 TABLET BY MOUTH EVERY 8 HOURS (Patient taking differently: Take 25 mg by mouth every  8 (eight) hours as needed for vomiting or nausea.) 90 tablet 1   rizatriptan (MAXALT) 10 MG tablet Take 1 tablet (10 mg total) by mouth once as needed for up to 1 dose for migraine (Migraine headaches). May repeat in 2 hours if needed 12 tablet 3   spironolactone (ALDACTONE) 50 MG tablet Take 0.5 tablets (25 mg total) by mouth daily. 30 tablet 11   SYRINGE-NEEDLE, DISP, 3 ML (B-D 3CC LUER-LOK SYR 25GX5/8") 25G X 5/8" 3 ML MISC Use to inject B12 under the skin 50 each 3   tirzepatide (MOUNJARO) 5 MG/0.5ML Pen Inject 5 mg into the skin once a week. 2 mL 0   ustekinumab (STELARA) 130 MG/26ML SOLN injection Infuse 3 vials IV over at least 1 hour 78 mL 0   insulin glargine (LANTUS SOLOSTAR) 100 UNIT/ML Solostar Pen Inject 20 Units into the skin once for 1 dose. 0.2 mL 0   fluconazole (DIFLUCAN) 150 MG tablet Take 1 tablet (150 mg total) by mouth every other day. (Patient not taking: Reported on 03/17/2023) 2 tablet 0   No facility-administered medications prior to visit.    Allergies  Allergen Reactions   Metronidazole Swelling    Throat swelling   Shellfish Allergy Swelling    Swelling of the throat   Empagliflozin-Linagliptin Other (See Comments)   Lisinopril Other (See Comments)   Metformin Hcl Other (See Comments)   Prednisone Other (See Comments)   Dulaglutide Rash   Liraglutide Rash   Nurtec [Rimegepant Sulfate] Rash    Review of Systems  Constitutional:  Positive for malaise/fatigue. Negative for chills and fever.  HENT:  Positive for ear pain. Negative for congestion, sinus pain and sore throat.   Eyes:  Positive for pain. Negative for blurred vision, double vision and photophobia.  Respiratory:  Negative for shortness of breath.   Cardiovascular:  Negative for chest pain, palpitations and leg swelling.  Gastrointestinal:  Negative for abdominal pain, constipation, diarrhea, nausea and vomiting.  Genitourinary:  Negative for dysuria, frequency and urgency.  Musculoskeletal:   Positive for neck pain.  Skin:  Negative for rash.  Neurological:  Positive for headaches. Negative for dizziness, tingling, tremors, sensory change, focal weakness and weakness.       Objective:    Physical Exam Constitutional:      General: She is not in acute distress.    Appearance: She is not ill-appearing.  HENT:     Head: Atraumatic.     Mouth/Throat:     Mouth: Mucous membranes are moist.  Eyes:     General: No visual field deficit.    Extraocular Movements: Extraocular movements intact.     Conjunctiva/sclera: Conjunctivae normal.     Pupils: Pupils are equal, round, and reactive to light.  Cardiovascular:     Rate and Rhythm: Normal rate and regular rhythm.  Pulmonary:     Effort: Pulmonary effort is normal.     Breath sounds: Normal breath sounds.  Musculoskeletal:     Cervical back: Normal range of motion and neck supple. No rigidity.  Lymphadenopathy:     Cervical: No cervical adenopathy.  Skin:    General: Skin is warm and dry.  Neurological:     General: No focal deficit present.     Mental Status: She is alert and oriented to person, place, and time.     Cranial Nerves: No cranial nerve deficit, dysarthria or facial asymmetry.     Sensory: No sensory deficit.     Motor: No weakness.     Coordination: Coordination normal.     Gait: Gait normal.  Psychiatric:        Mood and Affect: Mood normal.        Speech: Speech normal.        Behavior: Behavior normal.        Thought Content: Thought content normal.     BP 116/74 (BP Location: Left Arm, Patient Position: Sitting, Cuff Size: Large)   Pulse 95   Temp 97.6 F (36.4 C) (Temporal)   Ht 5\' 6"  (1.676 m)   Wt 157 lb (71.2 kg)   SpO2 99%   Breastfeeding No   BMI 25.34 kg/m  Wt Readings from Last 3 Encounters:  03/17/23 157 lb (71.2 kg)  02/20/23 155 lb (70.3 kg)  01/13/23 165 lb (74.8 kg)       Assessment & Plan:   Problem List Items Addressed This Visit   None Visit Diagnoses      Other migraine without status migrainosus, not intractable    -  Primary   Neck pain on left side          Reviewed MRI results and previous providers notes who have seen her for the same.  Overall, she feels much better today than 2 days ago when she scheduled the visit.  She was not aware that she can take a 2nd Maxalt tablet 2 hours after the first.   States Nurtec caused a rash.   Appt with neurologist next month.   I have discontinued Zierra N. Benko's fluconazole. I am also having her maintain her Insulin Pen Needle, Probiotic, nystatin-triamcinolone, hyoscyamine, LORazepam, promethazine, escitalopram, aspirin EC, acetaminophen, HumuLIN R U-500 KwikPen, ustekinumab, Dexcom G7 Sensor, medroxyPROGESTERone Acetate, HumuLIN R U-500 KwikPen, dicyclomine, dapagliflozin propanediol, losartan, meclizine, Omnipod 5 G6 Intro (  Gen 5), Omnipod 5 G6 Pods (Gen 5), albuterol, cyanocobalamin, B-D 3CC LUER-LOK SYR 25GX5/8", rizatriptan, spironolactone, metoprolol succinate, Mounjaro, and Lantus SoloStar.  No orders of the defined types were placed in this encounter.

## 2023-03-17 NOTE — Patient Instructions (Addendum)
If your headache gets worse, you can take the Maxalt as discussed.   Your neck pain appears to be related to a musculoskeletal issue. Ok to keep using the heat since that helped.   Stay hydrated.   See if you can get an eye exam sooner than you are now scheduled.   See the neurologist as scheduled.

## 2023-03-21 ENCOUNTER — Other Ambulatory Visit (HOSPITAL_COMMUNITY): Payer: Self-pay

## 2023-03-21 ENCOUNTER — Other Ambulatory Visit: Payer: Self-pay

## 2023-03-23 ENCOUNTER — Other Ambulatory Visit (HOSPITAL_COMMUNITY): Payer: Self-pay

## 2023-03-24 ENCOUNTER — Ambulatory Visit: Payer: Self-pay | Admitting: Rheumatology

## 2023-03-24 DIAGNOSIS — G8929 Other chronic pain: Secondary | ICD-10-CM

## 2023-03-24 DIAGNOSIS — F5102 Adjustment insomnia: Secondary | ICD-10-CM

## 2023-03-24 DIAGNOSIS — M79641 Pain in right hand: Secondary | ICD-10-CM

## 2023-03-24 DIAGNOSIS — I471 Supraventricular tachycardia, unspecified: Secondary | ICD-10-CM

## 2023-03-24 DIAGNOSIS — E538 Deficiency of other specified B group vitamins: Secondary | ICD-10-CM

## 2023-03-24 DIAGNOSIS — Z3042 Encounter for surveillance of injectable contraceptive: Secondary | ICD-10-CM | POA: Diagnosis not present

## 2023-03-24 DIAGNOSIS — R768 Other specified abnormal immunological findings in serum: Secondary | ICD-10-CM

## 2023-03-24 DIAGNOSIS — I2510 Atherosclerotic heart disease of native coronary artery without angina pectoris: Secondary | ICD-10-CM

## 2023-03-24 DIAGNOSIS — Z803 Family history of malignant neoplasm of breast: Secondary | ICD-10-CM

## 2023-03-24 DIAGNOSIS — I10 Essential (primary) hypertension: Secondary | ICD-10-CM

## 2023-03-24 DIAGNOSIS — M2550 Pain in unspecified joint: Secondary | ICD-10-CM

## 2023-03-24 DIAGNOSIS — Z79899 Other long term (current) drug therapy: Secondary | ICD-10-CM

## 2023-03-24 DIAGNOSIS — Z8742 Personal history of other diseases of the female genital tract: Secondary | ICD-10-CM

## 2023-03-24 DIAGNOSIS — K5730 Diverticulosis of large intestine without perforation or abscess without bleeding: Secondary | ICD-10-CM

## 2023-03-24 DIAGNOSIS — E11618 Type 2 diabetes mellitus with other diabetic arthropathy: Secondary | ICD-10-CM

## 2023-03-24 DIAGNOSIS — G5601 Carpal tunnel syndrome, right upper limb: Secondary | ICD-10-CM

## 2023-03-24 DIAGNOSIS — E559 Vitamin D deficiency, unspecified: Secondary | ICD-10-CM

## 2023-03-24 DIAGNOSIS — J4521 Mild intermittent asthma with (acute) exacerbation: Secondary | ICD-10-CM

## 2023-03-24 DIAGNOSIS — M79671 Pain in right foot: Secondary | ICD-10-CM

## 2023-03-24 DIAGNOSIS — Z8719 Personal history of other diseases of the digestive system: Secondary | ICD-10-CM

## 2023-03-24 DIAGNOSIS — K50818 Crohn's disease of both small and large intestine with other complication: Secondary | ICD-10-CM

## 2023-03-24 DIAGNOSIS — E7800 Pure hypercholesterolemia, unspecified: Secondary | ICD-10-CM

## 2023-03-24 NOTE — Addendum Note (Signed)
Addended by: Karma Ganja on: 03/24/2023 03:57 PM   Modules accepted: Orders

## 2023-04-04 ENCOUNTER — Other Ambulatory Visit (HOSPITAL_COMMUNITY): Payer: Self-pay

## 2023-04-04 MED ORDER — NA SULFATE-K SULFATE-MG SULF 17.5-3.13-1.6 GM/177ML PO SOLN
ORAL | 0 refills | Status: DC
  Filled 2023-04-04: qty 354, 1d supply, fill #0

## 2023-04-06 ENCOUNTER — Ambulatory Visit (INDEPENDENT_AMBULATORY_CARE_PROVIDER_SITE_OTHER): Payer: Commercial Managed Care - PPO | Admitting: Internal Medicine

## 2023-04-06 VITALS — BP 150/80 | HR 90 | Temp 98.1°F | Ht 66.0 in

## 2023-04-06 DIAGNOSIS — L299 Pruritus, unspecified: Secondary | ICD-10-CM

## 2023-04-06 DIAGNOSIS — T783XXA Angioneurotic edema, initial encounter: Secondary | ICD-10-CM

## 2023-04-06 MED ORDER — METHYLPREDNISOLONE ACETATE 80 MG/ML IJ SUSP
80.0000 mg | Freq: Once | INTRAMUSCULAR | Status: AC
Start: 2023-04-06 — End: 2023-04-06
  Administered 2023-04-06: 80 mg via INTRAMUSCULAR

## 2023-04-06 MED ORDER — EPINEPHRINE 0.3 MG/0.3ML IJ SOAJ
0.3000 mg | Freq: Once | INTRAMUSCULAR | Status: AC
Start: 2023-04-06 — End: 2023-04-06
  Administered 2023-04-06: 0.3 mg via SUBCUTANEOUS

## 2023-04-06 NOTE — Progress Notes (Unsigned)
Subjective:  Patient ID: Cindy Long, female    DOB: [birth year]  Age: 47 y.o. MRN: [id number]  CC: Rash   HPI Cindy Long presents for a rash...   She complains of the acute onset of an itchy, red, whelps over her extremities today while she was at work.  She denies swelling around her mouth, face, lips, throat, or tongue.  She denies shortness of breath, cough, or wheezing.   Outpatient Medications Prior to Visit  Medication Sig Dispense Refill   acetaminophen (TYLENOL) 500 MG tablet Take 1,000 mg by mouth every 6 (six) hours as needed for moderate pain or headache.     albuterol (VENTOLIN HFA) 108 (90 Base) MCG/ACT inhaler Inhale 1-2 puffs into the lungs every 6 (six) hours as needed for wheezing or shortness of breath. 18 g 5   aspirin EC 81 MG tablet Take 1 tablet (81 mg total) by mouth daily. Swallow whole. 90 tablet 3   Bacillus Coagulans-Inulin (PROBIOTIC) 1-250 BILLION-MG CAPS Take 1 capsule by mouth daily.     Continuous Glucose Sensor (DEXCOM G7 SENSOR) MISC Change sensor every 10 days 9 each 4   cyanocobalamin (VITAMIN B12) 1000 MCG/ML injection Inject 1 mL (1,000 mcg total) into the skin daily for 7 days, THEN 1 mL (1,000 mcg total) once a week for 30 days, THEN 1 mL (1,000 mcg total) every 14 (fourteen) days. 10 mL 6   dapagliflozin propanediol (FARXIGA) 10 MG TABS tablet Take 1 tablet (10 mg total) by mouth daily. 30 tablet 5   dicyclomine (BENTYL) 20 MG tablet Take 1 tablet (20 mg total) by mouth 2 (two) times daily as needed for abdominal cramping. 20 tablet 0   escitalopram (LEXAPRO) 20 MG tablet TAKE 2 TABLETS BY MOUTH ONCE A DAY. 180 tablet 3   hyoscyamine (LEVBID) 0.375 MG 12 hr tablet Take 1 tablet  by mouth as needed as directed (30 days). 30 tablet 1   Insulin Disposable Pump (OMNIPOD 5 G6 INTRO, GEN 5,) KIT Change pod every 2 days as directed (Total daily dose of 100 units) 1 kit 0   Insulin Disposable Pump (OMNIPOD 5 G6 PODS, GEN 5,) MISC  Change pod every 2 days as directed (TDD of 100 units) 30 each 4   insulin lispro (HUMALOG) 100 UNIT/ML injection Up to 200 units per day/via insulin pump Injection 30 days 60 mL 11   Insulin Pen Needle 32G X 4 MM MISC by Does not apply route. BD U/F Pen Needles Nano 4 mm x 32 G; use with insulin     insulin regular human CONCENTRATED (HUMULIN R U-500 KWIKPEN) 500 UNIT/ML KwikPen Inject 5-50 Units into the skin 3 (three) times daily with meals. Take 10 units in the morning, 45 units at lunch, 25 units at dinner. (Patient taking differently: Inject 5-50 Units into the skin 3 (three) times daily with meals. Take 25 units in the morning, 50 units at lunch, 25 units at dinner.) 24 mL 3   insulin regular human CONCENTRATED (HUMULIN R U-500 KWIKPEN) 500 UNIT/ML KwikPen Inject 10 units into the skin at breakfast, 40 units at lunch, 20 units at evening meal 24 mL 3   LORazepam (ATIVAN) 1 MG tablet Take 1/2 tablet by mouth once daily as needed 20 tablet 1   losartan (COZAAR) 50 MG tablet Take 1 tablet (50 mg total) by mouth daily. 90 tablet 1   meclizine (ANTIVERT) 12.5 MG tablet Take 1 tablet (12.5 mg  total) by mouth 3 (three) times daily as needed for dizziness. 30 tablet 1   medroxyPROGESTERone Acetate 150 MG/ML SUSY Inject 1 syringe into the muscle every 11-12 weeks 1 mL 11   metoprolol succinate (TOPROL-XL) 25 MG 24 hr tablet Take 1.5 tablets (37.5 mg total) by mouth 2 (two) times daily. 180 tablet 3   Na Sulfate-K Sulfate-Mg Sulf 17.5-3.13-1.6 GM/177ML SOLN Use as directed 354 mL 0   nystatin-triamcinolone (MYCOLOG II) cream APPLY TO THE AFFECTED AREA(S) TWO TIMES DAILY AS DIRECTED (Patient taking differently: Apply 1 application  topically 2 (two) times daily as needed (rash).) 20 g 2   promethazine (PHENERGAN) 25 MG tablet TAKE 1 TABLET BY MOUTH EVERY 8 HOURS (Patient taking differently: Take 25 mg by mouth every 8 (eight) hours as needed for vomiting or nausea.) 90 tablet 1   rizatriptan (MAXALT) 10 MG  tablet Take 1 tablet (10 mg total) by mouth once as needed for up to 1 dose for migraine (Migraine headaches). May repeat in 2 hours if needed 12 tablet 3   spironolactone (ALDACTONE) 50 MG tablet Take 0.5 tablets (25 mg total) by mouth daily. 30 tablet 11   SYRINGE-NEEDLE, DISP, 3 ML (B-D 3CC LUER-LOK SYR 25GX5/8") 25G X 5/8" 3 ML MISC Use to inject B12 under the skin 50 each 3   tirzepatide (MOUNJARO) 5 MG/0.5ML Pen Inject 5 mg into the skin once a week. 2 mL 0   ustekinumab (STELARA) 130 MG/26ML SOLN injection Infuse 3 vials IV over at least 1 hour 78 mL 0   insulin glargine (LANTUS SOLOSTAR) 100 UNIT/ML Solostar Pen Inject 20 Units into the skin once for 1 dose. 0.2 mL 0   No facility-administered medications prior to visit.    ROS Review of Systems  Constitutional: Negative.  Negative for diaphoresis.  HENT: Negative.  Negative for facial swelling and trouble swallowing.   Respiratory:  Negative for cough, choking, shortness of breath and wheezing.   Cardiovascular:  Negative for chest pain, palpitations and leg swelling.  Gastrointestinal:  Negative for abdominal pain.  Musculoskeletal: Negative.   Skin:  Positive for color change and rash.    Objective:  BP (!) 150/80 (BP Location: Right Arm, Patient Position: Sitting, Cuff Size: Normal)   Pulse 90   Temp 98.1 F (36.7 C) (Oral)   Ht 5\' 6"  (1.676 m)   SpO2 97%   BMI 25.34 kg/m   BP Readings from Last 3 Encounters:  04/06/23 (!) 150/80  03/17/23 116/74  03/10/23 121/69    Wt Readings from Last 3 Encounters:  03/17/23 157 lb (71.2 kg)  02/20/23 155 lb (70.3 kg)  01/13/23 165 lb (74.8 kg)    Physical Exam Skin:    Findings: Erythema and rash present. Rash is urticarial. Rash is not macular, papular, purpuric, scaling or vesicular.     Lab Results  Component Value Date   WBC 9.0 02/20/2023   HGB 16.9 (H) 02/20/2023   HCT 50.5 (H) 02/20/2023   PLT 278.0 02/20/2023   GLUCOSE 229 (H) 02/20/2023   CHOL 116  03/05/2022   TRIG 71 03/05/2022   HDL 34 (L) 03/05/2022   LDLCALC 68 03/05/2022   ALT 34 02/20/2023   AST 28 02/20/2023   NA 136 02/20/2023   K 4.3 02/20/2023   CL 102 02/20/2023   CREATININE 0.76 02/20/2023   BUN 8 02/20/2023   CO2 22 02/20/2023   TSH 0.89 08/11/2022   HGBA1C 11.0 (H) 02/20/2023    No results  found.  Assessment & Plan:  Angioedema, initial encounter  Severe itching -     methylPREDNISolone Acetate -     EPINEPHrine     Follow-up: No follow-ups on file.  Sanda Linger, MD

## 2023-04-07 ENCOUNTER — Telehealth: Payer: Self-pay

## 2023-04-07 DIAGNOSIS — K5010 Crohn's disease of large intestine without complications: Secondary | ICD-10-CM | POA: Diagnosis not present

## 2023-04-07 DIAGNOSIS — D122 Benign neoplasm of ascending colon: Secondary | ICD-10-CM | POA: Diagnosis not present

## 2023-04-07 DIAGNOSIS — K5730 Diverticulosis of large intestine without perforation or abscess without bleeding: Secondary | ICD-10-CM | POA: Diagnosis not present

## 2023-04-07 DIAGNOSIS — K648 Other hemorrhoids: Secondary | ICD-10-CM | POA: Diagnosis not present

## 2023-04-07 LAB — HM COLONOSCOPY

## 2023-04-07 NOTE — Telephone Encounter (Signed)
Called patient to follow up from yesterdays allergic reaction. Patient stable no complaints at this time related to event

## 2023-04-08 DIAGNOSIS — Z0100 Encounter for examination of eyes and vision without abnormal findings: Secondary | ICD-10-CM | POA: Diagnosis not present

## 2023-04-08 LAB — HM DIABETES EYE EXAM

## 2023-04-09 ENCOUNTER — Encounter: Payer: Self-pay | Admitting: Internal Medicine

## 2023-04-11 ENCOUNTER — Encounter: Payer: Self-pay | Admitting: Internal Medicine

## 2023-04-13 ENCOUNTER — Encounter: Payer: Self-pay | Admitting: Internal Medicine

## 2023-04-13 ENCOUNTER — Other Ambulatory Visit (HOSPITAL_COMMUNITY): Payer: Self-pay

## 2023-04-15 ENCOUNTER — Other Ambulatory Visit (HOSPITAL_COMMUNITY): Payer: Self-pay

## 2023-04-15 MED ORDER — MOUNJARO 5 MG/0.5ML ~~LOC~~ SOAJ
5.0000 mg | SUBCUTANEOUS | 12 refills | Status: DC
  Filled 2023-04-15: qty 2, 28d supply, fill #0
  Filled 2023-05-10: qty 2, 28d supply, fill #1
  Filled 2023-06-23 – 2023-06-26 (×2): qty 2, 28d supply, fill #2
  Filled 2023-08-04: qty 2, 28d supply, fill #3
  Filled 2023-09-17: qty 2, 28d supply, fill #4
  Filled 2024-02-06 – 2024-02-09 (×2): qty 2, 28d supply, fill #5
  Filled 2024-03-21: qty 2, 28d supply, fill #6

## 2023-04-17 ENCOUNTER — Other Ambulatory Visit: Payer: Self-pay

## 2023-04-19 ENCOUNTER — Other Ambulatory Visit (HOSPITAL_COMMUNITY): Payer: Self-pay

## 2023-04-21 ENCOUNTER — Other Ambulatory Visit: Payer: Self-pay

## 2023-04-21 ENCOUNTER — Encounter (HOSPITAL_BASED_OUTPATIENT_CLINIC_OR_DEPARTMENT_OTHER): Payer: Self-pay | Admitting: Family

## 2023-04-21 ENCOUNTER — Ambulatory Visit (HOSPITAL_BASED_OUTPATIENT_CLINIC_OR_DEPARTMENT_OTHER): Payer: Commercial Managed Care - PPO | Admitting: Family

## 2023-04-21 VITALS — BP 116/76 | HR 91 | Ht 66.0 in | Wt 163.2 lb

## 2023-04-21 DIAGNOSIS — E1165 Type 2 diabetes mellitus with hyperglycemia: Secondary | ICD-10-CM

## 2023-04-21 DIAGNOSIS — I10 Essential (primary) hypertension: Secondary | ICD-10-CM

## 2023-04-21 DIAGNOSIS — I25118 Atherosclerotic heart disease of native coronary artery with other forms of angina pectoris: Secondary | ICD-10-CM | POA: Diagnosis not present

## 2023-04-21 DIAGNOSIS — Z794 Long term (current) use of insulin: Secondary | ICD-10-CM | POA: Diagnosis not present

## 2023-04-21 DIAGNOSIS — E785 Hyperlipidemia, unspecified: Secondary | ICD-10-CM | POA: Diagnosis not present

## 2023-04-21 DIAGNOSIS — R002 Palpitations: Secondary | ICD-10-CM | POA: Diagnosis not present

## 2023-04-21 MED ORDER — METOPROLOL SUCCINATE ER 25 MG PO TB24
25.0000 mg | ORAL_TABLET | Freq: Every day | ORAL | 3 refills | Status: DC
  Filled 2023-04-21: qty 135, 90d supply, fill #0
  Filled 2023-10-08: qty 135, 90d supply, fill #1
  Filled 2024-02-15: qty 135, 90d supply, fill #2
  Filled 2024-02-17: qty 30, 30d supply, fill #2

## 2023-04-21 MED ORDER — SPIRONOLACTONE 25 MG PO TABS: 12.5000 mg | ORAL_TABLET | Freq: Every day | ORAL | 2 refills | Status: DC

## 2023-04-21 MED ORDER — ATORVASTATIN CALCIUM 20 MG PO TABS
20.0000 mg | ORAL_TABLET | Freq: Every day | ORAL | 3 refills | Status: DC
Start: 2023-04-21 — End: 2024-05-27
  Filled 2023-04-21: qty 90, 90d supply, fill #0
  Filled 2023-10-08: qty 90, 90d supply, fill #1
  Filled 2024-02-15 – 2024-02-17 (×2): qty 90, 90d supply, fill #2

## 2023-04-21 NOTE — Patient Instructions (Signed)
Medication Instructions:  Your physician has recommended you make the following change in your medication:   REDUCE Spironolactone to 12.5mg  (half of the 25mg  tablet) once per day  CONTINUE Metoprolol 25mg  daily - may take additional half tablet as needed for breakthrough palpitations, tachycardia    *If you need a refill on your cardiac medications before your next appointment, please call your pharmacy*   Lab Work: Your physician recommends that you return for lab work in the next few weeks for fasting lipid panel  We will let you know about having labs done at primary care.   If you have labs (blood work) drawn today and your tests are completely normal, you will receive your results only by: MyChart Message (if you have MyChart) OR A paper copy in the mail If you have any lab test that is abnormal or we need to change your treatment, we will call you to review the results.  Follow-Up: At [facility], you and your health needs are our priority.  As part of our continuing mission to provide you with exceptional heart care, we have created designated Provider Care Teams.  These Care Teams include your primary Cardiologist (physician) and Advanced Practice Providers (APPs -  Physician Assistants and Nurse Practitioners) who all work together to provide you with the care you need, when you need it.  We recommend signing up for the patient portal called "MyChart".  Sign up information is provided on this After Visit Summary.  MyChart is used to connect with patients for Virtual Visits (Telemedicine).  Patients are able to view lab/test results, encounter notes, upcoming appointments, etc.  Non-urgent messages can be sent to your provider as well.   To learn more about what you can do with MyChart, go to [email or web address].    Your next appointment:   1 year  Provider:   Chilton Si, MD or Alver Sorrow, NP    Other Instructions

## 2023-04-21 NOTE — Progress Notes (Unsigned)
Cardiology Office Note:  .   Date:  04/22/2023  ID:  Cindy Long, DOB 11-26-1975, MRN [id number] PCP: Cindy Garter, MD  [facility] Providers Cardiologist:  Chilton Si, MD    History of Present Illness: .   Cindy Long is a 47 y.o. female history of hypertension, hyperlipidemia, diabetes, GERD, Crohn's disease.  Last seen 03/17/2022 by Dr. Cristela Long.  Prior echo 08/2018 LVEF 55%, normal diastolic function.  Exercise Myoview 06/2018 achieving 7 METS with no ischemia.  Visual prolonged Dr. Gwynneth Long.  Established with Dr. Duke Long 11/2021.  Coronary CTA 11/2021 minimal plaque in the LAD in the 92nd percentile score.  Monitor revealed rare PAC/PVC.  Echocardiogram 02/2022 normal wall motion and LVEF.  Chest pain during ED visit at that time Long to be related to possible spasm or myopericarditis.  Presents today for follow-up independently.  Pleasant lady who works at [employer] primary care performing Medicare wellness visits.  Interested in a time with her family including her husband, 14 year old, 81-year-old children.  She had 1 episode of SVT while at work in April at which time she was recommended to increase metoprolol to 37.5 mg and reduce spironolactone to 25 mg.  She has since returned to [place] 25 mg without recurrent palpitations. BP at home usually 110s/60s.  No associated lightheadedness, dizziness.  Reports no shortness of breath nor dyspnea on exertion. Reports no chest pain, pressure, or tightness. No edema, orthopnea, PND.   ROS: Please see the history of present illness.    All other systems reviewed and are negative.   Studies Reviewed: [name]    EKG:  EKG is not ordered today.    Risk Assessment/Calculations:             Physical Exam:   VS:  BP 116/76   Pulse 91   Ht 5\' 6"  (1.676 m)   Wt 163 lb 3.2 oz (74 kg)   SpO2 97%   BMI 26.34 kg/m    Wt Readings from Last 3 Encounters:  04/21/23 163 lb 3.2 oz (74 kg)  03/17/23 157 lb (71.2 kg)   02/20/23 155 lb (70.3 kg)    GEN: Well nourished, well developed in no acute distress NECK: No JVD; No carotid bruits CARDIAC: RRR, no murmurs, rubs, gallops RESPIRATORY:  Clear to auscultation without rales, wheezing or rhonchi  ABDOMEN: Soft, non-tender, non-distended EXTREMITIES:  No edema; No deformity   ASSESSMENT AND PLAN: .    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 No indication for ischemic evaluation.  GDMT includes aspirin, atorvastatin, metoprolol. Heart healthy diet and regular cardiovascular exercise encouraged.  Given information on right start exercise program.  DM2 - 02/2023 A1c 11. Continue to follow with PCP. Appreciate inclusion of Farxiga for cardioprotective benefit.   HTN -relatively hypotensive but asymptomatic with no lightheadedness dizziness.  Ideally would consolidate medication regimen.  Will reduce spironolactone from 25 to 12.5 mg daily.  Check in via MyChart in 2 weeks to see if we can discontinue spironolactone.  Continue losartan 50 mg daily   HLD, LDL goal <70 -continue atorvastatin 20 mg daily.  She is not fasting today so we will order lipid panel for future date.  Will reach out to PCP to see if can collect at their office as she works there and will be more convenient.  Palpitations / PSVT -reports brief episodes of PSVT in April with no recurrence.  Encouraged to continue to avoid caffeine, stay well-hydrated, manage stress well.  Continue Toprol 25 mg daily May take additional half tablet as needed for breakthrough palpitations.       Dispo: follow up in 1 year with Dr. Duke Long  Signed, Alver Sorrow, NP

## 2023-04-22 ENCOUNTER — Encounter (HOSPITAL_BASED_OUTPATIENT_CLINIC_OR_DEPARTMENT_OTHER): Payer: Self-pay | Admitting: Family

## 2023-04-25 ENCOUNTER — Other Ambulatory Visit: Payer: Self-pay | Admitting: Pharmacy Technician

## 2023-04-28 ENCOUNTER — Ambulatory Visit: Payer: Commercial Managed Care - PPO | Admitting: Neurology

## 2023-04-28 ENCOUNTER — Encounter: Payer: Self-pay | Admitting: Neurology

## 2023-04-28 ENCOUNTER — Other Ambulatory Visit (HOSPITAL_COMMUNITY): Payer: Self-pay

## 2023-04-28 VITALS — BP 101/63 | HR 91 | Ht 66.0 in | Wt 161.0 lb

## 2023-04-28 DIAGNOSIS — G43009 Migraine without aura, not intractable, without status migrainosus: Secondary | ICD-10-CM

## 2023-04-28 MED ORDER — GABAPENTIN 300 MG PO CAPS
300.0000 mg | ORAL_CAPSULE | Freq: Every day | ORAL | 1 refills | Status: DC
  Filled 2023-04-28: qty 90, 90d supply, fill #0
  Filled 2023-08-04: qty 90, 90d supply, fill #1

## 2023-04-28 MED ORDER — RIZATRIPTAN BENZOATE 10 MG PO TABS
10.0000 mg | ORAL_TABLET | Freq: Once | ORAL | 6 refills | Status: DC | PRN
  Filled 2023-04-28: qty 18, 60d supply, fill #0
  Filled 2023-08-04: qty 18, 60d supply, fill #1

## 2023-04-28 NOTE — Patient Instructions (Signed)
Continue current medications Start with gabapentin 300 mg nightly Continue with Maxalt as needed for headaches Please contact me if your headache frequency is getting worse Follow-up in 6 months or sooner if worse.

## 2023-04-28 NOTE — Progress Notes (Signed)
[facility]  PATIENT: Cindy Long DOB: 02/12/1976  REQUESTING CLINICIAN: Pincus Sanes, MD HISTORY FROM: Patient  REASON FOR VISIT: Headaches    HISTORICAL  CHIEF COMPLAINT:  Chief Complaint  Patient presents with   New Patient (Initial Visit)    Pt in 47  Pt here for headaches Pt states 10+ headaches in last month  Pt states headache this am Pain level 2 Pr states headache pain starts in left temporal travels to back of head Pt states some neck stiffness     HISTORY OF PRESENT ILLNESS:  This is a 47 year old woman past medical history of hypertension, hyperlipidemia, diabetes mellitus, history of migraine since 2018 who is presenting to establish care.  Patient reports with her migraines, she used to take Maxalt with improvement of her symptoms, but lately her frequency has increased.  Currently she is getting about 10 plus headaches days per month.  She is not on any preventive medication.  She describes her headache as left-sided, starts at the left temple, radiates to the back, on the left side; sometimes there is left-sided neck pain.  Sometimes with the headache she does have word finding difficulty.  There are also migrainous features including photophobia, phonophobia and nausea. She denies focal neurological deficits with her migraines.    Headache History and Characteristics: Onset: 2018 Location: Start on the left, then move to the back  Quality:  Pressure  Intensity:8/10.  Duration:Lasting all day Migrainous Features: Photophobia, phonophobia, nausea Aura: No  History of brain injury or tumor: No  Family history: Grandmother and sister  Motion sickness: no Cardiac history: no  Prior prophylaxis: Propranolol: No  Verapamil:No TCA: No Topamax: No Depakote: No Effexor: No Cymbalta: No Neurontin:No  Prior abortives: Triptan: Yes, Maxalt  Anti-emetic: No Steroids: No Ergotamine suppository: No    OTHER MEDICAL CONDITIONS:  Hypertension, Hyperlipidemia, DMII, Chron's disease   REVIEW OF SYSTEMS: Full 14 system review of systems performed and negative with exception of: As noted in the HPI   ALLERGIES: Allergies  Allergen Reactions   Metronidazole Swelling    Throat swelling   Shellfish Allergy Swelling    Swelling of the throat   Empagliflozin-Linagliptin Other (See Comments)   Lisinopril Other (See Comments)   Metformin Hcl Other (See Comments)   Prednisone Other (See Comments)   Dulaglutide Rash   Liraglutide Rash   Nurtec [Rimegepant Sulfate] Rash    HOME MEDICATIONS: Outpatient Medications Prior to Visit  Medication Sig Dispense Refill   acetaminophen (TYLENOL) 500 MG tablet Take 1,000 mg by mouth every 6 (six) hours as needed for moderate pain or headache.     albuterol (VENTOLIN HFA) 108 (90 Base) MCG/ACT inhaler Inhale 1-2 puffs into the lungs every 6 (six) hours as needed for wheezing or shortness of breath. 18 g 5   aspirin EC 81 MG tablet Take 1 tablet (81 mg total) by mouth daily. Swallow whole. 90 tablet 3   atorvastatin (LIPITOR) 20 MG tablet Take 1 tablet (20 mg total) by mouth daily. 90 tablet 3   Bacillus Coagulans-Inulin (PROBIOTIC) 1-250 BILLION-MG CAPS Take 1 capsule by mouth daily.     Continuous Glucose Sensor (DEXCOM G7 SENSOR) MISC Change sensor every 10 days 9 each 4   cyanocobalamin (VITAMIN B12) 1000 MCG/ML injection Inject 1 mL (1,000 mcg total) into the skin daily for 7 days, THEN 1 mL (1,000 mcg total) once a week for 30 days, THEN 1 mL (1,000 mcg total) every 14 (fourteen) days. 10  mL 6   dapagliflozin propanediol (FARXIGA) 10 MG TABS tablet Take 1 tablet (10 mg total) by mouth daily. 30 tablet 5   escitalopram (LEXAPRO) 20 MG tablet TAKE 2 TABLETS BY MOUTH ONCE A DAY. 180 tablet 3   hyoscyamine (LEVBID) 0.375 MG 12 hr tablet Take 1 tablet  by mouth as needed as directed (30 days). 30 tablet 1   inFLIXimab (REMICADE IV) Inject into the vein every 8 (eight) weeks. Pt. Is  unsure of dosing, but goes to have this done every 8 weeks     Insulin Disposable Pump (OMNIPOD 5 G6 PODS, GEN 5,) MISC Change pod every 2 days as directed (TDD of 100 units) 30 each 4   Insulin Pen Needle 32G X 4 MM MISC by Does not apply route. BD U/F Pen Needles Nano 4 mm x 32 G; use with insulin     insulin regular human CONCENTRATED (HUMULIN R U-500 KWIKPEN) 500 UNIT/ML KwikPen Inject 5-50 Units into the skin 3 (three) times daily with meals. Take 10 units in the morning, 45 units at lunch, 25 units at dinner. (Patient taking differently: Inject 5-50 Units into the skin 3 (three) times daily with meals. Take 25 units in the morning, 50 units at lunch, 25 units at dinner.) 24 mL 3   insulin regular human CONCENTRATED (HUMULIN R U-500 KWIKPEN) 500 UNIT/ML KwikPen Inject 10 units into the skin at breakfast, 40 units at lunch, 20 units at evening meal 24 mL 3   LORazepam (ATIVAN) 1 MG tablet Take 1/2 tablet by mouth once daily as needed 20 tablet 1   losartan (COZAAR) 50 MG tablet Take 1 tablet (50 mg total) by mouth daily. 90 tablet 1   meclizine (ANTIVERT) 12.5 MG tablet Take 1 tablet (12.5 mg total) by mouth 3 (three) times daily as needed for dizziness. 30 tablet 1   medroxyPROGESTERone Acetate 150 MG/ML SUSY Inject 1 syringe into the muscle every 11-12 weeks 1 mL 11   metoprolol succinate (TOPROL-XL) 25 MG 24 hr tablet Take 1 tablet (25 mg total) by mouth daily. May take additional half tablet as needed for tachycardia, palpitations. 135 tablet 3   spironolactone (ALDACTONE) 25 MG tablet Take 0.5 tablets (12.5 mg total) by mouth daily. 15 tablet 2   SYRINGE-NEEDLE, DISP, 3 ML (B-D 3CC LUER-LOK SYR 25GX5/8") 25G X 5/8" 3 ML MISC Use to inject B12 under the skin 50 each 3   tirzepatide (MOUNJARO) 5 MG/0.5ML Pen Inject 5 mg into the skin once a week. 2 mL 12   ustekinumab (STELARA) 130 MG/26ML SOLN injection Infuse 3 vials IV over at least 1 hour 78 mL 0   rizatriptan (MAXALT) 10 MG tablet Take 1  tablet (10 mg total) by mouth once as needed for up to 1 dose for migraine (Migraine headaches). May repeat in 2 hours if needed 12 tablet 3   dicyclomine (BENTYL) 20 MG tablet Take 1 tablet (20 mg total) by mouth 2 (two) times daily as needed for abdominal cramping. 20 tablet 0   Insulin Disposable Pump (OMNIPOD 5 G6 INTRO, GEN 5,) KIT Change pod every 2 days as directed (Total daily dose of 100 units) 1 kit 0   insulin glargine (LANTUS SOLOSTAR) 100 UNIT/ML Solostar Pen Inject 20 Units into the skin once for 1 dose. 0.2 mL 0   insulin lispro (HUMALOG) 100 UNIT/ML injection Up to 200 units per day/via insulin pump Injection 30 days 60 mL 11   Na Sulfate-K Sulfate-Mg Sulf 17.5-3.13-1.6  GM/177ML SOLN Use as directed 354 mL 0   nystatin-triamcinolone (MYCOLOG II) cream APPLY TO THE AFFECTED AREA(S) TWO TIMES DAILY AS DIRECTED 20 g 2   promethazine (PHENERGAN) 25 MG tablet TAKE 1 TABLET BY MOUTH EVERY 8 HOURS 90 tablet 1   No facility-administered medications prior to visit.    PAST MEDICAL HISTORY: Past Medical History:  Diagnosis Date   Chest pressure 11/18/2021   Crohn disease (HCC)    Diabetes mellitus    IDDM, Type 2   Essential hypertension 11/18/2021   GERD (gastroesophageal reflux disease)    no meds currently   High cholesterol    Hypertension    Mastitis    right breast   Postpartum care following cesarean delivery (2/10) 12/17/2013   Pure hypercholesterolemia 11/18/2021    PAST SURGICAL HISTORY: Past Surgical History:  Procedure Laterality Date   CERVICAL CERCLAGE     CERVICAL CERCLAGE N/A 06/21/2013   Procedure: McDonald CERCLAGE CERVICAL;  Surgeon: Serita Kyle, MD;  Location: [facility];  Service: Gynecology;  Laterality: N/A;   CESAREAN SECTION  2008   CESAREAN SECTION N/A 12/17/2013   Procedure: Repeat CESAREAN SECTION with Cerclage Removal;  Surgeon: Serita Kyle, MD;  Location: [facility];  Service: Obstetrics;  Laterality: N/A;  EDD: 12/22/13    CHOLECYSTECTOMY     CHOLECYSTECTOMY OPEN  08/08/2011   GANGLION CYST EXCISION Right 1997   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    FAMILY HISTORY: Family History  Problem Relation Age of Onset   Hypertension Mother    Hypercholesterolemia Mother    Other Sister        brain tumor   Headache Sister    Diabetes Maternal Aunt    Hypertension Maternal Grandmother    Cancer Maternal Grandmother        breast, colon   Diabetes Maternal Grandmother    Headache Maternal Grandmother    Hypertension Maternal Grandfather    Healthy Daughter    Healthy Daughter    Heart attack Cousin 59    SOCIAL HISTORY: Social History   Socioeconomic History   Marital status: Married    Spouse name: Not on file   Number of children: Not on file   Years of education: Not on file   Highest education level: Not on file  Occupational History   Not on file  Tobacco Use   Smoking status: Never    Passive exposure: Never   Smokeless tobacco: Never  Vaping Use   Vaping Use: Never used  Substance and Sexual Activity   Alcohol use: Yes    Comment: occasionally   Drug use: No   Sexual activity: Not on file  Other Topics Concern   Not on file  Social History Narrative   Not on file   Social Determinants of Health   Financial Resource Strain: Low Risk  (11/18/2021)   Overall Financial Resource Strain (CARDIA)    Difficulty of Paying Living Expenses: Not hard at all  Food Insecurity: No Food Insecurity (11/18/2021)   Hunger Vital Sign    Worried About Running Out of Food in the Last Year: Never true    Ran Out of Food in the Last Year: Never true  Transportation Needs: No Transportation Needs (11/18/2021)   PRAPARE - Administrator, Civil Service (Medical): No    Lack of Transportation (Non-Medical): No  Physical Activity: Inactive (11/18/2021)   Exercise Vital Sign    Days of Exercise per Week: 0 days  Minutes of Exercise per Session: 0 min  Stress: Not on file   Social Connections: Not on file  Intimate Partner Violence: Not on file    PHYSICAL EXAM  GENERAL EXAM/CONSTITUTIONAL: Vitals:  Vitals:   04/28/23 1106  BP: 101/63  Pulse: 91  Weight: 161 lb (73 kg)  Height: 5\' 6"  (1.676 m)   Body mass index is 25.99 kg/m. Wt Readings from Last 3 Encounters:  04/28/23 161 lb (73 kg)  04/21/23 163 lb 3.2 oz (74 kg)  03/17/23 157 lb (71.2 kg)   Patient is in no distress; well developed, nourished and groomed; neck is supple  MUSCULOSKELETAL: Gait, strength, tone, movements noted in Neurologic exam below  NEUROLOGIC: MENTAL STATUS:      No data to display         awake, alert, oriented to person, place and time recent and remote memory intact normal attention and concentration language fluent, comprehension intact, naming intact fund of knowledge appropriate  CRANIAL NERVE:  2nd - no papilledema or hemorrhages on fundoscopic exam 2nd, 3rd, 4th, 6th - pupils equal and reactive to light, visual fields full to confrontation, extraocular muscles intact, no nystagmus 5th - facial sensation symmetric 7th - facial strength symmetric 8th - hearing intact 9th - palate elevates symmetrically, uvula midline 11th - shoulder shrug symmetric 12th - tongue protrusion midline  MOTOR:  normal bulk and tone, full strength in the BUE, BLE  SENSORY:  normal and symmetric to light touch, pinprick  COORDINATION:  finger-nose-finger, fine finger movements normal  GAIT/STATION:  normal   DIAGNOSTIC DATA (LABS, IMAGING, TESTING) - I reviewed patient records, labs, notes, testing and imaging myself where available.  Lab Results  Component Value Date   WBC 9.0 02/20/2023   HGB 16.9 (H) 02/20/2023   HCT 50.5 (H) 02/20/2023   MCV 83.7 02/20/2023   PLT 278.0 02/20/2023      Component Value Date/Time   NA 136 02/20/2023 1036   NA 140 11/18/2021 1605   K 4.3 02/20/2023 1036   CL 102 02/20/2023 1036   CO2 22 02/20/2023 1036   GLUCOSE  229 (H) 02/20/2023 1036   BUN 8 02/20/2023 1036   BUN 9 11/18/2021 1605   CREATININE 0.76 02/20/2023 1036   CALCIUM 10.0 02/20/2023 1036   PROT 8.4 (H) 02/20/2023 1036   ALBUMIN 4.8 02/20/2023 1036   AST 28 02/20/2023 1036   ALT 34 02/20/2023 1036   ALKPHOS 77 02/20/2023 1036   BILITOT 1.2 02/20/2023 1036   GFRNONAA >60 12/19/2022 1208   GFRAA >60 11/12/2019 1235   Lab Results  Component Value Date   CHOL 116 03/05/2022   HDL 34 (L) 03/05/2022   LDLCALC 68 03/05/2022   TRIG 71 03/05/2022   CHOLHDL 3.4 03/05/2022   Lab Results  Component Value Date   HGBA1C 11.0 (H) 02/20/2023   Lab Results  Component Value Date   VITAMINB12 206 (L) 07/16/2021   Lab Results  Component Value Date   TSH 0.89 08/11/2022    MRI Brain 02/28/2023 1. No acute intracranial process. No specific etiology for headaches identified. 2. Air-fluid level in the right maxillary sinus, which can be seen in the setting of acute sinusitis. 3. On the post contrast-enhanced sequences there appears to be focal narrowing in the distal right M1. This may be artifactual. Consider further evaluation with an MRA for more definitive characterization in the exclude possibility of vascular stenosis.    ASSESSMENT AND PLAN  47 y.o. year old female  with hypertension, hyperlipidemia, diabetes mellitus type 2, chronic disease who is presenting with management of her migraines.  Currently she is having at least 10 days of migraine a month, she is already on antidepressant and anti-hypertensive. Plan for now is to start her on gabapentin 300 mg nightly for preventive medication, and I will continue with Maxalt.  If the gabapentin is not helpful in terms of the headache frequency, we will consider switching to a different agent.  She reported trying Nurtec in the past and having a bad reaction to it.   In terms of her recent MRI showing a possible focal narrowing in the distal right M1, patient is already on aspirin, and  statin.  Advised her to continue her current medications.  Continue follow-up PCP return in 6 months or sooner if worse.   1. Migraine without aura and without status migrainosus, not intractable     Patient Instructions  Continue current medications Start with gabapentin 300 mg nightly Continue with Maxalt as needed for headaches Please contact me if your headache frequency is getting worse Follow-up in 6 months or sooner if worse.   No orders of the defined types were placed in this encounter.   Meds ordered this encounter  Medications   rizatriptan (MAXALT) 10 MG tablet    Sig: Take 1 tablet (10 mg total) by mouth once as needed for up to 1 dose for migraine (Migraine headaches). May repeat in 2 hours if needed    Dispense:  18 tablet    Refill:  6   gabapentin (NEURONTIN) 300 MG capsule    Sig: Take 1 capsule (300 mg total) by mouth at bedtime.    Dispense:  90 capsule    Refill:  1    Return in about 6 months (around 10/28/2023).    Windell Norfolk, MD 04/28/2023, 11:47 AM  [facility] [address], [address] [place], [place] [address] [phone]

## 2023-05-03 ENCOUNTER — Other Ambulatory Visit: Payer: Self-pay

## 2023-05-03 ENCOUNTER — Encounter: Payer: Self-pay | Admitting: Internal Medicine

## 2023-05-04 ENCOUNTER — Other Ambulatory Visit: Payer: Self-pay | Admitting: Internal Medicine

## 2023-05-04 ENCOUNTER — Other Ambulatory Visit (HOSPITAL_COMMUNITY): Payer: Self-pay

## 2023-05-04 MED ORDER — FLUCONAZOLE 150 MG PO TABS
ORAL_TABLET | ORAL | 0 refills | Status: DC
  Filled 2023-05-04: qty 13, 13d supply, fill #0

## 2023-05-05 ENCOUNTER — Encounter (HOSPITAL_COMMUNITY)
Admission: RE | Admit: 2023-05-05 | Discharge: 2023-05-05 | Disposition: A | Discharge status: Home / Self Care | Payer: Commercial Managed Care - PPO | Source: Ambulatory Visit | Attending: Gastroenterology | Admitting: Gastroenterology

## 2023-05-05 DIAGNOSIS — K50818 Crohn's disease of both small and large intestine with other complication: Secondary | ICD-10-CM | POA: Insufficient documentation

## 2023-05-05 MED ORDER — SODIUM CHLORIDE 0.9 % IV SOLN
5.0000 mg/kg | INTRAVENOUS | Status: DC
  Administered 2023-05-05: 400 mg via INTRAVENOUS
  Filled 2023-05-05: qty 40

## 2023-05-10 ENCOUNTER — Other Ambulatory Visit: Payer: Self-pay

## 2023-05-12 ENCOUNTER — Telehealth: Payer: Self-pay | Admitting: Pharmacy Technician

## 2023-05-12 NOTE — Telephone Encounter (Signed)
Dr. Marca Ancona, [place] Childes note:  Patient will be scheduled as soon as possible.  Auth Submission: APPROVED Site of care: Site of care: [facility] Payer: [employer] Medication & CPT/J Code(s) submitted: Avsola (infliximab-axxq) [id number] Route of submission (phone, fax, portal):  Phone # Fax # Auth type: Buy/Bill Units/visits requested: avsola 5mg /kg [id number] Reference number: [id number] Approval from: 05/12/23 to 05/11/24   Co-pay card: approved Id: [id number] Bin: [id number] Pcn: [id number] Gr: [id number]

## 2023-05-17 ENCOUNTER — Other Ambulatory Visit (HOSPITAL_COMMUNITY): Payer: Self-pay

## 2023-05-17 ENCOUNTER — Encounter: Payer: Self-pay | Admitting: Neurology

## 2023-05-17 ENCOUNTER — Ambulatory Visit: Payer: Commercial Managed Care - PPO | Admitting: Family Medicine

## 2023-05-17 ENCOUNTER — Ambulatory Visit: Payer: Commercial Managed Care - PPO | Admitting: Neurology

## 2023-05-17 VITALS — BP 114/71 | HR 92 | Ht 66.0 in | Wt 162.0 lb

## 2023-05-17 DIAGNOSIS — G43011 Migraine without aura, intractable, with status migrainosus: Secondary | ICD-10-CM | POA: Diagnosis not present

## 2023-05-17 MED ORDER — METHYLPREDNISOLONE 4 MG PO TBPK
ORAL_TABLET | ORAL | 0 refills | Status: DC
  Filled 2023-05-17 (×2): qty 21, 6d supply, fill #0

## 2023-05-17 MED ORDER — AJOVY 225 MG/1.5ML ~~LOC~~ SOAJ
225.0000 mg | SUBCUTANEOUS | 11 refills | Status: AC
  Filled 2023-05-17: qty 1.5, 30d supply, fill #0

## 2023-05-17 NOTE — Patient Instructions (Signed)
Start Medrol Dosepak for the next 6 days Start Ajovy as preventive medication, sample given to patient Continue with Maxalt, can take it with Naprosyn as needed for headache Follow-up in 3 months or sooner if worse.

## 2023-05-17 NOTE — Progress Notes (Signed)
[facility]  PATIENT: Cindy Long DOB: 02-03-1976  REQUESTING CLINICIAN: Plotnikov, Georgina Quint, MD HISTORY FROM: Patient  REASON FOR VISIT: Headaches    HISTORICAL  CHIEF COMPLAINT:  Chief Complaint  Patient presents with   Migraine    Rm12, daughter 45 yr old present, migraine: daily in past 30 days. Also has nausea,  Triggers: light, computer screen     INTERVAL HISTORY 05/17/2023 Patient presents for follow-up, she is accompanied by her 45-year-old daughter.  She reports her migraine frequency has worsened.  She has been having daily headaches for the past 30 days, with light sensitivity, nausea.  Gabapentin was not helpful for headaches, Maxalt only provide little relief.  Again as preventive she has started antidepression, antihypertensive and gabapentin without any relief.  Will try on one of the new CGRP's.  HISTORY OF PRESENT ILLNESS:  This is a 47 year old woman past medical history of hypertension, hyperlipidemia, diabetes mellitus, history of migraine since 2018 who is presenting to establish care.  Patient reports with her migraines, she used to take Maxalt with improvement of her symptoms, but lately her frequency has increased.  Currently she is getting about 10 plus headaches days per month.  She is not on any preventive medication.  She describes her headache as left-sided, starts at the left temple, radiates to the back, on the left side; sometimes there is left-sided neck pain.  Sometimes with the headache she does have word finding difficulty.  There are also migrainous features including photophobia, phonophobia and nausea. She denies focal neurological deficits with her migraines.    Headache History and Characteristics: Onset: 2018 Location: Start on the left, then move to the back  Quality:  Pressure  Intensity:8/10.  Duration:Lasting all day Migrainous Features: Photophobia, phonophobia, nausea Aura: No  History of brain injury or  tumor: No  Family history: Grandmother and sister  Motion sickness: no Cardiac history: no  Prior prophylaxis: Propranolol: No but Metoprolol  Verapamil:No TCA: No Topamax: No Depakote: No Effexor: No but Lexapro Cymbalta: No Neurontin:Yes  Prior abortives: Triptan: Yes, Maxalt  Anti-emetic: No Steroids: No Ergotamine suppository: No    OTHER MEDICAL CONDITIONS: Hypertension, Hyperlipidemia, DMII, Chron's disease   REVIEW OF SYSTEMS: Full 14 system review of systems performed and negative with exception of: As noted in the HPI   ALLERGIES: Allergies  Allergen Reactions   Metronidazole Swelling    Throat swelling   Shellfish Allergy Swelling    Swelling of the throat   Empagliflozin-Linagliptin Other (See Comments)   Lisinopril Other (See Comments)   Metformin Hcl Other (See Comments)   Prednisone Other (See Comments)   Dulaglutide Rash   Liraglutide Rash   Nurtec [Rimegepant Sulfate] Rash    HOME MEDICATIONS: Outpatient Medications Prior to Visit  Medication Sig Dispense Refill   acetaminophen (TYLENOL) 500 MG tablet Take 1,000 mg by mouth every 6 (six) hours as needed for moderate pain or headache.     albuterol (VENTOLIN HFA) 108 (90 Base) MCG/ACT inhaler Inhale 1-2 puffs into the lungs every 6 (six) hours as needed for wheezing or shortness of breath. 18 g 5   aspirin EC 81 MG tablet Take 1 tablet (81 mg total) by mouth daily. Swallow whole. 90 tablet 3   atorvastatin (LIPITOR) 20 MG tablet Take 1 tablet (20 mg total) by mouth daily. 90 tablet 3   Bacillus Coagulans-Inulin (PROBIOTIC) 1-250 BILLION-MG CAPS Take 1 capsule by mouth daily.     Continuous Glucose Sensor (DEXCOM G7 SENSOR)  MISC Change sensor every 10 days 9 each 4   dapagliflozin propanediol (FARXIGA) 10 MG TABS tablet Take 1 tablet (10 mg total) by mouth daily. 30 tablet 5   escitalopram (LEXAPRO) 20 MG tablet TAKE 2 TABLETS BY MOUTH ONCE A DAY. 180 tablet 3   fluconazole (DIFLUCAN) 150 MG  tablet Take 1 tablet by mouth daily once as needed yeast infection--this is a prescription for 3 courses as needed 13 tablet 0   gabapentin (NEURONTIN) 300 MG capsule Take 1 capsule (300 mg total) by mouth at bedtime. 90 capsule 1   hyoscyamine (LEVBID) 0.375 MG 12 hr tablet Take 1 tablet  by mouth as needed as directed (30 days). 30 tablet 1   inFLIXimab (REMICADE IV) Inject into the vein every 8 (eight) weeks. Pt. Is unsure of dosing, but goes to have this done every 8 weeks     Insulin Disposable Pump (OMNIPOD 5 G6 INTRO, GEN 5,) KIT Change pod every 2 days as directed (Total daily dose of 100 units) 1 kit 0   Insulin Disposable Pump (OMNIPOD 5 G6 PODS, GEN 5,) MISC Change pod every 2 days as directed (TDD of 100 units) 30 each 4   insulin lispro (HUMALOG) 100 UNIT/ML injection Up to 200 units per day/via insulin pump Injection 30 days 60 mL 11   Insulin Pen Needle 32G X 4 MM MISC by Does not apply route. BD U/F Pen Needles Nano 4 mm x 32 G; use with insulin     insulin regular human CONCENTRATED (HUMULIN R U-500 KWIKPEN) 500 UNIT/ML KwikPen Inject 5-50 Units into the skin 3 (three) times daily with meals. Take 10 units in the morning, 45 units at lunch, 25 units at dinner. (Patient taking differently: Inject 5-50 Units into the skin 3 (three) times daily with meals. Take 25 units in the morning, 50 units at lunch, 25 units at dinner.) 24 mL 3   LORazepam (ATIVAN) 1 MG tablet Take 1/2 tablet by mouth once daily as needed 20 tablet 1   losartan (COZAAR) 50 MG tablet Take 1 tablet (50 mg total) by mouth daily. 90 tablet 1   Magnesium 250 MG TABS Take 1 tablet by mouth daily.     meclizine (ANTIVERT) 12.5 MG tablet Take 1 tablet (12.5 mg total) by mouth 3 (three) times daily as needed for dizziness. 30 tablet 1   medroxyPROGESTERone Acetate 150 MG/ML SUSY Inject 1 syringe into the muscle every 11-12 weeks 1 mL 11   metoprolol succinate (TOPROL-XL) 25 MG 24 hr tablet Take 1 tablet (25 mg total) by mouth  daily. May take additional half tablet as needed for tachycardia, palpitations. 135 tablet 3   nystatin-triamcinolone (MYCOLOG II) cream APPLY TO THE AFFECTED AREA(S) TWO TIMES DAILY AS DIRECTED 20 g 2   promethazine (PHENERGAN) 25 MG tablet TAKE 1 TABLET BY MOUTH EVERY 8 HOURS 90 tablet 1   Riboflavin (VITAMIN B-2 PO) Take 1 tablet by mouth daily.     rizatriptan (MAXALT) 10 MG tablet Take 1 tablet (10 mg total) by mouth once as needed for up to 1 dose for migraine (Migraine headaches). May repeat in 2 hours if needed 18 tablet 6   spironolactone (ALDACTONE) 25 MG tablet Take 0.5 tablets (12.5 mg total) by mouth daily. 15 tablet 2   SYRINGE-NEEDLE, DISP, 3 ML (B-D 3CC LUER-LOK SYR 25GX5/8") 25G X 5/8" 3 ML MISC Use to inject B12 under the skin 50 each 3   tirzepatide (MOUNJARO) 5 MG/0.5ML Pen Inject  5 mg into the skin once a week. 2 mL 12   ustekinumab (STELARA) 130 MG/26ML SOLN injection Infuse 3 vials IV over at least 1 hour 78 mL 0   dicyclomine (BENTYL) 20 MG tablet Take 1 tablet (20 mg total) by mouth 2 (two) times daily as needed for abdominal cramping. 20 tablet 0   insulin glargine (LANTUS SOLOSTAR) 100 UNIT/ML Solostar Pen Inject 20 Units into the skin once for 1 dose. 0.2 mL 0   insulin regular human CONCENTRATED (HUMULIN R U-500 KWIKPEN) 500 UNIT/ML KwikPen Inject 10 units into the skin at breakfast, 40 units at lunch, 20 units at evening meal 24 mL 3   Na Sulfate-K Sulfate-Mg Sulf 17.5-3.13-1.6 GM/177ML SOLN Use as directed 354 mL 0   No facility-administered medications prior to visit.    PAST MEDICAL HISTORY: Past Medical History:  Diagnosis Date   Chest pressure 11/18/2021   Crohn disease (HCC)    Diabetes mellitus    IDDM, Type 2   Essential hypertension 11/18/2021   GERD (gastroesophageal reflux disease)    no meds currently   High cholesterol    Hypertension    Mastitis    right breast   Postpartum care following cesarean delivery (2/10) 12/17/2013   Pure  hypercholesterolemia 11/18/2021    PAST SURGICAL HISTORY: Past Surgical History:  Procedure Laterality Date   CERVICAL CERCLAGE     CERVICAL CERCLAGE N/A 06/21/2013   Procedure: McDonald CERCLAGE CERVICAL;  Surgeon: Serita Kyle, MD;  Location: [facility];  Service: Gynecology;  Laterality: N/A;   CESAREAN SECTION  2008   CESAREAN SECTION N/A 12/17/2013   Procedure: Repeat CESAREAN SECTION with Cerclage Removal;  Surgeon: Serita Kyle, MD;  Location: [facility];  Service: Obstetrics;  Laterality: N/A;  EDD: 12/22/13   CHOLECYSTECTOMY     CHOLECYSTECTOMY OPEN  08/08/2011   GANGLION CYST EXCISION Right 1997   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    FAMILY HISTORY: Family History  Problem Relation Age of Onset   Hypertension Mother    Hypercholesterolemia Mother    Other Sister        brain tumor   Headache Sister    Diabetes Maternal Aunt    Hypertension Maternal Grandmother    Cancer Maternal Grandmother        breast, colon   Diabetes Maternal Grandmother    Headache Maternal Grandmother    Hypertension Maternal Grandfather    Healthy Daughter    Healthy Daughter    Heart attack Cousin 49    SOCIAL HISTORY: Social History   Socioeconomic History   Marital status: Married    Spouse name: jermaine   Number of children: 2   Years of education: Not on file   Highest education level: Some college, no degree  Occupational History   Not on file  Tobacco Use   Smoking status: Never    Passive exposure: Never   Smokeless tobacco: Never  Vaping Use   Vaping Use: Never used  Substance and Sexual Activity   Alcohol use: Yes    Alcohol/week: 3.0 standard drinks of alcohol    Types: 3 Standard drinks or equivalent per week    Comment: occasionally   Drug use: No   Sexual activity: Yes    Birth control/protection: Injection  Other Topics Concern   Not on file  Social History Narrative   LPN   Social Determinants of Health   Financial  Resource Strain: Low Risk  (11/18/2021)   Overall Financial  Resource Strain (CARDIA)    Difficulty of Paying Living Expenses: Not hard at all  Food Insecurity: No Food Insecurity (11/18/2021)   Hunger Vital Sign    Worried About Running Out of Food in the Last Year: Never true    Ran Out of Food in the Last Year: Never true  Transportation Needs: No Transportation Needs (11/18/2021)   PRAPARE - Administrator, Civil Service (Medical): No    Lack of Transportation (Non-Medical): No  Physical Activity: Inactive (11/18/2021)   Exercise Vital Sign    Days of Exercise per Week: 0 days    Minutes of Exercise per Session: 0 min  Stress: Not on file  Social Connections: Not on file  Intimate Partner Violence: Not on file    PHYSICAL EXAM  GENERAL EXAM/CONSTITUTIONAL: Vitals:  Vitals:   05/17/23 1106  BP: 114/71  Pulse: 92  Weight: 162 lb (73.5 kg)  Height: 5\' 6"  (1.676 m)   Body mass index is 26.15 kg/m. Wt Readings from Last 3 Encounters:  05/17/23 162 lb (73.5 kg)  05/05/23 160 lb (72.6 kg)  04/28/23 161 lb (73 kg)   Patient is in no distress; well developed, nourished and groomed; neck is supple  MUSCULOSKELETAL: Gait, strength, tone, movements noted in Neurologic exam below  NEUROLOGIC: MENTAL STATUS:      No data to display         awake, alert, oriented to person, place and time recent and remote memory intact normal attention and concentration language fluent, comprehension intact, naming intact fund of knowledge appropriate  CRANIAL NERVE:  2nd - no papilledema or hemorrhages on fundoscopic exam 2nd, 3rd, 4th, 6th - pupils equal and reactive to light, visual fields full to confrontation, extraocular muscles intact, no nystagmus 5th - facial sensation symmetric 7th - facial strength symmetric 8th - hearing intact 9th - palate elevates symmetrically, uvula midline 11th - shoulder shrug symmetric 12th - tongue protrusion midline  MOTOR:  normal  bulk and tone, full strength in the BUE, BLE  SENSORY:  normal and symmetric to light touch, pinprick  COORDINATION:  finger-nose-finger, fine finger movements normal  GAIT/STATION:  normal   DIAGNOSTIC DATA (LABS, IMAGING, TESTING) - I reviewed patient records, labs, notes, testing and imaging myself where available.  Lab Results  Component Value Date   WBC 9.0 02/20/2023   HGB 16.9 (H) 02/20/2023   HCT 50.5 (H) 02/20/2023   MCV 83.7 02/20/2023   PLT 278.0 02/20/2023      Component Value Date/Time   NA 136 02/20/2023 1036   NA 140 11/18/2021 1605   K 4.3 02/20/2023 1036   CL 102 02/20/2023 1036   CO2 22 02/20/2023 1036   GLUCOSE 229 (H) 02/20/2023 1036   BUN 8 02/20/2023 1036   BUN 9 11/18/2021 1605   CREATININE 0.76 02/20/2023 1036   CALCIUM 10.0 02/20/2023 1036   PROT 8.4 (H) 02/20/2023 1036   ALBUMIN 4.8 02/20/2023 1036   AST 28 02/20/2023 1036   ALT 34 02/20/2023 1036   ALKPHOS 77 02/20/2023 1036   BILITOT 1.2 02/20/2023 1036   GFRNONAA >60 12/19/2022 1208   GFRAA >60 11/12/2019 1235   Lab Results  Component Value Date   CHOL 116 03/05/2022   HDL 34 (L) 03/05/2022   LDLCALC 68 03/05/2022   TRIG 71 03/05/2022   CHOLHDL 3.4 03/05/2022   Lab Results  Component Value Date   HGBA1C 11.0 (H) 02/20/2023   Lab Results  Component Value Date  VITAMINB12 206 (L) 07/16/2021   Lab Results  Component Value Date   TSH 0.89 08/11/2022    MRI Brain 02/28/2023 1. No acute intracranial process. No specific etiology for headaches identified. 2. Air-fluid level in the right maxillary sinus, which can be seen in the setting of acute sinusitis. 3. On the post contrast-enhanced sequences there appears to be focal narrowing in the distal right M1. This may be artifactual. Consider further evaluation with an MRA for more definitive characterization in the exclude possibility of vascular stenosis.    ASSESSMENT AND PLAN  47 y.o. year old female with hypertension,  hyperlipidemia, diabetes mellitus type 2, chronic disease who is presenting with management of her migraines.  Currently she is reporting worsening of her migraine, in the past month she had 30 days of migraine headaches.  She has failed preventive medication including antidepressant, antihypertensive, Neurontin, and could not tolerate Nurtec due to side effect.  Plan for now is to start her on Ajovy monthly injection.  I gave her samples in the office and we will also give her Medrol Dosepak to break this this current cycle.  I will see her in 3 months for follow-up but patient understands to contact me sooner if her headache frequency has not improved.   1. 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   No orders of the defined types were placed in this encounter.   Meds ordered this encounter  Medications   methylPREDNISolone (MEDROL DOSEPAK) 4 MG TBPK tablet    Sig: Take as directed on package.  (6-5-4-3-2-1).    Dispense:  21 tablet    Refill:  0   Fremanezumab-vfrm (AJOVY) 225 MG/1.5ML SOAJ    Sig: Inject 225 mg into the skin every 30 (thirty) days.    Dispense:  1.68 mL    Refill:  11    Return in about 3 months (around 08/17/2023).    Windell Norfolk, MD 05/17/2023, 5:04 PM  [facility] [address] [place], [place] [address] [phone]

## 2023-05-18 ENCOUNTER — Other Ambulatory Visit: Payer: Self-pay

## 2023-05-23 ENCOUNTER — Ambulatory Visit (INDEPENDENT_AMBULATORY_CARE_PROVIDER_SITE_OTHER): Payer: Commercial Managed Care - PPO | Admitting: Internal Medicine

## 2023-05-23 ENCOUNTER — Encounter: Payer: Self-pay | Admitting: Internal Medicine

## 2023-05-23 ENCOUNTER — Other Ambulatory Visit (HOSPITAL_COMMUNITY): Payer: Self-pay

## 2023-05-23 VITALS — BP 130/70 | HR 79 | Temp 98.6°F | Ht 66.0 in | Wt 169.0 lb

## 2023-05-23 DIAGNOSIS — F5102 Adjustment insomnia: Secondary | ICD-10-CM

## 2023-05-23 DIAGNOSIS — G43009 Migraine without aura, not intractable, without status migrainosus: Secondary | ICD-10-CM | POA: Diagnosis not present

## 2023-05-23 DIAGNOSIS — E11618 Type 2 diabetes mellitus with other diabetic arthropathy: Secondary | ICD-10-CM | POA: Diagnosis not present

## 2023-05-23 DIAGNOSIS — Z794 Long term (current) use of insulin: Secondary | ICD-10-CM

## 2023-05-23 DIAGNOSIS — E538 Deficiency of other specified B group vitamins: Secondary | ICD-10-CM

## 2023-05-23 DIAGNOSIS — I10 Essential (primary) hypertension: Secondary | ICD-10-CM

## 2023-05-23 DIAGNOSIS — K50919 Crohn's disease, unspecified, with unspecified complications: Secondary | ICD-10-CM

## 2023-05-23 DIAGNOSIS — E1165 Type 2 diabetes mellitus with hyperglycemia: Secondary | ICD-10-CM

## 2023-05-23 DIAGNOSIS — Z0000 Encounter for general adult medical examination without abnormal findings: Secondary | ICD-10-CM | POA: Diagnosis not present

## 2023-05-23 MED ORDER — ESCITALOPRAM OXALATE 20 MG PO TABS
40.0000 mg | ORAL_TABLET | Freq: Every day | ORAL | 3 refills | Status: DC
  Filled 2023-05-23: qty 180, 90d supply, fill #0
  Filled 2023-11-27 (×2): qty 180, 90d supply, fill #1

## 2023-05-23 MED ORDER — LORAZEPAM 1 MG PO TABS
0.5000 mg | ORAL_TABLET | Freq: Every day | ORAL | 1 refills | Status: DC | PRN
  Filled 2023-05-23: qty 30, 60d supply, fill #0

## 2023-05-23 MED ORDER — DAPAGLIFLOZIN PROPANEDIOL 10 MG PO TABS
10.0000 mg | ORAL_TABLET | Freq: Every day | ORAL | 3 refills | Status: DC
  Filled 2023-05-23: qty 30, 30d supply, fill #0
  Filled 2023-08-04: qty 30, 30d supply, fill #1
  Filled 2023-09-17: qty 30, 30d supply, fill #2
  Filled 2023-11-27 – 2023-12-23 (×3): qty 30, 30d supply, fill #3
  Filled 2024-02-15 – 2024-02-17 (×2): qty 30, 30d supply, fill #4
  Filled 2024-03-21: qty 30, 30d supply, fill #5
  Filled 2024-05-11: qty 30, 30d supply, fill #6

## 2023-05-23 MED ORDER — LOSARTAN POTASSIUM 50 MG PO TABS
50.0000 mg | ORAL_TABLET | Freq: Every day | ORAL | 3 refills | Status: DC
  Filled 2023-05-23: qty 90, 90d supply, fill #0
  Filled 2023-10-08: qty 90, 90d supply, fill #1
  Filled 2024-04-17: qty 90, 90d supply, fill #2

## 2023-05-23 MED ORDER — SPIRONOLACTONE 25 MG PO TABS
12.5000 mg | ORAL_TABLET | Freq: Every day | ORAL | 3 refills | Status: DC
  Filled 2023-05-23: qty 45, 90d supply, fill #0
  Filled 2023-08-04: qty 45, 90d supply, fill #1
  Filled 2023-11-27 – 2023-12-23 (×4): qty 45, 90d supply, fill #2
  Filled 2024-02-15 – 2024-05-11 (×2): qty 45, 90d supply, fill #3

## 2023-05-23 MED ORDER — ZAVZPRET 10 MG/ACT NA SOLN: 1.0000 | Freq: Once | NASAL | 3 refills | Status: DC | PRN

## 2023-05-23 NOTE — Assessment & Plan Note (Signed)
 Continue on vitamin B12

## 2023-05-23 NOTE — Patient Instructions (Signed)
 Sign up for [employer] [facility] ( via Libby app on your phone or your ipad). If you don't have a library card  - go to any library branch. They will set you up in 15 minutes. It is free. You can check out books to read and to listen, check out magazines and newspapers, movies etc.   

## 2023-05-23 NOTE — Assessment & Plan Note (Signed)
Almost daily headaches.  Refractory.  Possibly stress related.  Will try Zavzpret nasal spray as needed.  Continue Lexapro.  Started Ajovy.  F/u w/Dr Teresa Coombs

## 2023-05-23 NOTE — Progress Notes (Signed)
Subjective:  Patient ID: Cindy Long, female    DOB: [birth year]  Age: 47 y.o. MRN: [id number]  CC: Annual Exam   HPI Cindy Long presents for a well exam  Headache is complaining of chronic migraines almost on daily basis.  Cindy Long was prescribed Medrol pack, but had to stop it due to high sugars and the fact that it did not help.  Cindy Long used Nurtec, but had some side effects -Cindy Long did not like how it made her feel  Outpatient Medications Prior to Visit  Medication Sig Dispense Refill   acetaminophen (TYLENOL) 500 MG tablet Take 1,000 mg by mouth every 6 (six) hours as needed for moderate pain or headache.     albuterol (VENTOLIN HFA) 108 (90 Base) MCG/ACT inhaler Inhale 1-2 puffs into the lungs every 6 (six) hours as needed for wheezing or shortness of breath. 18 g 5   aspirin EC 81 MG tablet Take 1 tablet (81 mg total) by mouth daily. Swallow whole. 90 tablet 3   atorvastatin (LIPITOR) 20 MG tablet Take 1 tablet (20 mg total) by mouth daily. 90 tablet 3   Bacillus Coagulans-Inulin (PROBIOTIC) 1-250 BILLION-MG CAPS Take 1 capsule by mouth daily.     Continuous Glucose Sensor (DEXCOM G7 SENSOR) MISC Change sensor every 10 days 9 each 4   fluconazole (DIFLUCAN) 150 MG tablet Take 1 tablet by mouth daily once as needed yeast infection--this is a prescription for 3 courses as needed 13 tablet 0   Fremanezumab-vfrm (AJOVY) 225 MG/1.5ML SOAJ Inject 225 mg into the skin every 30 (thirty) days. 1.5 mL 11   gabapentin (NEURONTIN) 300 MG capsule Take 1 capsule (300 mg total) by mouth at bedtime. 90 capsule 1   hyoscyamine (LEVBID) 0.375 MG 12 hr tablet Take 1 tablet  by mouth as needed as directed (30 days). 30 tablet 1   inFLIXimab (REMICADE IV) Inject into the vein every 8 (eight) weeks. Pt. Is unsure of dosing, but goes to have this done every 8 weeks     Insulin Disposable Pump (OMNIPOD 5 G6 INTRO, GEN 5,) KIT Change pod every 2 days as directed (Total daily dose of 100 units) 1 kit 0    Insulin Disposable Pump (OMNIPOD 5 G6 PODS, GEN 5,) MISC Change pod every 2 days as directed (TDD of 100 units) 30 each 4   insulin lispro (HUMALOG) 100 UNIT/ML injection Up to 200 units per day/via insulin pump Injection 30 days 60 mL 11   Insulin Pen Needle 32G X 4 MM MISC by Does not apply route. BD U/F Pen Needles Nano 4 mm x 32 G; use with insulin     insulin regular human CONCENTRATED (HUMULIN R U-500 KWIKPEN) 500 UNIT/ML KwikPen Inject 5-50 Units into the skin 3 (three) times daily with meals. Take 10 units in the morning, 45 units at lunch, 25 units at dinner. (Patient taking differently: Inject 5-50 Units into the skin 3 (three) times daily with meals. Take 25 units in the morning, 50 units at lunch, 25 units at dinner.) 24 mL 3   Magnesium 250 MG TABS Take 1 tablet by mouth daily.     meclizine (ANTIVERT) 12.5 MG tablet Take 1 tablet (12.5 mg total) by mouth 3 (three) times daily as needed for dizziness. 30 tablet 1   medroxyPROGESTERone Acetate 150 MG/ML SUSY Inject 1 syringe into the muscle every 11-12 weeks 1 mL 11   metoprolol succinate (TOPROL-XL) 25 MG 24 hr tablet Take 1 tablet (25  mg total) by mouth daily. May take additional half tablet as needed for tachycardia, palpitations. 135 tablet 3   nystatin-triamcinolone (MYCOLOG II) cream APPLY TO THE AFFECTED AREA(S) TWO TIMES DAILY AS DIRECTED 20 g 2   promethazine (PHENERGAN) 25 MG tablet TAKE 1 TABLET BY MOUTH EVERY 8 HOURS 90 tablet 1   Riboflavin (VITAMIN B-2 PO) Take 1 tablet by mouth daily.     rizatriptan (MAXALT) 10 MG tablet Take 1 tablet (10 mg total) by mouth once as needed for up to 1 dose for migraine (Migraine headaches). May repeat in 2 hours if needed 18 tablet 6   SYRINGE-NEEDLE, DISP, 3 ML (B-D 3CC LUER-LOK SYR 25GX5/8") 25G X 5/8" 3 ML MISC Use to inject B12 under the skin 50 each 3   tirzepatide (MOUNJARO) 5 MG/0.5ML Pen Inject 5 mg into the skin once a week. 2 mL 12   dapagliflozin propanediol (FARXIGA) 10 MG TABS  tablet Take 1 tablet (10 mg total) by mouth daily. 30 tablet 5   escitalopram (LEXAPRO) 20 MG tablet TAKE 2 TABLETS BY MOUTH ONCE A DAY. 180 tablet 3   LORazepam (ATIVAN) 1 MG tablet Take 1/2 tablet by mouth once daily as needed 20 tablet 1   losartan (COZAAR) 50 MG tablet Take 1 tablet (50 mg total) by mouth daily. 90 tablet 1   methylPREDNISolone (MEDROL DOSEPAK) 4 MG TBPK tablet Take as directed on package.  (6-5-4-3-2-1). 21 tablet 0   spironolactone (ALDACTONE) 25 MG tablet Take 0.5 tablets (12.5 mg total) by mouth daily. 15 tablet 2   ustekinumab (STELARA) 130 MG/26ML SOLN injection Infuse 3 vials IV over at least 1 hour 78 mL 0   No facility-administered medications prior to visit.    ROS: Review of Systems  Constitutional:  Positive for unexpected weight change. Negative for activity change, appetite change, chills and fatigue.  HENT:  Negative for congestion, mouth sores and sinus pressure.   Eyes:  Negative for visual disturbance.  Respiratory:  Negative for cough and chest tightness.   Gastrointestinal:  Negative for abdominal pain and nausea.  Genitourinary:  Negative for difficulty urinating, frequency and vaginal pain.  Musculoskeletal:  Negative for back pain and gait problem.  Skin:  Negative for pallor and rash.  Neurological:  Positive for headaches. Negative for dizziness, tremors, weakness, light-headedness and numbness.  Psychiatric/Behavioral:  Negative for confusion and sleep disturbance.     Objective:  BP 130/70 (BP Location: Right Arm, Patient Position: Sitting, Cuff Size: Large)   Pulse 79   Temp 98.6 F (37 C) (Oral)   Ht 5\' 6"  (1.676 m)   Wt 169 lb (76.7 kg)   SpO2 98%   BMI 27.28 kg/m   BP Readings from Last 3 Encounters:  05/23/23 130/70  05/17/23 114/71  05/05/23 127/75    Wt Readings from Last 3 Encounters:  05/23/23 169 lb (76.7 kg)  05/17/23 162 lb (73.5 kg)  05/05/23 160 lb (72.6 kg)    Physical Exam Constitutional:      General:  Cindy Long is not in acute distress.    Appearance: Cindy Long is well-developed. Cindy Long is obese.  HENT:     Head: Normocephalic.     Right Ear: External ear normal.     Left Ear: External ear normal.     Nose: Nose normal.  Eyes:     General:        Right eye: No discharge.        Left eye: No discharge.  Conjunctiva/sclera: Conjunctivae normal.     Pupils: Pupils are equal, round, and reactive to light.  Neck:     Thyroid: No thyromegaly.     Vascular: No JVD.     Trachea: No tracheal deviation.  Cardiovascular:     Rate and Rhythm: Normal rate and regular rhythm.     Heart sounds: Normal heart sounds.  Pulmonary:     Effort: No respiratory distress.     Breath sounds: No stridor. No wheezing.  Abdominal:     General: Bowel sounds are normal. There is no distension.     Palpations: Abdomen is soft. There is no mass.     Tenderness: There is no abdominal tenderness. There is no guarding or rebound.  Musculoskeletal:        General: No tenderness.     Cervical back: Normal range of motion and neck supple. No rigidity.  Lymphadenopathy:     Cervical: No cervical adenopathy.  Skin:    Findings: No erythema or rash.  Neurological:     Cranial Nerves: No cranial nerve deficit.     Motor: No abnormal muscle tone.     Coordination: Coordination normal.     Deep Tendon Reflexes: Reflexes normal.  Psychiatric:        Behavior: Behavior normal.        Thought Content: Thought content normal.        Judgment: Judgment normal.     Lab Results  Component Value Date   WBC 9.0 02/20/2023   HGB 16.9 (H) 02/20/2023   HCT 50.5 (H) 02/20/2023   PLT 278.0 02/20/2023   GLUCOSE 229 (H) 02/20/2023   CHOL 116 03/05/2022   TRIG 71 03/05/2022   HDL 34 (L) 03/05/2022   LDLCALC 68 03/05/2022   ALT 34 02/20/2023   AST 28 02/20/2023   NA 136 02/20/2023   K 4.3 02/20/2023   CL 102 02/20/2023   CREATININE 0.76 02/20/2023   BUN 8 02/20/2023   CO2 22 02/20/2023   TSH 0.89 08/11/2022   HGBA1C 11.0  (H) 02/20/2023    No results found.  Assessment & Plan:   Problem List Items Addressed This Visit     Type II diabetes mellitus (HCC)    Overall better.  Obtain hemoglobin A1c, urine microalbumin Using OmniPod insulin pump, Dexcom 7      Relevant Medications   losartan (COZAAR) 50 MG tablet   dapagliflozin propanediol (FARXIGA) 10 MG TABS tablet   Other Relevant Orders   Hemoglobin A1c   Microalbumin / creatinine urine ratio   Essential hypertension   Relevant Medications   spironolactone (ALDACTONE) 25 MG tablet   losartan (COZAAR) 50 MG tablet   B12 deficiency    Continue on vitamin B12      Adjustment insomnia    Sleeping 7 hours per night.  Denies sleep apnea      Hyperglycemia due to type 2 diabetes mellitus (HCC)    Overall better.  Obtain hemoglobin A1c, urine microalbumin Using OmniPod insulin pump, Dexcom 7      Relevant Medications   losartan (COZAAR) 50 MG tablet   dapagliflozin propanediol (FARXIGA) 10 MG TABS tablet   Crohn disease (HCC)    Stable on current therapy with Remicade      Well adult exam - Primary    We discussed age appropriate health related issues, including available/recomended screening tests and vaccinations. Labs were ordered to be later reviewed . All questions were answered. We discussed one or more  of the following - seat belt use, use of sunscreen/sun exposure exercise, second hand smoke exposure, firearm use and storage, seat belt use, a need for adhering to healthy diet and exercise. Labs were ordered.  All questions were answered. Pneumonia injection next year.      Relevant Orders   TSH   Urinalysis   CBC with Differential/Platelet   Lipid panel   Comprehensive metabolic panel   Migraine headache without aura    Almost daily headaches.  Refractory.  Possibly stress related.  Will try Zavzpret nasal spray as needed.  Continue Lexapro.  Started Ajovy.  F/u w/Dr Teresa Coombs      Relevant Medications   spironolactone  (ALDACTONE) 25 MG tablet   losartan (COZAAR) 50 MG tablet   escitalopram (LEXAPRO) 20 MG tablet   Zavegepant HCl (ZAVZPRET) 10 MG/ACT SOLN      Meds ordered this encounter  Medications   spironolactone (ALDACTONE) 25 MG tablet    Sig: Take 0.5 tablets (12.5 mg total) by mouth daily.    Dispense:  45 tablet    Refill:  3   losartan (COZAAR) 50 MG tablet    Sig: Take 1 tablet (50 mg total) by mouth daily.    Dispense:  90 tablet    Refill:  3   LORazepam (ATIVAN) 1 MG tablet    Sig: Take 0.5 tablets (0.5 mg total) by mouth daily as needed.    Dispense:  60 tablet    Refill:  1   dapagliflozin propanediol (FARXIGA) 10 MG TABS tablet    Sig: Take 1 tablet (10 mg total) by mouth daily.    Dispense:  90 tablet    Refill:  3   escitalopram (LEXAPRO) 20 MG tablet    Sig: Take 2 tablets (40 mg total) by mouth daily.    Dispense:  180 tablet    Refill:  3   Zavegepant HCl (ZAVZPRET) 10 MG/ACT SOLN    Sig: Place 1 spray into the nose once as needed for up to 1 dose.    Dispense:  6 each    Refill:  3      Follow-up: No follow-ups on file.  Sonda Primes, MD

## 2023-05-23 NOTE — Assessment & Plan Note (Signed)
Stable on current therapy with Remicade

## 2023-05-23 NOTE — Assessment & Plan Note (Signed)
Overall better.  Obtain hemoglobin A1c, urine microalbumin Using OmniPod insulin pump, Dexcom 7

## 2023-05-23 NOTE — Assessment & Plan Note (Addendum)
We discussed age appropriate health related issues, including available/recomended screening tests and vaccinations. Labs were ordered to be later reviewed . All questions were answered. We discussed one or more of the following - seat belt use, use of sunscreen/sun exposure exercise, second hand smoke exposure, firearm use and storage, seat belt use, a need for adhering to healthy diet and exercise. Labs were ordered.  All questions were answered. Pneumonia injection next year.

## 2023-05-23 NOTE — Assessment & Plan Note (Addendum)
Overall better.  Obtain hemoglobin A1c, urine microalbumin Using OmniPod insulin pump, Dexcom 7

## 2023-05-23 NOTE — Assessment & Plan Note (Signed)
Sleeping 7 hours per night.  Denies sleep apnea

## 2023-05-24 ENCOUNTER — Encounter: Payer: Self-pay | Admitting: Internal Medicine

## 2023-05-24 ENCOUNTER — Encounter (HOSPITAL_COMMUNITY): Payer: Self-pay

## 2023-05-24 ENCOUNTER — Other Ambulatory Visit (HOSPITAL_COMMUNITY): Payer: Self-pay

## 2023-05-24 ENCOUNTER — Other Ambulatory Visit: Payer: Self-pay

## 2023-05-24 LAB — TSH: TSH: 2.87 u[IU]/mL (ref 0.35–5.50)

## 2023-05-24 LAB — URINALYSIS
Bilirubin Urine: NEGATIVE
Hgb urine dipstick: NEGATIVE
Ketones, ur: NEGATIVE
Leukocytes,Ua: NEGATIVE
Nitrite: NEGATIVE
Specific Gravity, Urine: 1.015 (ref 1.000–1.030)
Total Protein, Urine: NEGATIVE
Urine Glucose: 250 — AB
Urobilinogen, UA: 0.2 (ref 0.0–1.0)
pH: 6.5 (ref 5.0–8.0)

## 2023-05-24 LAB — CBC WITH DIFFERENTIAL/PLATELET
Basophils Absolute: 0 10*3/uL (ref 0.0–0.1)
Basophils Relative: 0.2 % (ref 0.0–3.0)
Eosinophils Absolute: 0.1 10*3/uL (ref 0.0–0.7)
Eosinophils Relative: 0.7 % (ref 0.0–5.0)
HCT: 43.5 % (ref 36.0–46.0)
Hemoglobin: 14.4 g/dL (ref 12.0–15.0)
Lymphocytes Relative: 34.4 % (ref 12.0–46.0)
Lymphs Abs: 2.4 10*3/uL (ref 0.7–4.0)
MCHC: 33.1 g/dL (ref 30.0–36.0)
MCV: 82.9 fl (ref 78.0–100.0)
Monocytes Absolute: 0.3 10*3/uL (ref 0.1–1.0)
Monocytes Relative: 4.7 % (ref 3.0–12.0)
Neutro Abs: 4.3 10*3/uL (ref 1.4–7.7)
Neutrophils Relative %: 60 % (ref 43.0–77.0)
Platelets: 264 10*3/uL (ref 150.0–400.0)
RBC: 5.25 Mil/uL — ABNORMAL HIGH (ref 3.87–5.11)
RDW: 13.7 % (ref 11.5–15.5)
WBC: 7.1 10*3/uL (ref 4.0–10.5)

## 2023-05-24 LAB — LIPID PANEL
Cholesterol: 116 mg/dL (ref 0–200)
HDL: 39.7 mg/dL (ref >39.00)
LDL Cholesterol: 51 mg/dL (ref 0–99)
NonHDL: 76.19
Total CHOL/HDL Ratio: 3
Triglycerides: 125 mg/dL (ref 0.0–149.0)
VLDL: 25 mg/dL (ref 0.0–40.0)

## 2023-05-24 LAB — COMPREHENSIVE METABOLIC PANEL
ALT: 22 U/L (ref 0–35)
AST: 11 U/L (ref 0–37)
Albumin: 3.8 g/dL (ref 3.5–5.2)
Alkaline Phosphatase: 64 U/L (ref 39–117)
BUN: 10 mg/dL (ref 6–23)
CO2: 26 mEq/L (ref 19–32)
Calcium: 9.4 mg/dL (ref 8.4–10.5)
Chloride: 104 mEq/L (ref 96–112)
Creatinine, Ser: 0.58 mg/dL (ref 0.40–1.20)
GFR: 109.4 mL/min (ref >60.00)
Glucose, Bld: 199 mg/dL — ABNORMAL HIGH (ref 70–99)
Potassium: 3.9 mEq/L (ref 3.5–5.1)
Sodium: 139 mEq/L (ref 135–145)
Total Bilirubin: 0.6 mg/dL (ref 0.2–1.2)
Total Protein: 6.8 g/dL (ref 6.0–8.3)

## 2023-05-24 LAB — MICROALBUMIN / CREATININE URINE RATIO
Creatinine,U: 97.8 mg/dL
Microalb Creat Ratio: 0.7 mg/g (ref 0.0–30.0)
Microalb, Ur: <0.7 mg/dL (ref 0.0–1.9)

## 2023-05-24 LAB — HEMOGLOBIN A1C: Hgb A1c MFr Bld: 7.2 % — ABNORMAL HIGH (ref 4.6–6.5)

## 2023-05-24 MED ORDER — DEXCOM G7 SENSOR MISC
4 refills | Status: DC
  Filled 2023-05-24: qty 3, 30d supply, fill #0
  Filled 2023-06-23 – 2023-06-24 (×2): qty 3, 30d supply, fill #1
  Filled 2023-07-14: qty 3, 30d supply, fill #2
  Filled 2023-08-18: qty 3, 30d supply, fill #3
  Filled 2023-09-17: qty 3, 30d supply, fill #4
  Filled 2023-10-16: qty 3, 30d supply, fill #5
  Filled 2023-11-27 – 2023-12-23 (×4): qty 3, 30d supply, fill #6
  Filled 2024-02-06 – 2024-02-09 (×2): qty 3, 30d supply, fill #7
  Filled 2024-03-21: qty 3, 30d supply, fill #8
  Filled 2024-04-29: qty 3, 30d supply, fill #9

## 2023-05-29 ENCOUNTER — Other Ambulatory Visit (HOSPITAL_COMMUNITY): Payer: Self-pay

## 2023-05-31 ENCOUNTER — Other Ambulatory Visit: Payer: Self-pay | Admitting: Oncology

## 2023-05-31 DIAGNOSIS — Z01419 Encounter for gynecological examination (general) (routine) without abnormal findings: Secondary | ICD-10-CM | POA: Diagnosis not present

## 2023-05-31 DIAGNOSIS — Z309 Encounter for contraceptive management, unspecified: Secondary | ICD-10-CM | POA: Diagnosis not present

## 2023-05-31 DIAGNOSIS — Z006 Encounter for examination for normal comparison and control in clinical research program: Secondary | ICD-10-CM

## 2023-06-02 ENCOUNTER — Ambulatory Visit: Payer: Commercial Managed Care - PPO | Admitting: Podiatry

## 2023-06-06 ENCOUNTER — Other Ambulatory Visit (HOSPITAL_COMMUNITY): Payer: Self-pay

## 2023-06-08 ENCOUNTER — Other Ambulatory Visit (HOSPITAL_COMMUNITY): Payer: Self-pay

## 2023-06-09 ENCOUNTER — Other Ambulatory Visit (HOSPITAL_COMMUNITY): Payer: Self-pay

## 2023-06-09 ENCOUNTER — Other Ambulatory Visit: Payer: Self-pay

## 2023-06-09 DIAGNOSIS — L680 Hirsutism: Secondary | ICD-10-CM | POA: Diagnosis not present

## 2023-06-09 DIAGNOSIS — E1165 Type 2 diabetes mellitus with hyperglycemia: Secondary | ICD-10-CM | POA: Diagnosis not present

## 2023-06-09 DIAGNOSIS — Z794 Long term (current) use of insulin: Secondary | ICD-10-CM | POA: Diagnosis not present

## 2023-06-09 DIAGNOSIS — N915 Oligomenorrhea, unspecified: Secondary | ICD-10-CM | POA: Diagnosis not present

## 2023-06-09 DIAGNOSIS — I10 Essential (primary) hypertension: Secondary | ICD-10-CM | POA: Diagnosis not present

## 2023-06-09 DIAGNOSIS — I2510 Atherosclerotic heart disease of native coronary artery without angina pectoris: Secondary | ICD-10-CM | POA: Diagnosis not present

## 2023-06-09 LAB — HM DIABETES EYE EXAM

## 2023-06-09 MED ORDER — MEDROXYPROGESTERONE ACETATE 150 MG/ML IM SUSY
1.0000 mL | PREFILLED_SYRINGE | INTRAMUSCULAR | 11 refills | Status: AC
  Filled 2023-06-09: qty 1, 90d supply, fill #0

## 2023-06-16 DIAGNOSIS — L680 Hirsutism: Secondary | ICD-10-CM | POA: Diagnosis not present

## 2023-06-16 DIAGNOSIS — E1165 Type 2 diabetes mellitus with hyperglycemia: Secondary | ICD-10-CM | POA: Diagnosis not present

## 2023-06-16 DIAGNOSIS — I10 Essential (primary) hypertension: Secondary | ICD-10-CM | POA: Diagnosis not present

## 2023-06-16 DIAGNOSIS — N915 Oligomenorrhea, unspecified: Secondary | ICD-10-CM | POA: Diagnosis not present

## 2023-06-17 ENCOUNTER — Encounter: Payer: Self-pay | Admitting: Internal Medicine

## 2023-06-21 ENCOUNTER — Other Ambulatory Visit (HOSPITAL_COMMUNITY): Payer: Self-pay

## 2023-06-21 DIAGNOSIS — H1033 Unspecified acute conjunctivitis, bilateral: Secondary | ICD-10-CM | POA: Diagnosis not present

## 2023-06-22 ENCOUNTER — Other Ambulatory Visit (HOSPITAL_COMMUNITY): Payer: Self-pay

## 2023-06-23 ENCOUNTER — Other Ambulatory Visit (HOSPITAL_COMMUNITY): Payer: Self-pay

## 2023-06-23 ENCOUNTER — Encounter: Payer: Self-pay | Admitting: Pharmacist

## 2023-06-23 ENCOUNTER — Other Ambulatory Visit: Payer: Self-pay

## 2023-06-24 ENCOUNTER — Other Ambulatory Visit (HOSPITAL_COMMUNITY): Payer: Self-pay

## 2023-06-26 ENCOUNTER — Other Ambulatory Visit: Payer: Self-pay

## 2023-06-26 ENCOUNTER — Other Ambulatory Visit (HOSPITAL_COMMUNITY): Payer: Self-pay

## 2023-06-26 ENCOUNTER — Other Ambulatory Visit (HOSPITAL_BASED_OUTPATIENT_CLINIC_OR_DEPARTMENT_OTHER): Payer: Self-pay

## 2023-06-27 ENCOUNTER — Other Ambulatory Visit (HOSPITAL_COMMUNITY): Payer: Self-pay

## 2023-06-27 ENCOUNTER — Other Ambulatory Visit: Payer: Self-pay

## 2023-06-28 ENCOUNTER — Other Ambulatory Visit (HOSPITAL_COMMUNITY): Payer: Self-pay

## 2023-06-30 ENCOUNTER — Encounter (HOSPITAL_COMMUNITY): Payer: Commercial Managed Care - PPO

## 2023-06-30 ENCOUNTER — Ambulatory Visit: Payer: Commercial Managed Care - PPO

## 2023-06-30 VITALS — BP 109/72 | HR 87 | Temp 98.8°F | Resp 16 | Ht 67.0 in | Wt 165.0 lb

## 2023-06-30 DIAGNOSIS — K50919 Crohn's disease, unspecified, with unspecified complications: Secondary | ICD-10-CM | POA: Diagnosis not present

## 2023-06-30 MED ORDER — METHYLPREDNISOLONE SODIUM SUCC 40 MG IJ SOLR
40.0000 mg | Freq: Once | INTRAMUSCULAR | Status: AC
  Administered 2023-06-30: 40 mg via INTRAVENOUS
  Filled 2023-06-30: qty 1

## 2023-06-30 MED ORDER — DIPHENHYDRAMINE HCL 25 MG PO CAPS
25.0000 mg | ORAL_CAPSULE | Freq: Once | ORAL | Status: AC
  Administered 2023-06-30: 25 mg via ORAL
  Filled 2023-06-30: qty 1

## 2023-06-30 MED ORDER — ACETAMINOPHEN 325 MG PO TABS
650.0000 mg | ORAL_TABLET | Freq: Once | ORAL | Status: AC
  Administered 2023-06-30: 650 mg via ORAL
  Filled 2023-06-30: qty 2

## 2023-06-30 MED ORDER — SODIUM CHLORIDE 0.9 % IV SOLN
5.0000 mg/kg | Freq: Once | INTRAVENOUS | Status: AC
  Administered 2023-06-30: 400 mg via INTRAVENOUS
  Filled 2023-06-30: qty 0

## 2023-06-30 NOTE — Progress Notes (Signed)
Diagnosis: Crohn's Disease  Provider:  Chilton Greathouse MD  Procedure: IV Infusion  IV Type: Peripheral, IV Location: L Antecubital  Avsola, Dose: 400 mg  Infusion Start Time: 0946  Infusion Stop Time: 1158  Post Infusion IV Care: Patient declined observation and Peripheral IV Discontinued  Discharge: Condition: Stable, Destination: Home . AVS Provided  Performed by:  Loney Hering, LPN

## 2023-07-07 ENCOUNTER — Other Ambulatory Visit (HOSPITAL_COMMUNITY): Payer: Self-pay

## 2023-07-12 ENCOUNTER — Other Ambulatory Visit (HOSPITAL_COMMUNITY): Payer: Self-pay

## 2023-07-14 ENCOUNTER — Other Ambulatory Visit (HOSPITAL_COMMUNITY): Payer: Self-pay

## 2023-07-15 DIAGNOSIS — Z1231 Encounter for screening mammogram for malignant neoplasm of breast: Secondary | ICD-10-CM | POA: Diagnosis not present

## 2023-07-17 ENCOUNTER — Other Ambulatory Visit (HOSPITAL_COMMUNITY): Payer: Self-pay

## 2023-07-18 ENCOUNTER — Other Ambulatory Visit (HOSPITAL_COMMUNITY): Payer: Self-pay

## 2023-07-19 ENCOUNTER — Telehealth: Payer: Self-pay | Admitting: Neurology

## 2023-07-19 NOTE — Telephone Encounter (Signed)
LVM and sent mychart msg informing pt of need to reschedule 09/12/23 appt - office closed

## 2023-07-20 ENCOUNTER — Other Ambulatory Visit (HOSPITAL_COMMUNITY): Payer: Self-pay

## 2023-07-20 ENCOUNTER — Other Ambulatory Visit: Payer: Self-pay

## 2023-07-21 ENCOUNTER — Other Ambulatory Visit (HOSPITAL_COMMUNITY): Payer: Self-pay

## 2023-07-28 ENCOUNTER — Other Ambulatory Visit (HOSPITAL_BASED_OUTPATIENT_CLINIC_OR_DEPARTMENT_OTHER): Payer: Self-pay

## 2023-07-28 MED ORDER — INFLUENZA VIRUS VACC SPLIT PF (FLUZONE) 0.5 ML IM SUSY
0.5000 mL | PREFILLED_SYRINGE | Freq: Once | INTRAMUSCULAR | 0 refills | Status: AC
  Filled 2023-07-28: qty 0.5, 1d supply, fill #0

## 2023-08-02 ENCOUNTER — Ambulatory Visit: Payer: Commercial Managed Care - PPO | Admitting: Internal Medicine

## 2023-08-02 ENCOUNTER — Encounter: Payer: Self-pay | Admitting: Internal Medicine

## 2023-08-02 ENCOUNTER — Other Ambulatory Visit: Payer: Self-pay

## 2023-08-02 VITALS — BP 122/78 | HR 111 | Temp 98.1°F | Ht 67.0 in | Wt 169.0 lb

## 2023-08-02 DIAGNOSIS — E11618 Type 2 diabetes mellitus with other diabetic arthropathy: Secondary | ICD-10-CM | POA: Diagnosis not present

## 2023-08-02 DIAGNOSIS — K50818 Crohn's disease of both small and large intestine with other complication: Secondary | ICD-10-CM

## 2023-08-02 DIAGNOSIS — K121 Other forms of stomatitis: Secondary | ICD-10-CM

## 2023-08-02 DIAGNOSIS — Z794 Long term (current) use of insulin: Secondary | ICD-10-CM

## 2023-08-02 DIAGNOSIS — K120 Recurrent oral aphthae: Secondary | ICD-10-CM | POA: Diagnosis not present

## 2023-08-02 LAB — CBC WITH DIFFERENTIAL/PLATELET
Basophils Absolute: 0 10*3/uL (ref 0.0–0.1)
Basophils Relative: 0.4 % (ref 0.0–3.0)
Eosinophils Absolute: 0.1 10*3/uL (ref 0.0–0.7)
Eosinophils Relative: 1.8 % (ref 0.0–5.0)
HCT: 42.4 % (ref 36.0–46.0)
Hemoglobin: 13.7 g/dL (ref 12.0–15.0)
Lymphocytes Relative: 38.6 % (ref 12.0–46.0)
Lymphs Abs: 2.2 10*3/uL (ref 0.7–4.0)
MCHC: 32.3 g/dL (ref 30.0–36.0)
MCV: 83.4 fl (ref 78.0–100.0)
Monocytes Absolute: 0.3 10*3/uL (ref 0.1–1.0)
Monocytes Relative: 5.7 % (ref 3.0–12.0)
Neutro Abs: 3 10*3/uL (ref 1.4–7.7)
Neutrophils Relative %: 53.5 % (ref 43.0–77.0)
Platelets: 280 10*3/uL (ref 150.0–400.0)
RBC: 5.08 Mil/uL (ref 3.87–5.11)
RDW: 13.4 % (ref 11.5–15.5)
WBC: 5.6 10*3/uL (ref 4.0–10.5)

## 2023-08-02 LAB — COMPREHENSIVE METABOLIC PANEL
ALT: 23 U/L (ref 0–35)
AST: 15 U/L (ref 0–37)
Albumin: 3.9 g/dL (ref 3.5–5.2)
Alkaline Phosphatase: 77 U/L (ref 39–117)
BUN: 6 mg/dL (ref 6–23)
CO2: 26 mEq/L (ref 19–32)
Calcium: 9.3 mg/dL (ref 8.4–10.5)
Chloride: 109 mEq/L (ref 96–112)
Creatinine, Ser: 0.67 mg/dL (ref 0.40–1.20)
GFR: 105.52 mL/min (ref >60.00)
Glucose, Bld: 124 mg/dL — ABNORMAL HIGH (ref 70–99)
Potassium: 3.6 mEq/L (ref 3.5–5.1)
Sodium: 143 mEq/L (ref 135–145)
Total Bilirubin: 0.5 mg/dL (ref 0.2–1.2)
Total Protein: 7.2 g/dL (ref 6.0–8.3)

## 2023-08-02 LAB — SEDIMENTATION RATE: Sed Rate: 41 mm/hr — ABNORMAL HIGH (ref 0–20)

## 2023-08-02 MED ORDER — TRIAMCINOLONE ACETONIDE 0.1 % MT PSTE
PASTE | OROMUCOSAL | 1 refills | Status: DC
  Filled 2023-08-02: qty 5, 30d supply, fill #0

## 2023-08-02 MED ORDER — METHYLPREDNISOLONE 4 MG PO TBPK
ORAL_TABLET | ORAL | 0 refills | Status: DC
  Filled 2023-08-02: qty 21, 6d supply, fill #0

## 2023-08-02 MED ORDER — VALACYCLOVIR HCL 500 MG PO TABS
500.0000 mg | ORAL_TABLET | Freq: Two times a day (BID) | ORAL | 1 refills | Status: DC
  Filled 2023-08-02: qty 14, 7d supply, fill #0

## 2023-08-02 NOTE — Assessment & Plan Note (Signed)
She needs seems to have one of her Crohn's disease flare-up.  She is expecting a call back from her gastroenterology office. Medrol pack.  She is unable to tolerate higher dose of prednisone.  Hopefully she will tolerate well with a Medrol pack Obtain Labs including CBC, sed rate and other labs CT of the abdomen if worse.  She will let me know tomorrow.  Remain off work until Monday

## 2023-08-02 NOTE — Progress Notes (Unsigned)
Subjective:  Patient ID: Cindy Long, female    DOB: Oct 26, 1976  Age: 47 y.o. MRN: [id number]  CC: Abdominal Pain   HPI Cindy Long presents for Crohn's flare up since Friday - diarrhea, nausea CBGs are ok   Outpatient Medications Prior to Visit  Medication Sig Dispense Refill   acetaminophen (TYLENOL) 500 MG tablet Take 1,000 mg by mouth every 6 (six) hours as needed for moderate pain or headache.     albuterol (VENTOLIN HFA) 108 (90 Base) MCG/ACT inhaler Inhale 1-2 puffs into the lungs every 6 (six) hours as needed for wheezing or shortness of breath. 18 g 5   aspirin EC 81 MG tablet Take 1 tablet (81 mg total) by mouth daily. Swallow whole. 90 tablet 3   Bacillus Coagulans-Inulin (PROBIOTIC) 1-250 BILLION-MG CAPS Take 1 capsule by mouth daily.     Continuous Glucose Sensor (DEXCOM G7 SENSOR) MISC Use every 10 days as directed three times daily 9 each 4   dapagliflozin propanediol (FARXIGA) 10 MG TABS tablet Take 1 tablet (10 mg total) by mouth daily. 90 tablet 3   escitalopram (LEXAPRO) 20 MG tablet Take 2 tablets (40 mg total) by mouth daily. 180 tablet 3   fluconazole (DIFLUCAN) 150 MG tablet Take 1 tablet by mouth daily once as needed yeast infection--this is a prescription for 3 courses as needed 13 tablet 0   gabapentin (NEURONTIN) 300 MG capsule Take 1 capsule (300 mg total) by mouth at bedtime. 90 capsule 1   hyoscyamine (LEVBID) 0.375 MG 12 hr tablet Take 1 tablet  by mouth as needed as directed (30 days). 30 tablet 1   inFLIXimab (REMICADE IV) Inject into the vein every 8 (eight) weeks. Pt. Is unsure of dosing, but goes to have this done every 8 weeks     Insulin Disposable Pump (OMNIPOD 5 G6 INTRO, GEN 5,) KIT Change pod every 2 days as directed (Total daily dose of 100 units) 1 kit 0   Insulin Disposable Pump (OMNIPOD 5 G6 PODS, GEN 5,) MISC Change pod every 2 days as directed (TDD of 100 units) 30 each 4   insulin lispro (HUMALOG) 100 UNIT/ML injection Up to  200 units per day/via insulin pump Injection 30 days 60 mL 11   Insulin Pen Needle 32G X 4 MM MISC by Does not apply route. BD U/F Pen Needles Nano 4 mm x 32 G; use with insulin     insulin regular human CONCENTRATED (HUMULIN R U-500 KWIKPEN) 500 UNIT/ML KwikPen Inject 5-50 Units into the skin 3 (three) times daily with meals. Take 10 units in the morning, 45 units at lunch, 25 units at dinner. (Patient taking differently: Inject 5-50 Units into the skin 3 (three) times daily with meals. Take 25 units in the morning, 50 units at lunch, 25 units at dinner.) 24 mL 3   LORazepam (ATIVAN) 1 MG tablet Take 0.5 tablets (0.5 mg total) by mouth daily as needed. 60 tablet 1   losartan (COZAAR) 50 MG tablet Take 1 tablet (50 mg total) by mouth daily. 90 tablet 3   Magnesium 250 MG TABS Take 1 tablet by mouth daily.     meclizine (ANTIVERT) 12.5 MG tablet Take 1 tablet (12.5 mg total) by mouth 3 (three) times daily as needed for dizziness. 30 tablet 1   medroxyPROGESTERone Acetate 150 MG/ML SUSY Inject 1 syringe into the muscle every 11-12 weeks 1 mL 11   medroxyPROGESTERone Acetate 150 MG/ML SUSY Inject 1 mL (150 mg  total) into the muscle every 3 (three) months. (every 11 - 12 weeks) 1 mL 11   metoprolol succinate (TOPROL-XL) 25 MG 24 hr tablet Take 1 tablet (25 mg total) by mouth daily. May take additional half tablet as needed for tachycardia, palpitations. 135 tablet 3   nystatin-triamcinolone (MYCOLOG II) cream APPLY TO THE AFFECTED AREA(S) TWO TIMES DAILY AS DIRECTED 20 g 2   promethazine (PHENERGAN) 25 MG tablet TAKE 1 TABLET BY MOUTH EVERY 8 HOURS 90 tablet 1   Riboflavin (VITAMIN B-2 PO) Take 1 tablet by mouth daily.     rizatriptan (MAXALT) 10 MG tablet Take 1 tablet (10 mg total) by mouth once as needed for up to 1 dose for migraine (Migraine headaches). May repeat in 2 hours if needed 18 tablet 6   spironolactone (ALDACTONE) 25 MG tablet Take 0.5 tablets (12.5 mg total) by mouth daily. 45 tablet 3    SYRINGE-NEEDLE, DISP, 3 ML (B-D 3CC LUER-LOK SYR 25GX5/8") 25G X 5/8" 3 ML MISC Use to inject B12 under the skin 50 each 3   tirzepatide (MOUNJARO) 5 MG/0.5ML Pen Inject 5 mg into the skin once a week. 2 mL 12   Zavegepant HCl (ZAVZPRET) 10 MG/ACT SOLN Place 1 spray into the nose once as needed for up to 1 dose. 6 each 3   atorvastatin (LIPITOR) 20 MG tablet Take 1 tablet (20 mg total) by mouth daily. 90 tablet 3   No facility-administered medications prior to visit.    ROS: Review of Systems  Objective:  BP 122/78 (BP Location: Right Arm, Patient Position: Sitting, Cuff Size: Normal)   Pulse (!) 111   Temp 98.1 F (36.7 C) (Oral)   Ht 5\' 7"  (1.702 m)   Wt 169 lb (76.7 kg)   SpO2 93%   BMI 26.47 kg/m   BP Readings from Last 3 Encounters:  08/02/23 122/78  06/30/23 109/72  05/23/23 130/70    Wt Readings from Last 3 Encounters:  08/02/23 169 lb (76.7 kg)  06/30/23 165 lb (74.8 kg)  05/23/23 169 lb (76.7 kg)    Physical Exam  Lab Results  Component Value Date   WBC 7.1 05/24/2023   HGB 14.4 05/24/2023   HCT 43.5 05/24/2023   PLT 264.0 05/24/2023   GLUCOSE 199 (H) 05/24/2023   CHOL 116 05/24/2023   TRIG 125.0 05/24/2023   HDL 39.70 05/24/2023   LDLCALC 51 05/24/2023   ALT 22 05/24/2023   AST 11 05/24/2023   NA 139 05/24/2023   K 3.9 05/24/2023   CL 104 05/24/2023   CREATININE 0.58 05/24/2023   BUN 10 05/24/2023   CO2 26 05/24/2023   TSH 2.87 05/24/2023   HGBA1C 7.2 (H) 05/24/2023   MICROALBUR <0.7 05/24/2023    No results found.  Assessment & Plan:   Problem List Items Addressed This Visit   None     No orders of the defined types were placed in this encounter.     Follow-up: No follow-ups on file.  Sonda Primes, MD

## 2023-08-02 NOTE — Assessment & Plan Note (Signed)
Valtrex Kenalog pst

## 2023-08-03 ENCOUNTER — Encounter: Payer: Self-pay | Admitting: Internal Medicine

## 2023-08-03 ENCOUNTER — Other Ambulatory Visit (HOSPITAL_COMMUNITY): Payer: Self-pay

## 2023-08-03 LAB — URINALYSIS
Bilirubin Urine: NEGATIVE
Hgb urine dipstick: NEGATIVE
Ketones, ur: NEGATIVE
Leukocytes,Ua: NEGATIVE
Nitrite: NEGATIVE
Specific Gravity, Urine: 1.01 (ref 1.000–1.030)
Total Protein, Urine: NEGATIVE
Urine Glucose: >=1000 — AB
Urobilinogen, UA: 0.2 (ref 0.0–1.0)
pH: 6 (ref 5.0–8.0)

## 2023-08-03 NOTE — Assessment & Plan Note (Signed)
Using OmniPod insulin pump, Dexcom 7 Glucose is in the 160 range

## 2023-08-03 NOTE — Addendum Note (Signed)
Addended by: Sumner Boast on: 08/03/2023 09:30 AM   Modules accepted: Orders

## 2023-08-04 ENCOUNTER — Other Ambulatory Visit (HOSPITAL_COMMUNITY): Payer: Self-pay

## 2023-08-04 ENCOUNTER — Other Ambulatory Visit: Payer: Self-pay

## 2023-08-04 MED ORDER — HYOSCYAMINE SULFATE ER 0.375 MG PO TB12
0.3750 mg | ORAL_TABLET | Freq: Two times a day (BID) | ORAL | 3 refills | Status: AC
  Filled 2023-08-04: qty 60, 30d supply, fill #0
  Filled 2024-02-21: qty 60, 30d supply, fill #1

## 2023-08-07 ENCOUNTER — Other Ambulatory Visit (HOSPITAL_COMMUNITY): Payer: Self-pay

## 2023-08-07 ENCOUNTER — Other Ambulatory Visit: Payer: Self-pay | Admitting: Internal Medicine

## 2023-08-11 ENCOUNTER — Other Ambulatory Visit: Payer: Self-pay

## 2023-08-11 ENCOUNTER — Encounter: Payer: Self-pay | Admitting: Internal Medicine

## 2023-08-11 ENCOUNTER — Other Ambulatory Visit (HOSPITAL_COMMUNITY): Payer: Self-pay

## 2023-08-17 ENCOUNTER — Other Ambulatory Visit: Payer: Self-pay

## 2023-08-17 DIAGNOSIS — K50819 Crohn's disease of both small and large intestine with unspecified complications: Secondary | ICD-10-CM

## 2023-08-17 DIAGNOSIS — R1011 Right upper quadrant pain: Secondary | ICD-10-CM

## 2023-08-17 DIAGNOSIS — R195 Other fecal abnormalities: Secondary | ICD-10-CM

## 2023-08-17 DIAGNOSIS — R198 Other specified symptoms and signs involving the digestive system and abdomen: Secondary | ICD-10-CM

## 2023-08-18 ENCOUNTER — Other Ambulatory Visit (HOSPITAL_COMMUNITY)
Admission: RE | Admit: 2023-08-18 | Discharge: 2023-08-18 | Disposition: A | Discharge status: Home / Self Care | Payer: Commercial Managed Care - PPO | Source: Ambulatory Visit | Attending: Oncology | Admitting: Oncology

## 2023-08-18 ENCOUNTER — Other Ambulatory Visit (HOSPITAL_COMMUNITY): Payer: Self-pay

## 2023-08-18 DIAGNOSIS — Z006 Encounter for examination for normal comparison and control in clinical research program: Secondary | ICD-10-CM | POA: Insufficient documentation

## 2023-08-18 MED ORDER — BUDESONIDE 3 MG PO CPEP
ORAL_CAPSULE | ORAL | 0 refills | Status: DC
  Filled 2023-08-18: qty 126, 60d supply, fill #0

## 2023-08-19 ENCOUNTER — Other Ambulatory Visit (HOSPITAL_COMMUNITY): Payer: Self-pay

## 2023-08-20 ENCOUNTER — Other Ambulatory Visit: Payer: Self-pay | Admitting: Obstetrics and Gynecology

## 2023-08-21 ENCOUNTER — Other Ambulatory Visit (HOSPITAL_COMMUNITY): Payer: Self-pay

## 2023-08-21 ENCOUNTER — Encounter (HOSPITAL_COMMUNITY): Payer: Self-pay

## 2023-08-22 ENCOUNTER — Other Ambulatory Visit (HOSPITAL_BASED_OUTPATIENT_CLINIC_OR_DEPARTMENT_OTHER): Payer: Self-pay

## 2023-08-24 ENCOUNTER — Other Ambulatory Visit (HOSPITAL_COMMUNITY): Payer: Self-pay

## 2023-08-25 ENCOUNTER — Ambulatory Visit (INDEPENDENT_AMBULATORY_CARE_PROVIDER_SITE_OTHER): Payer: Commercial Managed Care - PPO

## 2023-08-25 VITALS — BP 104/73 | HR 89 | Temp 98.3°F | Resp 16 | Ht 66.0 in | Wt 169.8 lb

## 2023-08-25 DIAGNOSIS — K50919 Crohn's disease, unspecified, with unspecified complications: Secondary | ICD-10-CM

## 2023-08-25 MED ORDER — ACETAMINOPHEN 325 MG PO TABS: 650.0000 mg | ORAL_TABLET | Freq: Once | ORAL | Status: DC

## 2023-08-25 MED ORDER — SODIUM CHLORIDE 0.9 % IV SOLN
5.0000 mg/kg | Freq: Once | INTRAVENOUS | Status: AC
  Administered 2023-08-25: 400 mg via INTRAVENOUS
  Filled 2023-08-25: qty 40

## 2023-08-25 MED ORDER — METHYLPREDNISOLONE SODIUM SUCC 40 MG IJ SOLR: 40.0000 mg | Freq: Once | INTRAMUSCULAR | Status: DC

## 2023-08-25 MED ORDER — DIPHENHYDRAMINE HCL 25 MG PO CAPS: 25.0000 mg | ORAL_CAPSULE | Freq: Once | ORAL | Status: DC

## 2023-08-25 NOTE — Progress Notes (Signed)
Diagnosis: Crohn's Disease  Provider:  Chilton Greathouse MD  Procedure: IV Infusion  IV Type: Peripheral, IV Location: L Antecubital  Avsola (infliximab-axxq), Dose: 400 mg  Infusion Start Time: 0912  Infusion Stop Time: 1129  Post Infusion IV Care: Peripheral IV Discontinued  Discharge: Condition: Good, Destination: Home . AVS Provided  Performed by:  Adriana Mccallum, RN

## 2023-08-28 ENCOUNTER — Ambulatory Visit: Payer: Commercial Managed Care - PPO | Admitting: Family Medicine

## 2023-09-01 ENCOUNTER — Ambulatory Visit
Admission: RE | Admit: 2023-09-01 | Discharge: 2023-09-01 | Disposition: A | Discharge status: Home / Self Care | Payer: Commercial Managed Care - PPO | Source: Ambulatory Visit | Attending: Gastroenterology | Admitting: Gastroenterology

## 2023-09-01 DIAGNOSIS — R198 Other specified symptoms and signs involving the digestive system and abdomen: Secondary | ICD-10-CM

## 2023-09-01 DIAGNOSIS — K50819 Crohn's disease of both small and large intestine with unspecified complications: Secondary | ICD-10-CM

## 2023-09-01 DIAGNOSIS — I700 Atherosclerosis of aorta: Secondary | ICD-10-CM | POA: Diagnosis not present

## 2023-09-01 DIAGNOSIS — K5730 Diverticulosis of large intestine without perforation or abscess without bleeding: Secondary | ICD-10-CM | POA: Diagnosis not present

## 2023-09-01 DIAGNOSIS — R195 Other fecal abnormalities: Secondary | ICD-10-CM

## 2023-09-01 DIAGNOSIS — N3289 Other specified disorders of bladder: Secondary | ICD-10-CM | POA: Diagnosis not present

## 2023-09-01 DIAGNOSIS — R109 Unspecified abdominal pain: Secondary | ICD-10-CM | POA: Diagnosis not present

## 2023-09-01 DIAGNOSIS — R1011 Right upper quadrant pain: Secondary | ICD-10-CM

## 2023-09-01 MED ORDER — IOPAMIDOL (ISOVUE-300) INJECTION 61%
100.0000 mL | Freq: Once | INTRAVENOUS | Status: AC | PRN
  Administered 2023-09-01: 100 mL via INTRAVENOUS

## 2023-09-04 ENCOUNTER — Encounter: Payer: Self-pay | Admitting: Internal Medicine

## 2023-09-06 ENCOUNTER — Encounter: Payer: Self-pay | Admitting: Internal Medicine

## 2023-09-06 ENCOUNTER — Ambulatory Visit (INDEPENDENT_AMBULATORY_CARE_PROVIDER_SITE_OTHER): Payer: Commercial Managed Care - PPO | Admitting: Internal Medicine

## 2023-09-06 VITALS — BP 116/70 | HR 71 | Temp 98.0°F | Ht 66.0 in | Wt 165.0 lb

## 2023-09-06 DIAGNOSIS — K50818 Crohn's disease of both small and large intestine with other complication: Secondary | ICD-10-CM

## 2023-09-06 DIAGNOSIS — N3289 Other specified disorders of bladder: Secondary | ICD-10-CM | POA: Diagnosis not present

## 2023-09-06 NOTE — Progress Notes (Signed)
Subjective:  Patient ID: Cindy Long, female    DOB: May 10, 1976  Age: 47 y.o. MRN: [id number]  CC: Results (Pt is here to discuss recent CT scan... Pt is having pain in her flank area and wants to r/o UTI)   HPI Cindy Long presents for abn bladder wall on the CT    Outpatient Medications Prior to Visit  Medication Sig Dispense Refill   acetaminophen (TYLENOL) 500 MG tablet Take 1,000 mg by mouth every 6 (six) hours as needed for moderate pain or headache.     albuterol (VENTOLIN HFA) 108 (90 Base) MCG/ACT inhaler Inhale 1-2 puffs into the lungs every 6 (six) hours as needed for wheezing or shortness of breath. 18 g 5   aspirin EC 81 MG tablet Take 1 tablet (81 mg total) by mouth daily. Swallow whole. 90 tablet 3   Bacillus Coagulans-Inulin (PROBIOTIC) 1-250 BILLION-MG CAPS Take 1 capsule by mouth daily.     budesonide (ENTOCORT EC) 3 MG 24 hr capsule Take 3 capsules by mouth for 4 weeks, then 2 capsules for 2 weeks, then 1 capsule for 2 weeks, then stop 126 capsule 0   Continuous Glucose Sensor (DEXCOM G7 SENSOR) MISC Use every 10 days as directed three times daily 9 each 4   dapagliflozin propanediol (FARXIGA) 10 MG TABS tablet Take 1 tablet (10 mg total) by mouth daily. 90 tablet 3   escitalopram (LEXAPRO) 20 MG tablet Take 2 tablets (40 mg total) by mouth daily. 180 tablet 3   fluconazole (DIFLUCAN) 150 MG tablet Take 1 tablet by mouth daily once as needed yeast infection--this is a prescription for 3 courses as needed 13 tablet 0   gabapentin (NEURONTIN) 300 MG capsule Take 1 capsule (300 mg total) by mouth at bedtime. 90 capsule 1   hyoscyamine (LEVBID) 0.375 MG 12 hr tablet Take 1 tablet  by mouth as needed as directed (30 days). 30 tablet 1   hyoscyamine (LEVBID) 0.375 MG 12 hr tablet Take 1 tablet (0.375 mg total) by mouth every 12 (twelve) hours. 60 tablet 3   inFLIXimab (REMICADE IV) Inject into the vein every 8 (eight) weeks. Pt. Is unsure of dosing, but goes to  have this done every 8 weeks     Insulin Disposable Pump (OMNIPOD 5 G6 INTRO, GEN 5,) KIT Change pod every 2 days as directed (Total daily dose of 100 units) 1 kit 0   Insulin Disposable Pump (OMNIPOD 5 G6 PODS, GEN 5,) MISC Change pod every 2 days as directed (TDD of 100 units) 30 each 4   insulin lispro (HUMALOG) 100 UNIT/ML injection Up to 200 units per day/via insulin pump Injection 30 days 60 mL 11   Insulin Pen Needle 32G X 4 MM MISC by Does not apply route. BD U/F Pen Needles Nano 4 mm x 32 G; use with insulin     insulin regular human CONCENTRATED (HUMULIN R U-500 KWIKPEN) 500 UNIT/ML KwikPen Inject 5-50 Units into the skin 3 (three) times daily with meals. Take 10 units in the morning, 45 units at lunch, 25 units at dinner. (Patient taking differently: Inject 5-50 Units into the skin 3 (three) times daily with meals. Take 25 units in the morning, 50 units at lunch, 25 units at dinner.) 24 mL 3   LORazepam (ATIVAN) 1 MG tablet Take 0.5 tablets (0.5 mg total) by mouth daily as needed. 60 tablet 1   losartan (COZAAR) 50 MG tablet Take 1 tablet (50 mg total) by mouth  daily. 90 tablet 3   Magnesium 250 MG TABS Take 1 tablet by mouth daily.     meclizine (ANTIVERT) 12.5 MG tablet Take 1 tablet (12.5 mg total) by mouth 3 (three) times daily as needed for dizziness. 30 tablet 1   medroxyPROGESTERone Acetate 150 MG/ML SUSY Inject 1 syringe into the muscle every 11-12 weeks 1 mL 11   medroxyPROGESTERone Acetate 150 MG/ML SUSY Inject 1 mL (150 mg total) into the muscle every 3 (three) months. (every 11 - 12 weeks) 1 mL 11   metoprolol succinate (TOPROL-XL) 25 MG 24 hr tablet Take 1 tablet (25 mg total) by mouth daily. May take additional half tablet as needed for tachycardia, palpitations. 135 tablet 3   nystatin-triamcinolone (MYCOLOG II) cream APPLY TO THE AFFECTED AREA(S) TWO TIMES DAILY AS DIRECTED 20 g 2   promethazine (PHENERGAN) 25 MG tablet TAKE 1 TABLET BY MOUTH EVERY 8 HOURS 90 tablet 1    Riboflavin (VITAMIN B-2 PO) Take 1 tablet by mouth daily.     rizatriptan (MAXALT) 10 MG tablet Take 1 tablet (10 mg total) by mouth once as needed for up to 1 dose for migraine (Migraine headaches). May repeat in 2 hours if needed 18 tablet 6   spironolactone (ALDACTONE) 25 MG tablet Take 0.5 tablets (12.5 mg total) by mouth daily. 45 tablet 3   SYRINGE-NEEDLE, DISP, 3 ML (B-D 3CC LUER-LOK SYR 25GX5/8") 25G X 5/8" 3 ML MISC Use to inject B12 under the skin 50 each 3   tirzepatide (MOUNJARO) 5 MG/0.5ML Pen Inject 5 mg into the skin once a week. 2 mL 12   triamcinolone (KENALOG) 0.1 % paste Use as directed at bedtime 5 g 1   valACYclovir (VALTREX) 500 MG tablet Take 1 tablet (500 mg total) by mouth 2 (two) times daily. 14 tablet 1   Zavegepant HCl (ZAVZPRET) 10 MG/ACT SOLN Place 1 spray into the nose once as needed for up to 1 dose. 6 each 3   atorvastatin (LIPITOR) 20 MG tablet Take 1 tablet (20 mg total) by mouth daily. 90 tablet 3   methylPREDNISolone (MEDROL DOSEPAK) 4 MG TBPK tablet Take as directed on package instructions 21 tablet 0   No facility-administered medications prior to visit.    ROS: Review of Systems  Objective:  BP 116/70 (BP Location: Left Arm, Patient Position: Sitting, Cuff Size: Normal)   Pulse 71   Temp 98 F (36.7 C) (Oral)   Ht 5\' 6"  (1.676 m)   Wt 165 lb (74.8 kg)   SpO2 98%   BMI 26.63 kg/m   BP Readings from Last 3 Encounters:  09/06/23 116/70  08/25/23 104/73  08/02/23 122/78    Wt Readings from Last 3 Encounters:  09/06/23 165 lb (74.8 kg)  08/25/23 169 lb 12.8 oz (77 kg)  08/02/23 169 lb (76.7 kg)    Physical Exam  Lab Results  Component Value Date   WBC 5.6 08/02/2023   HGB 13.7 08/02/2023   HCT 42.4 08/02/2023   PLT 280.0 08/02/2023   GLUCOSE 124 (H) 08/02/2023   CHOL 116 05/24/2023   TRIG 125.0 05/24/2023   HDL 39.70 05/24/2023   LDLCALC 51 05/24/2023   ALT 23 08/02/2023   AST 15 08/02/2023   NA 143 08/02/2023   K 3.6  08/02/2023   CL 109 08/02/2023   CREATININE 0.67 08/02/2023   BUN 6 08/02/2023   CO2 26 08/02/2023   TSH 2.87 05/24/2023   HGBA1C 7.2 (H) 05/24/2023   MICROALBUR <  0.7 05/24/2023    CT ABDOMEN PELVIS W CONTRAST  Result Date: 09/03/2023 CLINICAL DATA:  Abdominal pain, history of Crohn's disease with complication. EXAM: CT ABDOMEN AND PELVIS WITH CONTRAST TECHNIQUE: Multidetector CT imaging of the abdomen and pelvis was performed using the standard protocol following bolus administration of intravenous contrast. RADIATION DOSE REDUCTION: This exam was performed according to the departmental dose-optimization program which includes automated exposure control, adjustment of the mA and/or kV according to patient size and/or use of iterative reconstruction technique. CONTRAST:  ISOVUE-300 IOPAMIDOL (ISOVUE-300) INJECTION 61% COMPARISON:  Multiple priors including CT September 02, 2022 FINDINGS: Lower chest: No acute abnormality. Hepatobiliary: No suspicious hepatic lesion. Gallbladder surgically absent. No biliary ductal dilation. Pancreas: No pancreatic ductal dilation or evidence of acute inflammation. Spleen: No splenomegaly. Adrenals/Urinary Tract: Bilateral adrenal glands appear normal. No hydronephrosis. Kidneys demonstrate symmetric enhancement. Wall thickening of an under distended urinary bladder. Stomach/Bowel: Radiopaque enteric contrast material traverses the rectum. Stomach is unremarkable for degree of distension. No pathologic dilation of small or large bowel. Normal appendix. No evidence of acute bowel inflammation. Mild submucosal fibrofatty infiltration of the terminal ileum. Colonic diverticulosis without findings of acute diverticulitis. Vascular/Lymphatic: Aortic atherosclerosis. Smooth IVC contours. The portal, splenic and superior mesenteric veins are patent. No pathologically enlarged abdominal or pelvic lymph nodes. Reproductive: Uterus and bilateral adnexa are unremarkable. Other:  Trace pelvic free fluid is within physiologic normal limits. No pneumoperitoneum. No walled off fluid collection. Musculoskeletal: No acute osseous abnormality. IMPRESSION: 1. No evidence of acute bowel inflammation. Mild submucosal fibrofatty infiltration of the terminal ileum, likely sequela of chronic Crohn's disease. 2. Colonic diverticulosis without findings of acute diverticulitis. 3. Wall thickening of an under distended urinary bladder, correlate with urinalysis to exclude cystitis. 4.  Aortic Atherosclerosis (ICD10-I70.0). Electronically Signed   By: Maudry Mayhew M.D.   On: 09/03/2023 16:19    Assessment & Plan:   Problem List Items Addressed This Visit     Crohn's disease of small and large intestines (HCC) - Primary   Relevant Orders   Urinalysis   Bladder wall thickening    Abdominal CT scan with incidental finding of "Wall thickening of an under distended urinary bladder, correlate with urinalysis to exclude cystitis."  Most likely it is an artifact.  Obtain urinalysis.  No symptoms of pain, vaginal discharge, fever etc. Potential and likely etiologies would include infectious cystitis, interstitial cystitis, tumor, fistula etc. will continue to observe. If urinalysis is normal, likely we will continue to observe for other symptoms.  Urology referral was offered.         No orders of the defined types were placed in this encounter.     Follow-up: No follow-ups on file.  Sonda Primes, MD

## 2023-09-06 NOTE — Assessment & Plan Note (Signed)
Abdominal CT scan with incidental finding of "Wall thickening of an under distended urinary bladder, correlate with urinalysis to exclude cystitis."  Most likely it is an artifact.  Obtain urinalysis.  No symptoms of pain, vaginal discharge, fever etc. Potential and likely etiologies would include infectious cystitis, interstitial cystitis, tumor, fistula etc. will continue to observe. If urinalysis is normal, likely we will continue to observe for other symptoms.  Urology referral was offered.

## 2023-09-07 LAB — HELIX MOLECULAR SCREEN: Genetic Analysis Overall Interpretation: NEGATIVE

## 2023-09-08 ENCOUNTER — Ambulatory Visit: Payer: Commercial Managed Care - PPO | Admitting: Family Medicine

## 2023-09-08 LAB — URINALYSIS
Bilirubin Urine: NEGATIVE
Hgb urine dipstick: NEGATIVE
Ketones, ur: NEGATIVE
Leukocytes,Ua: NEGATIVE
Nitrite: NEGATIVE
Specific Gravity, Urine: <=1.005 — AB (ref 1.000–1.030)
Total Protein, Urine: NEGATIVE
Urine Glucose: >=1000 — AB
Urobilinogen, UA: 0.2 (ref 0.0–1.0)
pH: 6 (ref 5.0–8.0)

## 2023-09-12 ENCOUNTER — Ambulatory Visit: Payer: Commercial Managed Care - PPO | Admitting: Neurology

## 2023-09-14 ENCOUNTER — Ambulatory Visit: Payer: Commercial Managed Care - PPO | Admitting: Internal Medicine

## 2023-09-15 ENCOUNTER — Encounter: Payer: Self-pay | Admitting: Family Medicine

## 2023-09-15 ENCOUNTER — Emergency Department (HOSPITAL_BASED_OUTPATIENT_CLINIC_OR_DEPARTMENT_OTHER)
Admission: EM | Admit: 2023-09-15 | Discharge: 2023-09-16 | Disposition: A | Discharge status: Home / Self Care | Payer: Commercial Managed Care - PPO

## 2023-09-15 ENCOUNTER — Ambulatory Visit (INDEPENDENT_AMBULATORY_CARE_PROVIDER_SITE_OTHER): Payer: Commercial Managed Care - PPO | Admitting: Family Medicine

## 2023-09-15 ENCOUNTER — Emergency Department (HOSPITAL_BASED_OUTPATIENT_CLINIC_OR_DEPARTMENT_OTHER): Payer: Commercial Managed Care - PPO

## 2023-09-15 ENCOUNTER — Other Ambulatory Visit: Payer: Self-pay

## 2023-09-15 ENCOUNTER — Encounter (HOSPITAL_BASED_OUTPATIENT_CLINIC_OR_DEPARTMENT_OTHER): Payer: Self-pay | Admitting: Emergency Medicine

## 2023-09-15 VITALS — BP 114/84 | HR 96 | Ht 66.0 in | Wt 170.0 lb

## 2023-09-15 DIAGNOSIS — Z79899 Other long term (current) drug therapy: Secondary | ICD-10-CM | POA: Insufficient documentation

## 2023-09-15 DIAGNOSIS — M1710 Unilateral primary osteoarthritis, unspecified knee: Secondary | ICD-10-CM

## 2023-09-15 DIAGNOSIS — D72829 Elevated white blood cell count, unspecified: Secondary | ICD-10-CM | POA: Insufficient documentation

## 2023-09-15 DIAGNOSIS — Z7982 Long term (current) use of aspirin: Secondary | ICD-10-CM | POA: Insufficient documentation

## 2023-09-15 DIAGNOSIS — E119 Type 2 diabetes mellitus without complications: Secondary | ICD-10-CM | POA: Diagnosis not present

## 2023-09-15 DIAGNOSIS — Z794 Long term (current) use of insulin: Secondary | ICD-10-CM | POA: Insufficient documentation

## 2023-09-15 DIAGNOSIS — K5730 Diverticulosis of large intestine without perforation or abscess without bleeding: Secondary | ICD-10-CM | POA: Diagnosis not present

## 2023-09-15 DIAGNOSIS — Z9049 Acquired absence of other specified parts of digestive tract: Secondary | ICD-10-CM | POA: Diagnosis not present

## 2023-09-15 DIAGNOSIS — R109 Unspecified abdominal pain: Secondary | ICD-10-CM | POA: Diagnosis not present

## 2023-09-15 DIAGNOSIS — I10 Essential (primary) hypertension: Secondary | ICD-10-CM | POA: Diagnosis not present

## 2023-09-15 DIAGNOSIS — R1084 Generalized abdominal pain: Secondary | ICD-10-CM

## 2023-09-15 DIAGNOSIS — R1033 Periumbilical pain: Secondary | ICD-10-CM | POA: Insufficient documentation

## 2023-09-15 LAB — CBC
HCT: 45.6 % (ref 36.0–46.0)
Hemoglobin: 15.3 g/dL — ABNORMAL HIGH (ref 12.0–15.0)
MCH: 27.2 pg (ref 26.0–34.0)
MCHC: 33.6 g/dL (ref 30.0–36.0)
MCV: 81.1 fL (ref 80.0–100.0)
Platelets: 297 10*3/uL (ref 150–400)
RBC: 5.62 MIL/uL — ABNORMAL HIGH (ref 3.87–5.11)
RDW: 13.7 % (ref 11.5–15.5)
WBC: 14.9 10*3/uL — ABNORMAL HIGH (ref 4.0–10.5)
nRBC: 0 % (ref 0.0–0.2)

## 2023-09-15 LAB — COMPREHENSIVE METABOLIC PANEL
ALT: 20 U/L (ref 0–44)
AST: 14 U/L — ABNORMAL LOW (ref 15–41)
Albumin: 4.2 g/dL (ref 3.5–5.0)
Alkaline Phosphatase: 63 U/L (ref 38–126)
Anion gap: 10 (ref 5–15)
BUN: 7 mg/dL (ref 6–20)
CO2: 22 mmol/L (ref 22–32)
Calcium: 9.8 mg/dL (ref 8.9–10.3)
Chloride: 108 mmol/L (ref 98–111)
Creatinine, Ser: 0.64 mg/dL (ref 0.44–1.00)
GFR, Estimated: >60 mL/min (ref >60)
Glucose, Bld: 119 mg/dL — ABNORMAL HIGH (ref 70–99)
Potassium: 4 mmol/L (ref 3.5–5.1)
Sodium: 140 mmol/L (ref 135–145)
Total Bilirubin: 0.8 mg/dL (ref <1.2)
Total Protein: 7.2 g/dL (ref 6.5–8.1)

## 2023-09-15 LAB — LIPASE, BLOOD: Lipase: 25 U/L (ref 11–51)

## 2023-09-15 LAB — HCG, QUANTITATIVE, PREGNANCY: hCG, Beta Chain, Quant, S: <1 m[IU]/mL (ref <5)

## 2023-09-15 MED ORDER — MORPHINE SULFATE (PF) 4 MG/ML IV SOLN
4.0000 mg | Freq: Once | INTRAVENOUS | Status: AC
  Administered 2023-09-15: 4 mg via INTRAVENOUS
  Filled 2023-09-15: qty 1

## 2023-09-15 MED ORDER — ONDANSETRON HCL 4 MG/2ML IJ SOLN
4.0000 mg | Freq: Once | INTRAMUSCULAR | Status: AC
  Administered 2023-09-15: 4 mg via INTRAVENOUS
  Filled 2023-09-15: qty 2

## 2023-09-15 MED ORDER — IOHEXOL 300 MG/ML  SOLN
100.0000 mL | Freq: Once | INTRAMUSCULAR | Status: AC | PRN
  Administered 2023-09-15: 100 mL via INTRAVENOUS

## 2023-09-15 NOTE — ED Provider Notes (Incomplete)
[place] EMERGENCY DEPARTMENT AT [facility] Provider Note   CSN: [id number] Arrival date & time: 09/15/23  1823     History {Add pertinent medical, surgical, social history, OB history to HPI:1} Chief Complaint  Patient presents with  . Abdominal Pain    Cindy Long is a 47 y.o. female with a history of Crohn's disease, diabetes mellitus, and hypertension who presents the ED today for abdominal pain.  Patient reports that she does have been having pain in her stomach for the past 6 weeks, but has become diffuse today.  She ate breakfast at [employer] with her daughter around 72 AM and reports feeling "okay" after. Since 1 PM, patient reports multiple episodes of diarrhea and emesis.  She does not know whether or not there was blood in it.  Denies fevers or recent sick contact.  She states that she has infusions every 8 weeks for her Crohn's disease, last infusion was in October. Denies any additional complaints or concerns at this time.    Home Medications Prior to Admission medications   Medication Sig Start Date End Date Taking? Authorizing Provider  acetaminophen (TYLENOL) 500 MG tablet Take 1,000 mg by mouth every 6 (six) hours as needed for moderate pain or headache.    [provider]  albuterol (VENTOLIN HFA) 108 (90 Base) MCG/ACT inhaler Inhale 1-2 puffs into the lungs every 6 (six) hours as needed for wheezing or shortness of breath. 12/21/22   Pincus Sanes, MD  aspirin EC 81 MG tablet Take 1 tablet (81 mg total) by mouth daily. Swallow whole. 01/17/22   Gaston Islam., NP  atorvastatin (LIPITOR) 20 MG tablet Take 1 tablet (20 mg total) by mouth daily. 04/21/23 07/23/23  Alver Sorrow, NP  Bacillus Coagulans-Inulin (PROBIOTIC) 1-250 BILLION-MG CAPS Take 1 capsule by mouth daily.    [provider]  budesonide (ENTOCORT EC) 3 MG 24 hr capsule Take 3 capsules by mouth for 4 weeks, then 2 capsules for 2 weeks, then 1 capsule for 2 weeks, then stop  08/18/23     Continuous Glucose Sensor (DEXCOM G7 SENSOR) MISC Use every 10 days as directed three times daily 05/24/23     dapagliflozin propanediol (FARXIGA) 10 MG TABS tablet Take 1 tablet (10 mg total) by mouth daily. 05/23/23   Plotnikov, Georgina Quint, MD  escitalopram (LEXAPRO) 20 MG tablet Take 2 tablets (40 mg total) by mouth daily. 05/23/23   Plotnikov, Georgina Quint, MD  fluconazole (DIFLUCAN) 150 MG tablet Take 1 tablet by mouth daily once as needed yeast infection--this is a prescription for 3 courses as needed 05/04/23   Plotnikov, Georgina Quint, MD  gabapentin (NEURONTIN) 300 MG capsule Take 1 capsule (300 mg total) by mouth at bedtime. 04/28/23 11/02/23  Windell Norfolk, MD  hyoscyamine (LEVBID) 0.375 MG 12 hr tablet Take 1 tablet  by mouth as needed as directed (30 days). 11/25/21   Kerin Salen, MD  hyoscyamine (LEVBID) 0.375 MG 12 hr tablet Take 1 tablet (0.375 mg total) by mouth every 12 (twelve) hours. 08/04/23     inFLIXimab (REMICADE IV) Inject into the vein every 8 (eight) weeks. Pt. Is unsure of dosing, but goes to have this done every 8 weeks    [provider]  Insulin Disposable Pump (OMNIPOD 5 G6 INTRO, GEN 5,) KIT Change pod every 2 days as directed (Total daily dose of 100 units) 12/14/22     Insulin Disposable Pump (OMNIPOD 5 G6 PODS, GEN 5,) MISC Change  pod every 2 days as directed (TDD of 100 units) 12/14/22     insulin lispro (HUMALOG) 100 UNIT/ML injection Up to 200 units per day/via insulin pump Injection 30 days 03/17/23     Insulin Pen Needle 32G X 4 MM MISC by Does not apply route. BD U/F Pen Needles Nano 4 mm x 32 G; use with insulin    [provider]  insulin regular human CONCENTRATED (HUMULIN R U-500 KWIKPEN) 500 UNIT/ML KwikPen Inject 5-50 Units into the skin 3 (three) times daily with meals. Take 10 units in the morning, 45 units at lunch, 25 units at dinner. Patient taking differently: Inject 5-50 Units into the skin 3 (three) times daily with meals. Take 25  units in the morning, 50 units at lunch, 25 units at dinner. 03/06/22   Rodolph Bong, MD  LORazepam (ATIVAN) 1 MG tablet Take 0.5 tablets (0.5 mg total) by mouth daily as needed. 05/23/23   Plotnikov, Georgina Quint, MD  losartan (COZAAR) 50 MG tablet Take 1 tablet (50 mg total) by mouth daily. 05/23/23   Plotnikov, Georgina Quint, MD  Magnesium 250 MG TABS Take 1 tablet by mouth daily.    [provider]  meclizine (ANTIVERT) 12.5 MG tablet Take 1 tablet (12.5 mg total) by mouth 3 (three) times daily as needed for dizziness. 11/30/22 11/30/23  Plotnikov, Georgina Quint, MD  medroxyPROGESTERone Acetate 150 MG/ML SUSY Inject 1 syringe into the muscle every 11-12 weeks 05/25/22     medroxyPROGESTERone Acetate 150 MG/ML SUSY Inject 1 mL (150 mg total) into the muscle every 3 (three) months. (every 11 - 12 weeks) 06/09/23     metoprolol succinate (TOPROL-XL) 25 MG 24 hr tablet Take 1 tablet (25 mg total) by mouth daily. May take additional half tablet as needed for tachycardia, palpitations. 04/21/23   Alver Sorrow, NP  nystatin-triamcinolone (MYCOLOG II) cream APPLY TO THE AFFECTED AREA(S) TWO TIMES DAILY AS DIRECTED 09/25/21     promethazine (PHENERGAN) 25 MG tablet TAKE 1 TABLET BY MOUTH EVERY 8 HOURS 11/25/21     Riboflavin (VITAMIN B-2 PO) Take 1 tablet by mouth daily.    [provider]  rizatriptan (MAXALT) 10 MG tablet Take 1 tablet (10 mg total) by mouth once as needed for up to 1 dose for migraine (Migraine headaches). May repeat in 2 hours if needed 04/28/23   Windell Norfolk, MD  spironolactone (ALDACTONE) 25 MG tablet Take 0.5 tablets (12.5 mg total) by mouth daily. 05/23/23   Plotnikov, Georgina Quint, MD  SYRINGE-NEEDLE, DISP, 3 ML (B-D 3CC LUER-LOK SYR 25GX5/8") 25G X 5/8" 3 ML MISC Use to inject B12 under the skin 02/17/23   Plotnikov, Georgina Quint, MD  tirzepatide [facility]) 5 MG/0.5ML Pen Inject 5 mg into the skin once a week. 04/14/23     triamcinolone (KENALOG) 0.1 % paste Use as directed at  bedtime 08/02/23   Plotnikov, Georgina Quint, MD  valACYclovir (VALTREX) 500 MG tablet Take 1 tablet (500 mg total) by mouth 2 (two) times daily. 08/02/23   Plotnikov, Georgina Quint, MD  Zavegepant HCl (ZAVZPRET) 10 MG/ACT SOLN Place 1 spray into the nose once as needed for up to 1 dose. 05/23/23   Plotnikov, Georgina Quint, MD      Allergies    Metronidazole, Shellfish allergy, Empagliflozin-linagliptin, Lisinopril, Metformin hcl, Prednisone, Dulaglutide, Liraglutide, and Nurtec [rimegepant sulfate]    Review of Systems   Review of Systems  Gastrointestinal:  Positive for abdominal pain.  All other systems reviewed  and are negative.   Physical Exam Updated Vital Signs BP (!) 142/88   Pulse 91   Temp 97.8 F (36.6 C)   Resp 18   SpO2 99%  Physical Exam Vitals and nursing note reviewed.  Constitutional:      General: She is not in acute distress.    Appearance: Normal appearance.  HENT:     Head: Normocephalic and atraumatic.     Mouth/Throat:     Mouth: Mucous membranes are moist.  Eyes:     Conjunctiva/sclera: Conjunctivae normal.     Pupils: Pupils are equal, round, and reactive to light.  Cardiovascular:     Rate and Rhythm: Normal rate and regular rhythm.     Pulses: Normal pulses.     Heart sounds: Normal heart sounds.  Pulmonary:     Effort: Pulmonary effort is normal.     Breath sounds: Normal breath sounds.  Abdominal:     General: There is no distension.     Palpations: Abdomen is soft.     Tenderness: There is abdominal tenderness. There is no guarding or rebound.     Comments: Periumbilical tenderness to palpation without distention, rebound, or guarding  Skin:    General: Skin is warm and dry.     Findings: No rash.  Neurological:     General: No focal deficit present.     Mental Status: She is alert.  Psychiatric:        Mood and Affect: Mood normal.        Behavior: Behavior normal.    ED Results / Procedures / Treatments   Labs (all labs ordered are listed,  but only abnormal results are displayed) Labs Reviewed  COMPREHENSIVE METABOLIC PANEL - Abnormal; Notable for the following components:      Result Value   Glucose, Bld 119 (*)    AST 14 (*)    All other components within normal limits  CBC - Abnormal; Notable for the following components:   WBC 14.9 (*)    RBC 5.62 (*)    Hemoglobin 15.3 (*)    All other components within normal limits  LIPASE, BLOOD  HCG, QUANTITATIVE, PREGNANCY    EKG None  Radiology No results found.  Procedures Procedures: not indicated. {Document cardiac monitor, telemetry assessment procedure when appropriate:1}  Medications Ordered in ED Medications  iohexol (OMNIPAQUE) 300 MG/ML solution 100 mL (100 mLs Intravenous Contrast Given 09/15/23 2229)  ondansetron (ZOFRAN) injection 4 mg (4 mg Intravenous Given 09/15/23 2249)  morphine (PF) 4 MG/ML injection 4 mg (4 mg Intravenous Given 09/15/23 2249)    ED Course/ Medical Decision Making/ A&P   {   Click here for ABCD2, HEART and other calculatorsREFRESH Note before signing :1}                              Medical Decision Making Amount and/or Complexity of Data Reviewed Labs: ordered. Radiology: ordered.  Risk Prescription drug management.   This patient presents to the ED for concern of abdominal pain, this involves an extensive number of treatment options, and is a complaint that carries with it a high risk of complications and morbidity.   Differential diagnosis includes: Crohn's flare, IBS, gastritis, gastroenteritis, diverticulitis, fistula, abscess, bowel obstruction, bowel perforation, etc.   Comorbidities  See HPI above   Additional History  Additional history obtained from prior records.   Lab Tests  I ordered and personally interpreted labs.  The pertinent results include:   Elevated WBC of 14.9, otherwise CBC and CMP are within normal limits - no electrolyte derangement, AKI, or acute anemia Negative pregnancy test Lipase  is unremarkable   Imaging Studies  I ordered imaging studies including CT abdomen/pelvis  I independently visualized and interpreted imaging which showed: *** I agree with the radiologist interpretation   Consultations  I requested consultation with ***,  and discussed lab and imaging findings as well as pertinent plan - they recommend: ***   Problem List / ED Course / Critical Interventions / Medication Management  Abdominal pain I ordered medications including: Morphine and Zofran for pain  Reevaluation of the patient after these medicines showed that the patient {resolved/improved/worsened:23923::"improved"} I have reviewed the patients home medicines and have made adjustments as needed   Social Determinants of Health  Physical activity   Test / Admission - Considered  ***   {Document critical care time when appropriate:1} {Document review of labs and clinical decision tools ie heart score, Chads2Vasc2 etc:1}  {Document your independent review of radiology images, and any outside records:1} {Document your discussion with family members, caretakers, and with consultants:1} {Document social determinants of health affecting pt's care:1} {Document your decision making why or why not admission, treatments were needed:1} Final Clinical Impression(s) / ED Diagnoses Final diagnoses:  None    Rx / DC Orders ED Discharge Orders     None

## 2023-09-15 NOTE — ED Provider Notes (Signed)
[place] EMERGENCY DEPARTMENT AT [facility] Provider Note   CSN: [id number] Arrival date & time: 09/15/23  1823     History  Chief Complaint  Patient presents with   Abdominal Pain    EUVA MEO is a 47 y.o. female with a history of Crohn's disease, diabetes mellitus, and hypertension who presents the ED today for abdominal pain.  Patient reports that she does have been having pain in her stomach for the past 6 weeks, but has become diffuse today.  She ate breakfast at [employer] with her daughter around 53 AM and reports feeling "okay" after. Since 1 PM, patient reports multiple episodes of diarrhea and emesis.  She does not know whether or not there was blood in it.  Denies fevers or recent sick contact.  She states that she has infusions every 8 weeks for her Crohn's disease, last infusion was in October. Denies any additional complaints or concerns at this time.    Home Medications Prior to Admission medications   Medication Sig Start Date End Date Taking? Authorizing Provider  acetaminophen (TYLENOL) 500 MG tablet Take 1,000 mg by mouth every 6 (six) hours as needed for moderate pain or headache.    [provider]  albuterol (VENTOLIN HFA) 108 (90 Base) MCG/ACT inhaler Inhale 1-2 puffs into the lungs every 6 (six) hours as needed for wheezing or shortness of breath. 12/21/22   Pincus Sanes, MD  aspirin EC 81 MG tablet Take 1 tablet (81 mg total) by mouth daily. Swallow whole. 01/17/22   Gaston Islam., NP  atorvastatin (LIPITOR) 20 MG tablet Take 1 tablet (20 mg total) by mouth daily. 04/21/23 07/23/23  Alver Sorrow, NP  Bacillus Coagulans-Inulin (PROBIOTIC) 1-250 BILLION-MG CAPS Take 1 capsule by mouth daily.    [provider]  budesonide (ENTOCORT EC) 3 MG 24 hr capsule Take 3 capsules by mouth for 4 weeks, then 2 capsules for 2 weeks, then 1 capsule for 2 weeks, then stop 08/18/23     Continuous Glucose Sensor (DEXCOM G7 SENSOR) MISC Use  every 10 days as directed three times daily 05/24/23     dapagliflozin propanediol (FARXIGA) 10 MG TABS tablet Take 1 tablet (10 mg total) by mouth daily. 05/23/23   Plotnikov, Georgina Quint, MD  escitalopram (LEXAPRO) 20 MG tablet Take 2 tablets (40 mg total) by mouth daily. 05/23/23   Plotnikov, Georgina Quint, MD  fluconazole (DIFLUCAN) 150 MG tablet Take 1 tablet by mouth daily once as needed yeast infection--this is a prescription for 3 courses as needed 05/04/23   Plotnikov, Georgina Quint, MD  gabapentin (NEURONTIN) 300 MG capsule Take 1 capsule (300 mg total) by mouth at bedtime. 04/28/23 11/02/23  Windell Norfolk, MD  hyoscyamine (LEVBID) 0.375 MG 12 hr tablet Take 1 tablet  by mouth as needed as directed (30 days). 11/25/21   Kerin Salen, MD  hyoscyamine (LEVBID) 0.375 MG 12 hr tablet Take 1 tablet (0.375 mg total) by mouth every 12 (twelve) hours. 08/04/23     inFLIXimab (REMICADE IV) Inject into the vein every 8 (eight) weeks. Pt. Is unsure of dosing, but goes to have this done every 8 weeks    [provider]  Insulin Disposable Pump (OMNIPOD 5 G6 INTRO, GEN 5,) KIT Change pod every 2 days as directed (Total daily dose of 100 units) 12/14/22     Insulin Disposable Pump (OMNIPOD 5 G6 PODS, GEN 5,) MISC Change pod every 2 days as directed (TDD of 100  units) 12/14/22     insulin lispro (HUMALOG) 100 UNIT/ML injection Up to 200 units per day/via insulin pump Injection 30 days 03/17/23     Insulin Pen Needle 32G X 4 MM MISC by Does not apply route. BD U/F Pen Needles Nano 4 mm x 32 G; use with insulin    [provider]  insulin regular human CONCENTRATED (HUMULIN R U-500 KWIKPEN) 500 UNIT/ML KwikPen Inject 5-50 Units into the skin 3 (three) times daily with meals. Take 10 units in the morning, 45 units at lunch, 25 units at dinner. Patient taking differently: Inject 5-50 Units into the skin 3 (three) times daily with meals. Take 25 units in the morning, 50 units at lunch, 25 units at dinner. 03/06/22    Rodolph Bong, MD  LORazepam (ATIVAN) 1 MG tablet Take 0.5 tablets (0.5 mg total) by mouth daily as needed. 05/23/23   Plotnikov, Georgina Quint, MD  losartan (COZAAR) 50 MG tablet Take 1 tablet (50 mg total) by mouth daily. 05/23/23   Plotnikov, Georgina Quint, MD  Magnesium 250 MG TABS Take 1 tablet by mouth daily.    [provider]  meclizine (ANTIVERT) 12.5 MG tablet Take 1 tablet (12.5 mg total) by mouth 3 (three) times daily as needed for dizziness. 11/30/22 11/30/23  Plotnikov, Georgina Quint, MD  medroxyPROGESTERone Acetate 150 MG/ML SUSY Inject 1 syringe into the muscle every 11-12 weeks 05/25/22     medroxyPROGESTERone Acetate 150 MG/ML SUSY Inject 1 mL (150 mg total) into the muscle every 3 (three) months. (every 11 - 12 weeks) 06/09/23     metoprolol succinate (TOPROL-XL) 25 MG 24 hr tablet Take 1 tablet (25 mg total) by mouth daily. May take additional half tablet as needed for tachycardia, palpitations. 04/21/23   Alver Sorrow, NP  nystatin-triamcinolone (MYCOLOG II) cream APPLY TO THE AFFECTED AREA(S) TWO TIMES DAILY AS DIRECTED 09/25/21     promethazine (PHENERGAN) 25 MG tablet TAKE 1 TABLET BY MOUTH EVERY 8 HOURS 11/25/21     Riboflavin (VITAMIN B-2 PO) Take 1 tablet by mouth daily.    [provider]  rizatriptan (MAXALT) 10 MG tablet Take 1 tablet (10 mg total) by mouth once as needed for up to 1 dose for migraine (Migraine headaches). May repeat in 2 hours if needed 04/28/23   Windell Norfolk, MD  spironolactone (ALDACTONE) 25 MG tablet Take 0.5 tablets (12.5 mg total) by mouth daily. 05/23/23   Plotnikov, Georgina Quint, MD  SYRINGE-NEEDLE, DISP, 3 ML (B-D 3CC LUER-LOK SYR 25GX5/8") 25G X 5/8" 3 ML MISC Use to inject B12 under the skin 02/17/23   Plotnikov, Georgina Quint, MD  tirzepatide [facility]) 5 MG/0.5ML Pen Inject 5 mg into the skin once a week. 04/14/23     triamcinolone (KENALOG) 0.1 % paste Use as directed at bedtime 08/02/23   Plotnikov, Georgina Quint, MD  valACYclovir (VALTREX) 500  MG tablet Take 1 tablet (500 mg total) by mouth 2 (two) times daily. 08/02/23   Plotnikov, Georgina Quint, MD  Zavegepant HCl (ZAVZPRET) 10 MG/ACT SOLN Place 1 spray into the nose once as needed for up to 1 dose. 05/23/23   Plotnikov, Georgina Quint, MD      Allergies    Metronidazole, Shellfish allergy, Empagliflozin-linagliptin, Lisinopril, Metformin hcl, Prednisone, Dulaglutide, Liraglutide, and Nurtec [rimegepant sulfate]    Review of Systems   Review of Systems  Gastrointestinal:  Positive for abdominal pain.  All other systems reviewed and are negative.   Physical Exam Updated Vital  Signs BP (!) 142/88   Pulse 91   Temp 97.8 F (36.6 C)   Resp 18   SpO2 99%  Physical Exam Vitals and nursing note reviewed.  Constitutional:      General: She is not in acute distress.    Appearance: Normal appearance.  HENT:     Head: Normocephalic and atraumatic.     Mouth/Throat:     Mouth: Mucous membranes are moist.  Eyes:     Conjunctiva/sclera: Conjunctivae normal.     Pupils: Pupils are equal, round, and reactive to light.  Cardiovascular:     Rate and Rhythm: Normal rate and regular rhythm.     Pulses: Normal pulses.     Heart sounds: Normal heart sounds.  Pulmonary:     Effort: Pulmonary effort is normal.     Breath sounds: Normal breath sounds.  Abdominal:     General: There is no distension.     Palpations: Abdomen is soft.     Tenderness: There is abdominal tenderness. There is no guarding or rebound.     Comments: Periumbilical tenderness to palpation without distention, rebound, or guarding  Skin:    General: Skin is warm and dry.     Findings: No rash.  Neurological:     General: No focal deficit present.     Mental Status: She is alert.  Psychiatric:        Mood and Affect: Mood normal.        Behavior: Behavior normal.    ED Results / Procedures / Treatments   Labs (all labs ordered are listed, but only abnormal results are displayed) Labs Reviewed  COMPREHENSIVE  METABOLIC PANEL - Abnormal; Notable for the following components:      Result Value   Glucose, Bld 119 (*)    AST 14 (*)    All other components within normal limits  CBC - Abnormal; Notable for the following components:   WBC 14.9 (*)    RBC 5.62 (*)    Hemoglobin 15.3 (*)    All other components within normal limits  LIPASE, BLOOD  HCG, QUANTITATIVE, PREGNANCY    EKG None  Radiology No results found.  Procedures Procedures: not indicated.   Medications Ordered in ED Medications  iohexol (OMNIPAQUE) 300 MG/ML solution 100 mL (100 mLs Intravenous Contrast Given 09/15/23 2229)  ondansetron (ZOFRAN) injection 4 mg (4 mg Intravenous Given 09/15/23 2249)  morphine (PF) 4 MG/ML injection 4 mg (4 mg Intravenous Given 09/15/23 2249)    ED Course/ Medical Decision Making/ A&P                                 Medical Decision Making Amount and/or Complexity of Data Reviewed Labs: ordered. Radiology: ordered.  Risk Prescription drug management.   This patient presents to the ED for concern of abdominal pain, this involves an extensive number of treatment options, and is a complaint that carries with it a high risk of complications and morbidity.   Differential diagnosis includes: Crohn's flare, IBS, gastritis, gastroenteritis, diverticulitis, fistula, abscess, bowel obstruction, bowel perforation, etc.   Comorbidities  See HPI above   Additional History  Additional history obtained from prior records.   Lab Tests  I ordered and personally interpreted labs.  The pertinent results include:   Elevated WBC of 14.9, otherwise CBC and CMP are within normal limits - no electrolyte derangement, AKI, or acute anemia Negative pregnancy test Lipase  is unremarkable   Imaging Studies  I ordered imaging studies including CT abdomen/pelvis  I independently visualized and interpreted imaging which showed: pending at shift change. I agree with the radiologist  interpretation   Problem List / ED Course / Critical Interventions / Medication Management  Abdominal pain I ordered medications including: Morphine and Zofran for pain  Reevaluation of the patient after these medicines showed that the patient improved I have reviewed the patients home medicines and have made adjustments as needed   Social Determinants of Health  Physical activity   Test / Admission - Considered  Disposition pending image results.        Final Clinical Impression(s) / ED Diagnoses Final diagnoses:  None    Rx / DC Orders ED Discharge Orders     None         Maxwell Marion, PA-C 09/16/23 0040    Coral Spikes, DO 09/20/23 1554

## 2023-09-15 NOTE — Assessment & Plan Note (Signed)
Hopeful that this will make some difference.  May need to do the viscosupplementation again.  Patient will monitor blood sugars closer.  Discussed which activities to do and which ones to avoid.  Discussed avoiding certain activities.  Increase activity slowly otherwise.  Continue to monitor weight.  Will follow-up again in 6 to 8 weeks.

## 2023-09-15 NOTE — ED Triage Notes (Signed)
"  Stomach issues x 6 weeks" Hx crohns and UC Ate breakfast and felt ok, then vomiting and diarrhea Started 1pm Diarrhea x 5 since onset

## 2023-09-15 NOTE — Patient Instructions (Signed)
Good to see you! Injection in knees today See you again when you need [place]

## 2023-09-15 NOTE — Progress Notes (Signed)
[facility] [address] [place] [address] Phone: [phone] Subjective:   Cindy Long, am serving as a scribe for Dr. Antoine Primas.  I'm seeing this patient by the request  of:  Plotnikov, Georgina Quint, MD  CC: Bilateral knee pain  BJY:[id number]  12/02/2022 Patient has very mild arthritic changes but has responded extremely well to viscosupplementation previously and is having worsening discomfort at the moment.  Discussed with patient about icing regimen and home exercises.  Discussed because patient does have some muscle atrophy of the lower extremities that hopefully this will help with decreasing progression secondary to strain on the joint itself.  Discussed with patient about continuing to be active.  Can repeat this every 6 months if necessary but at the moment can follow-up almost on an as-needed basis social determinant of health includes patient's diabetes has been complicated as well for her.     09/15/2023 Cindy Long is a 47 y.o. female coming in with complaint of B knee pain. Patient states that she is here for injections in both knees. R>L.        Past Medical History:  Diagnosis Date   Chest pressure 11/18/2021   Crohn disease (HCC)    Diabetes mellitus    IDDM, Type 2   Essential hypertension 11/18/2021   GERD (gastroesophageal reflux disease)    no meds currently   High cholesterol    Hypertension    Mastitis    right breast   Postpartum care following cesarean delivery (2/10) 12/17/2013   Pure hypercholesterolemia 11/18/2021   Past Surgical History:  Procedure Laterality Date   CERVICAL CERCLAGE     CERVICAL CERCLAGE N/A 06/21/2013   Procedure: McDonald CERCLAGE CERVICAL;  Surgeon: Serita Kyle, MD;  Location: [facility];  Service: Gynecology;  Laterality: N/A;   CESAREAN SECTION  2008   CESAREAN SECTION N/A 12/17/2013   Procedure: Repeat CESAREAN SECTION with Cerclage Removal;  Surgeon: Serita Kyle, MD;  Location: [facility];  Service: Obstetrics;  Laterality: N/A;  EDD: 12/22/13   CHOLECYSTECTOMY     CHOLECYSTECTOMY OPEN  08/08/2011   GANGLION CYST EXCISION Right 1997   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   Social History   Socioeconomic History   Marital status: Married    Spouse name: jermaine   Number of children: 2   Years of education: Not on file   Highest education level: Some college, no degree  Occupational History   Not on file  Tobacco Use   Smoking status: Never    Passive exposure: Never   Smokeless tobacco: Never  Vaping Use   Vaping status: Never Used  Substance and Sexual Activity   Alcohol use: Yes    Alcohol/week: 3.0 standard drinks of alcohol    Types: 3 Standard drinks or equivalent per week    Comment: occasionally   Drug use: No   Sexual activity: Yes    Birth control/protection: Injection  Other Topics Concern   Not on file  Social History Narrative   LPN   Social Determinants of Health   Financial Resource Strain: Low Risk  (11/18/2021)   Overall Financial Resource Strain (CARDIA)    Difficulty of Paying Living Expenses: Not hard at all  Food Insecurity: No Food Insecurity (11/18/2021)   Hunger Vital Sign    Worried About Running Out of Food in the Last Year: Never true    Ran Out of Food in the Last Year: Never true  Transportation Needs: No Transportation Needs (11/18/2021)   PRAPARE - Administrator, Civil Service (Medical): No    Lack of Transportation (Non-Medical): No  Physical Activity: Inactive (11/18/2021)   Exercise Vital Sign    Days of Exercise per Week: 0 days    Minutes of Exercise per Session: 0 min  Stress: Not on file  Social Connections: Unknown (03/07/2022)   Received from [facility]   Social Network    Social Network: Not on file   Allergies  Allergen Reactions   Metronidazole Swelling    Throat swelling   Shellfish Allergy Swelling    Swelling of the throat    Empagliflozin-Linagliptin Other (See Comments)    headaches   Lisinopril Other (See Comments)   Metformin Hcl Other (See Comments)   Prednisone Other (See Comments)    Palpitations w/high dose   Dulaglutide Rash   Liraglutide Rash   Nurtec [Rimegepant Sulfate] Rash    she did not like how it made her feel   Family History  Problem Relation Age of Onset   Hypertension Mother    Hypercholesterolemia Mother    Other Sister        brain tumor   Headache Sister    Diabetes Maternal Aunt    Hypertension Maternal Grandmother    Cancer Maternal Grandmother        breast, colon   Diabetes Maternal Grandmother    Headache Maternal Grandmother    Hypertension Maternal Grandfather    Healthy Daughter    Healthy Daughter    Heart attack Cousin 75    Current Outpatient Medications (Endocrine & Metabolic):    budesonide (ENTOCORT EC) 3 MG 24 hr capsule, Take 3 capsules by mouth for 4 weeks, then 2 capsules for 2 weeks, then 1 capsule for 2 weeks, then stop   dapagliflozin propanediol (FARXIGA) 10 MG TABS tablet, Take 1 tablet (10 mg total) by mouth daily.   insulin lispro (HUMALOG) 100 UNIT/ML injection, Up to 200 units per day/via insulin pump Injection 30 days   insulin regular human CONCENTRATED (HUMULIN R U-500 KWIKPEN) 500 UNIT/ML KwikPen, Inject 5-50 Units into the skin 3 (three) times daily with meals. Take 10 units in the morning, 45 units at lunch, 25 units at dinner. (Patient taking differently: Inject 5-50 Units into the skin 3 (three) times daily with meals. Take 25 units in the morning, 50 units at lunch, 25 units at dinner.)   medroxyPROGESTERone Acetate 150 MG/ML SUSY, Inject 1 syringe into the muscle every 11-12 weeks   medroxyPROGESTERone Acetate 150 MG/ML SUSY, Inject 1 mL (150 mg total) into the muscle every 3 (three) months. (every 11 - 12 weeks)   tirzepatide (MOUNJARO) 5 MG/0.5ML Pen, Inject 5 mg into the skin once a week.  Current Outpatient Medications  (Cardiovascular):    losartan (COZAAR) 50 MG tablet, Take 1 tablet (50 mg total) by mouth daily.   metoprolol succinate (TOPROL-XL) 25 MG 24 hr tablet, Take 1 tablet (25 mg total) by mouth daily. May take additional half tablet as needed for tachycardia, palpitations.   spironolactone (ALDACTONE) 25 MG tablet, Take 0.5 tablets (12.5 mg total) by mouth daily.   atorvastatin (LIPITOR) 20 MG tablet, Take 1 tablet (20 mg total) by mouth daily.  Current Outpatient Medications (Respiratory):    albuterol (VENTOLIN HFA) 108 (90 Base) MCG/ACT inhaler, Inhale 1-2 puffs into the lungs every 6 (six) hours as needed for wheezing or shortness of breath.   promethazine (PHENERGAN)  25 MG tablet, TAKE 1 TABLET BY MOUTH EVERY 8 HOURS  Current Outpatient Medications (Analgesics):    acetaminophen (TYLENOL) 500 MG tablet, Take 1,000 mg by mouth every 6 (six) hours as needed for moderate pain or headache.   aspirin EC 81 MG tablet, Take 1 tablet (81 mg total) by mouth daily. Swallow whole.   rizatriptan (MAXALT) 10 MG tablet, Take 1 tablet (10 mg total) by mouth once as needed for up to 1 dose for migraine (Migraine headaches). May repeat in 2 hours if needed   Zavegepant HCl (ZAVZPRET) 10 MG/ACT SOLN, Place 1 spray into the nose once as needed for up to 1 dose.   Current Outpatient Medications (Other):    Bacillus Coagulans-Inulin (PROBIOTIC) 1-250 BILLION-MG CAPS, Take 1 capsule by mouth daily.   Continuous Glucose Sensor (DEXCOM G7 SENSOR) MISC, Use every 10 days as directed three times daily   escitalopram (LEXAPRO) 20 MG tablet, Take 2 tablets (40 mg total) by mouth daily.   fluconazole (DIFLUCAN) 150 MG tablet, Take 1 tablet by mouth daily once as needed yeast infection--this is a prescription for 3 courses as needed   gabapentin (NEURONTIN) 300 MG capsule, Take 1 capsule (300 mg total) by mouth at bedtime.   hyoscyamine (LEVBID) 0.375 MG 12 hr tablet, Take 1 tablet  by mouth as needed as directed (30  days).   hyoscyamine (LEVBID) 0.375 MG 12 hr tablet, Take 1 tablet (0.375 mg total) by mouth every 12 (twelve) hours.   inFLIXimab (REMICADE IV), Inject into the vein every 8 (eight) weeks. Pt. Is unsure of dosing, but goes to have this done every 8 weeks   Insulin Disposable Pump (OMNIPOD 5 G6 INTRO, GEN 5,) KIT, Change pod every 2 days as directed (Total daily dose of 100 units)   Insulin Disposable Pump (OMNIPOD 5 G6 PODS, GEN 5,) MISC, Change pod every 2 days as directed (TDD of 100 units)   Insulin Pen Needle 32G X 4 MM MISC, by Does not apply route. BD U/F Pen Needles Nano 4 mm x 32 G; use with insulin   LORazepam (ATIVAN) 1 MG tablet, Take 0.5 tablets (0.5 mg total) by mouth daily as needed.   Magnesium 250 MG TABS, Take 1 tablet by mouth daily.   meclizine (ANTIVERT) 12.5 MG tablet, Take 1 tablet (12.5 mg total) by mouth 3 (three) times daily as needed for dizziness.   nystatin-triamcinolone (MYCOLOG II) cream, APPLY TO THE AFFECTED AREA(S) TWO TIMES DAILY AS DIRECTED   Riboflavin (VITAMIN B-2 PO), Take 1 tablet by mouth daily.   SYRINGE-NEEDLE, DISP, 3 ML (B-D 3CC LUER-LOK SYR 25GX5/8") 25G X 5/8" 3 ML MISC, Use to inject B12 under the skin   triamcinolone (KENALOG) 0.1 % paste, Use as directed at bedtime   valACYclovir (VALTREX) 500 MG tablet, Take 1 tablet (500 mg total) by mouth 2 (two) times daily.   Reviewed prior external information including notes and imaging from  primary care provider As well as notes that were available from care everywhere and other healthcare systems.  Past medical history, social, surgical and family history all reviewed in electronic medical record.  No pertanent information unless stated regarding to the chief complaint.   Review of Systems:  No headache, visual changes, nausea, vomiting, diarrhea, constipation, dizziness, abdominal pain, skin rash, fevers, chills, night sweats, weight loss, swollen lymph nodes, body aches, joint swelling, chest pain,  shortness of breath, mood changes. POSITIVE muscle aches  Objective  Blood pressure 114/84, pulse 96,  height 5\' 6"  (1.676 m), weight 170 lb (77.1 kg), SpO2 97%.   General: No apparent distress alert and oriented x3 mood and affect normal, dressed appropriately.  HEENT: Pupils equal, extraocular movements intact  Respiratory: Patient's speak in full sentences and does not appear short of breath  Cardiovascular: No lower extremity edema, non tender, no erythema  Crepitus noted of the knees bilaterally.  Patient does have some instability with valgus and varus force.  Patient does have lacking the last 5 degrees of flexion.  After informed written and verbal consent, patient was seated on exam table. Right knee was prepped with alcohol swab and utilizing anterolateral approach, patient's right knee space was injected with 4:1  marcaine 0.5%: Kenalog 40mg /dL. Patient tolerated the procedure well without immediate complications.  After informed written and verbal consent, patient was seated on exam table. Left knee was prepped with alcohol swab and utilizing anterolateral approach, patient's left knee space was injected with 4:1  marcaine 0.5%: Kenalog 40mg /dL. Patient tolerated the procedure well without immediate complications.    Impression and Recommendations:    The above documentation has been reviewed and is accurate and complete Judi Saa, DO

## 2023-09-16 ENCOUNTER — Other Ambulatory Visit (HOSPITAL_COMMUNITY): Payer: Self-pay

## 2023-09-16 MED ORDER — OXYCODONE-ACETAMINOPHEN 5-325 MG PO TABS
1.0000 | ORAL_TABLET | Freq: Three times a day (TID) | ORAL | 0 refills | Status: DC | PRN
  Filled 2023-09-16: qty 10, 4d supply, fill #0

## 2023-09-16 NOTE — ED Provider Notes (Signed)
2:43 AM Assumed care from Maxwell Marion and Dr. Maple Hudson, please see their note for full history, physical and decision making until this point. In brief this is a 47 y.o. year old female who presented to the ED tonight with Abdominal Pain     History of Crohn's here with abdominal pain concerning for flare.  Pain significantly improved pending CT scan results for disposition.  CT scan done and interpreted by myself without any obvious abnormalities pending radiology read.  Delayed radiology read.  Ultimately radiology read returned without any abnormal findings.  Patient still well-controlled with her pain.  She is tolerating p.o.  She will follow-up with her GI doctor as needed return here for any new or worsening symptoms.  Otherwise stable for discharge at this time.  Short course of pain medications provided in case it comes back.  Discharge instructions, including strict return precautions for new or worsening symptoms, given. Patient and/or family verbalized understanding and agreement with the plan as described.   Labs, studies and imaging reviewed by myself and considered in medical decision making if ordered. Imaging interpreted by radiology.  Labs Reviewed  COMPREHENSIVE METABOLIC PANEL - Abnormal; Notable for the following components:      Result Value   Glucose, Bld 119 (*)    AST 14 (*)    All other components within normal limits  CBC - Abnormal; Notable for the following components:   WBC 14.9 (*)    RBC 5.62 (*)    Hemoglobin 15.3 (*)    All other components within normal limits  LIPASE, BLOOD  HCG, QUANTITATIVE, PREGNANCY    CT ABDOMEN PELVIS W CONTRAST  Final Result      No follow-ups on file.    Agueda Houpt, Barbara Cower, MD 09/17/23 Earle Gell

## 2023-09-16 NOTE — ED Notes (Signed)
Reviewed AVS with patient, patient expressed understanding of directions, denies further questions at this time. 

## 2023-09-18 ENCOUNTER — Other Ambulatory Visit (HOSPITAL_COMMUNITY): Payer: Self-pay

## 2023-09-18 ENCOUNTER — Other Ambulatory Visit: Payer: Self-pay

## 2023-09-26 ENCOUNTER — Other Ambulatory Visit (HOSPITAL_COMMUNITY): Payer: Self-pay

## 2023-09-29 DIAGNOSIS — I2510 Atherosclerotic heart disease of native coronary artery without angina pectoris: Secondary | ICD-10-CM | POA: Diagnosis not present

## 2023-09-29 DIAGNOSIS — I10 Essential (primary) hypertension: Secondary | ICD-10-CM | POA: Diagnosis not present

## 2023-09-29 DIAGNOSIS — E1165 Type 2 diabetes mellitus with hyperglycemia: Secondary | ICD-10-CM | POA: Diagnosis not present

## 2023-09-29 DIAGNOSIS — Z794 Long term (current) use of insulin: Secondary | ICD-10-CM | POA: Diagnosis not present

## 2023-09-29 DIAGNOSIS — R197 Diarrhea, unspecified: Secondary | ICD-10-CM | POA: Diagnosis not present

## 2023-10-08 ENCOUNTER — Other Ambulatory Visit (HOSPITAL_COMMUNITY): Payer: Self-pay

## 2023-10-09 ENCOUNTER — Other Ambulatory Visit (HOSPITAL_COMMUNITY): Payer: Self-pay

## 2023-10-09 ENCOUNTER — Other Ambulatory Visit: Payer: Self-pay

## 2023-10-16 ENCOUNTER — Other Ambulatory Visit: Payer: Self-pay

## 2023-10-20 ENCOUNTER — Ambulatory Visit (INDEPENDENT_AMBULATORY_CARE_PROVIDER_SITE_OTHER): Payer: Commercial Managed Care - PPO

## 2023-10-20 VITALS — BP 117/75 | HR 88 | Temp 98.6°F | Resp 16 | Ht 66.0 in | Wt 165.6 lb

## 2023-10-20 DIAGNOSIS — K50919 Crohn's disease, unspecified, with unspecified complications: Secondary | ICD-10-CM

## 2023-10-20 DIAGNOSIS — Z8719 Personal history of other diseases of the digestive system: Secondary | ICD-10-CM | POA: Diagnosis not present

## 2023-10-20 MED ORDER — METHYLPREDNISOLONE SODIUM SUCC 40 MG IJ SOLR
40.0000 mg | Freq: Once | INTRAMUSCULAR | Status: AC
  Administered 2023-10-20: 40 mg via INTRAVENOUS
  Filled 2023-10-20: qty 1

## 2023-10-20 MED ORDER — SODIUM CHLORIDE 0.9 % IV SOLN
5.0000 mg/kg | Freq: Once | INTRAVENOUS | Status: AC
  Administered 2023-10-20: 400 mg via INTRAVENOUS
  Filled 2023-10-20: qty 40

## 2023-10-20 MED ORDER — ACETAMINOPHEN 325 MG PO TABS: 650.0000 mg | ORAL_TABLET | Freq: Once | ORAL | Status: DC

## 2023-10-20 MED ORDER — DIPHENHYDRAMINE HCL 25 MG PO CAPS: 25.0000 mg | ORAL_CAPSULE | Freq: Once | ORAL | Status: DC

## 2023-10-20 NOTE — Progress Notes (Signed)
Diagnosis: Crohn's Disease  Provider:  Chilton Greathouse MD  Procedure: IV Infusion  IV Type: Peripheral, IV Location: L Antecubital  Avsola (infliximab-axxq), Dose: 400 mg  Infusion Start Time: 1104  Infusion Stop Time: 1315  Post Infusion IV Care: Peripheral IV Discontinued  Discharge: Condition: Good, Destination: Home . AVS Declined  Performed by:  Adriana Mccallum, RN

## 2023-10-29 IMAGING — CT CT HEART MORP W/ CTA COR W/ SCORE W/ CA W/CM &/OR W/O CM
4 of 7 series · 8 of 20 positions shown, 9 images · non-contrast
Comparison: 07/23/2017
COMPARISON: 07/23/2017

Addendum:
EXAM:
OVER-READ INTERPRETATION  CT CHEST

The following report is an over-read performed by radiologist Dr.
Finn-Erik Pilgaard [[email or web address]] on 11/29/2021. This over-read
does not include interpretation of cardiac or coronary anatomy or
pathology. The coronary CTA interpretation by the cardiologist is
attached.
CLINICAL DATA: Chest pain
Cardiac/Coronary CTA
TECHNIQUE: A non-contrast, gated CT scan was obtained with axial slices of 3 mm
through the heart for calcium scoring. Calcium scoring was performed
using the Agatston method. A 120 kV prospective, gated, contrast
cardiac scan was obtained. Gantry rotation speed was 250 msecs and
collimation was 0.6 mm. Two sublingual nitroglycerin tablets (0.8
mg) were given. The 3D data set was reconstructed in 5% intervals of
the 35-75% of the R-R cycle. Diastolic phases were analyzed on a
dedicated workstation using MPR, MIP, and VRT modes. The patient
received 95 cc of contrast.

[Series 6: best syst · axial · 0.39mm/px · z∈[-179,-142]mm · 2 of 281 slices shown, 3 images]
[im 94/281  vessel]
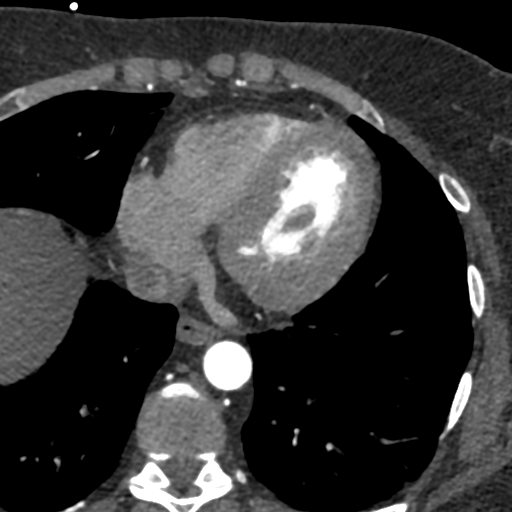
[im 94/281  lung]
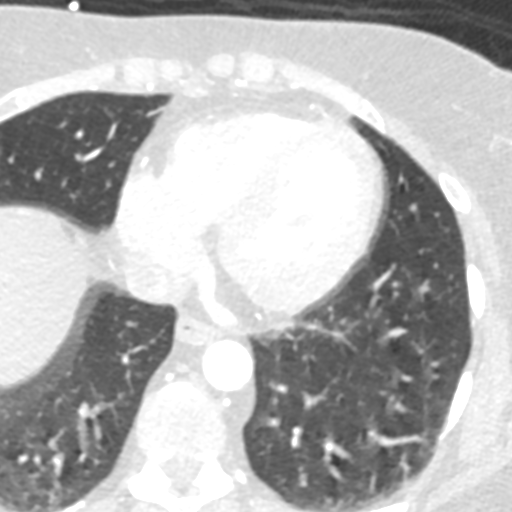
[im 187/281  vessel]
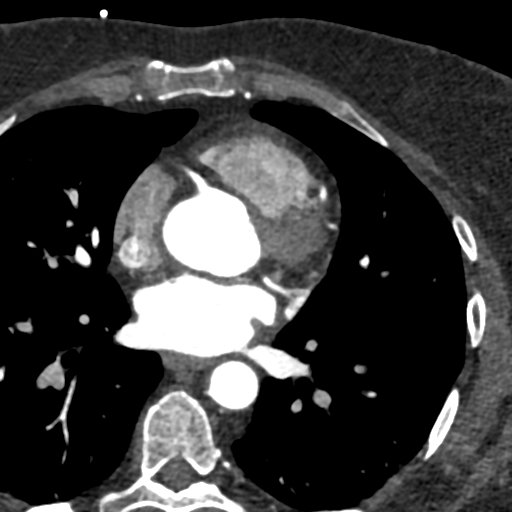

[Series 7: ts syst sharp · axial · 0.39mm/px · z∈[-179,-142]mm · 2 of 281 slices shown]
[im 94/281  lung]
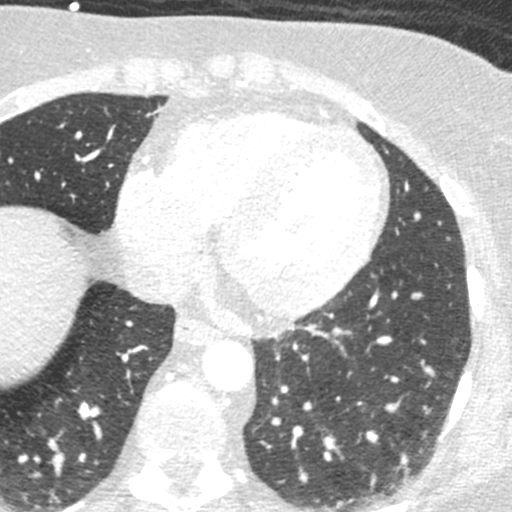
[im 187/281  lung]
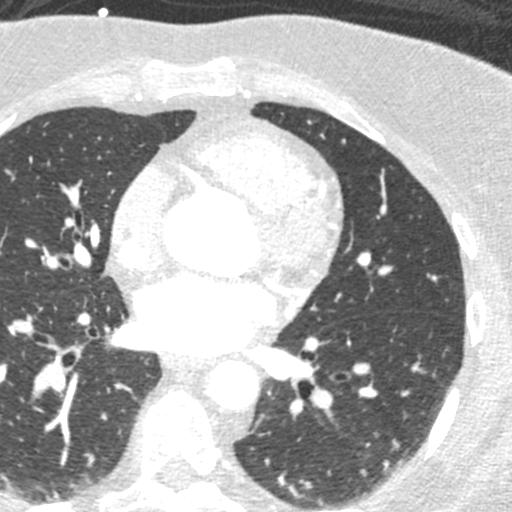

[Series 8: best diast · axial · 0.39mm/px · z∈[-179,-142]mm · 2 of 281 slices shown]
[im 94/281  vessel]
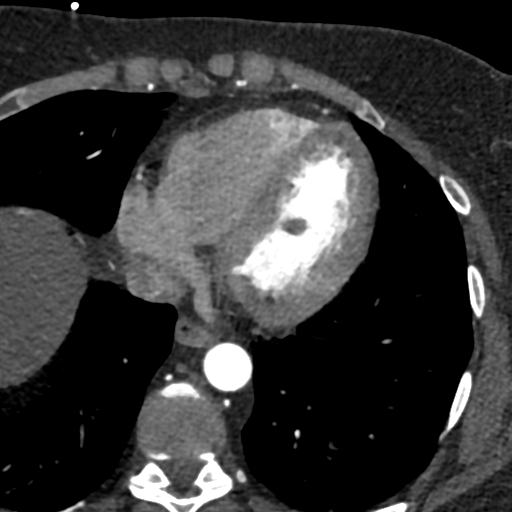
[im 187/281  vessel]
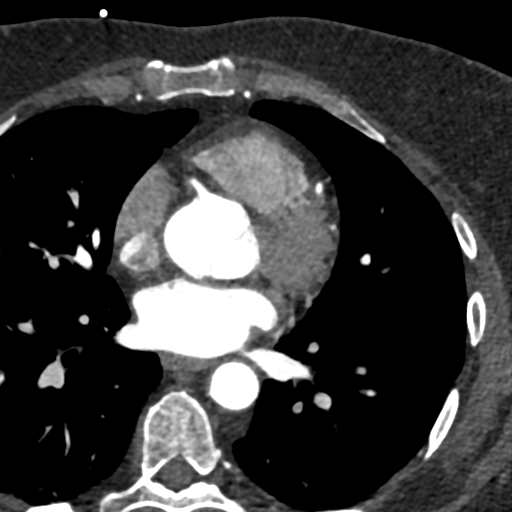

[Series 9: ts diast sharp · axial · 0.39mm/px · z∈[-179,-142]mm · 2 of 281 slices shown]
[im 94/281  lung]
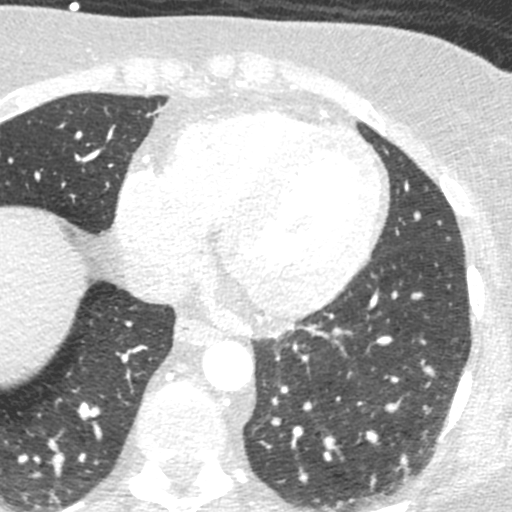
[im 187/281  lung]
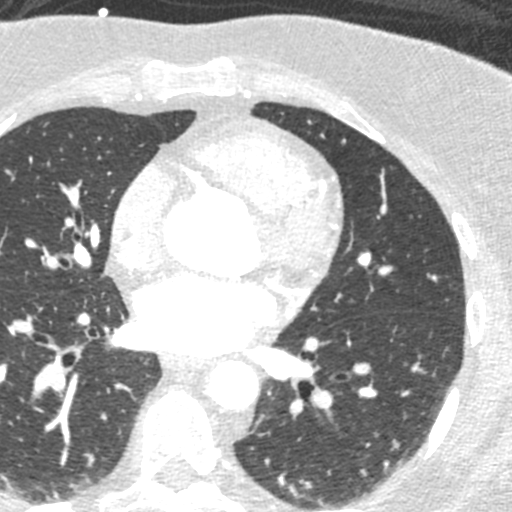

[8 of 20 positions shown; findings below may reference images not displayed]

FINDINGS: Vascular: Heart is normal size.  Aorta normal caliber.

Mediastinum/Nodes: No adenopathy

Lungs/Pleura: No confluent opacity or effusion.

Upper Abdomen: Imaging into the upper abdomen demonstrates no acute
findings.

Musculoskeletal: Chest wall soft tissues are unremarkable. No acute
bony abnormality.
IMPRESSION: No acute or significant extracardiac abnormality.
FINDINGS: Image quality: Excellent.

Noise artifact is: Limited.

Coronary Arteries:  Normal coronary origin.  Right dominance.

Left main: The left main is a large caliber vessel with a normal
take off from the left coronary cusp that bifurcates to form a left
anterior descending artery and a left circumflex artery. There is no
plaque or stenosis.

Left anterior descending artery: There is minimal mixed density
plaque in the proximal LAD (<25%). The mid and distal segments are
patent. The LAD gives off 1 patent diagonal branch.

Left circumflex artery: The LCX is non-dominant and patent with no
evidence of plaque or stenosis. The LCX gives off 2 patent obtuse
marginal branches.

Right coronary artery: The RCA is dominant with normal take off from
the right coronary cusp. There is no evidence of plaque or stenosis.
The RCA terminates as a PDA and right posterolateral branch without
evidence of plaque or stenosis.

Right Atrium: Right atrial size is within normal limits.

Right Ventricle: The right ventricular cavity is within normal
limits.

Left Atrium: Left atrial size is normal in size with no left atrial
appendage filling defect.

Left Ventricle: The ventricular cavity size is within normal limits.
There are no stigmata of prior infarction. A small diverticulum is
present in the basal inferior segment.

Pulmonary arteries: Normal in size without proximal filling defect.

Pulmonary veins: Normal pulmonary venous drainage.

Pericardium: Normal thickness with no significant effusion or
calcium present.

Cardiac valves: The aortic valve is trileaflet without significant
calcification. The mitral valve is normal structure without
significant calcification.

Aorta: Normal caliber with no significant disease.

Extra-cardiac findings: See attached radiology report for
non-cardiac structures.
IMPRESSION: 1. Coronary calcium score of 1.6. This was 92nd percentile for age-,
sex, and race-matched controls.

2. Normal coronary origin with right dominance.

3. Minimal mixed density plaque (<25%) in the proximal LAD.

4. Small diverticulum in the basal inferior segment. Suspect this is
benign finding.

RECOMMENDATIONS:
1. Minimal non-obstructive CAD (0-24%). Consider non-atherosclerotic
causes of chest pain. Consider preventive therapy and risk factor
modification.

*** End of Addendum ***
EXAM:
OVER-READ INTERPRETATION  CT CHEST

The following report is an over-read performed by radiologist Dr.
Finn-Erik Pilgaard [REDACTED] on 11/29/2021. This over-read
does not include interpretation of cardiac or coronary anatomy or
pathology. The coronary CTA interpretation by the cardiologist is
attached.
FINDINGS: Vascular: Heart is normal size.  Aorta normal caliber.

Mediastinum/Nodes: No adenopathy

Lungs/Pleura: No confluent opacity or effusion.

Upper Abdomen: Imaging into the upper abdomen demonstrates no acute
findings.

Musculoskeletal: Chest wall soft tissues are unremarkable. No acute
bony abnormality.
IMPRESSION: No acute or significant extracardiac abnormality.

## 2023-11-13 ENCOUNTER — Other Ambulatory Visit: Payer: Self-pay

## 2023-11-13 ENCOUNTER — Encounter (HOSPITAL_COMMUNITY): Payer: Self-pay

## 2023-11-13 ENCOUNTER — Emergency Department (HOSPITAL_COMMUNITY)
Admission: EM | Admit: 2023-11-13 | Discharge: 2023-11-14 | Disposition: A | Discharge status: Home / Self Care | Payer: Commercial Managed Care - PPO | Attending: Emergency Medicine | Admitting: Emergency Medicine

## 2023-11-13 DIAGNOSIS — Z7982 Long term (current) use of aspirin: Secondary | ICD-10-CM | POA: Diagnosis not present

## 2023-11-13 DIAGNOSIS — R197 Diarrhea, unspecified: Secondary | ICD-10-CM | POA: Insufficient documentation

## 2023-11-13 DIAGNOSIS — Z794 Long term (current) use of insulin: Secondary | ICD-10-CM | POA: Insufficient documentation

## 2023-11-13 DIAGNOSIS — R112 Nausea with vomiting, unspecified: Secondary | ICD-10-CM | POA: Insufficient documentation

## 2023-11-13 DIAGNOSIS — R519 Headache, unspecified: Secondary | ICD-10-CM | POA: Insufficient documentation

## 2023-11-13 DIAGNOSIS — E119 Type 2 diabetes mellitus without complications: Secondary | ICD-10-CM | POA: Insufficient documentation

## 2023-11-13 LAB — CBC
HCT: 44.5 % (ref 36.0–46.0)
Hemoglobin: 14.6 g/dL (ref 12.0–15.0)
MCH: 26.9 pg (ref 26.0–34.0)
MCHC: 32.8 g/dL (ref 30.0–36.0)
MCV: 82.1 fL (ref 80.0–100.0)
Platelets: 260 10*3/uL (ref 150–400)
RBC: 5.42 MIL/uL — ABNORMAL HIGH (ref 3.87–5.11)
RDW: 13.2 % (ref 11.5–15.5)
WBC: 9.1 10*3/uL (ref 4.0–10.5)
nRBC: 0 % (ref 0.0–0.2)

## 2023-11-13 LAB — COMPREHENSIVE METABOLIC PANEL
ALT: 25 U/L (ref 0–44)
AST: 16 U/L (ref 15–41)
Albumin: 3.6 g/dL (ref 3.5–5.0)
Alkaline Phosphatase: 63 U/L (ref 38–126)
Anion gap: 8 (ref 5–15)
BUN: 5 mg/dL — ABNORMAL LOW (ref 6–20)
CO2: 23 mmol/L (ref 22–32)
Calcium: 9.2 mg/dL (ref 8.9–10.3)
Chloride: 106 mmol/L (ref 98–111)
Creatinine, Ser: 0.47 mg/dL (ref 0.44–1.00)
GFR, Estimated: >60 mL/min (ref >60)
Glucose, Bld: 222 mg/dL — ABNORMAL HIGH (ref 70–99)
Potassium: 3.9 mmol/L (ref 3.5–5.1)
Sodium: 137 mmol/L (ref 135–145)
Total Bilirubin: 1 mg/dL (ref 0.0–1.2)
Total Protein: 6.9 g/dL (ref 6.5–8.1)

## 2023-11-13 LAB — URINALYSIS, ROUTINE W REFLEX MICROSCOPIC
Bilirubin Urine: NEGATIVE
Glucose, UA: >=500 mg/dL — AB
Hgb urine dipstick: NEGATIVE
Ketones, ur: 5 mg/dL — AB
Leukocytes,Ua: NEGATIVE
Nitrite: NEGATIVE
Protein, ur: 30 mg/dL — AB
Specific Gravity, Urine: 1.02 (ref 1.005–1.030)
pH: 5 (ref 5.0–8.0)

## 2023-11-13 LAB — PREGNANCY, URINE: Preg Test, Ur: NEGATIVE

## 2023-11-13 LAB — LIPASE, BLOOD: Lipase: 35 U/L (ref 11–51)

## 2023-11-13 MED ORDER — KETOROLAC TROMETHAMINE 30 MG/ML IJ SOLN
30.0000 mg | Freq: Once | INTRAMUSCULAR | Status: AC
  Administered 2023-11-13: 30 mg via INTRAVENOUS
  Filled 2023-11-13: qty 1

## 2023-11-13 MED ORDER — NAPROXEN 375 MG PO TABS
375.0000 mg | ORAL_TABLET | Freq: Two times a day (BID) | ORAL | 0 refills | Status: DC
  Filled 2023-11-13 – 2023-11-14 (×3): qty 20, 10d supply, fill #0

## 2023-11-13 MED ORDER — DIPHENHYDRAMINE HCL 50 MG/ML IJ SOLN
12.5000 mg | Freq: Once | INTRAMUSCULAR | Status: AC
  Administered 2023-11-13: 12.5 mg via INTRAVENOUS
  Filled 2023-11-13: qty 1

## 2023-11-13 MED ORDER — PROCHLORPERAZINE EDISYLATE 10 MG/2ML IJ SOLN
10.0000 mg | Freq: Once | INTRAMUSCULAR | Status: AC
  Administered 2023-11-13: 10 mg via INTRAVENOUS
  Filled 2023-11-13: qty 2

## 2023-11-13 MED ORDER — SODIUM CHLORIDE 0.9 % IV BOLUS
1000.0000 mL | Freq: Once | INTRAVENOUS | Status: AC
Start: 2023-11-13 — End: 2023-11-14
  Administered 2023-11-13: 1000 mL via INTRAVENOUS

## 2023-11-13 MED ORDER — ONDANSETRON 4 MG PO TBDP
4.0000 mg | ORAL_TABLET | Freq: Once | ORAL | Status: AC | PRN
  Administered 2023-11-13: 4 mg via ORAL
  Filled 2023-11-13: qty 1

## 2023-11-13 MED ORDER — PROCHLORPERAZINE MALEATE 10 MG PO TABS
10.0000 mg | ORAL_TABLET | Freq: Two times a day (BID) | ORAL | 0 refills | Status: DC | PRN
  Filled 2023-11-13 – 2023-11-14 (×2): qty 10, 5d supply, fill #0

## 2023-11-13 NOTE — ED Provider Notes (Signed)
 [place] EMERGENCY DEPARTMENT AT [facility] Provider Note   CSN: [id number] Arrival date & time: 11/13/23  1612     History  Chief Complaint  Patient presents with   Emesis    Cindy Long is a 48 y.o. female with a past medical history of diabetes, migraine headaches, who presents to the emergency department chief complaint of nausea vomiting and diarrhea.  Patient states that yesterday she developed a global headache.  This is abnormal for her regular migraines that she normally gets some on the right side.  Patient reports that she had to leave church services early.  She took a Nurtec at home which she had a sample of.  She is able to go to sleep later.  Today she woke up with a pounding global headache light and sound sensitivity.  Later she developed nausea vomiting and diarrhea all of which have not resolved.  She continues to have a throbbing headache.  She denies fever or chills.   Emesis      Home Medications Prior to Admission medications   Medication Sig Start Date End Date Taking? Authorizing Provider  acetaminophen  (TYLENOL ) 500 MG tablet Take 1,000 mg by mouth every 6 (six) hours as needed for moderate pain or headache.    [provider]  albuterol  (VENTOLIN  HFA) 108 (90 Base) MCG/ACT inhaler Inhale 1-2 puffs into the lungs every 6 (six) hours as needed for wheezing or shortness of breath. 12/21/22   Geofm Glade PARAS, MD  aspirin  EC 81 MG tablet Take 1 tablet (81 mg total) by mouth daily. Swallow whole. 01/17/22   Wyn Jackee VEAR Mickey., NP  atorvastatin  (LIPITOR) 20 MG tablet Take 1 tablet (20 mg total) by mouth daily. 04/21/23 01/08/24  Walker, Caitlin S, NP  Bacillus Coagulans-Inulin (PROBIOTIC) 1-250 BILLION-MG CAPS Take 1 capsule by mouth daily.    [provider]  budesonide  (ENTOCORT EC ) 3 MG 24 hr capsule Take 3 capsules by mouth for 4 weeks, then 2 capsules for 2 weeks, then 1 capsule for 2 weeks, then stop 08/18/23     Continuous  Glucose Sensor (DEXCOM G7 SENSOR) MISC Use every 10 days as directed three times daily 05/24/23     dapagliflozin  propanediol (FARXIGA ) 10 MG TABS tablet Take 1 tablet (10 mg total) by mouth daily. 05/23/23   Plotnikov, Karlynn GAILS, MD  escitalopram  (LEXAPRO ) 20 MG tablet Take 2 tablets (40 mg total) by mouth daily. 05/23/23   Plotnikov, Aleksei V, MD  fluconazole  (DIFLUCAN ) 150 MG tablet Take 1 tablet by mouth daily once as needed yeast infection--this is a prescription for 3 courses as needed 05/04/23   Plotnikov, Aleksei V, MD  gabapentin  (NEURONTIN ) 300 MG capsule Take 1 capsule (300 mg total) by mouth at bedtime. 04/28/23 11/02/23  Camara, Amadou, MD  hyoscyamine  (LEVBID ) 0.375 MG 12 hr tablet Take 1 tablet  by mouth as needed as directed (30 days). 11/25/21   Karki, Arya, MD  hyoscyamine  (LEVBID ) 0.375 MG 12 hr tablet Take 1 tablet (0.375 mg total) by mouth every 12 (twelve) hours. 08/04/23     inFLIXimab  (REMICADE  IV) Inject into the vein every 8 (eight) weeks. Pt. Is unsure of dosing, but goes to have this done every 8 weeks    [provider]  Insulin  Disposable Pump (OMNIPOD 5 G6 INTRO, GEN 5,) KIT Change pod every 2 days as directed (Total daily dose of 100 units) 12/14/22     Insulin  Disposable Pump (OMNIPOD 5 DEXG7G6 PODS GEN  5) MISC Change pod every 2 days as directed (TDD of 100 units) 12/14/22     insulin  lispro (HUMALOG ) 100 UNIT/ML injection Up to 200 units per day/via insulin  pump Injection 30 days 03/17/23     Insulin  Pen Needle 32G X 4 MM MISC by Does not apply route. BD U/F Pen Needles Nano 4 mm x 32 G; use with insulin     [provider]  insulin  regular human CONCENTRATED (HUMULIN  R U-500 KWIKPEN) 500 UNIT/ML KwikPen Inject 5-50 Units into the skin 3 (three) times daily with meals. Take 10 units in the morning, 45 units at lunch, 25 units at dinner. Patient taking differently: Inject 5-50 Units into the skin 3 (three) times daily with meals. Take 25 units in the morning, 50  units at lunch, 25 units at dinner. 03/06/22   Sebastian Toribio GAILS, MD  LORazepam  (ATIVAN ) 1 MG tablet Take 0.5 tablets (0.5 mg total) by mouth daily as needed. 05/23/23   Plotnikov, Karlynn GAILS, MD  losartan  (COZAAR ) 50 MG tablet Take 1 tablet (50 mg total) by mouth daily. 05/23/23   Plotnikov, Aleksei V, MD  Magnesium  250 MG TABS Take 1 tablet by mouth daily.    [provider]  meclizine  (ANTIVERT ) 12.5 MG tablet Take 1 tablet (12.5 mg total) by mouth 3 (three) times daily as needed for dizziness. 11/30/22 11/30/23  Plotnikov, Aleksei V, MD  medroxyPROGESTERone  Acetate 150 MG/ML SUSY Inject 1 syringe into the muscle every 11-12 weeks 05/25/22     medroxyPROGESTERone  Acetate 150 MG/ML SUSY Inject 1 mL (150 mg total) into the muscle every 3 (three) months. (every 11 - 12 weeks) 06/09/23     metoprolol  succinate (TOPROL -XL) 25 MG 24 hr tablet Take 1 tablet (25 mg total) by mouth daily. May take additional half tablet as needed for tachycardia, palpitations. 04/21/23   Vannie Reche RAMAN, NP  nystatin -triamcinolone  (MYCOLOG II) cream APPLY TO THE AFFECTED AREA(S) TWO TIMES DAILY AS DIRECTED 09/25/21     oxyCODONE -acetaminophen  (PERCOCET) 5-325 MG tablet Take 1 tablet by mouth every 8 (eight) hours as needed for severe pain (pain score 7-10). 09/16/23   Mesner, Selinda, MD  promethazine  (PHENERGAN ) 25 MG tablet TAKE 1 TABLET BY MOUTH EVERY 8 HOURS 11/25/21     Riboflavin (VITAMIN B-2 PO) Take 1 tablet by mouth daily.    [provider]  rizatriptan  (MAXALT ) 10 MG tablet Take 1 tablet (10 mg total) by mouth once as needed for up to 1 dose for migraine (Migraine headaches). May repeat in 2 hours if needed 04/28/23   Camara, Amadou, MD  spironolactone  (ALDACTONE ) 25 MG tablet Take 0.5 tablets (12.5 mg total) by mouth daily. 05/23/23   Plotnikov, Aleksei V, MD  SYRINGE-NEEDLE, DISP, 3 ML (B-D 3CC LUER-LOK SYR 25GX5/8) 25G X 5/8 3 ML MISC Use to inject B12 under the skin 02/17/23   Plotnikov, Aleksei V, MD   tirzepatide  (MOUNJARO ) 5 MG/0.5ML Pen Inject 5 mg into the skin once a week. 04/14/23     triamcinolone  (KENALOG ) 0.1 % paste Use as directed at bedtime 08/02/23   Plotnikov, Aleksei V, MD  valACYclovir  (VALTREX ) 500 MG tablet Take 1 tablet (500 mg total) by mouth 2 (two) times daily. 08/02/23   Plotnikov, Aleksei V, MD  Zavegepant HCl (ZAVZPRET ) 10 MG/ACT SOLN Place 1 spray into the nose once as needed for up to 1 dose. 05/23/23   Plotnikov, Karlynn GAILS, MD      Allergies    Metronidazole, Shellfish allergy, Empagliflozin-linagliptin, Lisinopril,  Metformin  hcl, Prednisone , Dulaglutide, Liraglutide, and Nurtec [rimegepant sulfate]    Review of Systems   Review of Systems  Gastrointestinal:  Positive for vomiting.    Physical Exam Updated Vital Signs BP 118/68   Pulse 78   Temp 98.5 F (36.9 C) (Oral)   Resp 16   Ht 5' 6 (1.676 m)   Wt 75.1 kg   SpO2 95%   BMI 26.72 kg/m  Physical Exam Physical Exam  Constitutional: Pt is oriented to person, place, and time. Pt appears well-developed and well-nourished. No distress.  HENT:  Head: Normocephalic and atraumatic.  Mouth/Throat: Oropharynx is clear and moist.  Eyes: Conjunctivae and EOM are normal. Pupils are equal, round, and reactive to light. No scleral icterus.  No horizontal, vertical or rotational nystagmus  Neck: Normal range of motion. Neck supple.  Full active and passive ROM without pain No midline or paraspinal tenderness No nuchal rigidity or meningeal signs  Cardiovascular: Normal rate, regular rhythm and intact distal pulses.   Pulmonary/Chest: Effort normal and breath sounds normal. No respiratory distress. Pt has no wheezes. No rales.  Abdominal: Soft. Bowel sounds are normal. There is no tenderness. There is no rebound and no guarding.  Musculoskeletal: Normal range of motion.  Lymphadenopathy:    No cervical adenopathy.  Neurological: Pt. is alert and oriented to person, place, and time. He has normal reflexes. No  cranial nerve deficit.  Exhibits normal muscle tone. Coordination normal.  Mental Status:  Alert, oriented, thought content appropriate. Speech fluent without evidence of aphasia. Able to follow 2 step commands without difficulty.  Cranial Nerves:  II:  Peripheral visual fields grossly normal, pupils equal, round, reactive to light III,IV, VI: ptosis not present, extra-ocular motions intact bilaterally  V,VII: smile symmetric, facial light touch sensation equal VIII: hearing grossly normal bilaterally  IX,X: midline uvula rise  XI: bilateral shoulder shrug equal and strong XII: midline tongue extension  Motor:  5/5 in upper and lower extremities bilaterally including strong and equal grip strength and dorsiflexion/plantar flexion Sensory: Pinprick and light touch normal in all extremities.  Deep Tendon Reflexes: 2+ and symmetric  Cerebellar: normal finger-to-nose with bilateral upper extremities Gait: normal gait and balance CV: distal pulses palpable throughout   Skin: Skin is warm and dry. No rash noted. Pt is not diaphoretic.  Psychiatric: Pt has a normal mood and affect. Behavior is normal. Judgment and thought content normal.  Nursing note and vitals reviewed.  ED Results / Procedures / Treatments   Labs (all labs ordered are listed, but only abnormal results are displayed) Labs Reviewed  COMPREHENSIVE METABOLIC PANEL - Abnormal; Notable for the following components:      Result Value   Glucose, Bld 222 (*)    BUN 5 (*)    All other components within normal limits  CBC - Abnormal; Notable for the following components:   RBC 5.42 (*)    All other components within normal limits  URINALYSIS, ROUTINE W REFLEX MICROSCOPIC - Abnormal; Notable for the following components:   Glucose, UA >=500 (*)    Ketones, ur 5 (*)    Protein, ur 30 (*)    Bacteria, UA RARE (*)    All other components within normal limits  LIPASE, BLOOD  PREGNANCY, URINE    EKG None  Radiology No  results found.  Procedures Procedures    Medications Ordered in ED Medications  ondansetron  (ZOFRAN -ODT) disintegrating tablet 4 mg (4 mg Oral Given 11/13/23 1710)  sodium chloride  0.9 %  bolus 1,000 mL (1,000 mLs Intravenous New Bag/Given 11/13/23 2207)  diphenhydrAMINE  (BENADRYL ) injection 12.5 mg (12.5 mg Intravenous Given 11/13/23 2207)  ketorolac  (TORADOL ) 30 MG/ML injection 30 mg (30 mg Intravenous Given 11/13/23 2206)  prochlorperazine  (COMPAZINE ) injection 10 mg (10 mg Intravenous Given 11/13/23 2205)    ED Course/ Medical Decision Making/ A&P                                 Medical Decision Making Amount and/or Complexity of Data Reviewed Labs: ordered.  Risk Prescription drug management.   Roz LOISE Fuller presents with headache Given the large differential diagnosis for [employer], the decision making in this case is of high complexity. Suspect gastroenteritis Tx w:  Medications  ondansetron  (ZOFRAN -ODT) disintegrating tablet 4 mg (4 mg Oral Given 11/13/23 1710)  sodium chloride  0.9 % bolus 1,000 mL (1,000 mLs Intravenous New Bag/Given 11/13/23 2207)  diphenhydrAMINE  (BENADRYL ) injection 12.5 mg (12.5 mg Intravenous Given 11/13/23 2207)  ketorolac  (TORADOL ) 30 MG/ML injection 30 mg (30 mg Intravenous Given 11/13/23 2206)  prochlorperazine  (COMPAZINE ) injection 10 mg (10 mg Intravenous Given 11/13/23 2205)   Sxs resolved.   After evaluating all of the data points in this case, the presentation of SHAQUANA BUEL is NOT consistent with skull fracture, meningitis/encephalitis, SAH/sentinel bleed, Intracranial Hemorrhage (ICH) (subdural/epidural), acute obstructive hydrocephalus, space occupying lesions, CVA, CO Poisoning, Basilar/vertebral artery dissection, preeclampsia, cerebral venous thrombosis, hypertensive emergency, temporal Arteritis, Idiopathic Intracranial Hypertension (pseudotumor cerebri).  Strict return and follow-up precautions have been given by me personally  or by detailed written instructions verbalized by nursing staff using the teach back method to patient/family/caregiver.  Data Reviewed/Counseling: I have reviewed the patient's vital signs, nursing notes, and other relevant tests/information. I had a detailed discussion regarding the historical points, exam findings, and any diagnostic results supporting the discharge diagnosis. I also discussed the need for outpatient follow-up and the need to return to the ED if symptoms worsen or if there are any questions or concerns that arise at home         Final Clinical Impression(s) / ED Diagnoses Final diagnoses:  None    Rx / DC Orders ED Discharge Orders     None         Arloa Chroman, PA-C 11/13/23 2356    Francesca Elsie CROME, MD 11/18/23 1930

## 2023-11-13 NOTE — ED Notes (Signed)
 Cindy Long is req to be called at [phone] (estella wimbush) when pt is placed in a room

## 2023-11-13 NOTE — ED Triage Notes (Signed)
 Pt c/o vomiting throughout the day. Pt states headache starting yesterday and continuing today. Pt states has not ate since yesterday and worried her vomit is bile. Pt states abdominal pain.

## 2023-11-13 NOTE — Discharge Instructions (Signed)
Get help right away if you: Have chest pain. Have trouble breathing or you are breathing very quickly. Have a fast heartbeat. Feel extremely weak or you faint. Have a severe headache, a stiff neck, or both. Have a rash. Have severe pain, cramping, or bloating in your abdomen. Have skin that feels cold and clammy. Feel confused. Have pain when you urinate. Have signs of dehydration, such as: Dark urine, very little urine, or no urine. Cracked lips. Dry mouth. Sunken eyes. Sleepiness. Weakness. Have signs of bleeding, such as: Seeing blood in your vomit. Having vomit that looks like coffee grounds. Having bloody or black stools or stools that look like tar. These symptoms may be an emergency. Get help right away. Call 911. Do not wait to see if the symptoms will go away. Do not drive yourself to the hospital. 

## 2023-11-14 ENCOUNTER — Other Ambulatory Visit: Payer: Self-pay

## 2023-11-14 ENCOUNTER — Encounter: Payer: Self-pay | Admitting: Pharmacist

## 2023-11-14 ENCOUNTER — Other Ambulatory Visit (HOSPITAL_COMMUNITY): Payer: Self-pay

## 2023-11-15 ENCOUNTER — Other Ambulatory Visit (HOSPITAL_COMMUNITY): Payer: Self-pay

## 2023-11-16 ENCOUNTER — Other Ambulatory Visit: Payer: Self-pay

## 2023-11-17 ENCOUNTER — Other Ambulatory Visit: Payer: Self-pay

## 2023-11-17 ENCOUNTER — Other Ambulatory Visit (HOSPITAL_COMMUNITY): Payer: Self-pay

## 2023-11-17 ENCOUNTER — Encounter: Payer: Self-pay | Admitting: Gastroenterology

## 2023-11-17 ENCOUNTER — Encounter: Payer: Self-pay | Admitting: Internal Medicine

## 2023-11-17 ENCOUNTER — Ambulatory Visit: Payer: Commercial Managed Care - PPO | Admitting: Internal Medicine

## 2023-11-17 VITALS — BP 118/80 | HR 87 | Temp 99.0°F | Ht 66.0 in | Wt 171.0 lb

## 2023-11-17 DIAGNOSIS — R1111 Vomiting without nausea: Secondary | ICD-10-CM

## 2023-11-17 DIAGNOSIS — Z794 Long term (current) use of insulin: Secondary | ICD-10-CM

## 2023-11-17 DIAGNOSIS — G43009 Migraine without aura, not intractable, without status migrainosus: Secondary | ICD-10-CM | POA: Diagnosis not present

## 2023-11-17 DIAGNOSIS — E11618 Type 2 diabetes mellitus with other diabetic arthropathy: Secondary | ICD-10-CM

## 2023-11-17 DIAGNOSIS — J0101 Acute recurrent maxillary sinusitis: Secondary | ICD-10-CM | POA: Diagnosis not present

## 2023-11-17 DIAGNOSIS — J019 Acute sinusitis, unspecified: Secondary | ICD-10-CM | POA: Insufficient documentation

## 2023-11-17 MED ORDER — HYDROCODONE-ACETAMINOPHEN 5-325 MG PO TABS
1.0000 | ORAL_TABLET | Freq: Four times a day (QID) | ORAL | 0 refills | Status: DC | PRN
  Filled 2023-11-17 (×2): qty 20, 5d supply, fill #0

## 2023-11-17 MED ORDER — RIZATRIPTAN BENZOATE 10 MG PO TBDP
10.0000 mg | ORAL_TABLET | ORAL | 5 refills | Status: DC | PRN
  Filled 2023-11-17: qty 10, 10d supply, fill #0
  Filled 2023-11-17: qty 10, 30d supply, fill #0

## 2023-11-17 MED ORDER — FLUCONAZOLE 150 MG PO TABS
ORAL_TABLET | ORAL | 0 refills | Status: DC
  Filled 2023-11-17: qty 13, 30d supply, fill #0
  Filled 2023-11-17: qty 13, 13d supply, fill #0

## 2023-11-17 MED ORDER — CEFDINIR 300 MG PO CAPS
300.0000 mg | ORAL_CAPSULE | Freq: Two times a day (BID) | ORAL | 0 refills | Status: DC
Start: 2023-11-17 — End: 2023-11-28
  Filled 2023-11-17 (×2): qty 20, 10d supply, fill #0

## 2023-11-17 MED ORDER — NAPROXEN 500 MG PO TABS
500.0000 mg | ORAL_TABLET | Freq: Two times a day (BID) | ORAL | 2 refills | Status: DC | PRN
  Filled 2023-11-17 (×2): qty 60, 30d supply, fill #0

## 2023-11-17 NOTE — Assessment & Plan Note (Signed)
 Daily headaches x 1 wk.  Refractory.  Possibly stress related.  Did not try Zavzpret nasal sprayyet.  Continue Lexapro.  Rx: Naproxen, Norco,  Maxalt MLT prn Treat sinusitis F/u w/Dr Teresa Coombs soon hopefully

## 2023-11-17 NOTE — Progress Notes (Signed)
 Subjective:  Patient ID: Cindy Long, female    DOB: [birth year]  Age: 48 y.o. MRN: [id number]  CC: Follow-up (ER F/U)   HPI ASHLA MURPH presents for severe migraine this week  (Sat-Mon) - pt went to ER. Pt took Nurtec, Maxalt  - they did not help  C/o runny nose, upper teeth hurt, recent MRI w/sinusitis -  Outpatient Medications Prior to Visit  Medication Sig Dispense Refill   acetaminophen  (TYLENOL ) 500 MG tablet Take 1,000 mg by mouth every 6 (six) hours as needed for moderate pain or headache.     albuterol  (VENTOLIN  HFA) 108 (90 Base) MCG/ACT inhaler Inhale 1-2 puffs into the lungs every 6 (six) hours as needed for wheezing or shortness of breath. 18 g 5   aspirin  EC 81 MG tablet Take 1 tablet (81 mg total) by mouth daily. Swallow whole. 90 tablet 3   atorvastatin  (LIPITOR) 20 MG tablet Take 1 tablet (20 mg total) by mouth daily. 90 tablet 3   Bacillus Coagulans-Inulin (PROBIOTIC) 1-250 BILLION-MG CAPS Take 1 capsule by mouth daily.     budesonide  (ENTOCORT EC ) 3 MG 24 hr capsule Take 3 capsules by mouth for 4 weeks, then 2 capsules for 2 weeks, then 1 capsule for 2 weeks, then stop 126 capsule 0   Continuous Glucose Sensor (DEXCOM G7 SENSOR) MISC Use every 10 days as directed three times daily 9 each 4   dapagliflozin  propanediol (FARXIGA ) 10 MG TABS tablet Take 1 tablet (10 mg total) by mouth daily. 90 tablet 3   escitalopram  (LEXAPRO ) 20 MG tablet Take 2 tablets (40 mg total) by mouth daily. 180 tablet 3   hyoscyamine  (LEVBID ) 0.375 MG 12 hr tablet Take 1 tablet  by mouth as needed as directed (30 days). 30 tablet 1   hyoscyamine  (LEVBID ) 0.375 MG 12 hr tablet Take 1 tablet (0.375 mg total) by mouth every 12 (twelve) hours. 60 tablet 3   inFLIXimab  (REMICADE  IV) Inject into the vein every 8 (eight) weeks. Pt. Is unsure of dosing, but goes to have this done every 8 weeks     Insulin  Disposable Pump (OMNIPOD 5 DEXG7G6 PODS GEN 5) MISC Change pod every 2 days as directed  (TDD of 100 units) 30 each 4   Insulin  Disposable Pump (OMNIPOD 5 G6 INTRO, GEN 5,) KIT Change pod every 2 days as directed (Total daily dose of 100 units) 1 kit 0   insulin  lispro (HUMALOG ) 100 UNIT/ML injection Up to 200 units per day/via insulin  pump Injection 30 days 60 mL 11   Insulin  Pen Needle 32G X 4 MM MISC by Does not apply route. BD U/F Pen Needles Nano 4 mm x 32 G; use with insulin      insulin  regular human CONCENTRATED (HUMULIN  R U-500 KWIKPEN) 500 UNIT/ML KwikPen Inject 5-50 Units into the skin 3 (three) times daily with meals. Take 10 units in the morning, 45 units at lunch, 25 units at dinner. (Patient taking differently: Inject 5-50 Units into the skin 3 (three) times daily with meals. Take 25 units in the morning, 50 units at lunch, 25 units at dinner.) 24 mL 3   LORazepam  (ATIVAN ) 1 MG tablet Take 0.5 tablets (0.5 mg total) by mouth daily as needed. 60 tablet 1   losartan  (COZAAR ) 50 MG tablet Take 1 tablet (50 mg total) by mouth daily. 90 tablet 3   Magnesium  250 MG TABS Take 1 tablet by mouth daily.     meclizine  (ANTIVERT ) 12.5 MG tablet Take  1 tablet (12.5 mg total) by mouth 3 (three) times daily as needed for dizziness. 30 tablet 1   medroxyPROGESTERone  Acetate 150 MG/ML SUSY Inject 1 syringe into the muscle every 11-12 weeks 1 mL 11   medroxyPROGESTERone  Acetate 150 MG/ML SUSY Inject 1 mL (150 mg total) into the muscle every 3 (three) months. (every 11 - 12 weeks) 1 mL 11   metoprolol  succinate (TOPROL -XL) 25 MG 24 hr tablet Take 1 tablet (25 mg total) by mouth daily. May take additional half tablet as needed for tachycardia, palpitations. 135 tablet 3   nystatin -triamcinolone  (MYCOLOG II) cream APPLY TO THE AFFECTED AREA(S) TWO TIMES DAILY AS DIRECTED 20 g 2   prochlorperazine  (COMPAZINE ) 10 MG tablet Take 1 tablet (10 mg total) by mouth 2 (two) times daily as needed for nausea or vomiting. 10 tablet 0   promethazine  (PHENERGAN ) 25 MG tablet TAKE 1 TABLET BY MOUTH EVERY 8  HOURS 90 tablet 1   Riboflavin (VITAMIN B-2 PO) Take 1 tablet by mouth daily.     rizatriptan  (MAXALT ) 10 MG tablet Take 1 tablet (10 mg total) by mouth once as needed for up to 1 dose for migraine (Migraine headaches). May repeat in 2 hours if needed 18 tablet 6   spironolactone  (ALDACTONE ) 25 MG tablet Take 0.5 tablets (12.5 mg total) by mouth daily. 45 tablet 3   SYRINGE-NEEDLE, DISP, 3 ML (B-D 3CC LUER-LOK SYR 25GX5/8) 25G X 5/8 3 ML MISC Use to inject B12 under the skin 50 each 3   tirzepatide  (MOUNJARO ) 5 MG/0.5ML Pen Inject 5 mg into the skin once a week. 2 mL 12   triamcinolone  (KENALOG ) 0.1 % paste Use as directed at bedtime 5 g 1   valACYclovir  (VALTREX ) 500 MG tablet Take 1 tablet (500 mg total) by mouth 2 (two) times daily. 14 tablet 1   Zavegepant HCl (ZAVZPRET ) 10 MG/ACT SOLN Place 1 spray into the nose once as needed for up to 1 dose. 6 each 3   fluconazole  (DIFLUCAN ) 150 MG tablet Take 1 tablet by mouth daily once as needed yeast infection--this is a prescription for 3 courses as needed 13 tablet 0   naproxen  (NAPROSYN ) 375 MG tablet Take 1 tablet (375 mg total) by mouth 2 (two) times daily with a meal. 20 tablet 0   oxyCODONE -acetaminophen  (PERCOCET) 5-325 MG tablet Take 1 tablet by mouth every 8 (eight) hours as needed for severe pain (pain score 7-10). 10 tablet 0   gabapentin  (NEURONTIN ) 300 MG capsule Take 1 capsule (300 mg total) by mouth at bedtime. 90 capsule 1   No facility-administered medications prior to visit.    ROS: Review of Systems  Constitutional:  Positive for fatigue. Negative for activity change, appetite change, chills and unexpected weight change.  HENT:  Positive for congestion. Negative for hearing loss, mouth sores and sinus pressure.   Eyes:  Negative for visual disturbance.  Respiratory:  Negative for cough, chest tightness and shortness of breath.   Gastrointestinal:  Negative for abdominal pain and nausea.  Genitourinary:  Negative for  difficulty urinating, frequency and vaginal pain.  Musculoskeletal:  Negative for back pain and gait problem.  Skin:  Negative for pallor and rash.  Neurological:  Negative for dizziness, tremors, weakness, numbness and headaches.  Hematological:  Does not bruise/bleed easily.  Psychiatric/Behavioral:  Negative for confusion and sleep disturbance.     Objective:  BP 118/80 (BP Location: Left Arm, Patient Position: Sitting, Cuff Size: Normal)   Pulse 87  Temp 99 F (37.2 C) (Oral)   Ht 5' 6 (1.676 m)   Wt 171 lb (77.6 kg)   SpO2 98%   BMI 27.60 kg/m   BP Readings from Last 3 Encounters:  11/17/23 118/80  11/14/23 108/85  10/20/23 117/75    Wt Readings from Last 3 Encounters:  11/17/23 171 lb (77.6 kg)  11/13/23 165 lb 9.1 oz (75.1 kg)  10/20/23 165 lb 9.6 oz (75.1 kg)    Physical Exam Constitutional:      General: She is not in acute distress.    Appearance: She is well-developed. She is not ill-appearing.  HENT:     Head: Normocephalic.     Right Ear: External ear normal.     Left Ear: External ear normal.     Nose: Nose normal.  Eyes:     General:        Right eye: No discharge.        Left eye: No discharge.     Conjunctiva/sclera: Conjunctivae normal.     Pupils: Pupils are equal, round, and reactive to light.  Neck:     Thyroid : No thyromegaly.     Vascular: No JVD.     Trachea: No tracheal deviation.  Cardiovascular:     Rate and Rhythm: Normal rate and regular rhythm.     Heart sounds: Normal heart sounds.  Pulmonary:     Effort: No respiratory distress.     Breath sounds: No stridor. No wheezing.  Abdominal:     General: Bowel sounds are normal. There is no distension.     Palpations: Abdomen is soft. There is no mass.     Tenderness: There is no abdominal tenderness. There is no guarding or rebound.  Musculoskeletal:        General: No tenderness.     Cervical back: Normal range of motion and neck supple. No rigidity.  Lymphadenopathy:      Cervical: No cervical adenopathy.  Skin:    Findings: No erythema or rash.  Neurological:     Cranial Nerves: No cranial nerve deficit.     Motor: No abnormal muscle tone.     Coordination: Coordination normal.     Deep Tendon Reflexes: Reflexes normal.  Psychiatric:        Behavior: Behavior normal.        Thought Content: Thought content normal.        Judgment: Judgment normal.   L TM - fluid R ear w/wax Nasal mucosa - swollen  Lab Results  Component Value Date   WBC 9.1 11/13/2023   HGB 14.6 11/13/2023   HCT 44.5 11/13/2023   PLT 260 11/13/2023   GLUCOSE 222 (H) 11/13/2023   CHOL 116 05/24/2023   TRIG 125.0 05/24/2023   HDL 39.70 05/24/2023   LDLCALC 51 05/24/2023   ALT 25 11/13/2023   AST 16 11/13/2023   NA 137 11/13/2023   K 3.9 11/13/2023   CL 106 11/13/2023   CREATININE 0.47 11/13/2023   BUN 5 (L) 11/13/2023   CO2 23 11/13/2023   TSH 2.87 05/24/2023   HGBA1C 7.2 (H) 05/24/2023   MICROALBUR <0.7 05/24/2023    No results found.  Assessment & Plan:   Problem List Items Addressed This Visit     Type II diabetes mellitus (HCC)   Using OmniPod insulin  pump, Dexcom 7 Glucose is higher this week      Vomiting without nausea   Better Zofran  prn      Migraine headache  without aura - Primary   Daily headaches x 1 wk.  Refractory.  Possibly stress related.  Did not try Zavzpret  nasal sprayyet.  Continue Lexapro .  Rx: Naproxen , Norco,  Maxalt  MLT prn Treat sinusitis F/u w/Dr Gregg soon hopefully       Relevant Medications   rizatriptan  (MAXALT -MLT) 10 MG disintegrating tablet   naproxen  (NAPROSYN ) 500 MG tablet   HYDROcodone -acetaminophen  (NORCO/VICODIN) 5-325 MG tablet   Other Relevant Orders   Ambulatory referral to Neurology   Acute sinusitis   Cefdinir  x 10 d Diflucan  prn      Relevant Medications   cefdinir  (OMNICEF ) 300 MG capsule   fluconazole  (DIFLUCAN ) 150 MG tablet      Meds ordered this encounter  Medications   cefdinir   (OMNICEF ) 300 MG capsule    Sig: Take 1 capsule (300 mg total) by mouth 2 (two) times daily.    Dispense:  20 capsule    Refill:  0   fluconazole  (DIFLUCAN ) 150 MG tablet    Sig: Take 1 tablet by mouth daily once as needed yeast infection--this is a prescription for 3 courses as needed    Dispense:  13 tablet    Refill:  0    This is a prescription for 3 as needed courses.   rizatriptan  (MAXALT -MLT) 10 MG disintegrating tablet    Sig: Take 1 tablet (10 mg total) by mouth as needed for migraine. May repeat in 2 hours if needed    Dispense:  10 tablet    Refill:  5   naproxen  (NAPROSYN ) 500 MG tablet    Sig: Take 1 tablet (500 mg total) by mouth 2 (two) times daily as needed for headache.    Dispense:  60 tablet    Refill:  2   HYDROcodone -acetaminophen  (NORCO/VICODIN) 5-325 MG tablet    Sig: Take 1 tablet by mouth every 6 (six) hours as needed for severe pain (pain score 7-10).    Dispense:  20 tablet    Refill:  0      Follow-up: No follow-ups on file.  Marolyn Noel, MD

## 2023-11-17 NOTE — Assessment & Plan Note (Signed)
 Using OmniPod insulin pump, Dexcom 7 Glucose is higher this week

## 2023-11-17 NOTE — Assessment & Plan Note (Signed)
 Cefdinir x 10 d Diflucan prn

## 2023-11-17 NOTE — Assessment & Plan Note (Signed)
Better Zofran prn

## 2023-11-27 ENCOUNTER — Other Ambulatory Visit: Payer: Self-pay

## 2023-11-27 ENCOUNTER — Other Ambulatory Visit (HOSPITAL_COMMUNITY): Payer: Self-pay

## 2023-11-27 ENCOUNTER — Encounter: Payer: Self-pay | Admitting: Internal Medicine

## 2023-11-27 ENCOUNTER — Other Ambulatory Visit: Payer: Self-pay | Admitting: Neurology

## 2023-11-28 ENCOUNTER — Other Ambulatory Visit (HOSPITAL_COMMUNITY): Payer: Self-pay

## 2023-11-28 ENCOUNTER — Ambulatory Visit: Payer: Commercial Managed Care - PPO | Admitting: Neurology

## 2023-11-28 ENCOUNTER — Encounter (HOSPITAL_COMMUNITY): Payer: Self-pay

## 2023-11-28 ENCOUNTER — Encounter: Payer: Self-pay | Admitting: Neurology

## 2023-11-28 ENCOUNTER — Other Ambulatory Visit: Payer: Self-pay

## 2023-11-28 VITALS — BP 120/70 | HR 72 | Ht 66.0 in | Wt 171.0 lb

## 2023-11-28 DIAGNOSIS — G43709 Chronic migraine without aura, not intractable, without status migrainosus: Secondary | ICD-10-CM | POA: Diagnosis not present

## 2023-11-28 MED ORDER — AJOVY 225 MG/1.5ML ~~LOC~~ SOAJ
225.0000 mg | SUBCUTANEOUS | 11 refills | Status: AC
  Filled 2023-11-28 – 2024-02-17 (×3): qty 1.5, 30d supply, fill #0
  Filled 2024-05-11 – 2024-07-05 (×4): qty 1.5, 30d supply, fill #1
  Filled 2024-08-12: qty 1.5, 30d supply, fill #2

## 2023-11-28 NOTE — Progress Notes (Signed)
[facility]  PATIENT: Cindy Long DOB: 07/13/1976  REQUESTING CLINICIAN: Plotnikov, Georgina Quint, MD HISTORY FROM: Patient  REASON FOR VISIT: Headaches    HISTORICAL  CHIEF COMPLAINT:  Chief Complaint  Patient presents with   Follow-up    Pt in 12, here alone Pt is here for worsening migraines. Pt states has been having a migraine for the past 2 weeks. Pt reports sensitivity to light, sound, nausea and vomiting.    INTERVAL HISTORY 11/28/2023:  Patient presents today for follow-up, last visit was in July 2024.  At that time we started her on Ajovy since she could not tolerate the Nurtec.  She tells me that she did not start the medication, unclear if it was not approved or any other reasons.  She tells me for the past month her migraine frequency and intensity has worsened with nausea and vomiting, sensitivity to light and noise.  She presented to the hospital early January, was treated with migraine cocktail.  She continues to have headache daily.  PCP has prescribed her Zavzpret but she has not tried the medication yet.  Again in the past she has tried gabapentin, Nurtec, blood pressure medication (Metoprolol), Lexapro  as preventive medication.  Currently she is on Maxalt as abortive.    INTERVAL HISTORY 05/17/2023 Patient presents for follow-up, she is accompanied by her 59-year-old daughter.  She reports her migraine frequency has worsened.  She has been having daily headaches for the past 30 days, with light sensitivity, nausea.  Gabapentin was not helpful for headaches, Maxalt only provide little relief.  Again as preventive she has started antidepression, antihypertensive and gabapentin without any relief.  Will try on one of the new CGRP's.  HISTORY OF PRESENT ILLNESS:  This is a 48 year old woman past medical history of hypertension, hyperlipidemia, diabetes mellitus, history of migraine since 2018 who is presenting to establish care.  Patient reports with  her migraines, she used to take Maxalt with improvement of her symptoms, but lately her frequency has increased.  Currently she is getting about 10 plus headaches days per month.  She is not on any preventive medication.  She describes her headache as left-sided, starts at the left temple, radiates to the back, on the left side; sometimes there is left-sided neck pain.  Sometimes with the headache she does have word finding difficulty.  There are also migrainous features including photophobia, phonophobia and nausea. She denies focal neurological deficits with her migraines.    Headache History and Characteristics: Onset: 2018 Location: Start on the left, then move to the back  Quality:  Pressure  Intensity:8/10.  Duration:Lasting all day Migrainous Features: Photophobia, phonophobia, nausea Aura: No  History of brain injury or tumor: No  Family history: Grandmother and sister  Motion sickness: no Cardiac history: no  Prior prophylaxis: Propranolol: No but Metoprolol  Verapamil:No TCA: No Topamax: No Depakote: No Effexor: No but Lexapro Cymbalta: No Neurontin:Yes  Prior abortives: Triptan: Yes, Maxalt  Anti-emetic: No Steroids: No Ergotamine suppository: No    OTHER MEDICAL CONDITIONS: Hypertension, Hyperlipidemia, DMII, Chron's disease   REVIEW OF SYSTEMS: Full 14 system review of systems performed and negative with exception of: As noted in the HPI   ALLERGIES: Allergies  Allergen Reactions   Metronidazole Swelling    Throat swelling   Shellfish Allergy Swelling    Swelling of the throat   Empagliflozin-Linagliptin Other (See Comments)    headaches   Lisinopril Other (See Comments)   Metformin Hcl Other (See Comments)  Prednisone Other (See Comments)    Palpitations w/high dose   Dulaglutide Rash   Liraglutide Rash   Nurtec [Rimegepant Sulfate] Rash    she did not like how it made her feel    HOME MEDICATIONS: Outpatient Medications Prior to Visit   Medication Sig Dispense Refill   acetaminophen (TYLENOL) 500 MG tablet Take 1,000 mg by mouth every 6 (six) hours as needed for moderate pain or headache.     albuterol (VENTOLIN HFA) 108 (90 Base) MCG/ACT inhaler Inhale 1-2 puffs into the lungs every 6 (six) hours as needed for wheezing or shortness of breath. 18 g 5   aspirin EC 81 MG tablet Take 1 tablet (81 mg total) by mouth daily. Swallow whole. 90 tablet 3   atorvastatin (LIPITOR) 20 MG tablet Take 1 tablet (20 mg total) by mouth daily. 90 tablet 3   Bacillus Coagulans-Inulin (PROBIOTIC) 1-250 BILLION-MG CAPS Take 1 capsule by mouth daily.     Continuous Glucose Sensor (DEXCOM G7 SENSOR) MISC Change sensor every 10 days, on insulin three times a day 9 each 4   dapagliflozin propanediol (FARXIGA) 10 MG TABS tablet Take 1 tablet (10 mg total) by mouth daily. 90 tablet 3   escitalopram (LEXAPRO) 20 MG tablet Take 2 tablets (40 mg total) by mouth daily. 180 tablet 3   fluconazole (DIFLUCAN) 150 MG tablet Take 1 tablet by mouth daily once as needed yeast infection--this is a prescription for 3 courses as needed 13 tablet 0   HYDROcodone-acetaminophen (NORCO/VICODIN) 5-325 MG tablet Take 1 tablet by mouth every 6 (six) hours as needed for severe pain (pain score 7-10). 20 tablet 0   hyoscyamine (LEVBID) 0.375 MG 12 hr tablet Take 1 tablet  by mouth as needed as directed (30 days). 30 tablet 1   hyoscyamine (LEVBID) 0.375 MG 12 hr tablet Take 1 tablet (0.375 mg total) by mouth every 12 (twelve) hours. 60 tablet 3   inFLIXimab (REMICADE IV) Inject into the vein every 8 (eight) weeks. Pt. Is unsure of dosing, but goes to have this done every 8 weeks     Insulin Disposable Pump (OMNIPOD 5 DEXG7G6 PODS GEN 5) MISC Change pod every 2 days as directed (TDD of 100 units) 30 each 4   Insulin Disposable Pump (OMNIPOD 5 G6 INTRO, GEN 5,) KIT Change pod every 2 days as directed (Total daily dose of 100 units) 1 kit 0   insulin lispro (HUMALOG) 100 UNIT/ML  injection Up to 200 units per day/via insulin pump Injection 30 days 60 mL 11   Insulin Pen Needle 32G X 4 MM MISC by Does not apply route. BD U/F Pen Needles Nano 4 mm x 32 G; use with insulin     insulin regular human CONCENTRATED (HUMULIN R U-500 KWIKPEN) 500 UNIT/ML KwikPen Inject 5-50 Units into the skin 3 (three) times daily with meals. Take 10 units in the morning, 45 units at lunch, 25 units at dinner. (Patient taking differently: Inject 5-50 Units into the skin 3 (three) times daily with meals. Take 25 units in the morning, 50 units at lunch, 25 units at dinner.) 24 mL 3   LORazepam (ATIVAN) 1 MG tablet Take 0.5 tablets (0.5 mg total) by mouth daily as needed. 60 tablet 1   losartan (COZAAR) 50 MG tablet Take 1 tablet (50 mg total) by mouth daily. 90 tablet 3   Magnesium 250 MG TABS Take 1 tablet by mouth daily.     meclizine (ANTIVERT) 12.5 MG tablet Take  1 tablet (12.5 mg total) by mouth 3 (three) times daily as needed for dizziness. 30 tablet 1   medroxyPROGESTERone Acetate 150 MG/ML SUSY Inject 1 syringe into the muscle every 11-12 weeks 1 mL 11   medroxyPROGESTERone Acetate 150 MG/ML SUSY Inject 1 mL (150 mg total) into the muscle every 3 (three) months. (every 11 - 12 weeks) 1 mL 11   metoprolol succinate (TOPROL-XL) 25 MG 24 hr tablet Take 1 tablet (25 mg total) by mouth daily. May take additional half tablet as needed for tachycardia, palpitations. 135 tablet 3   naproxen (NAPROSYN) 500 MG tablet Take 1 tablet (500 mg total) by mouth 2 (two) times daily as needed for headache. 60 tablet 2   nystatin-triamcinolone (MYCOLOG II) cream APPLY TO THE AFFECTED AREA(S) TWO TIMES DAILY AS DIRECTED 20 g 2   prochlorperazine (COMPAZINE) 10 MG tablet Take 1 tablet (10 mg total) by mouth 2 (two) times daily as needed for nausea or vomiting. 10 tablet 0   promethazine (PHENERGAN) 25 MG tablet TAKE 1 TABLET BY MOUTH EVERY 8 HOURS 90 tablet 1   Riboflavin (VITAMIN B-2 PO) Take 1 tablet by mouth daily.      rizatriptan (MAXALT) 10 MG tablet Take 1 tablet (10 mg total) by mouth once as needed for up to 1 dose for migraine (Migraine headaches). May repeat in 2 hours if needed 18 tablet 6   rizatriptan (MAXALT-MLT) 10 MG disintegrating tablet Take 1 tablet (10 mg total) by mouth as needed for migraine. May repeat in 2 hours if needed 10 tablet 5   spironolactone (ALDACTONE) 25 MG tablet Take 0.5 tablets (12.5 mg total) by mouth daily. 45 tablet 3   SYRINGE-NEEDLE, DISP, 3 ML (B-D 3CC LUER-LOK SYR 25GX5/8") 25G X 5/8" 3 ML MISC Use to inject B12 under the skin 50 each 3   budesonide (ENTOCORT EC) 3 MG 24 hr capsule Take 3 capsules by mouth for 4 weeks, then 2 capsules for 2 weeks, then 1 capsule for 2 weeks, then stop 126 capsule 0   cefdinir (OMNICEF) 300 MG capsule Take 1 capsule (300 mg total) by mouth 2 (two) times daily. 20 capsule 0   gabapentin (NEURONTIN) 300 MG capsule Take 1 capsule (300 mg total) by mouth at bedtime. 90 capsule 1   tirzepatide (MOUNJARO) 5 MG/0.5ML Pen Inject 5 mg into the skin once a week. (Patient not taking: Reported on 11/28/2023) 2 mL 12   triamcinolone (KENALOG) 0.1 % paste Use as directed at bedtime (Patient not taking: Reported on 11/28/2023) 5 g 1   valACYclovir (VALTREX) 500 MG tablet Take 1 tablet (500 mg total) by mouth 2 (two) times daily. (Patient not taking: Reported on 11/28/2023) 14 tablet 1   Zavegepant HCl (ZAVZPRET) 10 MG/ACT SOLN Place 1 spray into the nose once as needed for up to 1 dose. (Patient not taking: Reported on 11/28/2023) 6 each 3   No facility-administered medications prior to visit.    PAST MEDICAL HISTORY: Past Medical History:  Diagnosis Date   Chest pressure 11/18/2021   Crohn disease (HCC)    Diabetes mellitus    IDDM, Type 2   Essential hypertension 11/18/2021   GERD (gastroesophageal reflux disease)    no meds currently   High cholesterol    Hypertension    Mastitis    right breast   Postpartum care following cesarean  delivery (2/10) 12/17/2013   Pure hypercholesterolemia 11/18/2021    PAST SURGICAL HISTORY: Past Surgical History:  Procedure Laterality  Date   CERVICAL CERCLAGE     CERVICAL CERCLAGE N/A 06/21/2013   Procedure: Eldridge Abrahams CERVICAL;  Surgeon: Serita Kyle, MD;  Location: [facility];  Service: Gynecology;  Laterality: N/A;   CESAREAN SECTION  2008   CESAREAN SECTION N/A 12/17/2013   Procedure: Repeat CESAREAN SECTION with Cerclage Removal;  Surgeon: Serita Kyle, MD;  Location: [facility];  Service: Obstetrics;  Laterality: N/A;  EDD: 12/22/13   CHOLECYSTECTOMY     CHOLECYSTECTOMY OPEN  08/08/2011   GANGLION CYST EXCISION Right 1997   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    FAMILY HISTORY: Family History  Problem Relation Age of Onset   Hypertension Mother    Hypercholesterolemia Mother    Migraines Sister    Other Sister        brain tumor   Headache Sister    Diabetes Maternal Aunt    Migraines Maternal Grandmother    Hypertension Maternal Grandmother    Cancer Maternal Grandmother        breast, colon   Diabetes Maternal Grandmother    Headache Maternal Grandmother    Hypertension Maternal Grandfather    Healthy Daughter    Healthy Daughter    Heart attack Cousin 82    SOCIAL HISTORY: Social History   Socioeconomic History   Marital status: Married    Spouse name: jermaine   Number of children: 2   Years of education: Not on file   Highest education level: Some college, no degree  Occupational History   Not on file  Tobacco Use   Smoking status: Never    Passive exposure: Never   Smokeless tobacco: Never  Vaping Use   Vaping status: Never Used  Substance and Sexual Activity   Alcohol use: Yes    Alcohol/week: 3.0 standard drinks of alcohol    Types: 3 Standard drinks or equivalent per week    Comment: occasionally   Drug use: No   Sexual activity: Yes    Birth control/protection: Injection  Other Topics Concern   Not on  file  Social History Narrative   LPN   Social Drivers of Health   Financial Resource Strain: Low Risk  (11/18/2021)   Overall Financial Resource Strain (CARDIA)    Difficulty of Paying Living Expenses: Not hard at all  Food Insecurity: No Food Insecurity (11/18/2021)   Hunger Vital Sign    Worried About Running Out of Food in the Last Year: Never true    Ran Out of Food in the Last Year: Never true  Transportation Needs: No Transportation Needs (11/18/2021)   PRAPARE - Administrator, Civil Service (Medical): No    Lack of Transportation (Non-Medical): No  Physical Activity: Inactive (11/18/2021)   Exercise Vital Sign    Days of Exercise per Week: 0 days    Minutes of Exercise per Session: 0 min  Stress: Not on file  Social Connections: Unknown (03/07/2022)   Received from [facility]   Social Network    Social Network: Not on file  Intimate Partner Violence: Unknown (02/07/2022)   Received from [facility]   HITS    Physically Hurt: Not on file    Insult or Talk Down To: Not on file    Threaten Physical Harm: Not on file    Scream or Curse: Not on file    PHYSICAL EXAM  GENERAL EXAM/CONSTITUTIONAL: Vitals:  Vitals:   11/28/23 0840  BP: 120/70  Pulse: 72  Weight:  171 lb (77.6 kg)  Height: 5\' 6"  (1.676 m)   Body mass index is 27.6 kg/m. Wt Readings from Last 3 Encounters:  11/28/23 171 lb (77.6 kg)  11/17/23 171 lb (77.6 kg)  11/13/23 165 lb 9.1 oz (75.1 kg)   Patient is in no distress; well developed, nourished and groomed; neck is supple  MUSCULOSKELETAL: Gait, strength, tone, movements noted in Neurologic exam below  NEUROLOGIC: MENTAL STATUS:      No data to display         awake, alert, oriented to person, place and time recent and remote memory intact normal attention and concentration language fluent, comprehension intact, naming intact fund of knowledge appropriate  CRANIAL NERVE:  2nd - no  papilledema or hemorrhages on fundoscopic exam 2nd, 3rd, 4th, 6th - pupils equal and reactive to light, visual fields full to confrontation, extraocular muscles intact, no nystagmus 5th - facial sensation symmetric 7th - facial strength symmetric 8th - hearing intact 9th - palate elevates symmetrically, uvula midline 11th - shoulder shrug symmetric 12th - tongue protrusion midline  MOTOR:  normal bulk and tone, full strength in the BUE, BLE  SENSORY:  normal and symmetric to light touch  COORDINATION:  finger-nose-finger, fine finger movements normal  GAIT/STATION:  normal   DIAGNOSTIC DATA (LABS, IMAGING, TESTING) - I reviewed patient records, labs, notes, testing and imaging myself where available.  Lab Results  Component Value Date   WBC 9.1 11/13/2023   HGB 14.6 11/13/2023   HCT 44.5 11/13/2023   MCV 82.1 11/13/2023   PLT 260 11/13/2023      Component Value Date/Time   NA 137 11/13/2023 1826   NA 140 11/18/2021 1605   K 3.9 11/13/2023 1826   CL 106 11/13/2023 1826   CO2 23 11/13/2023 1826   GLUCOSE 222 (H) 11/13/2023 1826   BUN 5 (L) 11/13/2023 1826   BUN 9 11/18/2021 1605   CREATININE 0.47 11/13/2023 1826   CALCIUM 9.2 11/13/2023 1826   PROT 6.9 11/13/2023 1826   ALBUMIN 3.6 11/13/2023 1826   AST 16 11/13/2023 1826   ALT 25 11/13/2023 1826   ALKPHOS 63 11/13/2023 1826   BILITOT 1.0 11/13/2023 1826   GFRNONAA >60 11/13/2023 1826   GFRAA >60 11/12/2019 1235   Lab Results  Component Value Date   CHOL 116 05/24/2023   HDL 39.70 05/24/2023   LDLCALC 51 05/24/2023   TRIG 125.0 05/24/2023   CHOLHDL 3 05/24/2023   Lab Results  Component Value Date   HGBA1C 7.2 (H) 05/24/2023   Lab Results  Component Value Date   VITAMINB12 206 (L) 07/16/2021   Lab Results  Component Value Date   TSH 2.87 05/24/2023    MRI Brain 02/28/2023 1. No acute intracranial process. No specific etiology for headaches identified. 2. Air-fluid level in the right maxillary  sinus, which can be seen in the setting of acute sinusitis. 3. On the post contrast-enhanced sequences there appears to be focal narrowing in the distal right M1. This may be artifactual. Consider further evaluation with an MRA for more definitive characterization in the exclude possibility of vascular stenosis.    ASSESSMENT AND PLAN  48 y.o. year old female with hypertension, hyperlipidemia, diabetes mellitus type 2, chron's disease who is presenting for management of her migraines.  She reports worsening of her migraines in the past month requiring a visit to the hospital.  She is not on any preventive medication, we will start her with Ajovy monthly injection, gave patient  a few samples.  Advised her to follow-up with pharmacy and get the Zavzpret for abortive medication.  She can continue with Maxalt, and advised her to take it with Aleve together for breakthrough migraine.  Advised her to contact me if Ajovy is not authorized, we will either try a different CGRP or give her more samples.  I will see her in 6 months for follow-up or sooner if worse.   1. Chronic migraine without aura without status migrainosus, not intractable     Patient Instructions  Start Ajovy monthly injection, if medication not authorized please contact me, will either to give you more samples to try different CGRP Follow-up with pharmacy to get your Zavzpret to use as abortive medication Continue with Maxalt, can take it together with Aleve as abortive medication for the migraines Continue to follow with PCP Return in 6 months or sooner if worse.   No orders of the defined types were placed in this encounter.   Meds ordered this encounter  Medications   Fremanezumab-vfrm (AJOVY) 225 MG/1.5ML SOAJ    Sig: Inject 225 mg into the skin every 30 (thirty) days.    Dispense:  1.5 mL    Refill:  11    Return in about 6 months (around 05/27/2024).    Windell Norfolk, MD 11/28/2023, 12:14 PM  [facility] [address], [address] [place], [place] [address] [phone]

## 2023-11-28 NOTE — Patient Instructions (Signed)
Start Ajovy monthly injection, if medication not authorized please contact me, will either to give you more samples to try different CGRP Follow-up with pharmacy to get your Zavzpret to use as abortive medication Continue with Maxalt, can take it together with Aleve as abortive medication for the migraines Continue to follow with PCP Return in 6 months or sooner if worse.

## 2023-11-28 NOTE — Telephone Encounter (Signed)
I SAW IN NOTE STATED GABAPENTIN NOT HELPFUL BUT WASN'T LISTED UNDER CONTINUE MEDS:  Patient Instructions  Start Medrol Dosepak for the next 6 days Start Ajovy as preventive medication, sample given to patient Continue with Maxalt, can take it with Naprosyn as needed for headache Follow-up in 3 months or sooner if worse.

## 2023-12-08 ENCOUNTER — Other Ambulatory Visit (HOSPITAL_COMMUNITY): Payer: Self-pay

## 2023-12-14 ENCOUNTER — Other Ambulatory Visit: Payer: Self-pay

## 2023-12-15 ENCOUNTER — Other Ambulatory Visit: Payer: Self-pay

## 2023-12-15 ENCOUNTER — Ambulatory Visit (INDEPENDENT_AMBULATORY_CARE_PROVIDER_SITE_OTHER): Payer: Commercial Managed Care - PPO

## 2023-12-15 VITALS — BP 122/79 | HR 91 | Temp 98.8°F | Resp 20 | Ht 66.0 in | Wt 166.8 lb

## 2023-12-15 DIAGNOSIS — K50919 Crohn's disease, unspecified, with unspecified complications: Secondary | ICD-10-CM | POA: Diagnosis not present

## 2023-12-15 MED ORDER — METHYLPREDNISOLONE SODIUM SUCC 40 MG IJ SOLR
40.0000 mg | Freq: Once | INTRAMUSCULAR | Status: AC
  Administered 2023-12-15: 40 mg via INTRAVENOUS
  Filled 2023-12-15: qty 1

## 2023-12-15 MED ORDER — DIPHENHYDRAMINE HCL 25 MG PO CAPS
25.0000 mg | ORAL_CAPSULE | Freq: Once | ORAL | Status: AC
  Administered 2023-12-15: 25 mg via ORAL
  Filled 2023-12-15: qty 1

## 2023-12-15 MED ORDER — SODIUM CHLORIDE 0.9 % IV SOLN
5.0000 mg/kg | Freq: Once | INTRAVENOUS | Status: AC
  Administered 2023-12-15: 400 mg via INTRAVENOUS
  Filled 2023-12-15: qty 40

## 2023-12-15 MED ORDER — ACETAMINOPHEN 325 MG PO TABS
650.0000 mg | ORAL_TABLET | Freq: Once | ORAL | Status: AC
  Administered 2023-12-15: 650 mg via ORAL
  Filled 2023-12-15: qty 2

## 2023-12-15 NOTE — Progress Notes (Signed)
 Diagnosis: Crohn's Disease  Provider:  Praveen Mannam MD  Procedure: IV Infusion  IV Type: Peripheral, IV Location: L Antecubital  Avsola  (infliximab -axxq), Dose: 400 mg  Infusion Start Time: 0939  Infusion Stop Time: 1150  Post Infusion IV Care: Peripheral IV Discontinued  Discharge: Condition: Good, Destination: Home . AVS Provided  Performed by:  Keltin Baird, RN

## 2023-12-22 ENCOUNTER — Other Ambulatory Visit: Payer: Self-pay

## 2023-12-22 ENCOUNTER — Other Ambulatory Visit (HOSPITAL_COMMUNITY): Payer: Self-pay

## 2023-12-22 DIAGNOSIS — R197 Diarrhea, unspecified: Secondary | ICD-10-CM | POA: Diagnosis not present

## 2023-12-22 DIAGNOSIS — R112 Nausea with vomiting, unspecified: Secondary | ICD-10-CM | POA: Diagnosis not present

## 2023-12-22 MED ORDER — ONDANSETRON 4 MG PO TBDP
4.0000 mg | ORAL_TABLET | Freq: Three times a day (TID) | ORAL | 0 refills | Status: DC
  Filled 2023-12-22: qty 9, 3d supply, fill #0

## 2023-12-23 ENCOUNTER — Other Ambulatory Visit (HOSPITAL_BASED_OUTPATIENT_CLINIC_OR_DEPARTMENT_OTHER): Payer: Self-pay

## 2023-12-23 ENCOUNTER — Other Ambulatory Visit (HOSPITAL_COMMUNITY): Payer: Self-pay

## 2023-12-25 ENCOUNTER — Other Ambulatory Visit (HOSPITAL_COMMUNITY): Payer: Self-pay

## 2023-12-25 ENCOUNTER — Other Ambulatory Visit: Payer: Self-pay

## 2023-12-25 MED ORDER — OMNIPOD 5 DEXG7G6 PODS GEN 5 MISC
3 refills | Status: AC
Start: 2023-12-25 — End: ?
  Filled 2023-12-25: qty 15, 30d supply, fill #0
  Filled 2024-02-06 – 2024-02-09 (×2): qty 15, 30d supply, fill #1
  Filled 2024-04-17: qty 15, 30d supply, fill #2
  Filled 2024-06-14: qty 15, 30d supply, fill #3
  Filled 2024-08-12 – 2024-08-22 (×2): qty 15, 30d supply, fill #4
  Filled 2024-09-27: qty 15, 30d supply, fill #5
  Filled 2024-12-06: qty 15, 30d supply, fill #6

## 2024-01-04 ENCOUNTER — Other Ambulatory Visit (HOSPITAL_COMMUNITY): Payer: Self-pay

## 2024-01-11 ENCOUNTER — Other Ambulatory Visit (HOSPITAL_COMMUNITY): Payer: Self-pay

## 2024-01-11 ENCOUNTER — Telehealth: Payer: Self-pay | Admitting: Pharmacy Technician

## 2024-01-11 NOTE — Telephone Encounter (Signed)
 Pharmacy Patient Advocate Encounter   Received notification from CoverMyMeds that prior authorization for AJOVY (fremanezumab-vfrm) injection 225MG /1.5ML auto-injectors is required/requested.   Insurance verification completed.   The patient is insured through [facility] .   Per test claim: PA required; PA submitted to above mentioned insurance via CoverMyMeds Key/confirmation #/EOC [id number] Status is pending

## 2024-01-11 NOTE — Telephone Encounter (Signed)
 Pharmacy Patient Advocate Encounter  Received notification from [facility] that Prior Authorization for AJOVY (fremanezumab-vfrm) injection 225MG /1.5ML auto-injectors  has been APPROVED from 01/11/2024 to 07/13/2024   PA #/Case ID/Reference #: [id number]

## 2024-01-12 ENCOUNTER — Other Ambulatory Visit: Payer: Self-pay

## 2024-01-12 ENCOUNTER — Other Ambulatory Visit (HOSPITAL_COMMUNITY): Payer: Self-pay

## 2024-01-12 DIAGNOSIS — I2510 Atherosclerotic heart disease of native coronary artery without angina pectoris: Secondary | ICD-10-CM | POA: Diagnosis not present

## 2024-01-12 DIAGNOSIS — E1165 Type 2 diabetes mellitus with hyperglycemia: Secondary | ICD-10-CM | POA: Diagnosis not present

## 2024-01-12 DIAGNOSIS — E663 Overweight: Secondary | ICD-10-CM | POA: Diagnosis not present

## 2024-01-12 DIAGNOSIS — I10 Essential (primary) hypertension: Secondary | ICD-10-CM | POA: Diagnosis not present

## 2024-01-12 DIAGNOSIS — Z794 Long term (current) use of insulin: Secondary | ICD-10-CM | POA: Diagnosis not present

## 2024-01-12 MED ORDER — INSULIN PEN NEEDLE 32G X 4 MM MISC
4 refills | Status: AC
  Filled 2024-01-12: qty 100, 33d supply, fill #0

## 2024-01-12 MED ORDER — TRESIBA FLEXTOUCH 200 UNIT/ML ~~LOC~~ SOPN
20.0000 [IU] | PEN_INJECTOR | Freq: Every day | SUBCUTANEOUS | 4 refills | Status: DC
Start: 2024-01-12 — End: 2024-05-23
  Filled 2024-01-12: qty 3, 30d supply, fill #0
  Filled 2024-02-15 – 2024-02-17 (×2): qty 3, 30d supply, fill #1
  Filled 2024-05-11: qty 3, 30d supply, fill #2

## 2024-01-16 ENCOUNTER — Telehealth: Payer: Self-pay | Admitting: Pulmonary Disease

## 2024-01-16 NOTE — Telephone Encounter (Addendum)
 We have received FMLA forms and they have been placed in the providers box.   Once complete please fax to: [phone]  ---  We have received a 2nd copy of FMLA pw.  TDW 3.19.25

## 2024-01-17 ENCOUNTER — Other Ambulatory Visit: Payer: Self-pay

## 2024-01-29 DIAGNOSIS — R232 Flushing: Secondary | ICD-10-CM | POA: Diagnosis not present

## 2024-01-29 DIAGNOSIS — R61 Generalized hyperhidrosis: Secondary | ICD-10-CM | POA: Diagnosis not present

## 2024-01-29 DIAGNOSIS — N644 Mastodynia: Secondary | ICD-10-CM | POA: Diagnosis not present

## 2024-02-06 ENCOUNTER — Other Ambulatory Visit: Payer: Self-pay

## 2024-02-07 ENCOUNTER — Other Ambulatory Visit: Payer: Self-pay

## 2024-02-08 ENCOUNTER — Other Ambulatory Visit: Payer: Self-pay

## 2024-02-09 ENCOUNTER — Other Ambulatory Visit (HOSPITAL_COMMUNITY): Payer: Self-pay

## 2024-02-09 ENCOUNTER — Other Ambulatory Visit: Payer: Self-pay

## 2024-02-09 ENCOUNTER — Ambulatory Visit: Payer: Commercial Managed Care - PPO

## 2024-02-09 VITALS — BP 126/77 | HR 97 | Temp 98.7°F | Resp 18 | Ht 66.0 in | Wt 178.4 lb

## 2024-02-09 DIAGNOSIS — K50919 Crohn's disease, unspecified, with unspecified complications: Secondary | ICD-10-CM | POA: Diagnosis not present

## 2024-02-09 DIAGNOSIS — N644 Mastodynia: Secondary | ICD-10-CM | POA: Diagnosis not present

## 2024-02-09 MED ORDER — ACETAMINOPHEN 325 MG PO TABS
650.0000 mg | ORAL_TABLET | Freq: Once | ORAL | Status: AC
  Administered 2024-02-09: 650 mg via ORAL
  Filled 2024-02-09: qty 2

## 2024-02-09 MED ORDER — METHYLPREDNISOLONE SODIUM SUCC 40 MG IJ SOLR
40.0000 mg | Freq: Once | INTRAMUSCULAR | Status: AC
Start: 2024-02-09 — End: 2024-02-09
  Administered 2024-02-09: 40 mg via INTRAVENOUS
  Filled 2024-02-09: qty 1

## 2024-02-09 MED ORDER — SODIUM CHLORIDE 0.9 % IV SOLN
5.0000 mg/kg | Freq: Once | INTRAVENOUS | Status: AC
  Administered 2024-02-09: 400 mg via INTRAVENOUS
  Filled 2024-02-09: qty 40

## 2024-02-09 MED ORDER — DIPHENHYDRAMINE HCL 25 MG PO CAPS
25.0000 mg | ORAL_CAPSULE | Freq: Once | ORAL | Status: AC
Start: 2024-02-09 — End: 2024-02-09
  Administered 2024-02-09: 25 mg via ORAL
  Filled 2024-02-09: qty 1

## 2024-02-09 NOTE — Progress Notes (Signed)
 Diagnosis: Crohn's Disease  Provider:  Chilton Greathouse MD  Procedure: IV Infusion  IV Type: Peripheral, IV Location: L Antecubital  Avsola (infliximab-axxq), Dose: 400 mg  Infusion Start Time: 1100  Infusion Stop Time: 1312  Post Infusion IV Care: Peripheral IV Discontinued  Discharge: Condition: Stable, Destination: Home . AVS Provided  Performed by:  Wyvonne Lenz, RN

## 2024-02-10 ENCOUNTER — Other Ambulatory Visit (HOSPITAL_COMMUNITY): Payer: Self-pay

## 2024-02-15 ENCOUNTER — Other Ambulatory Visit: Payer: Self-pay | Admitting: Internal Medicine

## 2024-02-15 MED ORDER — LORAZEPAM 1 MG PO TABS
0.5000 mg | ORAL_TABLET | Freq: Every day | ORAL | 1 refills | Status: AC | PRN
  Filled 2024-02-15: qty 15, 30d supply, fill #0
  Filled 2024-02-17: qty 45, 90d supply, fill #0
  Filled 2024-05-27 – 2024-08-12 (×2): qty 45, 90d supply, fill #1

## 2024-02-16 ENCOUNTER — Other Ambulatory Visit: Payer: Self-pay

## 2024-02-16 ENCOUNTER — Other Ambulatory Visit (HOSPITAL_COMMUNITY): Payer: Self-pay

## 2024-02-16 ENCOUNTER — Encounter: Payer: Self-pay | Admitting: Pharmacist

## 2024-02-17 ENCOUNTER — Other Ambulatory Visit (HOSPITAL_COMMUNITY): Payer: Self-pay

## 2024-02-19 ENCOUNTER — Other Ambulatory Visit: Payer: Self-pay

## 2024-02-21 ENCOUNTER — Other Ambulatory Visit (HOSPITAL_COMMUNITY): Payer: Self-pay

## 2024-02-22 ENCOUNTER — Other Ambulatory Visit: Payer: Self-pay

## 2024-02-22 ENCOUNTER — Other Ambulatory Visit (HOSPITAL_COMMUNITY): Payer: Self-pay

## 2024-02-22 MED ORDER — PROMETHAZINE HCL 25 MG PO TABS
25.0000 mg | ORAL_TABLET | Freq: Three times a day (TID) | ORAL | 1 refills | Status: AC
  Filled 2024-02-22: qty 90, 30d supply, fill #0

## 2024-03-01 DIAGNOSIS — Z8249 Family history of ischemic heart disease and other diseases of the circulatory system: Secondary | ICD-10-CM | POA: Diagnosis not present

## 2024-03-01 DIAGNOSIS — Z8349 Family history of other endocrine, nutritional and metabolic diseases: Secondary | ICD-10-CM | POA: Diagnosis not present

## 2024-03-01 DIAGNOSIS — Z713 Dietary counseling and surveillance: Secondary | ICD-10-CM | POA: Diagnosis not present

## 2024-03-01 DIAGNOSIS — Z832 Family history of diseases of the blood and blood-forming organs and certain disorders involving the immune mechanism: Secondary | ICD-10-CM | POA: Diagnosis not present

## 2024-03-01 DIAGNOSIS — Z6827 Body mass index (BMI) 27.0-27.9, adult: Secondary | ICD-10-CM | POA: Diagnosis not present

## 2024-03-01 DIAGNOSIS — Z8342 Family history of familial hypercholesterolemia: Secondary | ICD-10-CM | POA: Diagnosis not present

## 2024-03-02 ENCOUNTER — Other Ambulatory Visit (HOSPITAL_COMMUNITY): Payer: Self-pay

## 2024-03-04 ENCOUNTER — Encounter: Payer: Self-pay | Admitting: Internal Medicine

## 2024-03-11 NOTE — Telephone Encounter (Signed)
 I think this was sent to me by mistake as she is not patient in the pulmonary clinic CCing Dr. Georgia Kipper

## 2024-03-18 ENCOUNTER — Other Ambulatory Visit (HOSPITAL_COMMUNITY): Payer: Self-pay

## 2024-03-18 ENCOUNTER — Other Ambulatory Visit: Payer: Self-pay | Admitting: Internal Medicine

## 2024-03-18 MED ORDER — PHENTERMINE HCL 37.5 MG PO TABS
37.5000 mg | ORAL_TABLET | Freq: Every day | ORAL | 2 refills | Status: DC
  Filled 2024-03-18: qty 30, 30d supply, fill #0
  Filled 2024-04-17: qty 30, 30d supply, fill #1

## 2024-03-21 ENCOUNTER — Other Ambulatory Visit: Payer: Self-pay

## 2024-03-22 DIAGNOSIS — Z8342 Family history of familial hypercholesterolemia: Secondary | ICD-10-CM | POA: Diagnosis not present

## 2024-03-22 DIAGNOSIS — Z713 Dietary counseling and surveillance: Secondary | ICD-10-CM | POA: Diagnosis not present

## 2024-03-22 DIAGNOSIS — Z832 Family history of diseases of the blood and blood-forming organs and certain disorders involving the immune mechanism: Secondary | ICD-10-CM | POA: Diagnosis not present

## 2024-03-22 DIAGNOSIS — Z8349 Family history of other endocrine, nutritional and metabolic diseases: Secondary | ICD-10-CM | POA: Diagnosis not present

## 2024-03-22 DIAGNOSIS — Z8249 Family history of ischemic heart disease and other diseases of the circulatory system: Secondary | ICD-10-CM | POA: Diagnosis not present

## 2024-03-22 DIAGNOSIS — Z6827 Body mass index (BMI) 27.0-27.9, adult: Secondary | ICD-10-CM | POA: Diagnosis not present

## 2024-03-25 ENCOUNTER — Other Ambulatory Visit (HOSPITAL_COMMUNITY): Payer: Self-pay

## 2024-03-28 NOTE — Progress Notes (Signed)
 Ben Trevell Pariseau D.[facility] [address] [place] [address] Phone: [phone]   Assessment and Plan:    1. Patellofemoral arthritis 2. Bilateral chronic knee pain  -Chronic with exacerbation, subsequent visit - Recurrence of bilateral knee pain, primarily in patellofemoral compartment consistent with flare of mild degenerative changes in patellofemoral compartment - Patient has received significant relief in the past with intra-articular CSI with last injections on 09/15/2023 providing 4 to 5 months relief.  Patient elected for repeat bilateral intra-articular CSI.  Tolerated well per note below.  CSI may temporarily increase blood glucose in patient with past medical history of DM type II - May use Tylenol  for day-to-day pain relief  Procedure: Knee Joint Injection Side: Bilateral Indication: Flare of mild patellofemoral osteoarthritis  Risks explained and consent was given verbally. The site was cleaned with alcohol prep. A needle was introduced with an anterio-lateral approach. Injection given using 2mL of 1% lidocaine  without epinephrine  and 1mL of kenalog  40mg /ml. This was well tolerated and resulted in symptomatic relief.  Needle was removed, hemostasis achieved, and post injection instructions were explained.  Procedure was repeated on contralateral side.  Pt was advised to call or return to clinic if these symptoms worsen or fail to improve as anticipated.   Pertinent previous records reviewed include none  Follow Up: As needed.  Could consider repeat CSI if patient receives at least 3 months relief   Subjective:   I, Moenique Parris, am serving as a Neurosurgeon for Doctor Ulysees Gander  Chief Complaint: bilat knee pain   HPI:   03/29/2024 Patient is a 48 year old female with bialt knee pain. Patient states would like CSI bilateral. Pain is starting to come back since last visit    Relevant Historical Information: DM type II,  hypertension, Crohn's  Additional pertinent review of systems negative.   Current Outpatient Medications:    acetaminophen  (TYLENOL ) 500 MG tablet, Take 1,000 mg by mouth every 6 (six) hours as needed for moderate pain or headache., Disp: , Rfl:    albuterol  (VENTOLIN  HFA) 108 (90 Base) MCG/ACT inhaler, Inhale 1-2 puffs into the lungs every 6 (six) hours as needed for wheezing or shortness of breath., Disp: 18 g, Rfl: 5   aspirin  EC 81 MG tablet, Take 1 tablet (81 mg total) by mouth daily. Swallow whole., Disp: 90 tablet, Rfl: 3   atorvastatin  (LIPITOR) 20 MG tablet, Take 1 tablet (20 mg total) by mouth daily., Disp: 90 tablet, Rfl: 3   Bacillus Coagulans-Inulin (PROBIOTIC) 1-250 BILLION-MG CAPS, Take 1 capsule by mouth daily., Disp: , Rfl:    Continuous Glucose Sensor (DEXCOM G7 SENSOR) MISC, Change sensor every 10 days, on insulin  three times a day, Disp: 9 each, Rfl: 4   dapagliflozin  propanediol (FARXIGA ) 10 MG TABS tablet, Take 1 tablet (10 mg total) by mouth daily., Disp: 90 tablet, Rfl: 3   escitalopram  (LEXAPRO ) 20 MG tablet, Take 2 tablets (40 mg total) by mouth daily., Disp: 180 tablet, Rfl: 3   fluconazole  (DIFLUCAN ) 150 MG tablet, Take 1 tablet by mouth daily once as needed yeast infection--this is a prescription for 3 courses as needed, Disp: 13 tablet, Rfl: 0   Fremanezumab -vfrm (AJOVY ) 225 MG/1.5ML SOAJ, Inject 225 mg into the skin every 30 (thirty) days., Disp: 1.5 mL, Rfl: 11   HYDROcodone -acetaminophen  (NORCO/VICODIN) 5-325 MG tablet, Take 1 tablet by mouth every 6 (six) hours as needed for severe pain (pain score 7-10)., Disp: 20 tablet, Rfl: 0  hyoscyamine  (LEVBID) 0.375 MG 12 hr tablet, Take 1 tablet  by mouth as needed as directed (30 days)., Disp: 30 tablet, Rfl: 1   hyoscyamine  (LEVBID) 0.375 MG 12 hr tablet, Take 1 tablet (0.375 mg total) by mouth every 12 (twelve) hours., Disp: 60 tablet, Rfl: 3   inFLIXimab  (REMICADE  IV), Inject into the vein every 8 (eight) weeks. Pt.  Is unsure of dosing, but goes to have this done every 8 weeks, Disp: , Rfl:    insulin  degludec (TRESIBA  FLEXTOUCH) 200 UNIT/ML FlexTouch Pen, Inject 20 Units into the skin daily., Disp: 9 mL, Rfl: 4   Insulin  Disposable Pump (OMNIPOD 5 DEXG7G6 PODS GEN 5) MISC, Change pod every 2 days (total insulin  dose 100 units), Disp: 30 each, Rfl: 3   Insulin  Disposable Pump (OMNIPOD 5 G6 INTRO, GEN 5,) KIT, Change pod every 2 days as directed (Total daily dose of 100 units), Disp: 1 kit, Rfl: 0   insulin  lispro (HUMALOG ) 100 UNIT/ML injection, Up to 200 units per day/via insulin  pump Injection 30 days, Disp: 60 mL, Rfl: 11   Insulin  Pen Needle 32G X 4 MM MISC, by Does not apply route. BD U/F Pen Needles Nano 4 mm x 32 G; use with insulin , Disp: , Rfl:    Insulin  Pen Needle 32G X 4 MM MISC, Use three times daily., Disp: 300 each, Rfl: 4   insulin  regular human CONCENTRATED (HUMULIN  R U-500 KWIKPEN) 500 UNIT/ML KwikPen, Inject 5-50 Units into the skin 3 (three) times daily with meals. Take 10 units in the morning, 45 units at lunch, 25 units at dinner. (Patient taking differently: Inject 5-50 Units into the skin 3 (three) times daily with meals. Take 25 units in the morning, 50 units at lunch, 25 units at dinner.), Disp: 24 mL, Rfl: 3   LORazepam  (ATIVAN ) 1 MG tablet, Take 0.5 tablets (0.5 mg total) by mouth daily as needed., Disp: 60 tablet, Rfl: 1   losartan  (COZAAR ) 50 MG tablet, Take 1 tablet (50 mg total) by mouth daily., Disp: 90 tablet, Rfl: 3   Magnesium  250 MG TABS, Take 1 tablet by mouth daily., Disp: , Rfl:    medroxyPROGESTERone  Acetate 150 MG/ML SUSY, Inject 1 syringe into the muscle every 11-12 weeks, Disp: 1 mL, Rfl: 11   medroxyPROGESTERone  Acetate 150 MG/ML SUSY, Inject 1 mL (150 mg total) into the muscle every 3 (three) months. (every 11 - 12 weeks), Disp: 1 mL, Rfl: 11   metoprolol  succinate (TOPROL -XL) 25 MG 24 hr tablet, Take 1 tablet (25 mg total) by mouth daily. May take additional half  tablet as needed for tachycardia, palpitations., Disp: 135 tablet, Rfl: 3   naproxen  (NAPROSYN ) 500 MG tablet, Take 1 tablet (500 mg total) by mouth 2 (two) times daily as needed for headache., Disp: 60 tablet, Rfl: 2   nystatin -triamcinolone  (MYCOLOG II) cream, APPLY TO THE AFFECTED AREA(S) TWO TIMES DAILY AS DIRECTED, Disp: 20 g, Rfl: 2   phentermine  (ADIPEX-P ) 37.5 MG tablet, Take 1 tablet (37.5 mg total) by mouth daily before breakfast., Disp: 30 tablet, Rfl: 2   prochlorperazine  (COMPAZINE ) 10 MG tablet, Take 1 tablet (10 mg total) by mouth 2 (two) times daily as needed for nausea or vomiting., Disp: 10 tablet, Rfl: 0   promethazine  (PHENERGAN ) 25 MG tablet, TAKE 1 TABLET BY MOUTH EVERY 8 HOURS, Disp: 90 tablet, Rfl: 1   Riboflavin (VITAMIN B-2 PO), Take 1 tablet by mouth daily., Disp: , Rfl:    rizatriptan  (MAXALT ) 10 MG tablet,  Take 1 tablet (10 mg total) by mouth once as needed for up to 1 dose for migraine (Migraine headaches). May repeat in 2 hours if needed, Disp: 18 tablet, Rfl: 6   rizatriptan  (MAXALT -MLT) 10 MG disintegrating tablet, Take 1 tablet (10 mg total) by mouth as needed for migraine. May repeat in 2 hours if needed, Disp: 10 tablet, Rfl: 5   spironolactone  (ALDACTONE ) 25 MG tablet, Take 0.5 tablets (12.5 mg total) by mouth daily., Disp: 45 tablet, Rfl: 3   SYRINGE-NEEDLE, DISP, 3 ML (B-D 3CC LUER-LOK SYR 25GX5/8") 25G X 5/8" 3 ML MISC, Use to inject B12 under the skin, Disp: 50 each, Rfl: 3   tirzepatide  (MOUNJARO ) 5 MG/0.5ML Pen, Inject 5 mg into the skin once a week., Disp: 2 mL, Rfl: 12   triamcinolone  (KENALOG ) 0.1 % paste, Use as directed at bedtime, Disp: 5 g, Rfl: 1   valACYclovir  (VALTREX ) 500 MG tablet, Take 1 tablet (500 mg total) by mouth 2 (two) times daily., Disp: 14 tablet, Rfl: 1   Zavegepant HCl (ZAVZPRET ) 10 MG/ACT SOLN, Place 1 spray into the nose once as needed for up to 1 dose., Disp: 6 each, Rfl: 3   gabapentin  (NEURONTIN ) 300 MG capsule, Take 1 capsule  (300 mg total) by mouth at bedtime., Disp: 90 capsule, Rfl: 1   ondansetron  (ZOFRAN -ODT) 4 MG disintegrating tablet, Dissolve 1 tablet (4 mg total) by mouth every 8 (eight) hours as needed, Disp: 9 tablet, Rfl: 0   Objective:     Vitals:   03/29/24 0825  BP: 130/82  Pulse: 90  SpO2: 100%  Weight: 181 lb (82.1 kg)  Height: 5\' 6"  (1.676 m)      Body mass index is 29.21 kg/m.    Physical Exam:    General:  awake, alert oriented, no acute distress nontoxic Skin: no suspicious lesions or rashes Neuro:sensation intact and strength 5/5 with no deficits, no atrophy, normal muscle tone Psych: No signs of anxiety, depression or other mood disorder  Bilateral knee: Crepitus present No swelling No deformity Neg fluid wave, joint milking ROM Flex 110, Ext 0 NTTP over the quad tendon, medial fem condyle, lat fem condyle, patella, plica, patella tendon, tibial tuberostiy, fibular head, posterior fossa, pes anserine bursa, gerdy's tubercle, medial jt line, lateral jt line  Gait normal    Electronically signed by:  Marshall Skeeter [facility] 8:50 AM 03/29/24

## 2024-03-29 ENCOUNTER — Other Ambulatory Visit (HOSPITAL_COMMUNITY): Payer: Self-pay

## 2024-03-29 ENCOUNTER — Ambulatory Visit: Admitting: Sports Medicine

## 2024-03-29 VITALS — BP 130/82 | HR 90 | Ht 66.0 in | Wt 181.0 lb

## 2024-03-29 DIAGNOSIS — N912 Amenorrhea, unspecified: Secondary | ICD-10-CM | POA: Diagnosis not present

## 2024-03-29 DIAGNOSIS — M25562 Pain in left knee: Secondary | ICD-10-CM

## 2024-03-29 DIAGNOSIS — G8929 Other chronic pain: Secondary | ICD-10-CM | POA: Diagnosis not present

## 2024-03-29 DIAGNOSIS — M25561 Pain in right knee: Secondary | ICD-10-CM | POA: Diagnosis not present

## 2024-03-29 DIAGNOSIS — M1710 Unilateral primary osteoarthritis, unspecified knee: Secondary | ICD-10-CM

## 2024-03-29 DIAGNOSIS — Z3042 Encounter for surveillance of injectable contraceptive: Secondary | ICD-10-CM | POA: Diagnosis not present

## 2024-04-05 ENCOUNTER — Ambulatory Visit

## 2024-04-05 VITALS — BP 113/71 | HR 80 | Temp 99.2°F | Resp 16 | Ht 66.0 in | Wt 164.2 lb

## 2024-04-05 DIAGNOSIS — Z8342 Family history of familial hypercholesterolemia: Secondary | ICD-10-CM | POA: Diagnosis not present

## 2024-04-05 DIAGNOSIS — Z8349 Family history of other endocrine, nutritional and metabolic diseases: Secondary | ICD-10-CM | POA: Diagnosis not present

## 2024-04-05 DIAGNOSIS — Z713 Dietary counseling and surveillance: Secondary | ICD-10-CM | POA: Diagnosis not present

## 2024-04-05 DIAGNOSIS — Z832 Family history of diseases of the blood and blood-forming organs and certain disorders involving the immune mechanism: Secondary | ICD-10-CM | POA: Diagnosis not present

## 2024-04-05 DIAGNOSIS — K50919 Crohn's disease, unspecified, with unspecified complications: Secondary | ICD-10-CM | POA: Diagnosis not present

## 2024-04-05 DIAGNOSIS — Z6827 Body mass index (BMI) 27.0-27.9, adult: Secondary | ICD-10-CM | POA: Diagnosis not present

## 2024-04-05 DIAGNOSIS — Z8249 Family history of ischemic heart disease and other diseases of the circulatory system: Secondary | ICD-10-CM | POA: Diagnosis not present

## 2024-04-05 MED ORDER — METHYLPREDNISOLONE SODIUM SUCC 40 MG IJ SOLR
40.0000 mg | Freq: Once | INTRAMUSCULAR | Status: AC
  Administered 2024-04-05: 40 mg via INTRAVENOUS
  Filled 2024-04-05: qty 1

## 2024-04-05 MED ORDER — SODIUM CHLORIDE 0.9 % IV SOLN
5.0000 mg/kg | Freq: Once | INTRAVENOUS | Status: AC
  Administered 2024-04-05: 400 mg via INTRAVENOUS
  Filled 2024-04-05: qty 40

## 2024-04-05 MED ORDER — DIPHENHYDRAMINE HCL 25 MG PO CAPS
25.0000 mg | ORAL_CAPSULE | Freq: Once | ORAL | Status: AC
  Administered 2024-04-05: 25 mg via ORAL
  Filled 2024-04-05: qty 1

## 2024-04-05 MED ORDER — ACETAMINOPHEN 325 MG PO TABS
650.0000 mg | ORAL_TABLET | Freq: Once | ORAL | Status: AC
  Administered 2024-04-05: 650 mg via ORAL
  Filled 2024-04-05: qty 2

## 2024-04-05 NOTE — Progress Notes (Signed)
 Diagnosis: Crohn's Disease  Provider:  Praveen Mannam MD  Procedure: IV Infusion  IV Type: Peripheral, IV Location: L Antecubital  Avsola  (infliximab -axxq), Dose: 400 mg  Infusion Start Time: 0954  Infusion Stop Time: 1209  Post Infusion IV Care: Peripheral IV Discontinued  Discharge: Condition: Good, Destination: Home . AVS Provided  Performed by:  Shirly Dow, RN

## 2024-04-17 ENCOUNTER — Other Ambulatory Visit (HOSPITAL_COMMUNITY): Payer: Self-pay

## 2024-04-18 ENCOUNTER — Other Ambulatory Visit: Payer: Self-pay

## 2024-04-18 ENCOUNTER — Telehealth: Payer: Self-pay | Admitting: Pharmacy Technician

## 2024-04-18 NOTE — Telephone Encounter (Signed)
 Auth Submission: APPROVED Site of care: Site of care: [facility] Payer: [employer] Medication & CPT/J Code(s) submitted: Avsola  (infliximab -axxq) [phone] Route of submission (phone, fax, portal):  Phone # Fax # Auth type: Buy/Bill PB Units/visits requested: 5mg /kg  400mg  q8wks Reference number: [id number] Approval from: 04/17/24 to 04/16/24

## 2024-04-19 ENCOUNTER — Other Ambulatory Visit (HOSPITAL_COMMUNITY): Payer: Self-pay

## 2024-04-19 MED ORDER — INSULIN LISPRO 100 UNIT/ML IJ SOLN
INTRAMUSCULAR | 11 refills | Status: AC
  Filled 2024-04-19: qty 40, 28d supply, fill #0
  Filled 2024-05-31 – 2024-06-14 (×2): qty 40, 28d supply, fill #1
  Filled 2024-09-27: qty 40, 28d supply, fill #2
  Filled 2024-12-06: qty 40, 28d supply, fill #3

## 2024-04-29 ENCOUNTER — Other Ambulatory Visit (HOSPITAL_COMMUNITY): Payer: Self-pay

## 2024-04-29 ENCOUNTER — Encounter (HOSPITAL_COMMUNITY): Payer: Self-pay

## 2024-04-29 ENCOUNTER — Emergency Department (HOSPITAL_COMMUNITY)
Admission: EM | Admit: 2024-04-29 | Discharge: 2024-04-29 | Disposition: A | Discharge status: Home / Self Care | Attending: Emergency Medicine | Admitting: Emergency Medicine

## 2024-04-29 ENCOUNTER — Other Ambulatory Visit: Payer: Self-pay

## 2024-04-29 DIAGNOSIS — I10 Essential (primary) hypertension: Secondary | ICD-10-CM | POA: Diagnosis not present

## 2024-04-29 DIAGNOSIS — R112 Nausea with vomiting, unspecified: Secondary | ICD-10-CM | POA: Diagnosis not present

## 2024-04-29 DIAGNOSIS — Z7982 Long term (current) use of aspirin: Secondary | ICD-10-CM | POA: Diagnosis not present

## 2024-04-29 DIAGNOSIS — R109 Unspecified abdominal pain: Secondary | ICD-10-CM | POA: Diagnosis present

## 2024-04-29 DIAGNOSIS — Z794 Long term (current) use of insulin: Secondary | ICD-10-CM | POA: Diagnosis not present

## 2024-04-29 DIAGNOSIS — E119 Type 2 diabetes mellitus without complications: Secondary | ICD-10-CM | POA: Diagnosis not present

## 2024-04-29 DIAGNOSIS — N3000 Acute cystitis without hematuria: Secondary | ICD-10-CM | POA: Diagnosis not present

## 2024-04-29 DIAGNOSIS — Z79899 Other long term (current) drug therapy: Secondary | ICD-10-CM | POA: Diagnosis not present

## 2024-04-29 LAB — URINALYSIS, ROUTINE W REFLEX MICROSCOPIC
Bilirubin Urine: NEGATIVE
Glucose, UA: >=500 mg/dL — AB
Hgb urine dipstick: NEGATIVE
Ketones, ur: NEGATIVE mg/dL
Leukocytes,Ua: NEGATIVE
Nitrite: POSITIVE — AB
Protein, ur: NEGATIVE mg/dL
Specific Gravity, Urine: 1.02 (ref 1.005–1.030)
pH: 6 (ref 5.0–8.0)

## 2024-04-29 LAB — COMPREHENSIVE METABOLIC PANEL WITH GFR
ALT: 23 U/L (ref 0–44)
AST: 21 U/L (ref 15–41)
Albumin: 4.3 g/dL (ref 3.5–5.0)
Alkaline Phosphatase: 68 U/L (ref 38–126)
Anion gap: 16 — ABNORMAL HIGH (ref 5–15)
BUN: 8 mg/dL (ref 6–20)
CO2: 23 mmol/L (ref 22–32)
Calcium: 9.7 mg/dL (ref 8.9–10.3)
Chloride: 101 mmol/L (ref 98–111)
Creatinine, Ser: 0.7 mg/dL (ref 0.44–1.00)
GFR, Estimated: >60 mL/min (ref >60)
Glucose, Bld: 214 mg/dL — ABNORMAL HIGH (ref 70–99)
Potassium: 3.6 mmol/L (ref 3.5–5.1)
Sodium: 140 mmol/L (ref 135–145)
Total Bilirubin: 1.2 mg/dL (ref 0.0–1.2)
Total Protein: 8.1 g/dL (ref 6.5–8.1)

## 2024-04-29 LAB — CBC WITH DIFFERENTIAL/PLATELET
Abs Immature Granulocytes: 0.02 10*3/uL (ref 0.00–0.07)
Basophils Absolute: 0 10*3/uL (ref 0.0–0.1)
Basophils Relative: 0 %
Eosinophils Absolute: 0.1 10*3/uL (ref 0.0–0.5)
Eosinophils Relative: 1 %
HCT: 48.4 % — ABNORMAL HIGH (ref 36.0–46.0)
Hemoglobin: 15.9 g/dL — ABNORMAL HIGH (ref 12.0–15.0)
Immature Granulocytes: 0 %
Lymphocytes Relative: 38 %
Lymphs Abs: 3.6 10*3/uL (ref 0.7–4.0)
MCH: 26.7 pg (ref 26.0–34.0)
MCHC: 32.9 g/dL (ref 30.0–36.0)
MCV: 81.3 fL (ref 80.0–100.0)
Monocytes Absolute: 0.5 10*3/uL (ref 0.1–1.0)
Monocytes Relative: 5 %
Neutro Abs: 5.2 10*3/uL (ref 1.7–7.7)
Neutrophils Relative %: 56 %
Platelets: 254 10*3/uL (ref 150–400)
RBC: 5.95 MIL/uL — ABNORMAL HIGH (ref 3.87–5.11)
RDW: 14.1 % (ref 11.5–15.5)
WBC: 9.4 10*3/uL (ref 4.0–10.5)
nRBC: 0 % (ref 0.0–0.2)

## 2024-04-29 LAB — LIPASE, BLOOD: Lipase: 36 U/L (ref 11–51)

## 2024-04-29 LAB — RESP PANEL BY RT-PCR (RSV, FLU A&B, COVID)  RVPGX2
Influenza A by PCR: NEGATIVE
Influenza B by PCR: NEGATIVE
Resp Syncytial Virus by PCR: NEGATIVE
SARS Coronavirus 2 by RT PCR: NEGATIVE

## 2024-04-29 LAB — HCG, QUANTITATIVE, PREGNANCY: hCG, Beta Chain, Quant, S: <1 m[IU]/mL (ref <5)

## 2024-04-29 MED ORDER — LACTATED RINGERS IV BOLUS
1000.0000 mL | Freq: Once | INTRAVENOUS | Status: AC
  Administered 2024-04-29: 1000 mL via INTRAVENOUS

## 2024-04-29 MED ORDER — ONDANSETRON HCL 4 MG/2ML IJ SOLN
4.0000 mg | Freq: Once | INTRAMUSCULAR | Status: AC
  Administered 2024-04-29: 4 mg via INTRAVENOUS
  Filled 2024-04-29: qty 2

## 2024-04-29 MED ORDER — CEPHALEXIN 250 MG PO CAPS: 250.0000 mg | ORAL_CAPSULE | Freq: Four times a day (QID) | ORAL | 0 refills | Status: DC

## 2024-04-29 MED ORDER — SODIUM CHLORIDE 0.9 % IV SOLN
1.0000 g | Freq: Once | INTRAVENOUS | Status: AC
  Administered 2024-04-29: 1 g via INTRAVENOUS
  Filled 2024-04-29: qty 10

## 2024-04-29 MED ORDER — ONDANSETRON 4 MG PO TBDP: 4.0000 mg | ORAL_TABLET | Freq: Three times a day (TID) | ORAL | 0 refills | Status: DC | PRN

## 2024-04-29 NOTE — ED Provider Notes (Signed)
 [place] EMERGENCY DEPARTMENT AT [facility] Provider Note   CSN: [id number] Arrival date & time: 04/29/24  2000     History  Chief Complaint  Patient presents with   Emesis    Cindy Long is a 48 y.o. female with PMH as listed below who presents with  N/V that started last night after getting back from cruise. No constipation/diarrhea. No f/c. Had some mild central abdominal pain which isn't hurting now. No other flu-like sxs. No sick contacts that she is aware of. Drank some EtOH on the cruise but didn't binge. No hematochezia/melena. No urinary or vaginal sxs.    Past Medical History:  Diagnosis Date   Chest pressure 11/18/2021   Crohn disease (HCC)    Diabetes mellitus    IDDM, Type 2   Essential hypertension 11/18/2021   GERD (gastroesophageal reflux disease)    no meds currently   High cholesterol    Hypertension    Mastitis    right breast   Postpartum care following cesarean delivery (2/10) 12/17/2013   Pure hypercholesterolemia 11/18/2021       Home Medications Prior to Admission medications   Medication Sig Start Date End Date Taking? Authorizing Provider  cephALEXin  (KEFLEX ) 250 MG capsule Take 1 capsule (250 mg total) by mouth 4 (four) times daily. 04/29/24  Yes Franklyn Sid LOISE, MD  ondansetron  (ZOFRAN -ODT) 4 MG disintegrating tablet Take 1 tablet (4 mg total) by mouth every 8 (eight) hours as needed. 04/29/24  Yes Franklyn Sid LOISE, MD  acetaminophen  (TYLENOL ) 500 MG tablet Take 1,000 mg by mouth every 6 (six) hours as needed for moderate pain or headache.    [provider]  albuterol  (VENTOLIN  HFA) 108 (90 Base) MCG/ACT inhaler Inhale 1-2 puffs into the lungs every 6 (six) hours as needed for wheezing or shortness of breath. 12/21/22   Geofm Glade PARAS, MD  aspirin  EC 81 MG tablet Take 1 tablet (81 mg total) by mouth daily. Swallow whole. 01/17/22   Wyn Jackee VEAR Mickey., NP  atorvastatin  (LIPITOR) 20 MG tablet Take 1 tablet (20 mg total)  by mouth daily. 04/21/23 05/17/24  Vannie Reche RAMAN, NP  Bacillus Coagulans-Inulin (PROBIOTIC) 1-250 BILLION-MG CAPS Take 1 capsule by mouth daily.    [provider]  Continuous Glucose Sensor (DEXCOM G7 SENSOR) MISC Change sensor every 10 days, on insulin  three times a day 05/24/23     dapagliflozin  propanediol (FARXIGA ) 10 MG TABS tablet Take 1 tablet (10 mg total) by mouth daily. 05/23/23   Plotnikov, Karlynn GAILS, MD  escitalopram  (LEXAPRO ) 20 MG tablet Take 2 tablets (40 mg total) by mouth daily. 05/23/23   Plotnikov, Aleksei V, MD  fluconazole  (DIFLUCAN ) 150 MG tablet Take 1 tablet by mouth daily once as needed yeast infection--this is a prescription for 3 courses as needed 11/17/23   Plotnikov, Karlynn GAILS, MD  Fremanezumab -vfrm (AJOVY ) 225 MG/1.5ML SOAJ Inject 225 mg into the skin every 30 (thirty) days. 11/28/23   Camara, Amadou, MD  gabapentin  (NEURONTIN ) 300 MG capsule Take 1 capsule (300 mg total) by mouth at bedtime. 04/28/23 11/02/23  Gregg Lek, MD  HYDROcodone -acetaminophen  (NORCO/VICODIN) 5-325 MG tablet Take 1 tablet by mouth every 6 (six) hours as needed for severe pain (pain score 7-10). 11/17/23 11/16/24  Plotnikov, Karlynn GAILS, MD  hyoscyamine  (LEVBID ) 0.375 MG 12 hr tablet Take 1 tablet  by mouth as needed as directed (30 days). 11/25/21   Saintclair Jasper, MD  hyoscyamine  (LEVBID ) 0.375 MG 12 hr tablet  Take 1 tablet (0.375 mg total) by mouth every 12 (twelve) hours. 08/04/23     inFLIXimab  (REMICADE  IV) Inject into the vein every 8 (eight) weeks. Pt. Is unsure of dosing, but goes to have this done every 8 weeks    [provider]  insulin  degludec (TRESIBA  FLEXTOUCH) 200 UNIT/ML FlexTouch Pen Inject 20 Units into the skin daily. 01/12/24     Insulin  Disposable Pump (OMNIPOD 5 DEXG7G6 PODS GEN 5) MISC Change pod every 2 days (total insulin  dose 100 units) 12/25/23     Insulin  Disposable Pump (OMNIPOD 5 G6 INTRO, GEN 5,) KIT Change pod every 2 days as directed (Total daily dose of  100 units) 12/14/22     insulin  lispro (HUMALOG ) 100 UNIT/ML injection Inject up to 200 units per day/via insulin  pump 04/19/24     Insulin  Pen Needle 32G X 4 MM MISC by Does not apply route. BD U/F Pen Needles Nano 4 mm x 32 G; use with insulin     [provider]  Insulin  Pen Needle 32G X 4 MM MISC Use three times daily. 01/12/24   Faythe Purchase, MD  insulin  regular human CONCENTRATED (HUMULIN  R U-500 KWIKPEN) 500 UNIT/ML KwikPen Inject 5-50 Units into the skin 3 (three) times daily with meals. Take 10 units in the morning, 45 units at lunch, 25 units at dinner. Patient taking differently: Inject 5-50 Units into the skin 3 (three) times daily with meals. Take 25 units in the morning, 50 units at lunch, 25 units at dinner. 03/06/22   Sebastian Toribio GAILS, MD  LORazepam  (ATIVAN ) 1 MG tablet Take 0.5 tablets (0.5 mg total) by mouth daily as needed. 02/15/24   Webb, Padonda B, FNP  losartan  (COZAAR ) 50 MG tablet Take 1 tablet (50 mg total) by mouth daily. 05/23/23   Plotnikov, Aleksei V, MD  Magnesium  250 MG TABS Take 1 tablet by mouth daily.    [provider]  medroxyPROGESTERone  Acetate 150 MG/ML SUSY Inject 1 syringe into the muscle every 11-12 weeks 05/25/22     medroxyPROGESTERone  Acetate 150 MG/ML SUSY Inject 1 mL (150 mg total) into the muscle every 3 (three) months. (every 11 - 12 weeks) 06/09/23     metoprolol  succinate (TOPROL -XL) 25 MG 24 hr tablet Take 1 tablet (25 mg total) by mouth daily. May take additional half tablet as needed for tachycardia, palpitations. 04/21/23   Vannie Reche RAMAN, NP  naproxen  (NAPROSYN ) 500 MG tablet Take 1 tablet (500 mg total) by mouth 2 (two) times daily as needed for headache. 11/17/23   Plotnikov, Aleksei V, MD  nystatin -triamcinolone  (MYCOLOG II) cream APPLY TO THE AFFECTED AREA(S) TWO TIMES DAILY AS DIRECTED 09/25/21     phentermine  (ADIPEX-P ) 37.5 MG tablet Take 1 tablet (37.5 mg total) by mouth daily before breakfast. 03/18/24 05/21/24  Plotnikov,  Aleksei V, MD  prochlorperazine  (COMPAZINE ) 10 MG tablet Take 1 tablet (10 mg total) by mouth 2 (two) times daily as needed for nausea or vomiting. 11/13/23   Harris, Abigail, PA-C  promethazine  (PHENERGAN ) 25 MG tablet TAKE 1 TABLET BY MOUTH EVERY 8 HOURS 02/22/24     Riboflavin (VITAMIN B-2 PO) Take 1 tablet by mouth daily.    [provider]  rizatriptan  (MAXALT ) 10 MG tablet Take 1 tablet (10 mg total) by mouth once as needed for up to 1 dose for migraine (Migraine headaches). May repeat in 2 hours if needed 04/28/23   Camara, Amadou, MD  rizatriptan  (MAXALT -MLT) 10 MG disintegrating tablet Take 1 tablet (  10 mg total) by mouth as needed for migraine. May repeat in 2 hours if needed 11/17/23   Plotnikov, Aleksei V, MD  spironolactone  (ALDACTONE ) 25 MG tablet Take 0.5 tablets (12.5 mg total) by mouth daily. 05/23/23   Plotnikov, Aleksei V, MD  SYRINGE-NEEDLE, DISP, 3 ML (B-D 3CC LUER-LOK SYR 25GX5/8) 25G X 5/8 3 ML MISC Use to inject B12 under the skin 02/17/23   Plotnikov, Aleksei V, MD  tirzepatide  (MOUNJARO ) 5 MG/0.5ML Pen Inject 5 mg into the skin once a week. 04/14/23     triamcinolone  (KENALOG ) 0.1 % paste Use as directed at bedtime 08/02/23   Plotnikov, Aleksei V, MD  valACYclovir  (VALTREX ) 500 MG tablet Take 1 tablet (500 mg total) by mouth 2 (two) times daily. 08/02/23   Plotnikov, Aleksei V, MD  Zavegepant HCl (ZAVZPRET ) 10 MG/ACT SOLN Place 1 spray into the nose once as needed for up to 1 dose. 05/23/23   Plotnikov, Karlynn GAILS, MD      Allergies    Metronidazole, Shellfish allergy, Empagliflozin-linagliptin, Lisinopril, Metformin  hcl, Prednisone , Dulaglutide, Liraglutide, and Nurtec [rimegepant sulfate]    Review of Systems   Review of Systems A 10 point review of systems was performed and is negative unless otherwise reported in HPI.  Physical Exam Updated Vital Signs BP (!) 152/90 (BP Location: Right Arm)   Pulse 86   Temp 98.1 F (36.7 C)   Resp 20   Ht 5' 6 (1.676 m)   Wt  73.9 kg   SpO2 100%   BMI 26.31 kg/m  Physical Exam General: Normal appearing female, lying in bed.  HEENT: PERRLA, Sclera anicteric, MMM, trachea midline.  Cardiology: RRR, no murmurs/rubs/gallops. BL radial and DP pulses equal bilaterally.  Resp: Normal respiratory rate and effort. CTAB, no wheezes, rhonchi, crackles.  Abd: Soft, mild central abd TTP, non-distended. No rebound tenderness or guarding.  GU: Deferred. MSK: No peripheral edema or signs of trauma. Extremities without deformity or TTP. No cyanosis or clubbing. Skin: warm, dry.  Back: No CVA tenderness Neuro: A&Ox4, CNs II-XII grossly intact. MAEs. Sensation grossly intact.  Psych: Normal mood and affect.   ED Results / Procedures / Treatments   Labs (all labs ordered are listed, but only abnormal results are displayed) Labs Reviewed  URINE CULTURE - Abnormal; Notable for the following components:      Result Value   Culture   (*)    Value: 50,000 COLONIES/mL LACTOBACILLUS SPECIES Standardized susceptibility testing for this organism is not available. Performed at [facility], [address], [place], [place] [address]    All other components within normal limits  CBC WITH DIFFERENTIAL/PLATELET - Abnormal; Notable for the following components:   RBC 5.95 (*)    Hemoglobin 15.9 (*)    HCT 48.4 (*)    All other components within normal limits  COMPREHENSIVE METABOLIC PANEL WITH GFR - Abnormal; Notable for the following components:   Glucose, Bld 214 (*)    Anion gap 16 (*)    All other components within normal limits  URINALYSIS, ROUTINE W REFLEX MICROSCOPIC - Abnormal; Notable for the following components:   Glucose, UA >=500 (*)    Nitrite POSITIVE (*)    Bacteria, UA MANY (*)    All other components within normal limits  RESP PANEL BY RT-PCR (RSV, FLU A&B, COVID)  [id number]  HCG, QUANTITATIVE, PREGNANCY  LIPASE, BLOOD    EKG None  Radiology No results found.  Procedures Procedures     Medications Ordered  in ED Medications  ondansetron  (ZOFRAN ) injection 4 mg (4 mg Intravenous Given 04/29/24 2103)  lactated ringers  bolus 1,000 mL (0 mLs Intravenous Stopped 04/29/24 2213)  lactated ringers  bolus 1,000 mL (0 mLs Intravenous Stopped 04/29/24 2302)  cefTRIAXone  (ROCEPHIN ) 1 g in sodium chloride  0.9 % 100 mL IVPB (0 g Intravenous Stopped 04/29/24 2334)    ED Course/ Medical Decision Making/ A&P                          Medical Decision Making Amount and/or Complexity of Data Reviewed Labs: ordered. Decision-making details documented in ED Course.  Risk Prescription drug management.    This patient presents to the ED for concern of nausea/vomiting, mild abdominal pain, this involves an extensive number of treatment options, and is a complaint that carries with it a high risk of complications and morbidity.  I considered the following differential and admission for this acute, potentially life threatening condition. Overall patient is HDS, afebrile, non-toxic appearing.  MDM:    DDX for this patient's nausea/vomiting includes but is not limited to:  Consider gastroenteritis, gastritis/PUD. Lower c/f pancreatitis w/ normal lipase. She has no RUQ TTP or murphy's sign, no elevated LFTs to indicate cholelithiasis or cholecystitis. She has mild hyperglycemia but no DKA. Lower c/f intra-abdominal surgical  infections including appendicitis, diverticulitis given non-surgical abdominal exam. Lower c/f SBO. Preg neg. Reassuringly no electrolyte abnormalities or renal injury due to dehydration/hypovolemia. She is given zofran /fluids and she improves significantly. Ultimately UA is positive for UTI which could be contributing to her symptoms. She is given ceftriaxone  here and will prescribe keflex  and zofran  ODT for home. DC w/ discharge instructions/return precautions. All questions answered to patient's satisfaction.  Instructed to f/u with PCP within 1 week.   Given  zofran /fluids. Clinical Course as of 05/03/24 9090  Mon Apr 29, 2024  2208 HCG, Beta Chain, Quant, S: <1 neg [HN]  2228 Glucose(!): 214 Mild hyperglycemia and increased AG but normal CO2, no ketones in urine, no c/f DKA [HN]  2229 Resp panel by RT-PCR (RSV, Flu A&B, Covid) Urine, Clean Catch neg [HN]  2248 Urinalysis, Routine w reflex microscopic -Urine, Clean Catch(!) Likely a UTI. Urine culture ordered. Will treat w/ cefrriaxone, no prior culture data.  [HN]    Clinical Course User Index [HN] Franklyn Sid SAILOR, MD    Labs: I Ordered, and personally interpreted labs.  The pertinent results include:  those listed above  Additional history obtained from chart review.    Reevaluation: After the interventions noted above, I reevaluated the patient and found that they have :resolved  Social Determinants of Health: Lives independently  Disposition:  Patient's symptoms have resolved. She feels improved and tolerates PO in the department. Stable for DC.  Co morbidities that complicate the patient evaluation  Past Medical History:  Diagnosis Date   Chest pressure 11/18/2021   Crohn disease (HCC)    Diabetes mellitus    IDDM, Type 2   Essential hypertension 11/18/2021   GERD (gastroesophageal reflux disease)    no meds currently   High cholesterol    Hypertension    Mastitis    right breast   Postpartum care following cesarean delivery (2/10) 12/17/2013   Pure hypercholesterolemia 11/18/2021     Medicines Meds ordered this encounter  Medications   ondansetron  (ZOFRAN ) injection 4 mg   lactated ringers  bolus 1,000 mL   lactated ringers  bolus 1,000 mL   cefTRIAXone  (ROCEPHIN ) 1 g in  sodium chloride  0.9 % 100 mL IVPB    Antibiotic Indication::   UTI   cephALEXin  (KEFLEX ) 250 MG capsule    Sig: Take 1 capsule (250 mg total) by mouth 4 (four) times daily.    Dispense:  28 capsule    Refill:  0   ondansetron  (ZOFRAN -ODT) 4 MG disintegrating tablet    Sig: Take 1 tablet (4  mg total) by mouth every 8 (eight) hours as needed.    Dispense:  20 tablet    Refill:  0    I have reviewed the patients home medicines and have made adjustments as needed  Problem List / ED Course: Problem List Items Addressed This Visit   None Visit Diagnoses       Acute cystitis without hematuria    -  Primary     Nausea and vomiting, unspecified vomiting type                       This note was created using dictation software, which may contain spelling or grammatical errors.    Franklyn Sid SAILOR, MD 05/03/24 [phone]

## 2024-04-29 NOTE — ED Notes (Signed)
 Pt provided a couple of packs of Saltine crackers. Ambulated to the bathroom in NAD. Still reports feelings of nausea.

## 2024-04-29 NOTE — ED Triage Notes (Signed)
 Pt to ED from home with c/o N/V that started last night after getting back from cruise.

## 2024-04-29 NOTE — Discharge Instructions (Addendum)
 Thank you for coming to [facility] Emergency Department. You were seen for nausea/vomiting and abdominal discomfort. We did an exam, labs, and these showed a urinary tract infection. This could be contributing to your symptoms, or you also could have a virus caught from the cruise. Please take keflex  four times per day for 7 days. Please also use zofran  4 mg under the tongue as needed for nausea/vomiting every 6-8 hours. Please stay well hydrated.  Please follow up with your primary care provider within 1 week.   Do not hesitate to return to the ED or call 911 if you experience: -Worsening symptoms, symptoms that do not improve -Worsening abdominal pain -Lightheadedness, passing out -Nausea/vomiting so severe that you cannot eat, drink, or take your medications -Fevers/chills -Anything else that concerns you

## 2024-05-01 LAB — URINE CULTURE: Culture: 50000 — AB

## 2024-05-02 ENCOUNTER — Telehealth (HOSPITAL_BASED_OUTPATIENT_CLINIC_OR_DEPARTMENT_OTHER): Payer: Self-pay | Admitting: Emergency Medicine

## 2024-05-02 NOTE — Telephone Encounter (Signed)
 Post ED Visit - Positive Culture Follow-up  Culture report reviewed by antimicrobial stewardship pharmacist: [name] []  [name], Pharm.D. []  [name], Pharm.D., BCPS AQ-ID []  [name], Pharm.D., BCPS []  [name], Pharm.D., BCPS []  [name], [place].D., BCPS, AAHIVP []  [name], Pharm.D., BCPS, AAHIVP []  [name], PharmD, BCPS []  [name], PharmD, BCPS []  [name], PharmD, BCPS [x]  [name], PharmD []  [name], PharmD, BCPS []  [name], PharmD  [name] []  [name], PharmD []  [name], PharmD []  [name], PharmD []  [name], Rph []  [name]) [name], PharmD []  [name], PharmD []  [name], PharmD []  [name], PharmD []  [name], PharmD []  [name], PharmD []  [name], PharmD []  [name], PharmD []  [name], PharmD   Positive urine culture Treated with Cephalexin , organism sensitive to the same and no further patient follow-up is required at this time.  Cindy Long Cindy Long 05/02/2024, 8:27 AM

## 2024-05-17 ENCOUNTER — Other Ambulatory Visit (HOSPITAL_COMMUNITY): Payer: Self-pay

## 2024-05-17 DIAGNOSIS — Z794 Long term (current) use of insulin: Secondary | ICD-10-CM | POA: Diagnosis not present

## 2024-05-17 DIAGNOSIS — E663 Overweight: Secondary | ICD-10-CM | POA: Diagnosis not present

## 2024-05-17 DIAGNOSIS — I10 Essential (primary) hypertension: Secondary | ICD-10-CM | POA: Diagnosis not present

## 2024-05-17 DIAGNOSIS — E1165 Type 2 diabetes mellitus with hyperglycemia: Secondary | ICD-10-CM | POA: Diagnosis not present

## 2024-05-17 DIAGNOSIS — I2510 Atherosclerotic heart disease of native coronary artery without angina pectoris: Secondary | ICD-10-CM | POA: Diagnosis not present

## 2024-05-17 LAB — LAB REPORT - SCANNED
Albumin, Urine POC: 0.95
Creatinine, POC: 52 mg/dL
Microalb Creat Ratio: 18.4

## 2024-05-17 LAB — HEMOGLOBIN A1C: A1c: 8.7

## 2024-05-17 LAB — TSH: TSH: 1.89 (ref 0.41–5.90)

## 2024-05-17 MED ORDER — LANTUS SOLOSTAR 100 UNIT/ML ~~LOC~~ SOPN
20.0000 [IU] | PEN_INJECTOR | Freq: Every day | SUBCUTANEOUS | 4 refills | Status: AC
  Filled 2024-05-17 – 2024-07-05 (×2): qty 30, 60d supply, fill #0
  Filled 2024-07-05 – 2024-08-24 (×2): qty 15, 30d supply, fill #0
  Filled 2024-11-02 – 2024-11-24 (×2): qty 15, 30d supply, fill #1

## 2024-05-23 ENCOUNTER — Other Ambulatory Visit (HOSPITAL_COMMUNITY): Payer: Self-pay

## 2024-05-23 ENCOUNTER — Encounter: Payer: Self-pay | Admitting: Neurology

## 2024-05-23 ENCOUNTER — Ambulatory Visit: Payer: Commercial Managed Care - PPO | Admitting: Neurology

## 2024-05-23 ENCOUNTER — Other Ambulatory Visit: Payer: Self-pay

## 2024-05-23 ENCOUNTER — Other Ambulatory Visit (HOSPITAL_BASED_OUTPATIENT_CLINIC_OR_DEPARTMENT_OTHER): Payer: Self-pay

## 2024-05-23 VITALS — BP 132/74 | HR 77 | Ht 66.0 in | Wt 165.0 lb

## 2024-05-23 DIAGNOSIS — G43709 Chronic migraine without aura, not intractable, without status migrainosus: Secondary | ICD-10-CM | POA: Diagnosis not present

## 2024-05-23 MED ORDER — RIZATRIPTAN BENZOATE 10 MG PO TABS
10.0000 mg | ORAL_TABLET | Freq: Once | ORAL | 6 refills | Status: AC | PRN
  Filled 2024-05-23: qty 18, 20d supply, fill #0

## 2024-05-23 NOTE — Patient Instructions (Signed)
 Continue with Ajovy  monthly Continue with Maxalt  as needed for headaches, can combine it with Aleve  Continue to follow with PCP Please contact me if you do have worsening issues during sleep meaning falling out of bed or punching her husband Continue to follow-up PCP and return in 6 months or sooner if worse.

## 2024-05-23 NOTE — Progress Notes (Signed)
 [facility]  PATIENT: Cindy Long DOB: 1976/02/27  REQUESTING CLINICIAN: Plotnikov, Karlynn GAILS, MD HISTORY FROM: Patient  REASON FOR VISIT: Headaches    HISTORICAL  CHIEF COMPLAINT:  Chief Complaint  Patient presents with   Migraine    Rm12, alone, Migraine: pt stated that she's had 3/30 days w/migraines. Triggers: smell, light, sound, stress   INTERVAL HISTORY 05/23/2024 Patient presents today for follow-up, she is alone.  Last visit was in January at that time we submitted Ajovy  again and it was approved.  Since starting the Ajovy , she has an improvement in her migraine.  She tells me last month in June, 0 headache and the month of July, she has 3 headaches.  Maxalt  does help.  Overall there is a big improvement in her headache frequency, prior to Ajovy  she was having daily headaches. She does also complain of word finding difficulty and misplacing items but no other cognitive complaints.  Also mentioned that her husband tells her that she does speak during sleep and sometime move around but she has not fallen out of bed or punch her husband.    INTERVAL HISTORY 11/28/2023:  Patient presents today for follow-up, last visit was in July 2024.  At that time we started her on Ajovy  since she could not tolerate the Nurtec.  She tells me that she did not start the medication, unclear if it was not approved or any other reasons.  She tells me for the past month her migraine frequency and intensity has worsened with nausea and vomiting, sensitivity to light and noise.  She presented to the hospital early January, was treated with migraine cocktail.  She continues to have headache daily.  PCP has prescribed her Zavzpret  but she has not tried the medication yet.  Again in the past she has tried gabapentin , Nurtec, blood pressure medication (Metoprolol ), Lexapro   as preventive medication.  Currently she is on Maxalt  as abortive.   INTERVAL HISTORY 05/17/2023 Patient  presents for follow-up, she is accompanied by her 46-year-old daughter.  She reports her migraine frequency has worsened.  She has been having daily headaches for the past 30 days, with light sensitivity, nausea.  Gabapentin  was not helpful for headaches, Maxalt  only provide little relief.  Again as preventive she has started antidepression, antihypertensive and gabapentin  without any relief.  Will try on one of the new CGRP's.  HISTORY OF PRESENT ILLNESS:  This is a 48 year old woman past medical history of hypertension, hyperlipidemia, diabetes mellitus, history of migraine since 2018 who is presenting to establish care.  Patient reports with her migraines, she used to take Maxalt  with improvement of her symptoms, but lately her frequency has increased.  Currently she is getting about 10 plus headaches days per month.  She is not on any preventive medication.  She describes her headache as left-sided, starts at the left temple, radiates to the back, on the left side; sometimes there is left-sided neck pain.  Sometimes with the headache she does have word finding difficulty.  There are also migrainous features including photophobia, phonophobia and nausea. She denies focal neurological deficits with her migraines.    Headache History and Characteristics: Onset: 2018 Location: Start on the left, then move to the back  Quality:  Pressure  Intensity:8/10.  Duration:Lasting all day Migrainous Features: Photophobia, phonophobia, nausea Aura: No  History of brain injury or tumor: No  Family history: Grandmother and sister  Motion sickness: no Cardiac history: no  Prior prophylaxis: Propranolol: No but Metoprolol   Verapamil:No TCA: No Topamax: No Depakote: No Effexor: No but Lexapro  Cymbalta: No Neurontin :Yes  Prior abortives: Triptan: Yes, Maxalt   Anti-emetic: No Steroids: No Ergotamine suppository: No   OTHER MEDICAL CONDITIONS: Hypertension, Hyperlipidemia, DMII, Chron's  disease   REVIEW OF SYSTEMS: Full 14 system review of systems performed and negative with exception of: As noted in the HPI   ALLERGIES: Allergies  Allergen Reactions   Metronidazole Swelling    Throat swelling  Other Reaction(s): throat itching   Shellfish Allergy Swelling    Swelling of the throat  Other Reaction(s): Unknown   Empagliflozin-Linagliptin Other (See Comments)    headaches   Lisinopril Other (See Comments)   Metformin  Hcl Other (See Comments)   Prednisone  Other (See Comments)    Palpitations w/high dose   Dulaglutide Rash   Liraglutide Rash   Nurtec [Rimegepant Sulfate] Rash    she did not like how it made her feel    HOME MEDICATIONS: Outpatient Medications Prior to Visit  Medication Sig Dispense Refill   acetaminophen  (TYLENOL ) 500 MG tablet Take 1,000 mg by mouth every 6 (six) hours as needed for moderate pain or headache.     albuterol  (VENTOLIN  HFA) 108 (90 Base) MCG/ACT inhaler Inhale 1-2 puffs into the lungs every 6 (six) hours as needed for wheezing or shortness of breath. 18 g 5   aspirin  EC 81 MG tablet Take 1 tablet (81 mg total) by mouth daily. Swallow whole. 90 tablet 3   atorvastatin  (LIPITOR) 20 MG tablet Take 1 tablet (20 mg total) by mouth daily. 90 tablet 3   Bacillus Coagulans-Inulin (PROBIOTIC) 1-250 BILLION-MG CAPS Take 1 capsule by mouth daily.     Continuous Glucose Sensor (DEXCOM G7 SENSOR) MISC Change sensor every 10 days, on insulin  three times a day 9 each 4   dapagliflozin  propanediol (FARXIGA ) 10 MG TABS tablet Take 1 tablet (10 mg total) by mouth daily. 90 tablet 3   escitalopram  (LEXAPRO ) 20 MG tablet Take 2 tablets (40 mg total) by mouth daily. 180 tablet 3   fluconazole  (DIFLUCAN ) 150 MG tablet Take 1 tablet by mouth daily once as needed yeast infection--this is a prescription for 3 courses as needed 13 tablet 0   Fremanezumab -vfrm (AJOVY ) 225 MG/1.5ML SOAJ Inject 225 mg into the skin every 30 (thirty) days. 1.5 mL 11    gabapentin  (NEURONTIN ) 300 MG capsule Take 1 capsule (300 mg total) by mouth at bedtime. 90 capsule 1   HYDROcodone -acetaminophen  (NORCO/VICODIN) 5-325 MG tablet Take 1 tablet by mouth every 6 (six) hours as needed for severe pain (pain score 7-10). 20 tablet 0   hyoscyamine  (LEVBID ) 0.375 MG 12 hr tablet Take 1 tablet  by mouth as needed as directed (30 days). 30 tablet 1   hyoscyamine  (LEVBID ) 0.375 MG 12 hr tablet Take 1 tablet (0.375 mg total) by mouth every 12 (twelve) hours. 60 tablet 3   inFLIXimab  (REMICADE  IV) Inject into the vein every 8 (eight) weeks. Pt. Is unsure of dosing, but goes to have this done every 8 weeks     Insulin  Disposable Pump (OMNIPOD 5 DEXG7G6 PODS GEN 5) MISC Change pod every 2 days (total insulin  dose 100 units) 30 each 3   Insulin  Disposable Pump (OMNIPOD 5 G6 INTRO, GEN 5,) KIT Change pod every 2 days as directed (Total daily dose of 100 units) 1 kit 0   insulin  glargine (LANTUS  SOLOSTAR) 100 UNIT/ML Solostar Pen Inject 20-50 (titrate up to 50) Units into the skin daily. 30 mL 4  insulin  lispro (HUMALOG ) 100 UNIT/ML injection Inject up to 200 units per day/via insulin  pump 60 mL 11   Insulin  Pen Needle 32G X 4 MM MISC by Does not apply route. BD U/F Pen Needles Nano 4 mm x 32 G; use with insulin      Insulin  Pen Needle 32G X 4 MM MISC Use three times daily. 300 each 4   insulin  regular human CONCENTRATED (HUMULIN  R U-500 KWIKPEN) 500 UNIT/ML KwikPen Inject 5-50 Units into the skin 3 (three) times daily with meals. Take 10 units in the morning, 45 units at lunch, 25 units at dinner. (Patient taking differently: Inject 5-50 Units into the skin 3 (three) times daily with meals. Take 25 units in the morning, 50 units at lunch, 25 units at dinner.) 24 mL 3   LORazepam  (ATIVAN ) 1 MG tablet Take 0.5 tablets (0.5 mg total) by mouth daily as needed. 60 tablet 1   losartan  (COZAAR ) 50 MG tablet Take 1 tablet (50 mg total) by mouth daily. 90 tablet 3   Magnesium  250 MG TABS Take  1 tablet by mouth daily.     medroxyPROGESTERone  Acetate 150 MG/ML SUSY Inject 1 syringe into the muscle every 11-12 weeks 1 mL 11   medroxyPROGESTERone  Acetate 150 MG/ML SUSY Inject 1 mL (150 mg total) into the muscle every 3 (three) months. (every 11 - 12 weeks) 1 mL 11   metoprolol  succinate (TOPROL -XL) 25 MG 24 hr tablet Take 1 tablet (25 mg total) by mouth daily. May take additional half tablet as needed for tachycardia, palpitations. 135 tablet 3   naproxen  (NAPROSYN ) 500 MG tablet Take 1 tablet (500 mg total) by mouth 2 (two) times daily as needed for headache. 60 tablet 2   nystatin -triamcinolone  (MYCOLOG II) cream APPLY TO THE AFFECTED AREA(S) TWO TIMES DAILY AS DIRECTED 20 g 2   phentermine  (ADIPEX-P ) 37.5 MG tablet Take 1 tablet (37.5 mg total) by mouth daily before breakfast. 30 tablet 2   promethazine  (PHENERGAN ) 25 MG tablet TAKE 1 TABLET BY MOUTH EVERY 8 HOURS 90 tablet 1   Riboflavin (VITAMIN B-2 PO) Take 1 tablet by mouth daily.     spironolactone  (ALDACTONE ) 25 MG tablet Take 0.5 tablets (12.5 mg total) by mouth daily. 45 tablet 3   SYRINGE-NEEDLE, DISP, 3 ML (B-D 3CC LUER-LOK SYR 25GX5/8) 25G X 5/8 3 ML MISC Use to inject B12 under the skin 50 each 3   rizatriptan  (MAXALT ) 10 MG tablet Take 1 tablet (10 mg total) by mouth once as needed for up to 1 dose for migraine (Migraine headaches). May repeat in 2 hours if needed 18 tablet 6   rizatriptan  (MAXALT -MLT) 10 MG disintegrating tablet Take 1 tablet (10 mg total) by mouth as needed for migraine. May repeat in 2 hours if needed 10 tablet 5   cephALEXin  (KEFLEX ) 250 MG capsule Take 1 capsule (250 mg total) by mouth 4 (four) times daily. 28 capsule 0   insulin  degludec (TRESIBA  FLEXTOUCH) 200 UNIT/ML FlexTouch Pen Inject 20 Units into the skin daily. 9 mL 4   ondansetron  (ZOFRAN -ODT) 4 MG disintegrating tablet Take 1 tablet (4 mg total) by mouth every 8 (eight) hours as needed. 20 tablet 0   prochlorperazine  (COMPAZINE ) 10 MG tablet  Take 1 tablet (10 mg total) by mouth 2 (two) times daily as needed for nausea or vomiting. 10 tablet 0   tirzepatide  (MOUNJARO ) 5 MG/0.5ML Pen Inject 5 mg into the skin once a week. 2 mL 12   triamcinolone  (KENALOG )  0.1 % paste Use as directed at bedtime 5 g 1   valACYclovir  (VALTREX ) 500 MG tablet Take 1 tablet (500 mg total) by mouth 2 (two) times daily. 14 tablet 1   Zavegepant HCl (ZAVZPRET ) 10 MG/ACT SOLN Place 1 spray into the nose once as needed for up to 1 dose. 6 each 3   No facility-administered medications prior to visit.    PAST MEDICAL HISTORY: Past Medical History:  Diagnosis Date   Chest pressure 11/18/2021   Crohn disease (HCC)    Diabetes mellitus    IDDM, Type 2   Essential hypertension 11/18/2021   GERD (gastroesophageal reflux disease)    no meds currently   High cholesterol    Hypertension    Mastitis    right breast   Postpartum care following cesarean delivery (2/10) 12/17/2013   Pure hypercholesterolemia 11/18/2021    PAST SURGICAL HISTORY: Past Surgical History:  Procedure Laterality Date   CERVICAL CERCLAGE     CERVICAL CERCLAGE N/A 06/21/2013   Procedure: McDonald CERCLAGE CERVICAL;  Surgeon: Dickie DELENA Carder, MD;  Location: [facility];  Service: Gynecology;  Laterality: N/A;   CESAREAN SECTION  2008   CESAREAN SECTION N/A 12/17/2013   Procedure: Repeat CESAREAN SECTION with Cerclage Removal;  Surgeon: Dickie DELENA Carder, MD;  Location: [facility];  Service: Obstetrics;  Laterality: N/A;  EDD: 12/22/13   CHOLECYSTECTOMY     CHOLECYSTECTOMY OPEN  08/08/2011   GANGLION CYST EXCISION Right 1997   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    FAMILY HISTORY: Family History  Problem Relation Age of Onset   Hypertension Mother    Hypercholesterolemia Mother    Migraines Sister    Other Sister        brain tumor   Headache Sister    Diabetes Maternal Aunt    Migraines Maternal Grandmother    Hypertension Maternal Grandmother    Cancer  Maternal Grandmother        breast, colon   Diabetes Maternal Grandmother    Headache Maternal Grandmother    Hypertension Maternal Grandfather    Healthy Daughter    Healthy Daughter    Heart attack Cousin 36    SOCIAL HISTORY: Social History   Socioeconomic History   Marital status: Married    Spouse name: jermaine   Number of children: 2   Years of education: Not on file   Highest education level: Some college, no degree  Occupational History   Not on file  Tobacco Use   Smoking status: Never    Passive exposure: Never   Smokeless tobacco: Never  Vaping Use   Vaping status: Never Used  Substance and Sexual Activity   Alcohol use: Yes    Alcohol/week: 3.0 standard drinks of alcohol    Types: 3 Standard drinks or equivalent per week    Comment: occasionally   Drug use: No   Sexual activity: Yes    Birth control/protection: Injection  Other Topics Concern   Not on file  Social History Narrative   LPN   Social Drivers of Health   Financial Resource Strain: Low Risk  (11/18/2021)   Overall Financial Resource Strain (CARDIA)    Difficulty of Paying Living Expenses: Not hard at all  Food Insecurity: No Food Insecurity (11/18/2021)   Hunger Vital Sign    Worried About Running Out of Food in the Last Year: Never true    Ran Out of Food in the Last Year: Never true  Transportation Needs: No Transportation Needs (  11/18/2021)   PRAPARE - Administrator, Civil Service (Medical): No    Lack of Transportation (Non-Medical): No  Physical Activity: Inactive (11/18/2021)   Exercise Vital Sign    Days of Exercise per Week: 0 days    Minutes of Exercise per Session: 0 min  Stress: Not on file  Social Connections: Unknown (03/07/2022)   Received from [facility]   Social Network    Social Network: Not on file  Intimate Partner Violence: Unknown (02/07/2022)   Received from [facility]   HITS    Physically Hurt: Not on file    Insult or Talk Down To: Not on file     Threaten Physical Harm: Not on file    Scream or Curse: Not on file    PHYSICAL EXAM  GENERAL EXAM/CONSTITUTIONAL: Vitals:  Vitals:   05/23/24 1151  BP: 132/74  Pulse: 77  SpO2: 98%  Weight: 165 lb (74.8 kg)  Height: 5' 6 (1.676 m)   Body mass index is 26.63 kg/m. Wt Readings from Last 3 Encounters:  05/23/24 165 lb (74.8 kg)  04/29/24 163 lb (73.9 kg)  04/05/24 164 lb 3.2 oz (74.5 kg)   Patient is in no distress; well developed, nourished and groomed; neck is supple  MUSCULOSKELETAL: Gait, strength, tone, movements noted in Neurologic exam below  NEUROLOGIC: MENTAL STATUS:      No data to display         awake, alert, oriented to person, place and time recent and remote memory intact normal attention and concentration language fluent, comprehension intact, naming intact fund of knowledge appropriate  CRANIAL NERVE:  2nd, 3rd, 4th, 6th - pupils equal and reactive to light, visual fields full to confrontation, extraocular muscles intact, no nystagmus 5th - facial sensation symmetric 7th - facial strength symmetric 8th - hearing intact 9th - palate elevates symmetrically, uvula midline 11th - shoulder shrug symmetric 12th - tongue protrusion midline  MOTOR:  normal bulk and tone, full strength in the BUE, BLE  SENSORY:  normal and symmetric to light touch  COORDINATION:  finger-nose-finger, fine finger movements normal  GAIT/STATION:  normal   DIAGNOSTIC DATA (LABS, IMAGING, TESTING) - I reviewed patient records, labs, notes, testing and imaging myself where available.  Lab Results  Component Value Date   WBC 9.4 04/29/2024   HGB 15.9 (H) 04/29/2024   HCT 48.4 (H) 04/29/2024   MCV 81.3 04/29/2024   PLT 254 04/29/2024      Component Value Date/Time   NA 140 04/29/2024 2039   NA 140 11/18/2021 1605   K 3.6 04/29/2024 2039   CL 101 04/29/2024 2039   CO2 23 04/29/2024 2039   GLUCOSE 214 (H) 04/29/2024 2039   BUN 8 04/29/2024 2039    BUN 9 11/18/2021 1605   CREATININE 0.70 04/29/2024 2039   CALCIUM  9.7 04/29/2024 2039   PROT 8.1 04/29/2024 2039   ALBUMIN 4.3 04/29/2024 2039   AST 21 04/29/2024 2039   ALT 23 04/29/2024 2039   ALKPHOS 68 04/29/2024 2039   BILITOT 1.2 04/29/2024 2039   GFRNONAA >60 04/29/2024 2039   GFRAA >60 11/12/2019 1235   Lab Results  Component Value Date   CHOL 116 05/24/2023   HDL 39.70 05/24/2023   LDLCALC 51 05/24/2023   TRIG 125.0 05/24/2023   CHOLHDL 3 05/24/2023   Lab Results  Component Value Date   HGBA1C 7.2 (H) 05/24/2023   Lab Results  Component Value Date   VITAMINB12 206 (L) 07/16/2021  Lab Results  Component Value Date   TSH 2.87 05/24/2023    MRI Brain 02/28/2023 1. No acute intracranial process. No specific etiology for headaches identified. 2. Air-fluid level in the right maxillary sinus, which can be seen in the setting of acute sinusitis. 3. On the post contrast-enhanced sequences there appears to be focal narrowing in the distal right M1. This may be artifactual. Consider further evaluation with an MRA for more definitive characterization in the exclude possibility of vascular stenosis.    ASSESSMENT AND PLAN  48 y.o. year old female with hypertension, hyperlipidemia, diabetes mellitus type 2, chron's disease who is presenting for management of her migraines.  She is doing well since starting Ajovy , 3 headaches this month of July and 0 headache during June.  Plan will be for patient to continue with Ajovy  and to continue with Maxalt  as needed as abortive.  I have also advised her to take Maxalt  with Aleve  together.  She was not able to get the Zavzpret . Continue to follow with PCP and I will see her in 45-months for follow-up or sooner if worse.   1. Chronic migraine without aura without status migrainosus, not intractable      Patient Instructions  Continue with Ajovy  monthly Continue with Maxalt  as needed for headaches, can combine it with Aleve  Continue  to follow with PCP Please contact me if you do have worsening issues during sleep meaning falling out of bed or punching her husband Continue to follow-up PCP and return in 6 months or sooner if worse.   No orders of the defined types were placed in this encounter.   Meds ordered this encounter  Medications   rizatriptan  (MAXALT ) 10 MG tablet    Sig: Take 1 tablet (10 mg total) by mouth once as needed for up to 1 dose for migraine (Migraine headaches). May repeat in 2 hours if needed    Dispense:  18 tablet    Refill:  6    Return in about 6 months (around 11/23/2024).    Pastor Falling, MD 05/23/2024, 12:56 PM  [facility] [address] [place], [place] [address] [phone]

## 2024-05-27 ENCOUNTER — Other Ambulatory Visit (HOSPITAL_COMMUNITY): Payer: Self-pay

## 2024-05-27 ENCOUNTER — Other Ambulatory Visit (HOSPITAL_BASED_OUTPATIENT_CLINIC_OR_DEPARTMENT_OTHER): Payer: Self-pay | Admitting: Family

## 2024-05-27 DIAGNOSIS — I25118 Atherosclerotic heart disease of native coronary artery with other forms of angina pectoris: Secondary | ICD-10-CM

## 2024-05-29 ENCOUNTER — Other Ambulatory Visit (HOSPITAL_COMMUNITY): Payer: Self-pay

## 2024-05-29 MED ORDER — ATORVASTATIN CALCIUM 20 MG PO TABS
20.0000 mg | ORAL_TABLET | Freq: Every day | ORAL | 3 refills | Status: AC
  Filled 2024-05-29: qty 90, 90d supply, fill #0

## 2024-05-30 ENCOUNTER — Other Ambulatory Visit (HOSPITAL_BASED_OUTPATIENT_CLINIC_OR_DEPARTMENT_OTHER): Payer: Self-pay

## 2024-05-30 ENCOUNTER — Ambulatory Visit (INDEPENDENT_AMBULATORY_CARE_PROVIDER_SITE_OTHER): Admitting: Internal Medicine

## 2024-05-30 ENCOUNTER — Encounter: Payer: Self-pay | Admitting: Gastroenterology

## 2024-05-30 ENCOUNTER — Other Ambulatory Visit (HOSPITAL_COMMUNITY): Payer: Self-pay

## 2024-05-30 ENCOUNTER — Encounter: Payer: Self-pay | Admitting: Internal Medicine

## 2024-05-30 VITALS — BP 126/68 | HR 86 | Temp 98.4°F | Ht 66.0 in | Wt 166.0 lb

## 2024-05-30 DIAGNOSIS — Z0000 Encounter for general adult medical examination without abnormal findings: Secondary | ICD-10-CM

## 2024-05-30 DIAGNOSIS — K50119 Crohn's disease of large intestine with unspecified complications: Secondary | ICD-10-CM | POA: Diagnosis not present

## 2024-05-30 DIAGNOSIS — E1065 Type 1 diabetes mellitus with hyperglycemia: Secondary | ICD-10-CM | POA: Diagnosis not present

## 2024-05-30 DIAGNOSIS — E538 Deficiency of other specified B group vitamins: Secondary | ICD-10-CM | POA: Diagnosis not present

## 2024-05-30 MED ORDER — FLUCONAZOLE 150 MG PO TABS
ORAL_TABLET | ORAL | 0 refills | Status: AC
  Filled 2024-05-30: qty 13, 13d supply, fill #0

## 2024-05-30 MED ORDER — SPIRONOLACTONE 25 MG PO TABS
12.5000 mg | ORAL_TABLET | Freq: Every day | ORAL | 3 refills | Status: AC
  Filled 2024-05-30: qty 45, 90d supply, fill #0
  Filled 2024-08-12 – 2024-08-22 (×2): qty 45, 90d supply, fill #1
  Filled 2024-11-24: qty 45, 90d supply, fill #2

## 2024-05-30 MED ORDER — BD LUER-LOK SYRINGE 25G X 5/8" 3 ML MISC
3 refills | Status: AC
  Filled 2024-05-30: qty 50, 50d supply, fill #0

## 2024-05-30 MED ORDER — PHENTERMINE HCL 37.5 MG PO TABS
37.5000 mg | ORAL_TABLET | Freq: Every day | ORAL | 2 refills | Status: DC
  Filled 2024-05-30: qty 30, 30d supply, fill #0
  Filled 2024-07-05: qty 30, 30d supply, fill #1
  Filled 2024-08-12 – 2024-08-22 (×2): qty 30, 30d supply, fill #2

## 2024-05-30 MED ORDER — LOSARTAN POTASSIUM 50 MG PO TABS
50.0000 mg | ORAL_TABLET | Freq: Every day | ORAL | 3 refills | Status: AC
  Filled 2024-05-30 – 2024-08-24 (×2): qty 90, 90d supply, fill #0
  Filled 2024-12-06: qty 90, 90d supply, fill #1

## 2024-05-30 MED ORDER — DAPAGLIFLOZIN PROPANEDIOL 10 MG PO TABS
10.0000 mg | ORAL_TABLET | Freq: Every day | ORAL | 3 refills | Status: AC
  Filled 2024-05-30 – 2024-06-14 (×2): qty 90, 90d supply, fill #0
  Filled 2024-10-11: qty 90, 90d supply, fill #1
  Filled 2024-10-12: qty 90, 90d supply, fill #0

## 2024-05-30 MED ORDER — ESCITALOPRAM OXALATE 20 MG PO TABS
40.0000 mg | ORAL_TABLET | Freq: Every day | ORAL | 3 refills | Status: AC
  Filled 2024-05-30: qty 180, 90d supply, fill #0

## 2024-05-30 MED ORDER — NAPROXEN 500 MG PO TABS
500.0000 mg | ORAL_TABLET | Freq: Two times a day (BID) | ORAL | 2 refills | Status: AC | PRN
  Filled 2024-05-30: qty 60, 30d supply, fill #0

## 2024-05-30 NOTE — Assessment & Plan Note (Signed)
Continue on vitamin B12

## 2024-05-30 NOTE — Progress Notes (Signed)
 Subjective:  Patient ID: Cindy Long, female    DOB: March 05, 1976  Age: 48 y.o. MRN: [id number]  CC: Annual Exam (No concerns )   HPI Cindy Long presents for well exam On a pump  Outpatient Medications Prior to Visit  Medication Sig Dispense Refill   acetaminophen  (TYLENOL ) 500 MG tablet Take 1,000 mg by mouth every 6 (six) hours as needed for moderate pain or headache.     albuterol  (VENTOLIN  HFA) 108 (90 Base) MCG/ACT inhaler Inhale 1-2 puffs into the lungs every 6 (six) hours as needed for wheezing or shortness of breath. 18 g 5   aspirin  EC 81 MG tablet Take 1 tablet (81 mg total) by mouth daily. Swallow whole. 90 tablet 3   atorvastatin  (LIPITOR) 20 MG tablet Take 1 tablet (20 mg total) by mouth daily. 90 tablet 3   Bacillus Coagulans-Inulin (PROBIOTIC) 1-250 BILLION-MG CAPS Take 1 capsule by mouth daily.     Fremanezumab -vfrm (AJOVY ) 225 MG/1.5ML SOAJ Inject 225 mg into the skin every 30 (thirty) days. 1.5 mL 11   gabapentin  (NEURONTIN ) 300 MG capsule Take 1 capsule (300 mg total) by mouth at bedtime. 90 capsule 1   HYDROcodone -acetaminophen  (NORCO/VICODIN) 5-325 MG tablet Take 1 tablet by mouth every 6 (six) hours as needed for severe pain (pain score 7-10). 20 tablet 0   hyoscyamine  (LEVBID ) 0.375 MG 12 hr tablet Take 1 tablet  by mouth as needed as directed (30 days). 30 tablet 1   hyoscyamine  (LEVBID ) 0.375 MG 12 hr tablet Take 1 tablet (0.375 mg total) by mouth every 12 (twelve) hours. 60 tablet 3   inFLIXimab  (REMICADE  IV) Inject into the vein every 8 (eight) weeks. Pt. Is unsure of dosing, but goes to have this done every 8 weeks     Insulin  Disposable Pump (OMNIPOD 5 [id number] PODS GEN 5) MISC Change pod every 2 days (total insulin  dose 100 units) 30 each 3   Insulin  Disposable Pump (OMNIPOD 5 G6 INTRO, GEN 5,) KIT Change pod every 2 days as directed (Total daily dose of 100 units) 1 kit 0   insulin  glargine (LANTUS  SOLOSTAR) 100 UNIT/ML Solostar Pen Inject 20-50  (titrate up to 50) Units into the skin daily. 30 mL 4   insulin  lispro (HUMALOG ) 100 UNIT/ML injection Inject up to 200 units per day/via insulin  pump 60 mL 11   Insulin  Pen Needle 32G X 4 MM MISC by Does not apply route. BD U/F Pen Needles Nano 4 mm x 32 G; use with insulin      Insulin  Pen Needle 32G X 4 MM MISC Use three times daily. 300 each 4   insulin  regular human CONCENTRATED (HUMULIN  R U-500 KWIKPEN) 500 UNIT/ML KwikPen Inject 5-50 Units into the skin 3 (three) times daily with meals. Take 10 units in the morning, 45 units at lunch, 25 units at dinner. (Patient taking differently: Inject 5-50 Units into the skin 3 (three) times daily with meals. Take 25 units in the morning, 50 units at lunch, 25 units at dinner.) 24 mL 3   LORazepam  (ATIVAN ) 1 MG tablet Take 0.5 tablets (0.5 mg total) by mouth daily as needed. 60 tablet 1   Magnesium  250 MG TABS Take 1 tablet by mouth daily.     medroxyPROGESTERone  Acetate 150 MG/ML SUSY Inject 1 syringe into the muscle every 11-12 weeks 1 mL 11   medroxyPROGESTERone  Acetate 150 MG/ML SUSY Inject 1 mL (150 mg total) into the muscle every 3 (three) months. (every 11 -  12 weeks) 1 mL 11   metoprolol  succinate (TOPROL -XL) 25 MG 24 hr tablet Take 1 tablet (25 mg total) by mouth daily. May take additional half tablet as needed for tachycardia, palpitations. 135 tablet 3   nystatin -triamcinolone  (MYCOLOG II) cream APPLY TO THE AFFECTED AREA(S) TWO TIMES DAILY AS DIRECTED 20 g 2   promethazine  (PHENERGAN ) 25 MG tablet TAKE 1 TABLET BY MOUTH EVERY 8 HOURS 90 tablet 1   Riboflavin (VITAMIN B-2 PO) Take 1 tablet by mouth daily.     rizatriptan  (MAXALT ) 10 MG tablet Take 1 tablet (10 mg total) by mouth once as needed for up to 1 dose for migraine (Migraine headaches). May repeat in 2 hours if needed 18 tablet 6   Continuous Glucose Sensor (DEXCOM G7 SENSOR) MISC Change sensor every 10 days, on insulin  three times a day 9 each 4   dapagliflozin  propanediol (FARXIGA ) 10  MG TABS tablet Take 1 tablet (10 mg total) by mouth daily. 90 tablet 3   escitalopram  (LEXAPRO ) 20 MG tablet Take 2 tablets (40 mg total) by mouth daily. 180 tablet 3   fluconazole  (DIFLUCAN ) 150 MG tablet Take 1 tablet by mouth daily once as needed yeast infection--this is a prescription for 3 courses as needed 13 tablet 0   losartan  (COZAAR ) 50 MG tablet Take 1 tablet (50 mg total) by mouth daily. 90 tablet 3   naproxen  (NAPROSYN ) 500 MG tablet Take 1 tablet (500 mg total) by mouth 2 (two) times daily as needed for headache. 60 tablet 2   phentermine  (ADIPEX-P ) 37.5 MG tablet Take 1 tablet (37.5 mg total) by mouth daily before breakfast. 30 tablet 2   spironolactone  (ALDACTONE ) 25 MG tablet Take 0.5 tablets (12.5 mg total) by mouth daily. 45 tablet 3   SYRINGE-NEEDLE, DISP, 3 ML (B-D 3CC LUER-LOK SYR 25GX5/8) 25G X 5/8 3 ML MISC Use to inject B12 under the skin 50 each 3   No facility-administered medications prior to visit.    ROS: Review of Systems  Constitutional:  Negative for activity change, appetite change, chills, fatigue and unexpected weight change.  HENT:  Negative for congestion, mouth sores and sinus pressure.   Eyes:  Negative for visual disturbance.  Respiratory:  Negative for cough and chest tightness.   Gastrointestinal:  Negative for abdominal pain and nausea.  Genitourinary:  Negative for difficulty urinating, frequency and vaginal pain.  Musculoskeletal:  Negative for back pain and gait problem.  Skin:  Negative for pallor and rash.  Neurological:  Positive for headaches. Negative for dizziness, tremors, weakness and numbness.  Hematological:  Does not bruise/bleed easily.  Psychiatric/Behavioral:  Negative for confusion and sleep disturbance.     Objective:  BP 126/68 (BP Location: Left Arm, Patient Position: Sitting, Cuff Size: Normal)   Pulse 86   Temp 98.4 F (36.9 C) (Oral)   Ht 5' 6 (1.676 m)   Wt 166 lb (75.3 kg)   BMI 26.79 kg/m   BP Readings from  Last 3 Encounters:  05/31/24 126/77  05/30/24 126/68  05/23/24 132/74    Wt Readings from Last 3 Encounters:  05/31/24 166 lb 12.8 oz (75.7 kg)  05/30/24 166 lb (75.3 kg)  05/23/24 165 lb (74.8 kg)    Physical Exam Constitutional:      General: She is not in acute distress.    Appearance: She is well-developed. She is obese.  HENT:     Head: Normocephalic.     Right Ear: External ear normal.  Left Ear: External ear normal.     Nose: Nose normal.  Eyes:     General:        Right eye: No discharge.        Left eye: No discharge.     Conjunctiva/sclera: Conjunctivae normal.     Pupils: Pupils are equal, round, and reactive to light.  Neck:     Thyroid : No thyromegaly.     Vascular: No JVD.     Trachea: No tracheal deviation.  Cardiovascular:     Rate and Rhythm: Normal rate and regular rhythm.     Heart sounds: Normal heart sounds.  Pulmonary:     Effort: No respiratory distress.     Breath sounds: No stridor. No wheezing.  Abdominal:     General: Bowel sounds are normal. There is no distension.     Palpations: Abdomen is soft. There is no mass.     Tenderness: There is no abdominal tenderness. There is no guarding or rebound.  Musculoskeletal:        General: No tenderness.     Cervical back: Normal range of motion and neck supple. No rigidity.     Right lower leg: No edema.     Left lower leg: No edema.  Lymphadenopathy:     Cervical: No cervical adenopathy.  Skin:    Findings: No erythema or rash.  Neurological:     Cranial Nerves: No cranial nerve deficit.     Motor: No abnormal muscle tone.     Coordination: Coordination normal.     Deep Tendon Reflexes: Reflexes normal.  Psychiatric:        Behavior: Behavior normal.        Thought Content: Thought content normal.        Judgment: Judgment normal.     Lab Results  Component Value Date   WBC 9.9 05/30/2024   HGB 14.9 05/30/2024   HCT 45.5 05/30/2024   PLT 243.0 05/30/2024   GLUCOSE 183 (H)  05/30/2024   CHOL 116 05/24/2023   TRIG 125.0 05/24/2023   HDL 39.70 05/24/2023   LDLCALC 51 05/24/2023   ALT 19 05/30/2024   AST 15 05/30/2024   NA 138 05/30/2024   K 4.1 05/30/2024   CL 104 05/30/2024   CREATININE 0.65 05/30/2024   BUN 9 05/30/2024   CO2 26 05/30/2024   TSH 1.89 05/17/2024   HGBA1C 7.2 (H) 05/24/2023    No results found.  Assessment & Plan:   Problem List Items Addressed This Visit     B12 deficiency - Primary   Continue on vitamin B12      Relevant Orders   Vitamin B12 (Completed)   Crohn's disease of colon with complication (HCC)   Labs      Relevant Orders   Comprehensive metabolic panel with GFR (Completed)   Vitamin B12 (Completed)   VITAMIN D  25 Hydroxy (Vit-D Deficiency, Fractures) (Completed)   CBC with Differential/Platelet (Completed)   Diabetes mellitus type 1, controlled (HCC)   On the insulin  pump      Relevant Medications   losartan  (COZAAR ) 50 MG tablet   dapagliflozin  propanediol (FARXIGA ) 10 MG TABS tablet   Well adult exam   We discussed age appropriate health related issues, including available/recomended screening tests and vaccinations. Labs were ordered to be later reviewed . All questions were answered. We discussed one or more of the following - seat belt use, use of sunscreen/sun exposure exercise, second hand smoke exposure, firearm use and  storage, seat belt use, a need for adhering to healthy diet and exercise. Labs were ordered.  All questions were answered. Pneumonia injection          Meds ordered this encounter  Medications   fluconazole  (DIFLUCAN ) 150 MG tablet    Sig: Take 1 tablet by mouth daily once as needed yeast infection--this is a prescription for 3 courses as needed    Dispense:  13 tablet    Refill:  0    This is a prescription for 3 as needed courses.   phentermine  (ADIPEX-P ) 37.5 MG tablet    Sig: Take 1 tablet (37.5 mg total) by mouth daily before breakfast.    Dispense:  30 tablet     Refill:  2   losartan  (COZAAR ) 50 MG tablet    Sig: Take 1 tablet (50 mg total) by mouth daily.    Dispense:  90 tablet    Refill:  3   spironolactone  (ALDACTONE ) 25 MG tablet    Sig: Take 0.5 tablets (12.5 mg total) by mouth daily.    Dispense:  45 tablet    Refill:  3   naproxen  (NAPROSYN ) 500 MG tablet    Sig: Take 1 tablet (500 mg total) by mouth 2 (two) times daily as needed for headache.    Dispense:  60 tablet    Refill:  2   dapagliflozin  propanediol (FARXIGA ) 10 MG TABS tablet    Sig: Take 1 tablet (10 mg total) by mouth daily.    Dispense:  90 tablet    Refill:  3   escitalopram  (LEXAPRO ) 20 MG tablet    Sig: Take 2 tablets (40 mg total) by mouth daily.    Dispense:  180 tablet    Refill:  3   SYRINGE-NEEDLE, DISP, 3 ML (B-D 3CC LUER-LOK SYR 25GX5/8) 25G X 5/8 3 ML MISC    Sig: Use to inject B12 under the skin    Dispense:  50 each    Refill:  3      Follow-up: Return in about 6 months (around 11/30/2024) for a follow-up visit.  Marolyn Noel, MD

## 2024-05-30 NOTE — Assessment & Plan Note (Signed)
 Labs

## 2024-05-31 ENCOUNTER — Other Ambulatory Visit (HOSPITAL_COMMUNITY): Payer: Self-pay

## 2024-05-31 ENCOUNTER — Ambulatory Visit

## 2024-05-31 VITALS — BP 126/77 | HR 80 | Temp 98.7°F | Resp 16 | Ht 66.0 in | Wt 166.8 lb

## 2024-05-31 DIAGNOSIS — K50919 Crohn's disease, unspecified, with unspecified complications: Secondary | ICD-10-CM

## 2024-05-31 LAB — CBC WITH DIFFERENTIAL/PLATELET
Basophils Absolute: 0.1 K/uL (ref 0.0–0.1)
Basophils Relative: 0.5 % (ref 0.0–3.0)
Eosinophils Absolute: 0.1 K/uL (ref 0.0–0.7)
Eosinophils Relative: 0.6 % (ref 0.0–5.0)
HCT: 45.5 % (ref 36.0–46.0)
Hemoglobin: 14.9 g/dL (ref 12.0–15.0)
Lymphocytes Relative: 37.5 % (ref 12.0–46.0)
Lymphs Abs: 3.7 K/uL (ref 0.7–4.0)
MCHC: 32.8 g/dL (ref 30.0–36.0)
MCV: 81.1 fl (ref 78.0–100.0)
Monocytes Absolute: 0.5 K/uL (ref 0.1–1.0)
Monocytes Relative: 4.7 % (ref 3.0–12.0)
Neutro Abs: 5.6 K/uL (ref 1.4–7.7)
Neutrophils Relative %: 56.7 % (ref 43.0–77.0)
Platelets: 243 K/uL (ref 150.0–400.0)
RBC: 5.61 Mil/uL — ABNORMAL HIGH (ref 3.87–5.11)
RDW: 14.8 % (ref 11.5–15.5)
WBC: 9.9 K/uL (ref 4.0–10.5)

## 2024-05-31 LAB — COMPREHENSIVE METABOLIC PANEL WITH GFR
ALT: 19 U/L (ref 0–35)
AST: 15 U/L (ref 0–37)
Albumin: 4.3 g/dL (ref 3.5–5.2)
Alkaline Phosphatase: 69 U/L (ref 39–117)
BUN: 9 mg/dL (ref 6–23)
CO2: 26 meq/L (ref 19–32)
Calcium: 9.5 mg/dL (ref 8.4–10.5)
Chloride: 104 meq/L (ref 96–112)
Creatinine, Ser: 0.65 mg/dL (ref 0.40–1.20)
GFR: 105.68 mL/min (ref >60.00)
Glucose, Bld: 183 mg/dL — ABNORMAL HIGH (ref 70–99)
Potassium: 4.1 meq/L (ref 3.5–5.1)
Sodium: 138 meq/L (ref 135–145)
Total Bilirubin: 1 mg/dL (ref 0.2–1.2)
Total Protein: 7.4 g/dL (ref 6.0–8.3)

## 2024-05-31 LAB — VITAMIN B12: Vitamin B-12: 1240 pg/mL — ABNORMAL HIGH (ref 211–911)

## 2024-05-31 LAB — VITAMIN D 25 HYDROXY (VIT D DEFICIENCY, FRACTURES): VITD: 34.1 ng/mL (ref 30.00–100.00)

## 2024-05-31 MED ORDER — ACETAMINOPHEN 325 MG PO TABS
650.0000 mg | ORAL_TABLET | Freq: Once | ORAL | Status: AC
  Administered 2024-05-31: 650 mg via ORAL
  Filled 2024-05-31: qty 2

## 2024-05-31 MED ORDER — METHYLPREDNISOLONE SODIUM SUCC 40 MG IJ SOLR
40.0000 mg | Freq: Once | INTRAMUSCULAR | Status: AC
Start: 2024-05-31 — End: 2024-05-31
  Administered 2024-05-31: 40 mg via INTRAVENOUS
  Filled 2024-05-31: qty 1

## 2024-05-31 MED ORDER — SODIUM CHLORIDE 0.9 % IV SOLN
5.0000 mg/kg | Freq: Once | INTRAVENOUS | Status: AC
  Administered 2024-05-31: 400 mg via INTRAVENOUS
  Filled 2024-05-31: qty 40

## 2024-05-31 MED ORDER — DIPHENHYDRAMINE HCL 25 MG PO CAPS
25.0000 mg | ORAL_CAPSULE | Freq: Once | ORAL | Status: AC
  Administered 2024-05-31: 25 mg via ORAL
  Filled 2024-05-31: qty 1

## 2024-05-31 NOTE — Progress Notes (Signed)
 Diagnosis: Crohn's Disease  Provider:  Praveen Mannam MD  Procedure: IV Infusion  IV Type: Peripheral, IV Location: L Antecubital  Avsola  (infliximab -axxq), Dose: 400 mg  Infusion Start Time: 1007  Infusion Stop Time: 1219  Post Infusion IV Care: Peripheral IV Discontinued  Discharge: Condition: Good, Destination: Home . AVS Declined  Performed by:  Donny Childes, RN

## 2024-06-02 ENCOUNTER — Ambulatory Visit: Payer: Self-pay | Admitting: Internal Medicine

## 2024-06-04 ENCOUNTER — Other Ambulatory Visit (HOSPITAL_COMMUNITY): Payer: Self-pay

## 2024-06-04 ENCOUNTER — Other Ambulatory Visit (HOSPITAL_BASED_OUTPATIENT_CLINIC_OR_DEPARTMENT_OTHER): Payer: Self-pay

## 2024-06-04 DIAGNOSIS — Z01419 Encounter for gynecological examination (general) (routine) without abnormal findings: Secondary | ICD-10-CM | POA: Diagnosis not present

## 2024-06-04 DIAGNOSIS — E785 Hyperlipidemia, unspecified: Secondary | ICD-10-CM | POA: Diagnosis not present

## 2024-06-04 DIAGNOSIS — I10 Essential (primary) hypertension: Secondary | ICD-10-CM | POA: Diagnosis not present

## 2024-06-04 DIAGNOSIS — Z309 Encounter for contraceptive management, unspecified: Secondary | ICD-10-CM | POA: Diagnosis not present

## 2024-06-04 DIAGNOSIS — E119 Type 2 diabetes mellitus without complications: Secondary | ICD-10-CM | POA: Diagnosis not present

## 2024-06-04 DIAGNOSIS — K5090 Crohn's disease, unspecified, without complications: Secondary | ICD-10-CM | POA: Diagnosis not present

## 2024-06-04 MED ORDER — MEDROXYPROGESTERONE ACETATE 150 MG/ML IM SUSY
PREFILLED_SYRINGE | INTRAMUSCULAR | 11 refills | Status: AC
  Filled 2024-06-04 – 2024-06-14 (×2): qty 1, 90d supply, fill #0
  Filled 2024-09-18: qty 1, 90d supply, fill #1
  Filled 2024-12-06: qty 1, 90d supply, fill #2

## 2024-06-05 ENCOUNTER — Ambulatory Visit: Payer: Self-pay | Admitting: Internal Medicine

## 2024-06-10 ENCOUNTER — Other Ambulatory Visit (HOSPITAL_COMMUNITY): Payer: Self-pay

## 2024-06-14 ENCOUNTER — Other Ambulatory Visit (HOSPITAL_COMMUNITY): Payer: Self-pay

## 2024-06-14 ENCOUNTER — Other Ambulatory Visit: Payer: Self-pay

## 2024-06-14 ENCOUNTER — Encounter: Payer: Self-pay | Admitting: Gastroenterology

## 2024-06-14 MED ORDER — DEXCOM G7 SENSOR MISC
1.0000 | 4 refills | Status: AC
  Filled 2024-06-14: qty 9, 90d supply, fill #0
  Filled 2024-06-21: qty 3, 30d supply, fill #0
  Filled 2024-08-12: qty 3, 30d supply, fill #1
  Filled 2024-09-27: qty 3, 30d supply, fill #2
  Filled 2024-11-07 – 2024-11-24 (×2): qty 3, 30d supply, fill #3

## 2024-06-17 DIAGNOSIS — E109 Type 1 diabetes mellitus without complications: Secondary | ICD-10-CM | POA: Insufficient documentation

## 2024-06-17 NOTE — Assessment & Plan Note (Signed)
 On the insulin  pump

## 2024-06-17 NOTE — Assessment & Plan Note (Signed)
 We discussed age appropriate health related issues, including available/recomended screening tests and vaccinations. Labs were ordered to be later reviewed . All questions were answered. We discussed one or more of the following - seat belt use, use of sunscreen/sun exposure exercise, second hand smoke exposure, firearm use and storage, seat belt use, a need for adhering to healthy diet and exercise. Labs were ordered.  All questions were answered. Pneumonia injection

## 2024-06-21 ENCOUNTER — Other Ambulatory Visit (HOSPITAL_COMMUNITY): Payer: Self-pay

## 2024-06-28 DIAGNOSIS — Z3042 Encounter for surveillance of injectable contraceptive: Secondary | ICD-10-CM | POA: Diagnosis not present

## 2024-07-05 ENCOUNTER — Encounter: Payer: Self-pay | Admitting: Gastroenterology

## 2024-07-05 ENCOUNTER — Other Ambulatory Visit: Payer: Self-pay

## 2024-07-05 ENCOUNTER — Other Ambulatory Visit (HOSPITAL_COMMUNITY): Payer: Self-pay

## 2024-07-14 ENCOUNTER — Other Ambulatory Visit (HOSPITAL_COMMUNITY): Payer: Self-pay

## 2024-07-18 NOTE — Progress Notes (Signed)
 "  Acute Office Visit  Subjective:     Patient ID: Cindy Long, female    DOB: 20-Nov-1975, 48 y.o.   MRN: [id number]  Chief Complaint  Patient presents with   Acute Visit    HPI  Discussed the use of AI scribe software for clinical note transcription with the patient, who gave verbal consent to proceed.  History of Present Illness Cindy Long is a 48 year old female who presents with bladder spasms and urinary discomfort.  Bladder spasms and urinary discomfort - Recurrent bladder spasms and urinary discomfort - Previously treated with Myrbetriq or oxybutynin , but uncertain which medication was used - No recollection of side effects such as urine discoloration from prior medications - No prior use of muscle relaxers for bladder spasms - No current fever, chills, or hematuria     ROS Per HPI      Objective:    BP 130/82 (BP Location: Left Arm, Patient Position: Sitting)   Pulse 99   Temp 97.8 F (36.6 C) (Temporal)   Ht 5' 6 (1.676 m)   Wt 160 lb (72.6 kg)   SpO2 96%   BMI 25.82 kg/m    Physical Exam Vitals and nursing note reviewed.  Constitutional:      General: She is not in acute distress.    Appearance: Normal appearance. She is normal weight.  HENT:     Head: Normocephalic and atraumatic.     Right Ear: External ear normal.     Left Ear: External ear normal.  Eyes:     Extraocular Movements: Extraocular movements intact.  Cardiovascular:     Rate and Rhythm: Normal rate and regular rhythm.     Pulses: Normal pulses.     Heart sounds: Normal heart sounds.  Pulmonary:     Effort: Pulmonary effort is normal. No respiratory distress.     Breath sounds: Normal breath sounds. No wheezing, rhonchi or rales.  Abdominal:     General: There is no distension.     Palpations: There is no mass.     Tenderness: There is abdominal tenderness (suprapubic). There is no guarding or rebound.     Hernia: No hernia is present.  Musculoskeletal:         General: Normal range of motion.     Cervical back: Normal range of motion.     Right lower leg: No edema.     Left lower leg: No edema.  Lymphadenopathy:     Cervical: No cervical adenopathy.  Neurological:     General: No focal deficit present.     Mental Status: She is alert and oriented to person, place, and time.  Psychiatric:        Mood and Affect: Mood normal.        Thought Content: Thought content normal.     Results for orders placed or performed in visit on 07/19/24  POCT URINALYSIS DIP (CLINITEK)  Result Value Ref Range   Color, UA yellow yellow   Clarity, UA clear clear   Glucose, UA >=1,000 (A) negative mg/dL   Bilirubin, UA negative negative   Ketones, POC UA negative negative mg/dL   Spec Grav, UA 8.989 8.989 - 1.025   Blood, UA negative negative   pH, UA 6.0 5.0 - 8.0   POC PROTEIN,UA negative negative, trace   Urobilinogen, UA 0.2 0.2 or 1.0 E.U./dL   Nitrite, UA Negative Negative   Leukocytes, UA Negative Negative  Assessment & Plan:   Assessment and Plan Assessment & Plan Dysuria Recurrent dysuria without hematuria, fever, or chills. - Order urine culture. - Prescribe oxybutynin  due to insurance coverage.  Bladder Spasm - Will trial oxybutynin  since this has helped in the past  Immunization Due Has not received a flu shot this season. - Administer flu shot.     Orders Placed This Encounter  Procedures   Urine Culture    Standing Status:   Future    Expiration Date:   08/17/2024   Flu vaccine trivalent PF, 6mos and older(Flulaval ,Afluria,Fluarix,Fluzone )   POCT URINALYSIS DIP (CLINITEK)     Meds ordered this encounter  Medications   oxybutynin  (DITROPAN -XL) 10 MG 24 hr tablet    Sig: Take 1 tablet (10 mg total) by mouth at bedtime.    Dispense:  30 tablet    Refill:  1    Return if symptoms worsen or fail to improve.  Corean LITTIE Ku, FNP  "

## 2024-07-19 ENCOUNTER — Other Ambulatory Visit (HOSPITAL_COMMUNITY): Payer: Self-pay

## 2024-07-19 ENCOUNTER — Encounter: Payer: Self-pay | Admitting: Family Medicine

## 2024-07-19 ENCOUNTER — Ambulatory Visit: Admitting: Family Medicine

## 2024-07-19 VITALS — BP 130/82 | HR 99 | Temp 97.8°F | Ht 66.0 in | Wt 160.0 lb

## 2024-07-19 DIAGNOSIS — R300 Dysuria: Secondary | ICD-10-CM

## 2024-07-19 DIAGNOSIS — N3289 Other specified disorders of bladder: Secondary | ICD-10-CM | POA: Diagnosis not present

## 2024-07-19 DIAGNOSIS — Z23 Encounter for immunization: Secondary | ICD-10-CM | POA: Diagnosis not present

## 2024-07-19 LAB — POCT URINALYSIS DIP (CLINITEK)
Bilirubin, UA: NEGATIVE
Blood, UA: NEGATIVE
Glucose, UA: >=1000 mg/dL — AB
Ketones, POC UA: NEGATIVE mg/dL
Leukocytes, UA: NEGATIVE
Nitrite, UA: NEGATIVE
POC PROTEIN,UA: NEGATIVE
Spec Grav, UA: 1.01 (ref 1.010–1.025)
Urobilinogen, UA: 0.2 U/dL
pH, UA: 6 (ref 5.0–8.0)

## 2024-07-19 MED ORDER — OXYBUTYNIN CHLORIDE ER 10 MG PO TB24
10.0000 mg | ORAL_TABLET | Freq: Every day | ORAL | 1 refills | Status: DC
  Filled 2024-07-19 (×2): qty 30, 30d supply, fill #0

## 2024-07-19 NOTE — Patient Instructions (Addendum)
 I have sent in oxybutynin  for you to take once daily in the morning to help with bladder spasms.  We have sent your urine sample to lab for culture. We will be in contact with results that require further attention.   We have given your flu vaccine today.   Follow-up with me for new or worsening symptoms.

## 2024-07-20 LAB — URINE CULTURE: Result:: NO GROWTH

## 2024-07-21 ENCOUNTER — Ambulatory Visit: Payer: Self-pay | Admitting: Family Medicine

## 2024-07-26 ENCOUNTER — Ambulatory Visit

## 2024-07-26 VITALS — BP 108/72 | Temp 98.0°F | Resp 18 | Ht 66.0 in | Wt 166.4 lb

## 2024-07-26 DIAGNOSIS — K50919 Crohn's disease, unspecified, with unspecified complications: Secondary | ICD-10-CM

## 2024-07-26 MED ORDER — ACETAMINOPHEN 325 MG PO TABS
650.0000 mg | ORAL_TABLET | Freq: Once | ORAL | Status: AC
  Administered 2024-07-26: 650 mg via ORAL
  Filled 2024-07-26: qty 2

## 2024-07-26 MED ORDER — METHYLPREDNISOLONE SODIUM SUCC 40 MG IJ SOLR
40.0000 mg | Freq: Once | INTRAMUSCULAR | Status: AC
  Administered 2024-07-26: 40 mg via INTRAVENOUS
  Filled 2024-07-26: qty 1

## 2024-07-26 MED ORDER — SODIUM CHLORIDE 0.9 % IV SOLN
5.0000 mg/kg | Freq: Once | INTRAVENOUS | Status: AC
  Administered 2024-07-26: 400 mg via INTRAVENOUS
  Filled 2024-07-26: qty 40

## 2024-07-26 MED ORDER — DIPHENHYDRAMINE HCL 25 MG PO CAPS
25.0000 mg | ORAL_CAPSULE | Freq: Once | ORAL | Status: AC
  Administered 2024-07-26: 25 mg via ORAL
  Filled 2024-07-26: qty 1

## 2024-07-26 NOTE — Progress Notes (Signed)
 Diagnosis: Crohn's Disease  Provider:  Mannam, Praveen MD  Procedure: IV Infusion  IV Type: Peripheral, IV Location: L Antecubital  Avsola  (infliximab -axxq), Dose: 400 mg  Infusion Start Time: 1002  Infusion Stop Time: 1219  Post Infusion IV Care: Peripheral IV Discontinued  Discharge: Condition: Good, Destination: Home . AVS Provided  Performed by:  Rocky FORBES Sar, RN

## 2024-08-02 ENCOUNTER — Other Ambulatory Visit: Payer: Self-pay

## 2024-08-02 ENCOUNTER — Ambulatory Visit: Admitting: Orthopedic Surgery

## 2024-08-02 ENCOUNTER — Emergency Department (HOSPITAL_BASED_OUTPATIENT_CLINIC_OR_DEPARTMENT_OTHER)
Admission: EM | Admit: 2024-08-02 | Discharge: 2024-08-02 | Discharge status: Against Medical Advice | Attending: Emergency Medicine | Admitting: Emergency Medicine

## 2024-08-02 DIAGNOSIS — Z5329 Procedure and treatment not carried out because of patient's decision for other reasons: Secondary | ICD-10-CM | POA: Insufficient documentation

## 2024-08-02 DIAGNOSIS — R102 Pelvic and perineal pain: Secondary | ICD-10-CM | POA: Insufficient documentation

## 2024-08-02 DIAGNOSIS — Z79899 Other long term (current) drug therapy: Secondary | ICD-10-CM | POA: Insufficient documentation

## 2024-08-02 DIAGNOSIS — Z7982 Long term (current) use of aspirin: Secondary | ICD-10-CM | POA: Insufficient documentation

## 2024-08-02 DIAGNOSIS — Z794 Long term (current) use of insulin: Secondary | ICD-10-CM | POA: Insufficient documentation

## 2024-08-02 DIAGNOSIS — N939 Abnormal uterine and vaginal bleeding, unspecified: Secondary | ICD-10-CM | POA: Diagnosis not present

## 2024-08-02 DIAGNOSIS — R1030 Lower abdominal pain, unspecified: Secondary | ICD-10-CM | POA: Diagnosis not present

## 2024-08-02 LAB — URINALYSIS, ROUTINE W REFLEX MICROSCOPIC
Bacteria, UA: NONE SEEN
Bilirubin Urine: NEGATIVE
Glucose, UA: >1000 mg/dL — AB
Ketones, ur: NEGATIVE mg/dL
Leukocytes,Ua: NEGATIVE
Nitrite: NEGATIVE
Protein, ur: NEGATIVE mg/dL
Specific Gravity, Urine: 1.023 (ref 1.005–1.030)
pH: 5.5 (ref 5.0–8.0)

## 2024-08-02 LAB — PREGNANCY, URINE: Preg Test, Ur: NEGATIVE

## 2024-08-02 MED ORDER — SODIUM CHLORIDE 0.9 % IV SOLN: Freq: Once | INTRAVENOUS | Status: DC

## 2024-08-02 NOTE — ED Provider Notes (Signed)
 [place] [facility]GENCY DEPARTMENT AT [facility] Provider Note   CSN: [id number] Arrival date & time: 08/02/24  9171     Patient presents with: Pelvic Pain   Cindy Long is a 48 y.o. female.   HPI Patient reports she has had a lot of pelvic pain and pressure.  She indicates diffusely through the lower abdomen.  She reports this just been like a constant pressure sensation for the past month.  She reports it feels kind of like she is just sitting on something all the time.  Patient reports that she has had some intermittent vaginal bleeding.  She has had spotting of some dark vaginal blood and sometimes some red blood.  Patient does get a Depo shot.  She reports that she was seen by gynecology about 2 months ago and had a normal Pap smear and exam.  Patient denies there were any abnormalities at that time.  She denies she reported this sensation to her GYN at that time.  She has not had any fevers or chills.  No vomiting.  Patient reports sometimes she has constipation and has to do some straining at stool.  Patient reports her PCP tried oxybutynin  because she has had some bladder spasms in the past.  She reports that has not been helpful.    Prior to Admission medications   Medication Sig Start Date End Date Taking? Authorizing Provider  acetaminophen  (TYLENOL ) 500 MG tablet Take 1,000 mg by mouth every 6 (six) hours as needed for moderate pain or headache.    [provider]  albuterol  (VENTOLIN  HFA) 108 (90 Base) MCG/ACT inhaler Inhale 1-2 puffs into the lungs every 6 (six) hours as needed for wheezing or shortness of breath. 12/21/22   Geofm Glade PARAS, MD  aspirin  EC 81 MG tablet Take 1 tablet (81 mg total) by mouth daily. Swallow whole. 01/17/22   Wyn Jackee VEAR Mickey., NP  atorvastatin  (LIPITOR) 20 MG tablet Take 1 tablet (20 mg total) by mouth daily. 05/29/24 08/29/24  Walker, Caitlin S, NP  Bacillus Coagulans-Inulin (PROBIOTIC) 1-250 BILLION-MG CAPS Take 1 capsule by  mouth daily.    [provider]  Continuous Glucose Sensor (DEXCOM G7 SENSOR) MISC Change sensor every 10 days 06/14/24     dapagliflozin  propanediol (FARXIGA ) 10 MG TABS tablet Take 1 tablet (10 mg total) by mouth daily. 05/30/24   Plotnikov, Aleksei V, MD  escitalopram  (LEXAPRO ) 20 MG tablet Take 2 tablets (40 mg total) by mouth daily. 05/30/24   Plotnikov, Aleksei V, MD  fluconazole  (DIFLUCAN ) 150 MG tablet Take 1 tablet by mouth daily once as needed yeast infection--this is a prescription for 3 courses as needed 05/30/24   Plotnikov, Karlynn GAILS, MD  Fremanezumab -vfrm (AJOVY ) 225 MG/1.5ML SOAJ Inject 225 mg into the skin every 30 (thirty) days. 11/28/23   Camara, Amadou, MD  gabapentin  (NEURONTIN ) 300 MG capsule Take 1 capsule (300 mg total) by mouth at bedtime. 04/28/23 05/23/25  Gregg Lek, MD  HYDROcodone -acetaminophen  (NORCO/VICODIN) 5-325 MG tablet Take 1 tablet by mouth every 6 (six) hours as needed for severe pain (pain score 7-10). 11/17/23 11/16/24  Plotnikov, Karlynn GAILS, MD  hyoscyamine  (LEVBID ) 0.375 MG 12 hr tablet Take 1 tablet  by mouth as needed as directed (30 days). 11/25/21   Karki, Arya, MD  hyoscyamine  (LEVBID ) 0.375 MG 12 hr tablet Take 1 tablet (0.375 mg total) by mouth every 12 (twelve) hours. 08/04/23     inFLIXimab  (REMICADE  IV) Inject into the vein every 8 (eight)  weeks. Pt. Is unsure of dosing, but goes to have this done every 8 weeks    [provider]  Insulin  Disposable Pump (OMNIPOD 5 [id number] PODS GEN 5) MISC Change pod every 2 days (total insulin  dose 100 units) 12/25/23     Insulin  Disposable Pump (OMNIPOD 5 G6 INTRO, GEN 5,) KIT Change pod every 2 days as directed (Total daily dose of 100 units) 12/14/22     insulin  glargine (LANTUS  SOLOSTAR) 100 UNIT/ML Solostar Pen Inject 20-50 (titrate up to 50) Units into the skin daily as directed. 05/17/24     insulin  lispro (HUMALOG ) 100 UNIT/ML injection Inject up to 200 units per day/via insulin  pump 04/19/24      Insulin  Pen Needle 32G X 4 MM MISC by Does not apply route. BD U/F Pen Needles Nano 4 mm x 32 G; use with insulin     [provider]  Insulin  Pen Needle 32G X 4 MM MISC Use three times daily. 01/12/24   Faythe Purchase, MD  insulin  regular human CONCENTRATED (HUMULIN  R U-500 KWIKPEN) 500 UNIT/ML KwikPen Inject 5-50 Units into the skin 3 (three) times daily with meals. Take 10 units in the morning, 45 units at lunch, 25 units at dinner. Patient taking differently: Inject 5-50 Units into the skin 3 (three) times daily with meals. Take 25 units in the morning, 50 units at lunch, 25 units at dinner. 03/06/22   Sebastian Toribio GAILS, MD  LORazepam  (ATIVAN ) 1 MG tablet Take 0.5 tablets (0.5 mg total) by mouth daily as needed. 02/15/24   Webb, Padonda B, FNP  losartan  (COZAAR ) 50 MG tablet Take 1 tablet (50 mg total) by mouth daily. 05/30/24   Plotnikov, Aleksei V, MD  Magnesium  250 MG TABS Take 1 tablet by mouth daily.    [provider]  medroxyPROGESTERone  Acetate 150 MG/ML SUSY Inject 1 syringe into the muscle every 11-12 weeks 05/25/22     medroxyPROGESTERone  Acetate 150 MG/ML SUSY Inject 1 mL (150 mg total) into the muscle every 3 (three) months. (every 11 - 12 weeks) 06/09/23     medroxyPROGESTERone  Acetate 150 MG/ML SUSY Inject 1 ml into the muscle every 11 - 12 weeks 06/04/24     metoprolol  succinate (TOPROL -XL) 25 MG 24 hr tablet Take 1 tablet (25 mg total) by mouth daily. May take additional half tablet as needed for tachycardia, palpitations. 04/21/23   Vannie Reche RAMAN, NP  naproxen  (NAPROSYN ) 500 MG tablet Take 1 tablet (500 mg total) by mouth 2 (two) times daily as needed for headache. 05/30/24   Plotnikov, Aleksei V, MD  nystatin -triamcinolone  (MYCOLOG II) cream APPLY TO THE AFFECTED AREA(S) TWO TIMES DAILY AS DIRECTED 09/25/21     oxybutynin  (DITROPAN -XL) 10 MG 24 hr tablet Take 1 tablet (10 mg total) by mouth at bedtime. 07/19/24   Alvia Corean CROME, FNP  phentermine  (ADIPEX-P ) 37.5  MG tablet Take 1 tablet (37.5 mg total) by mouth daily before breakfast. 05/30/24   Plotnikov, Aleksei V, MD  promethazine  (PHENERGAN ) 25 MG tablet TAKE 1 TABLET BY MOUTH EVERY 8 HOURS 02/22/24     Riboflavin (VITAMIN B-2 PO) Take 1 tablet by mouth daily.    [provider]  rizatriptan  (MAXALT ) 10 MG tablet Take 1 tablet (10 mg total) by mouth once as needed for up to 1 dose for migraine (Migraine headaches). May repeat in 2 hours if needed 05/23/24   Camara, Amadou, MD  spironolactone  (ALDACTONE ) 25 MG tablet Take 0.5 tablets (12.5 mg total) by mouth daily.  05/30/24   Plotnikov, Aleksei V, MD  SYRINGE-NEEDLE, DISP, 3 ML (B-D 3CC LUER-LOK SYR 25GX5/8) 25G X 5/8 3 ML MISC Use to inject B12 under the skin 05/30/24   Plotnikov, Aleksei V, MD    Allergies: Metronidazole, Shellfish allergy, Empagliflozin-linagliptin, Lisinopril, Metformin  hcl, Prednisone , Dulaglutide, Liraglutide, and Nurtec [rimegepant sulfate]    Review of Systems  Updated Vital Signs BP 137/83   Pulse 89   Temp 98.3 F (36.8 C) (Oral)   Resp 16   SpO2 97%   Physical Exam Constitutional:      Comments: Patient is alert and nontoxic.  Clinically well in appearance.  No respiratory distress.  Well-nourished well-developed.  HENT:     Mouth/Throat:     Pharynx: Oropharynx is clear.  Eyes:     Extraocular Movements: Extraocular movements intact.  Cardiovascular:     Rate and Rhythm: Normal rate and regular rhythm.  Pulmonary:     Effort: Pulmonary effort is normal.     Breath sounds: Normal breath sounds.  Abdominal:     Comments: Abdomen soft.  No guarding.  Patient endorses discomfort diffusely throughout the lower abdomen.  No palpable mass or fullness.  Musculoskeletal:     Comments: No peripheral edema.  Calves are symmetric.  Lower extremities are in good condition.  No joint effusions.  Skin:    General: Skin is warm and dry.  Neurological:     General: No focal deficit present.     Mental Status: She  is oriented to person, place, and time.     Motor: No weakness.     Coordination: Coordination normal.  Psychiatric:        Mood and Affect: Mood normal.     (all labs ordered are listed, but only abnormal results are displayed) Labs Reviewed  URINALYSIS, ROUTINE W REFLEX MICROSCOPIC - Abnormal; Notable for the following components:      Result Value   Color, Urine COLORLESS (*)    Glucose, UA >1,000 (*)    Hgb urine dipstick LARGE (*)    All other components within normal limits  PREGNANCY, URINE  COMPREHENSIVE METABOLIC PANEL WITH GFR  CBC WITH DIFFERENTIAL/PLATELET    EKG: None  Radiology: No results found.   Procedures   Medications Ordered in the ED  0.9 %  sodium chloride  infusion (has no administration in time range)                                    Medical Decision Making Amount and/or Complexity of Data Reviewed Labs: ordered. Radiology: ordered.  Risk Prescription drug management.  Patient presents as outlined.  She has diffuse lower abdominal pain.  Medical record indicates history of Crohn's and diverticulitis.  Patient's abdominal examination is soft without guarding or localizing at this time, no appreciable mass on exam.  However, given duration of symptoms and concurrent possibility of flareup of diverticulitis or Crohn's, will proceed with CT abdomen pelvis and basic lab work.  10: 41 patient's nurse advised me that the patient eloped.  Patient reported to her nurse that she had a headache 20 minutes earlier, which she had not reported at time of my initial evaluation.  Clinically, the patient had well appearance and no signs of acute distress at time of evaluation.  I did not get a chance to speak to her as to why she decided not to complete treatment.   Given lack of  completion of workup, unclear etiology for patient's pelvic pain with differential including diverticulitis\Crohn's\other GU etiology.  Urinalysis is negative pregnancy ruling out  pregnancy related complication or UTI.  There is trace blood but it seems unlikely that pain is due to kidney stone or pyelonephritis given lack of flank pain and localization of pain to be very low pelvis diffusely.  At this time patient is clinically well with stable vital signs.     Final diagnoses:  Pelvic pain in female    ED Discharge Orders     None          Armenta Canning, MD 08/02/24 1053

## 2024-08-02 NOTE — ED Notes (Signed)
 Patient now reporting headache. ED Provider made aware.

## 2024-08-02 NOTE — ED Notes (Addendum)
 This RN saw patient walking towards exit. This RN asked where she was going and patient stated she was leaving. Patient continued through door and left department. Patient did not stay to discuss risks vs. benefits of leaving/receiving care.

## 2024-08-02 NOTE — ED Triage Notes (Signed)
 Patient states pelvic pain for the past month. States it feels like she is sitting on something. Also states vaginal bleeding that began this morning. Patient on depo shot. Seen by PCP for same.

## 2024-08-04 ENCOUNTER — Emergency Department (HOSPITAL_COMMUNITY)

## 2024-08-04 ENCOUNTER — Emergency Department (HOSPITAL_COMMUNITY)
Admission: EM | Admit: 2024-08-04 | Discharge: 2024-08-04 | Disposition: A | Discharge status: Home / Self Care | Attending: Emergency Medicine | Admitting: Emergency Medicine

## 2024-08-04 ENCOUNTER — Encounter (HOSPITAL_COMMUNITY): Payer: Self-pay

## 2024-08-04 DIAGNOSIS — I110 Hypertensive heart disease with heart failure: Secondary | ICD-10-CM | POA: Diagnosis not present

## 2024-08-04 DIAGNOSIS — I482 Chronic atrial fibrillation, unspecified: Secondary | ICD-10-CM | POA: Insufficient documentation

## 2024-08-04 DIAGNOSIS — Z794 Long term (current) use of insulin: Secondary | ICD-10-CM | POA: Diagnosis not present

## 2024-08-04 DIAGNOSIS — R55 Syncope and collapse: Secondary | ICD-10-CM | POA: Insufficient documentation

## 2024-08-04 DIAGNOSIS — I509 Heart failure, unspecified: Secondary | ICD-10-CM | POA: Diagnosis not present

## 2024-08-04 DIAGNOSIS — B9689 Other specified bacterial agents as the cause of diseases classified elsewhere: Secondary | ICD-10-CM

## 2024-08-04 DIAGNOSIS — E119 Type 2 diabetes mellitus without complications: Secondary | ICD-10-CM | POA: Insufficient documentation

## 2024-08-04 DIAGNOSIS — E860 Dehydration: Secondary | ICD-10-CM | POA: Insufficient documentation

## 2024-08-04 DIAGNOSIS — Z79899 Other long term (current) drug therapy: Secondary | ICD-10-CM | POA: Insufficient documentation

## 2024-08-04 DIAGNOSIS — J449 Chronic obstructive pulmonary disease, unspecified: Secondary | ICD-10-CM | POA: Diagnosis not present

## 2024-08-04 DIAGNOSIS — W01198A Fall on same level from slipping, tripping and stumbling with subsequent striking against other object, initial encounter: Secondary | ICD-10-CM | POA: Insufficient documentation

## 2024-08-04 DIAGNOSIS — N939 Abnormal uterine and vaginal bleeding, unspecified: Secondary | ICD-10-CM

## 2024-08-04 DIAGNOSIS — Z7901 Long term (current) use of anticoagulants: Secondary | ICD-10-CM | POA: Diagnosis not present

## 2024-08-04 DIAGNOSIS — S0003XA Contusion of scalp, initial encounter: Secondary | ICD-10-CM | POA: Diagnosis not present

## 2024-08-04 DIAGNOSIS — Z9049 Acquired absence of other specified parts of digestive tract: Secondary | ICD-10-CM | POA: Diagnosis not present

## 2024-08-04 DIAGNOSIS — R1900 Intra-abdominal and pelvic swelling, mass and lump, unspecified site: Secondary | ICD-10-CM

## 2024-08-04 DIAGNOSIS — Y92129 Unspecified place in nursing home as the place of occurrence of the external cause: Secondary | ICD-10-CM | POA: Diagnosis not present

## 2024-08-04 DIAGNOSIS — N760 Acute vaginitis: Secondary | ICD-10-CM | POA: Diagnosis not present

## 2024-08-04 DIAGNOSIS — K760 Fatty (change of) liver, not elsewhere classified: Secondary | ICD-10-CM | POA: Diagnosis not present

## 2024-08-04 DIAGNOSIS — R102 Pelvic and perineal pain: Secondary | ICD-10-CM | POA: Diagnosis not present

## 2024-08-04 LAB — WET PREP, GENITAL
Sperm: NONE SEEN
Trich, Wet Prep: NONE SEEN
WBC, Wet Prep HPF POC: <10 (ref <10)
Yeast Wet Prep HPF POC: NONE SEEN

## 2024-08-04 LAB — COMPREHENSIVE METABOLIC PANEL WITH GFR
ALT: 23 U/L (ref 0–44)
AST: 18 U/L (ref 15–41)
Albumin: 3.6 g/dL (ref 3.5–5.0)
Alkaline Phosphatase: 56 U/L (ref 38–126)
Anion gap: 13 (ref 5–15)
BUN: 9 mg/dL (ref 6–20)
CO2: 21 mmol/L — ABNORMAL LOW (ref 22–32)
Calcium: 9 mg/dL (ref 8.9–10.3)
Chloride: 109 mmol/L (ref 98–111)
Creatinine, Ser: 0.71 mg/dL (ref 0.44–1.00)
GFR, Estimated: >60 mL/min (ref >60)
Glucose, Bld: 253 mg/dL — ABNORMAL HIGH (ref 70–99)
Potassium: 4.2 mmol/L (ref 3.5–5.1)
Sodium: 143 mmol/L (ref 135–145)
Total Bilirubin: 0.8 mg/dL (ref 0.0–1.2)
Total Protein: 7.1 g/dL (ref 6.5–8.1)

## 2024-08-04 LAB — URINALYSIS, ROUTINE W REFLEX MICROSCOPIC
Bacteria, UA: NONE SEEN
Bilirubin Urine: NEGATIVE
Glucose, UA: >=500 mg/dL — AB
Ketones, ur: NEGATIVE mg/dL
Leukocytes,Ua: NEGATIVE
Nitrite: NEGATIVE
Protein, ur: NEGATIVE mg/dL
Specific Gravity, Urine: 1.026 (ref 1.005–1.030)
pH: 6 (ref 5.0–8.0)

## 2024-08-04 LAB — I-STAT CHEM 8, ED
BUN: 8 mg/dL (ref 6–20)
Calcium, Ion: 1.2 mmol/L (ref 1.15–1.40)
Chloride: 111 mmol/L (ref 98–111)
Creatinine, Ser: 0.6 mg/dL (ref 0.44–1.00)
Glucose, Bld: 300 mg/dL — ABNORMAL HIGH (ref 70–99)
HCT: 41 % (ref 36.0–46.0)
Hemoglobin: 13.9 g/dL (ref 12.0–15.0)
Potassium: 3.8 mmol/L (ref 3.5–5.1)
Sodium: 142 mmol/L (ref 135–145)
TCO2: 18 mmol/L — ABNORMAL LOW (ref 22–32)

## 2024-08-04 LAB — CBC WITH DIFFERENTIAL/PLATELET
Basophils Absolute: 0.1 K/uL (ref 0.0–0.1)
Basophils Relative: 1 %
Eosinophils Absolute: 0 K/uL (ref 0.0–0.5)
Eosinophils Relative: 0 %
HCT: 43.7 % (ref 36.0–46.0)
Hemoglobin: 14.2 g/dL (ref 12.0–15.0)
Lymphocytes Relative: 33 %
Lymphs Abs: 3.1 K/uL (ref 0.7–4.0)
MCH: 27.5 pg (ref 26.0–34.0)
MCHC: 32.5 g/dL (ref 30.0–36.0)
MCV: 84.5 fL (ref 80.0–100.0)
Monocytes Absolute: 0.4 K/uL (ref 0.1–1.0)
Monocytes Relative: 4 %
Neutro Abs: 5.9 K/uL (ref 1.7–7.7)
Neutrophils Relative %: 62 %
Platelets: 286 K/uL (ref 150–400)
RBC: 5.17 MIL/uL — ABNORMAL HIGH (ref 3.87–5.11)
RDW: 14 % (ref 11.5–15.5)
WBC: 9.5 K/uL (ref 4.0–10.5)
nRBC: 0 % (ref 0.0–0.2)

## 2024-08-04 LAB — HCG, SERUM, QUALITATIVE: Preg, Serum: NEGATIVE

## 2024-08-04 MED ORDER — HYDROMORPHONE HCL 1 MG/ML IJ SOLN
0.5000 mg | Freq: Once | INTRAMUSCULAR | Status: AC
  Administered 2024-08-04: 0.5 mg via INTRAVENOUS
  Filled 2024-08-04: qty 1

## 2024-08-04 MED ORDER — CLINDAMYCIN PHOSPHATE 2 % VA CREA
1.0000 | TOPICAL_CREAM | Freq: Every day | VAGINAL | 0 refills | Status: AC
  Filled 2024-08-04: qty 40, 7d supply, fill #0

## 2024-08-04 MED ORDER — IOHEXOL 350 MG/ML SOLN
75.0000 mL | Freq: Once | INTRAVENOUS | Status: AC | PRN
Start: 2024-08-04 — End: 2024-08-04
  Administered 2024-08-04: 75 mL via INTRAVENOUS

## 2024-08-04 MED ORDER — LACTATED RINGERS IV BOLUS
1000.0000 mL | Freq: Once | INTRAVENOUS | Status: AC
  Administered 2024-08-04: 1000 mL via INTRAVENOUS

## 2024-08-04 MED ORDER — KETOROLAC TROMETHAMINE 15 MG/ML IJ SOLN
15.0000 mg | Freq: Once | INTRAMUSCULAR | Status: AC
  Administered 2024-08-04: 15 mg via INTRAVENOUS
  Filled 2024-08-04: qty 1

## 2024-08-04 MED ORDER — ONDANSETRON HCL 4 MG/2ML IJ SOLN
4.0000 mg | Freq: Once | INTRAMUSCULAR | Status: AC
  Administered 2024-08-04: 4 mg via INTRAVENOUS
  Filled 2024-08-04: qty 2

## 2024-08-04 NOTE — ED Triage Notes (Signed)
 Pt c/o pelvic pressure x 1 month and vaginal bleeding x 2 weeks.  Pt states no menstrual cycle for 10 years when she was started on depo.

## 2024-08-04 NOTE — ED Provider Notes (Signed)
 [place] [facility] AT [facility] Provider Note   CSN: [id number] Arrival date & time: 08/04/24  1454     Patient presents with: Pelvic Pain   Cindy Long is a 48 y.o. female.    Pelvic Pain Associated symptoms include abdominal pain.  Patient presents for lower abdominal pain.  Medical history includes IBS, HLD, HTN, Crohn's disease, depression, asthma, anemia, T1DM, migraines.  She was seen in the ED 2 days ago for pelvic pain and pressure.  At the time, she reported similar symptoms over the past month.  She experiences a fullness sensation.  She has had intermittent vaginal bleeding.  She had a normal Pap smear 2 months ago.  She has occasional constipation.  She has had bladder spasms in the past and has tried oxybutynin  without relief.  Prior to workup completion 2 days ago, patient eloped.  Per chart review, workup from 2 days ago was notable for hematuria, glucosuria and negative pregnancy test.  Serum lab work and imaging was not performed.  Since her incomplete ED visit 2 days ago, patient has had ongoing pain and fullness in her lower abdomen.  Her last bowel movement was 2 days ago.  Prior to that, she had several days of constipation.  She reports that she is typically regular with her bowel movements.  She denies any difficulty urinating or any urinary discomfort.  She has had ongoing vaginal spotting.     Prior to Admission medications   Medication Sig Start Date End Date Taking? Authorizing Provider  clindamycin (CLEOCIN) 2 % vaginal cream Place 1 Applicatorful vaginally at bedtime. 08/04/24  Yes Melvenia Motto, MD  acetaminophen  (TYLENOL ) 500 MG tablet Take 1,000 mg by mouth every 6 (six) hours as needed for moderate pain or headache.    [provider]  albuterol  (VENTOLIN  HFA) 108 (90 Base) MCG/ACT inhaler Inhale 1-2 puffs into the lungs every 6 (six) hours as needed for wheezing or shortness of breath. 12/21/22   Geofm Glade PARAS, MD   aspirin  EC 81 MG tablet Take 1 tablet (81 mg total) by mouth daily. Swallow whole. 01/17/22   Wyn Jackee VEAR Mickey., NP  atorvastatin  (LIPITOR) 20 MG tablet Take 1 tablet (20 mg total) by mouth daily. 05/29/24 08/29/24  Walker, Caitlin S, NP  Bacillus Coagulans-Inulin (PROBIOTIC) 1-250 BILLION-MG CAPS Take 1 capsule by mouth daily.    [provider]  Continuous Glucose Sensor (DEXCOM G7 SENSOR) MISC Change sensor every 10 days 06/14/24     dapagliflozin  propanediol (FARXIGA ) 10 MG TABS tablet Take 1 tablet (10 mg total) by mouth daily. 05/30/24   Plotnikov, Aleksei V, MD  escitalopram  (LEXAPRO ) 20 MG tablet Take 2 tablets (40 mg total) by mouth daily. 05/30/24   Plotnikov, Aleksei V, MD  fluconazole  (DIFLUCAN ) 150 MG tablet Take 1 tablet by mouth daily once as needed yeast infection--this is a prescription for 3 courses as needed 05/30/24   Plotnikov, Karlynn GAILS, MD  Fremanezumab -vfrm (AJOVY ) 225 MG/1.5ML SOAJ Inject 225 mg into the skin every 30 (thirty) days. 11/28/23   Camara, Amadou, MD  gabapentin  (NEURONTIN ) 300 MG capsule Take 1 capsule (300 mg total) by mouth at bedtime. 04/28/23 05/23/25  Gregg Lek, MD  HYDROcodone -acetaminophen  (NORCO/VICODIN) 5-325 MG tablet Take 1 tablet by mouth every 6 (six) hours as needed for severe pain (pain score 7-10). 11/17/23 11/16/24  Plotnikov, Karlynn GAILS, MD  hyoscyamine  (LEVBID ) 0.375 MG 12 hr tablet Take 1 tablet  by mouth as needed as directed (  30 days). 11/25/21   Saintclair Jasper, MD  hyoscyamine  (LEVBID ) 0.375 MG 12 hr tablet Take 1 tablet (0.375 mg total) by mouth every 12 (twelve) hours. 08/04/23     inFLIXimab  (REMICADE  IV) Inject into the vein every 8 (eight) weeks. Pt. Is unsure of dosing, but goes to have this done every 8 weeks    [provider]  Insulin  Disposable Pump (OMNIPOD 5 [id number] PODS GEN 5) MISC Change pod every 2 days (total insulin  dose 100 units) 12/25/23     Insulin  Disposable Pump (OMNIPOD 5 G6 INTRO, GEN 5,) KIT Change pod every 2  days as directed (Total daily dose of 100 units) 12/14/22     insulin  glargine (LANTUS  SOLOSTAR) 100 UNIT/ML Solostar Pen Inject 20-50 (titrate up to 50) Units into the skin daily as directed. 05/17/24     insulin  lispro (HUMALOG ) 100 UNIT/ML injection Inject up to 200 units per day/via insulin  pump 04/19/24     Insulin  Pen Needle 32G X 4 MM MISC by Does not apply route. BD U/F Pen Needles Nano 4 mm x 32 G; use with insulin     [provider]  Insulin  Pen Needle 32G X 4 MM MISC Use three times daily. 01/12/24   Faythe Purchase, MD  insulin  regular human CONCENTRATED (HUMULIN  R U-500 KWIKPEN) 500 UNIT/ML KwikPen Inject 5-50 Units into the skin 3 (three) times daily with meals. Take 10 units in the morning, 45 units at lunch, 25 units at dinner. Patient taking differently: Inject 5-50 Units into the skin 3 (three) times daily with meals. Take 25 units in the morning, 50 units at lunch, 25 units at dinner. 03/06/22   Sebastian Toribio GAILS, MD  LORazepam  (ATIVAN ) 1 MG tablet Take 0.5 tablets (0.5 mg total) by mouth daily as needed. 02/15/24   Webb, Padonda B, FNP  losartan  (COZAAR ) 50 MG tablet Take 1 tablet (50 mg total) by mouth daily. 05/30/24   Plotnikov, Aleksei V, MD  Magnesium  250 MG TABS Take 1 tablet by mouth daily.    [provider]  medroxyPROGESTERone  Acetate 150 MG/ML SUSY Inject 1 syringe into the muscle every 11-12 weeks 05/25/22     medroxyPROGESTERone  Acetate 150 MG/ML SUSY Inject 1 mL (150 mg total) into the muscle every 3 (three) months. (every 11 - 12 weeks) 06/09/23     medroxyPROGESTERone  Acetate 150 MG/ML SUSY Inject 1 ml into the muscle every 11 - 12 weeks 06/04/24     metoprolol  succinate (TOPROL -XL) 25 MG 24 hr tablet Take 1 tablet (25 mg total) by mouth daily. May take additional half tablet as needed for tachycardia, palpitations. 04/21/23   Vannie Reche RAMAN, NP  naproxen  (NAPROSYN ) 500 MG tablet Take 1 tablet (500 mg total) by mouth 2 (two) times daily as needed for headache.  05/30/24   Plotnikov, Aleksei V, MD  nystatin -triamcinolone  (MYCOLOG II) cream APPLY TO THE AFFECTED AREA(S) TWO TIMES DAILY AS DIRECTED 09/25/21     oxybutynin  (DITROPAN -XL) 10 MG 24 hr tablet Take 1 tablet (10 mg total) by mouth at bedtime. 07/19/24   Alvia Corean CROME, FNP  phentermine  (ADIPEX-P ) 37.5 MG tablet Take 1 tablet (37.5 mg total) by mouth daily before breakfast. 05/30/24   Plotnikov, Aleksei V, MD  promethazine  (PHENERGAN ) 25 MG tablet TAKE 1 TABLET BY MOUTH EVERY 8 HOURS 02/22/24     Riboflavin (VITAMIN B-2 PO) Take 1 tablet by mouth daily.    [provider]  rizatriptan  (MAXALT ) 10 MG tablet Take 1 tablet (10 mg  total) by mouth once as needed for up to 1 dose for migraine (Migraine headaches). May repeat in 2 hours if needed 05/23/24   Camara, Amadou, MD  spironolactone  (ALDACTONE ) 25 MG tablet Take 0.5 tablets (12.5 mg total) by mouth daily. 05/30/24   Plotnikov, Aleksei V, MD  SYRINGE-NEEDLE, DISP, 3 ML (B-D 3CC LUER-LOK SYR 25GX5/8) 25G X 5/8 3 ML MISC Use to inject B12 under the skin 05/30/24   Plotnikov, Aleksei V, MD    Allergies: Metronidazole, Shellfish allergy, Empagliflozin-linagliptin, Lisinopril, Metformin  hcl, Prednisone , Dulaglutide, Liraglutide, and Nurtec [rimegepant sulfate]    Review of Systems  Gastrointestinal:  Positive for abdominal pain, constipation and nausea.  Genitourinary:  Positive for pelvic pain.  All other systems reviewed and are negative.   Updated Vital Signs BP 129/78 (BP Location: Right Arm)   Pulse 77   Temp 99.3 F (37.4 C) (Oral)   Resp 16   SpO2 100%   Physical Exam Vitals and nursing note reviewed.  Constitutional:      General: She is not in acute distress.    Appearance: Normal appearance. She is well-developed. She is not ill-appearing, toxic-appearing or diaphoretic.  HENT:     Head: Normocephalic and atraumatic.     Right Ear: External ear normal.     Left Ear: External ear normal.     Nose: Nose normal.      Mouth/Throat:     Mouth: Mucous membranes are moist.  Eyes:     Extraocular Movements: Extraocular movements intact.     Conjunctiva/sclera: Conjunctivae normal.  Cardiovascular:     Rate and Rhythm: Normal rate and regular rhythm.  Pulmonary:     Effort: Pulmonary effort is normal. No respiratory distress.  Abdominal:     General: There is no distension.     Palpations: Abdomen is soft.     Tenderness: There is abdominal tenderness. There is no guarding or rebound.  Musculoskeletal:        General: No swelling. Normal range of motion.     Cervical back: Normal range of motion and neck supple.  Skin:    General: Skin is warm and dry.     Coloration: Skin is not jaundiced or pale.  Neurological:     General: No focal deficit present.     Mental Status: She is alert and oriented to person, place, and time.  Psychiatric:        Mood and Affect: Mood normal.        Behavior: Behavior normal.     (all labs ordered are listed, but only abnormal results are displayed) Labs Reviewed  WET PREP, GENITAL - Abnormal; Notable for the following components:      Result Value   Clue Cells Wet Prep HPF POC PRESENT (*)    All other components within normal limits  CBC WITH DIFFERENTIAL/PLATELET - Abnormal; Notable for the following components:   RBC 5.17 (*)    All other components within normal limits  COMPREHENSIVE METABOLIC PANEL WITH GFR - Abnormal; Notable for the following components:   CO2 21 (*)    Glucose, Bld 253 (*)    All other components within normal limits  URINALYSIS, ROUTINE W REFLEX MICROSCOPIC - Abnormal; Notable for the following components:   Color, Urine STRAW (*)    Glucose, UA >=500 (*)    Hgb urine dipstick LARGE (*)    All other components within normal limits  I-STAT CHEM 8, ED - Abnormal; Notable for the following components:  Glucose, Bld 300 (*)    TCO2 18 (*)    All other components within normal limits  HCG, SERUM, QUALITATIVE  LIPASE, BLOOD     EKG: None  Radiology: CT ABDOMEN PELVIS W CONTRAST Result Date: 08/04/2024 CLINICAL DATA:  Pelvic pressure x1 month with vaginal bleeding x2 weeks. EXAM: CT ABDOMEN AND PELVIS WITH CONTRAST TECHNIQUE: Multidetector CT imaging of the abdomen and pelvis was performed using the standard protocol following bolus administration of intravenous contrast. RADIATION DOSE REDUCTION: This exam was performed according to the departmental dose-optimization program which includes automated exposure control, adjustment of the mA and/or kV according to patient size and/or use of iterative reconstruction technique. CONTRAST:  75mL OMNIPAQUE  IOHEXOL  350 MG/ML SOLN COMPARISON:  September 15, 2023 FINDINGS: Lower chest: No acute abnormality. Hepatobiliary: There is diffuse fatty infiltration of the liver parenchyma. No focal liver abnormality is seen. Status post cholecystectomy. No biliary dilatation. Pancreas: Unremarkable. No pancreatic ductal dilatation or surrounding inflammatory changes. Spleen: Normal in size without focal abnormality. Adrenals/Urinary Tract: Adrenal glands are unremarkable. Kidneys are normal, without renal calculi, focal lesion, or hydronephrosis. Bladder is unremarkable. Stomach/Bowel: Stomach is within normal limits. Appendix appears normal. Stool is seen throughout the large bowel, more prominently within the mid to distal sigmoid colon. No evidence of bowel wall thickening, distention, or inflammatory changes. Noninflamed diverticula are seen throughout the large bowel. Vascular/Lymphatic: No significant vascular findings are present. No enlarged abdominal or pelvic lymph nodes. Reproductive: Uterus and bilateral adnexa are unremarkable. Other: No abdominal wall hernia or abnormality. No abdominopelvic ascites. Musculoskeletal: No acute or significant osseous findings. IMPRESSION: 1. Hepatic steatosis. 2. Colonic diverticulosis. 3. Evidence of prior cholecystectomy. Electronically Signed   By:  Suzen Dials M.D.   On: 08/04/2024 17:23     Procedures   Medications Ordered in the ED  lactated ringers  bolus 1,000 mL (0 mLs Intravenous Stopped 08/04/24 1909)  ketorolac  (TORADOL ) 15 MG/ML injection 15 mg (15 mg Intravenous Given 08/04/24 1701)  HYDROmorphone  (DILAUDID ) injection 0.5 mg (0.5 mg Intravenous Given 08/04/24 1701)  ondansetron  (ZOFRAN ) injection 4 mg (4 mg Intravenous Given 08/04/24 1701)  iohexol  (OMNIPAQUE ) 350 MG/ML injection 75 mL (75 mLs Intravenous Contrast Given 08/04/24 1655)                                    Medical Decision Making Amount and/or Complexity of Data Reviewed Labs: ordered.  Risk Prescription drug management.   This patient presents to the ED for concern of lower abdominal discomfort and vaginal bleeding, this involves an extensive number of treatment options, and is a complaint that carries with it a high risk of complications and morbidity.  The differential diagnosis includes UTI, urine retention, neoplasm, menstrual cycle, organ prolapse   Co morbidities / Chronic conditions that complicate the patient evaluation  IBS, HLD, HTN, Crohn's disease, depression, asthma, anemia, T1DM, migraines   Additional history obtained:  Additional history obtained from EMR External records from outside source obtained and reviewed including N/A   Lab Tests:  I Ordered, and personally interpreted labs.  The pertinent results include: Normal hemoglobin, no leukocytosis, normal kidney function, normal electrolytes.  Mild hyperglycemia is present without evidence of DKA.  No UTI.   Imaging Studies ordered:  I ordered imaging studies including CT of abdomen and pelvis I independently visualized and interpreted imaging which showed no acute findings I agree with the radiologist interpretation   Cardiac Monitoring: /  EKG:  The patient was maintained on a cardiac monitor.  I personally viewed and interpreted the cardiac monitored which showed  an underlying rhythm of: Sinus rhythm   Problem List / ED Course / Critical interventions / Medication management  Patient presents for abdominal pain and fullness.  She reports that this has been ongoing for the past month but has worsened over the past several days.  Additionally, she has had nausea without vomiting, constipation, and vaginal spotting.  Vital signs on arrival in the ED are normal.  Patient is well-appearing on exam.  Abdomen is soft.  She has mild tenderness to the palpation.  Current pain is 8/10 in severity.  Medication ordered for analgesia.  Workup was initiated.  Lab work was done remarkable.  CT imaging did not show any acute findings.  On CT scans, she did have a bladder with 600 cc of urine present.  Patient urinated in the ED and postvoid residual showed only 20 cc of urine present in bladder.  I do not suspect urinary retention.  Patient underwent pelvic exam with NT chaperone present.  There was no active bleeding.  There was a small amount of dark blood pooled in vaginal vault.  No lacerations or abnormal findings were identified on vaginal wall.  Patient is established with OB/GYN.  She was advised to follow-up with them for consideration of uterine biopsy or other treatment of her ongoing scant vaginal bleeding over the past several weeks.  Wet prep showed BV.  She has a listed allergy to Flagyl.  Clindamycin vaginal cream was prescribed.  Patient was discharged in stable condition. I ordered medication including IV fluids for hydration, Zofran  for nausea, Toradol  and Dilaudid  for analgesia Reevaluation of the patient after these medicines showed that the patient improved I have reviewed the patients home medicines and have made adjustments as needed  Social Determinants of Health:  Has access to outpatient care      Final diagnoses:  Vaginal bleeding  Pelvic fullness  Bacterial vaginosis    ED Discharge Orders          Ordered    clindamycin (CLEOCIN) 2 %  vaginal cream  Daily at bedtime        08/04/24 1933               Melvenia Motto, MD 08/04/24 1933

## 2024-08-04 NOTE — ED Notes (Signed)
Pelvic cart set up for pt.

## 2024-08-04 NOTE — ED Notes (Signed)
 PT OTF to CT

## 2024-08-04 NOTE — ED Triage Notes (Signed)
 Patient reports significant pelvic pressure, urinary frequency, blood on toilet tissue however unable to determine whether vaginal or urethral as it is only when wiping, not on pad. Patient is diabetic and does still receive depo shot. Unable to stay at [facility] to complete care on Friday.

## 2024-08-04 NOTE — Discharge Instructions (Addendum)
 Your test results today were overall reassuring.  Your vaginal swab did show bacterial vaginosis.  A prescription for antibiotic vaginal cream was sent to your pharmacy.  Take as prescribed.  Please follow-up with your OB/GYN for further evaluation of your vaginal bleeding.  Take ibuprofen  and Tylenol  as needed for discomfort.  Return to the emergency department for any new or worsening symptoms of concern.

## 2024-08-05 ENCOUNTER — Other Ambulatory Visit (HOSPITAL_COMMUNITY): Payer: Self-pay

## 2024-08-08 ENCOUNTER — Encounter: Payer: Self-pay | Admitting: Internal Medicine

## 2024-08-08 ENCOUNTER — Ambulatory Visit: Admitting: Internal Medicine

## 2024-08-08 VITALS — BP 140/92 | HR 92 | Temp 98.6°F | Ht 66.0 in | Wt 169.4 lb

## 2024-08-08 DIAGNOSIS — E109 Type 1 diabetes mellitus without complications: Secondary | ICD-10-CM

## 2024-08-08 DIAGNOSIS — B9689 Other specified bacterial agents as the cause of diseases classified elsewhere: Secondary | ICD-10-CM | POA: Diagnosis not present

## 2024-08-08 DIAGNOSIS — N939 Abnormal uterine and vaginal bleeding, unspecified: Secondary | ICD-10-CM | POA: Diagnosis not present

## 2024-08-08 DIAGNOSIS — N760 Acute vaginitis: Secondary | ICD-10-CM

## 2024-08-08 DIAGNOSIS — K50919 Crohn's disease, unspecified, with unspecified complications: Secondary | ICD-10-CM | POA: Diagnosis not present

## 2024-08-08 NOTE — Assessment & Plan Note (Signed)
 On Clindamycin

## 2024-08-08 NOTE — Progress Notes (Signed)
 Subjective:  Patient ID: Cindy Long, female    DOB: 21-Aug-1976  Age: 48 y.o. MRN: [id number]  CC: Pain (Patient states pelvic pain. Started about a month ago. Pain is every day. Patient went to ER and is doing an ER f/u. States she has started getting migraines.)   HPI Cindy Long presents for pelvic bloating, worse w/sitting C/o passing 2 huge clots vaginally (on Depo progesterone  for years, last one in August) plus bleeding  Outpatient Medications Prior to Visit  Medication Sig Dispense Refill   acetaminophen  (TYLENOL ) 500 MG tablet Take 1,000 mg by mouth every 6 (six) hours as needed for moderate pain or headache.     albuterol  (VENTOLIN  HFA) 108 (90 Base) MCG/ACT inhaler Inhale 1-2 puffs into the lungs every 6 (six) hours as needed for wheezing or shortness of breath. 18 g 5   aspirin  EC 81 MG tablet Take 1 tablet (81 mg total) by mouth daily. Swallow whole. 90 tablet 3   atorvastatin  (LIPITOR) 20 MG tablet Take 1 tablet (20 mg total) by mouth daily. 90 tablet 3   Bacillus Coagulans-Inulin (PROBIOTIC) 1-250 BILLION-MG CAPS Take 1 capsule by mouth daily.     clindamycin (CLEOCIN) 2 % vaginal cream Place 1 Applicatorful vaginally at bedtime. 40 g 0   Continuous Glucose Sensor (DEXCOM G7 SENSOR) MISC Change sensor every 10 days 9 each 4   dapagliflozin  propanediol (FARXIGA ) 10 MG TABS tablet Take 1 tablet (10 mg total) by mouth daily. 90 tablet 3   escitalopram  (LEXAPRO ) 20 MG tablet Take 2 tablets (40 mg total) by mouth daily. 180 tablet 3   fluconazole  (DIFLUCAN ) 150 MG tablet Take 1 tablet by mouth daily once as needed yeast infection--this is a prescription for 3 courses as needed 13 tablet 0   Fremanezumab -vfrm (AJOVY ) 225 MG/1.5ML SOAJ Inject 225 mg into the skin every 30 (thirty) days. 1.5 mL 11   gabapentin  (NEURONTIN ) 300 MG capsule Take 1 capsule (300 mg total) by mouth at bedtime. 90 capsule 1   hyoscyamine  (LEVBID ) 0.375 MG 12 hr tablet Take 1 tablet  by mouth as  needed as directed (30 days). 30 tablet 1   hyoscyamine  (LEVBID ) 0.375 MG 12 hr tablet Take 1 tablet (0.375 mg total) by mouth every 12 (twelve) hours. 60 tablet 3   inFLIXimab  (REMICADE  IV) Inject into the vein every 8 (eight) weeks. Pt. Is unsure of dosing, but goes to have this done every 8 weeks     Insulin  Disposable Pump (OMNIPOD 5 [id number] PODS GEN 5) MISC Change pod every 2 days (total insulin  dose 100 units) 30 each 3   Insulin  Disposable Pump (OMNIPOD 5 G6 INTRO, GEN 5,) KIT Change pod every 2 days as directed (Total daily dose of 100 units) 1 kit 0   insulin  glargine (LANTUS  SOLOSTAR) 100 UNIT/ML Solostar Pen Inject 20-50 (titrate up to 50) Units into the skin daily as directed. 30 mL 4   insulin  lispro (HUMALOG ) 100 UNIT/ML injection Inject up to 200 units per day/via insulin  pump 60 mL 11   Insulin  Pen Needle 32G X 4 MM MISC by Does not apply route. BD U/F Pen Needles Nano 4 mm x 32 G; use with insulin      Insulin  Pen Needle 32G X 4 MM MISC Use three times daily. 300 each 4   insulin  regular human CONCENTRATED (HUMULIN  R U-500 KWIKPEN) 500 UNIT/ML KwikPen Inject 5-50 Units into the skin 3 (three) times daily with meals. Take 10 units in  the morning, 45 units at lunch, 25 units at dinner. (Patient taking differently: Inject 5-50 Units into the skin 3 (three) times daily with meals. Take 25 units in the morning, 50 units at lunch, 25 units at dinner.) 24 mL 3   LORazepam  (ATIVAN ) 1 MG tablet Take 0.5 tablets (0.5 mg total) by mouth daily as needed. 60 tablet 1   losartan  (COZAAR ) 50 MG tablet Take 1 tablet (50 mg total) by mouth daily. 90 tablet 3   Magnesium  250 MG TABS Take 1 tablet by mouth daily.     medroxyPROGESTERone  Acetate 150 MG/ML SUSY Inject 1 syringe into the muscle every 11-12 weeks 1 mL 11   medroxyPROGESTERone  Acetate 150 MG/ML SUSY Inject 1 mL (150 mg total) into the muscle every 3 (three) months. (every 11 - 12 weeks) 1 mL 11   medroxyPROGESTERone  Acetate 150 MG/ML SUSY  Inject 1 ml into the muscle every 11 - 12 weeks 1 mL 11   metoprolol  succinate (TOPROL -XL) 25 MG 24 hr tablet Take 1 tablet (25 mg total) by mouth daily. May take additional half tablet as needed for tachycardia, palpitations. 135 tablet 3   naproxen  (NAPROSYN ) 500 MG tablet Take 1 tablet (500 mg total) by mouth 2 (two) times daily as needed for headache. 60 tablet 2   nystatin -triamcinolone  (MYCOLOG II) cream APPLY TO THE AFFECTED AREA(S) TWO TIMES DAILY AS DIRECTED 20 g 2   phentermine  (ADIPEX-P ) 37.5 MG tablet Take 1 tablet (37.5 mg total) by mouth daily before breakfast. 30 tablet 2   promethazine  (PHENERGAN ) 25 MG tablet TAKE 1 TABLET BY MOUTH EVERY 8 HOURS 90 tablet 1   Riboflavin (VITAMIN B-2 PO) Take 1 tablet by mouth daily.     rizatriptan  (MAXALT ) 10 MG tablet Take 1 tablet (10 mg total) by mouth once as needed for up to 1 dose for migraine (Migraine headaches). May repeat in 2 hours if needed 18 tablet 6   spironolactone  (ALDACTONE ) 25 MG tablet Take 0.5 tablets (12.5 mg total) by mouth daily. 45 tablet 3   SYRINGE-NEEDLE, DISP, 3 ML (B-D 3CC LUER-LOK SYR 25GX5/8) 25G X 5/8 3 ML MISC Use to inject B12 under the skin 50 each 3   HYDROcodone -acetaminophen  (NORCO/VICODIN) 5-325 MG tablet Take 1 tablet by mouth every 6 (six) hours as needed for severe pain (pain score 7-10). (Patient not taking: Reported on 08/08/2024) 20 tablet 0   oxybutynin  (DITROPAN -XL) 10 MG 24 hr tablet Take 1 tablet (10 mg total) by mouth at bedtime. (Patient not taking: Reported on 08/08/2024) 30 tablet 1   No facility-administered medications prior to visit.    ROS: Review of Systems  Constitutional:  Positive for fatigue. Negative for activity change, appetite change, chills and unexpected weight change.  HENT:  Negative for congestion, mouth sores and sinus pressure.   Eyes:  Negative for visual disturbance.  Respiratory:  Negative for cough and chest tightness.   Gastrointestinal:  Positive for abdominal  distention and abdominal pain. Negative for blood in stool, diarrhea, nausea and rectal pain.  Genitourinary:  Positive for menstrual problem, pelvic pain and vaginal bleeding. Negative for difficulty urinating, frequency, genital sores, hematuria, urgency, vaginal discharge and vaginal pain.  Musculoskeletal:  Negative for back pain and gait problem.  Skin:  Negative for pallor and rash.  Neurological:  Negative for dizziness, tremors, weakness, numbness and headaches.  Psychiatric/Behavioral:  Negative for confusion and sleep disturbance.     Objective:  BP (!) 140/92   Pulse 92  Temp 98.6 F (37 C) (Oral)   Ht 5' 6 (1.676 m)   Wt 169 lb 6.4 oz (76.8 kg)   SpO2 94%   BMI 27.34 kg/m   BP Readings from Last 3 Encounters:  08/08/24 (!) 140/92  08/04/24 129/78  08/02/24 137/83    Wt Readings from Last 3 Encounters:  08/08/24 169 lb 6.4 oz (76.8 kg)  07/26/24 166 lb 6.4 oz (75.5 kg)  07/19/24 160 lb (72.6 kg)    Physical Exam Constitutional:      General: She is not in acute distress.    Appearance: Normal appearance. She is well-developed.  HENT:     Head: Normocephalic.     Right Ear: External ear normal.     Left Ear: External ear normal.     Nose: Nose normal.  Eyes:     General:        Right eye: No discharge.        Left eye: No discharge.     Conjunctiva/sclera: Conjunctivae normal.     Pupils: Pupils are equal, round, and reactive to light.  Neck:     Thyroid : No thyromegaly.     Vascular: No JVD.     Trachea: No tracheal deviation.  Cardiovascular:     Rate and Rhythm: Normal rate and regular rhythm.     Heart sounds: Normal heart sounds.  Pulmonary:     Effort: No respiratory distress.     Breath sounds: No stridor. No wheezing.  Abdominal:     General: Bowel sounds are normal. There is no distension.     Palpations: Abdomen is soft. There is no mass.     Tenderness: There is no abdominal tenderness. There is no guarding or rebound.   Musculoskeletal:        General: No tenderness.     Cervical back: Normal range of motion and neck supple. No rigidity.  Lymphadenopathy:     Cervical: No cervical adenopathy.  Skin:    Findings: No erythema or rash.  Neurological:     Cranial Nerves: No cranial nerve deficit.     Motor: No abnormal muscle tone.     Coordination: Coordination normal.     Deep Tendon Reflexes: Reflexes normal.  Psychiatric:        Behavior: Behavior normal.        Thought Content: Thought content normal.        Judgment: Judgment normal.     Lab Results  Component Value Date   WBC 9.5 08/04/2024   HGB 13.9 08/04/2024   HCT 41.0 08/04/2024   PLT 286 08/04/2024   GLUCOSE 300 (H) 08/04/2024   CHOL 116 05/24/2023   TRIG 125.0 05/24/2023   HDL 39.70 05/24/2023   LDLCALC 51 05/24/2023   ALT 23 08/04/2024   AST 18 08/04/2024   NA 142 08/04/2024   K 3.8 08/04/2024   CL 111 08/04/2024   CREATININE 0.60 08/04/2024   BUN 8 08/04/2024   CO2 21 (L) 08/04/2024   TSH 1.89 05/17/2024   HGBA1C 7.2 (H) 05/24/2023    CT ABDOMEN PELVIS W CONTRAST Result Date: 08/04/2024 CLINICAL DATA:  Pelvic pressure x1 month with vaginal bleeding x2 weeks. EXAM: CT ABDOMEN AND PELVIS WITH CONTRAST TECHNIQUE: Multidetector CT imaging of the abdomen and pelvis was performed using the standard protocol following bolus administration of intravenous contrast. RADIATION DOSE REDUCTION: This exam was performed according to the departmental dose-optimization program which includes automated exposure control, adjustment of the mA and/or  kV according to patient size and/or use of iterative reconstruction technique. CONTRAST:  75mL OMNIPAQUE  IOHEXOL  350 MG/ML SOLN COMPARISON:  September 15, 2023 FINDINGS: Lower chest: No acute abnormality. Hepatobiliary: There is diffuse fatty infiltration of the liver parenchyma. No focal liver abnormality is seen. Status post cholecystectomy. No biliary dilatation. Pancreas: Unremarkable. No  pancreatic ductal dilatation or surrounding inflammatory changes. Spleen: Normal in size without focal abnormality. Adrenals/Urinary Tract: Adrenal glands are unremarkable. Kidneys are normal, without renal calculi, focal lesion, or hydronephrosis. Bladder is unremarkable. Stomach/Bowel: Stomach is within normal limits. Appendix appears normal. Stool is seen throughout the large bowel, more prominently within the mid to distal sigmoid colon. No evidence of bowel wall thickening, distention, or inflammatory changes. Noninflamed diverticula are seen throughout the large bowel. Vascular/Lymphatic: No significant vascular findings are present. No enlarged abdominal or pelvic lymph nodes. Reproductive: Uterus and bilateral adnexa are unremarkable. Other: No abdominal wall hernia or abnormality. No abdominopelvic ascites. Musculoskeletal: No acute or significant osseous findings. IMPRESSION: 1. Hepatic steatosis. 2. Colonic diverticulosis. 3. Evidence of prior cholecystectomy. Electronically Signed   By: Suzen Dials M.D.   On: 08/04/2024 17:23    Assessment & Plan:   Problem List Items Addressed This Visit     Bacterial vaginosis   On Clindamycin      Crohn disease (HCC)   Constipated Use LOC      Diabetes mellitus type 1, controlled (HCC) - Primary   On the insulin  pump      Vagina bleeding   C/o passing 2 huge clots vaginally (on Depo progesterone  for years) plus bleeding Appt w/Dr Rutherford on 08/13/24  Off work x 1 week         No orders of the defined types were placed in this encounter.     Follow-up: Return in about 3 months (around 11/08/2024) for a follow-up visit.  Marolyn Noel, MD

## 2024-08-08 NOTE — Assessment & Plan Note (Signed)
 Constipated Use LOC

## 2024-08-08 NOTE — Assessment & Plan Note (Signed)
 On the insulin  pump

## 2024-08-08 NOTE — Assessment & Plan Note (Addendum)
 C/o passing 2 huge clots vaginally (on Depo progesterone  for years) plus bleeding Appt w/Dr Rutherford on 08/13/24  Off work x 1 week

## 2024-08-13 ENCOUNTER — Other Ambulatory Visit (HOSPITAL_COMMUNITY): Payer: Self-pay

## 2024-08-13 DIAGNOSIS — N921 Excessive and frequent menstruation with irregular cycle: Secondary | ICD-10-CM | POA: Diagnosis not present

## 2024-08-13 DIAGNOSIS — R102 Pelvic and perineal pain unspecified side: Secondary | ICD-10-CM | POA: Diagnosis not present

## 2024-08-13 MED ORDER — NORETHINDRONE ACETATE 5 MG PO TABS
5.0000 mg | ORAL_TABLET | Freq: Every day | ORAL | 0 refills | Status: AC
  Filled 2024-08-13 (×2): qty 30, 30d supply, fill #0

## 2024-08-14 ENCOUNTER — Other Ambulatory Visit: Payer: Self-pay

## 2024-08-15 ENCOUNTER — Encounter (HOSPITAL_BASED_OUTPATIENT_CLINIC_OR_DEPARTMENT_OTHER): Payer: Self-pay

## 2024-08-16 ENCOUNTER — Ambulatory Visit (HOSPITAL_BASED_OUTPATIENT_CLINIC_OR_DEPARTMENT_OTHER): Admitting: Family

## 2024-08-16 ENCOUNTER — Encounter (HOSPITAL_BASED_OUTPATIENT_CLINIC_OR_DEPARTMENT_OTHER): Payer: Self-pay | Admitting: Family

## 2024-08-16 ENCOUNTER — Encounter (HOSPITAL_BASED_OUTPATIENT_CLINIC_OR_DEPARTMENT_OTHER): Payer: Self-pay

## 2024-08-16 ENCOUNTER — Ambulatory Visit: Admitting: Sports Medicine

## 2024-08-16 VITALS — BP 132/90 | HR 96 | Resp 17 | Ht 66.0 in | Wt 168.0 lb

## 2024-08-16 DIAGNOSIS — I10 Essential (primary) hypertension: Secondary | ICD-10-CM | POA: Diagnosis not present

## 2024-08-16 DIAGNOSIS — I2510 Atherosclerotic heart disease of native coronary artery without angina pectoris: Secondary | ICD-10-CM | POA: Diagnosis not present

## 2024-08-16 DIAGNOSIS — E785 Hyperlipidemia, unspecified: Secondary | ICD-10-CM

## 2024-08-16 NOTE — Patient Instructions (Addendum)
 Medication Instructions:  NO CHANGES  *If you need a refill on your cardiac medications before your next appointment, please call your pharmacy*   Follow-Up:   Your next appointment:    6 months with Reche NP or Dr. Raford    Other Instructions We will send a MyChart message in 1 week to check on your BP         Tips to Measure your Blood Pressure Correctly  To determine whether you have hypertension, a medical professional will take a blood pressure reading. How you prepare for the test, the position of your arm, and other factors can change a blood pressure reading by 10% or more. That could be enough to hide high blood pressure, start you on a drug you don't really need, or lead your doctor to incorrectly adjust your medications.  National and international guidelines offer specific instructions for measuring blood pressure. If a doctor, nurse, or medical assistant isn't doing it right, don't hesitate to ask him or her to get with the guidelines.  Here's what you can do to ensure a correct reading:  Don't drink a caffeinated beverage or smoke during the 30 minutes before the test.  Sit quietly for five minutes before the test begins.  During the measurement, sit in a chair with your feet on the floor and your arm supported so your elbow is at about heart level.  The inflatable part of the cuff should completely cover at least 80% of your upper arm, and the cuff should be placed on bare skin, not over a shirt.  Don't talk during the measurement.  Have your blood pressure measured twice, with a brief break in between. If the readings are different by 5 points or more, have it done a third time.  In 2017, new guidelines from the American Heart Association, the [employer] of [employer], and nine other health organizations lowered the diagnosis of high blood pressure to 130/80 mm Hg or higher for all adults. The guidelines also redefined the various blood pressure  categories to now include normal, elevated, Stage 1 hypertension, Stage 2 hypertension, and hypertensive crisis (see Blood pressure categories).  Blood pressure categories  Blood pressure category SYSTOLIC (upper number)  DIASTOLIC (lower number)  Normal Less than 120 mm Hg and Less than 80 mm Hg  Elevated 120-129 mm Hg and Less than 80 mm Hg  High blood pressure: Stage 1 hypertension 130-139 mm Hg or 80-89 mm Hg  High blood pressure: Stage 2 hypertension 140 mm Hg or higher or 90 mm Hg or higher  Hypertensive crisis (consult your doctor immediately) Higher than 180 mm Hg and/or Higher than 120 mm Hg  Source: American Heart Association and American Stroke Association. For more on getting your blood pressure under control, buy Controlling Your Blood Pressure, a Special Health Report from [facility].   Blood Pressure Log   Date   Time  Blood Pressure  Position  Example: Nov 1 9 AM 124/78 sitting

## 2024-08-16 NOTE — Progress Notes (Signed)
 Cardiology Office Note:  .   Date:  08/16/2024  ID:  Cindy Long, DOB [birth year], MRN [id number] PCP: Garald Karlynn GAILS, MD  [facility] Providers Cardiologist:  Annabella Scarce, MD    History of Present Illness: .   Cindy Long is a 48 y.o. female history of hypertension, hyperlipidemia, DM2, GERD, Crohn's disease, nonobstructive CAD.   Prior echo 08/2018 LVEF 55%, normal diastolic function.  Exercise Myoview 06/2018 achieving 7 METS with no ischemia.   Established with Dr. Scarce 11/2021.  Coronary CTA 11/2021 minimal plaque in the LAD in the 92nd percentile score.  Monitor revealed rare PAC/PVC.  Echocardiogram 02/2022 normal wall motion and LVEF.  Chest pain during ED visit at that time felt to be related to possible spasm or myopericarditis.  She was last seen 04/21/23. Due to hypotension her Spironolactone  was reduced to 12.5mg  daily.   Presents today for follow-up independently.  Pleasant lady who works at [employer] primary care performing Medicare wellness visits. She was evaluated by Dr. Rutherford this week for gyn concerns and has plan for follow up and testing in 2 weeks. From a cardiac standpoint she is doing well. Denies chest pain, shortness of breath, edema, palpitations. She occasionally walks for exercise, eats at home and out, drinks mostly water. She does not monitor BP at home. Discussed BP elevated today and at several clinic visits. She endorses caregiver/family stressors. Discussed option for counseling if needed.   ROS: Please see the history of present illness.    All other systems reviewed and are negative.   Studies Reviewed: SABRA    EKG Interpretation Date/Time:  Friday August 16 2024 10:51:04 EDT Ventricular Rate:  91 PR Interval:  146 QRS Duration:  66 QT Interval:  356 QTC Calculation: 437 R Axis:   -33  Text Interpretation: Normal sinus rhythm Left axis deviation When compared with ECG of 19-Dec-2022 12:38, No significant change was  found Confirmed by Vannie Mora ([id number]) on 08/16/2024 10:54:28 AM    Cardiac Studies & Procedures   ______________________________________________________________________________________________   STRESS TESTS  MYOCARDIAL PERFUSION IMAGING 07/06/2018   ECHOCARDIOGRAM  ECHOCARDIOGRAM COMPLETE 03/05/2022  Narrative ECHOCARDIOGRAM REPORT    Patient Name:   Cindy Long Date of Exam: 03/05/2022 Medical Rec #:  [id number]          Height:       66.0 in Accession #:    [id number]         Weight:       154.6 lb Date of Birth:  Dec 04, 1975           BSA:          1.793 m Patient Age:    46 years           BP:           114/77 mmHg Patient Gender: F                  HR:           94 bpm. Exam Location:  Inpatient  Procedure: 2D Echo, Cardiac Doppler and Color Doppler  Indications:    Elevated troponin  History:        Patient has prior history of Echocardiogram examinations, most recent 08/09/2018.  Sonographer:    Vernell Hammersmith RDCS Referring Phys: [id number] DANIEL V THOMPSON  IMPRESSIONS   1. No defined RWMA. Left ventricular ejection fraction, by estimation, is 55%. The left ventricle has normal function. The left ventricle  has no regional wall motion abnormalities. Left ventricular diastolic parameters were normal. 2. Right ventricular systolic function is normal. The right ventricular size is normal. 3. The mitral valve is normal in structure. No evidence of mitral valve regurgitation. No evidence of mitral stenosis. 4. The aortic valve is normal in structure. Aortic valve regurgitation is not visualized. No aortic stenosis is present. 5. The inferior vena cava is normal in size with greater than 50% respiratory variability, suggesting right atrial pressure of 3 mmHg.  FINDINGS Left Ventricle: No defined RWMA. Left ventricular ejection fraction, by estimation, is 55%. The left ventricle has normal function. The left ventricle has no regional wall motion abnormalities. The left  ventricular internal cavity size was normal in size. There is no left ventricular hypertrophy. Left ventricular diastolic parameters were normal.  Right Ventricle: The right ventricular size is normal. No increase in right ventricular wall thickness. Right ventricular systolic function is normal.  Left Atrium: Left atrial size was normal in size.  Right Atrium: Right atrial size was normal in size.  Pericardium: There is no evidence of pericardial effusion.  Mitral Valve: The mitral valve is normal in structure. No evidence of mitral valve regurgitation. No evidence of mitral valve stenosis.  Tricuspid Valve: The tricuspid valve is normal in structure. Tricuspid valve regurgitation is not demonstrated. No evidence of tricuspid stenosis.  Aortic Valve: The aortic valve is normal in structure. Aortic valve regurgitation is not visualized. No aortic stenosis is present. Aortic valve mean gradient measures 2.0 mmHg. Aortic valve peak gradient measures 4.1 mmHg. Aortic valve area, by VTI measures 2.94 cm.  Pulmonic Valve: The pulmonic valve was normal in structure. Pulmonic valve regurgitation is not visualized. No evidence of pulmonic stenosis.  Aorta: The aortic root is normal in size and structure.  Venous: The inferior vena cava is normal in size with greater than 50% respiratory variability, suggesting right atrial pressure of 3 mmHg.  IAS/Shunts: No atrial level shunt detected by color flow Doppler.   LEFT VENTRICLE PLAX 2D LVIDd:         3.80 cm   Diastology LVIDs:         2.60 cm   LV e' medial:    8.73 cm/s LV PW:         1.10 cm   LV E/e' medial:  8.0 LV IVS:        1.10 cm   LV e' lateral:   14.70 cm/s LVOT diam:     2.10 cm   LV E/e' lateral: 4.7 LV SV:         51 LV SV Index:   28 LVOT Area:     3.46 cm   RIGHT VENTRICLE RV Basal diam:  3.00 cm RV S prime:     14.30 cm/s TAPSE (M-mode): 1.9 cm  LEFT ATRIUM             Index        RIGHT ATRIUM           Index LA  diam:        3.30 cm 1.84 cm/m   RA Area:     14.50 cm LA Vol (A2C):   43.2 ml 24.10 ml/m  RA Volume:   31.90 ml  17.80 ml/m LA Vol (A4C):   52.0 ml 29.01 ml/m LA Biplane Vol: 51.2 ml 28.56 ml/m AORTIC VALVE AV Area (Vmax):    2.63 cm AV Area (Vmean):   2.72 cm AV Area (VTI):  2.94 cm AV Vmax:           101.00 cm/s AV Vmean:          69.900 cm/s AV VTI:            0.173 m AV Peak Grad:      4.1 mmHg AV Mean Grad:      2.0 mmHg LVOT Vmax:         76.80 cm/s LVOT Vmean:        54.800 cm/s LVOT VTI:          0.147 m LVOT/AV VTI ratio: 0.85  AORTA Ao Root diam: 3.20 cm Ao Asc diam:  3.20 cm  MITRAL VALVE MV Area (PHT): 4.49 cm    SHUNTS MV Decel Time: 169 msec    Systemic VTI:  0.15 m MV E velocity: 69.80 cm/s  Systemic Diam: 2.10 cm MV A velocity: 88.60 cm/s MV E/A ratio:  0.79  Maude Emmer MD Electronically signed by Maude Emmer MD Signature Date/Time: 03/05/2022/5:07:43 PM    Final    MONITORS  LONG TERM MONITOR (3-14 DAYS) 11/29/2021  Narrative 3 Day Zio Monitor  Quality: Fair.  Baseline artifact. Predominant rhythm: Sinus rhythm Average heart rate: 67 bpm Max heart rate: 143 bpm Min heart rate: 94 bpm Pauses >2.5 seconds: None  Rare PACs and PVCs  Tiffany C. Raford, MD, [facility] 12/06/2021 12:08 PM   CT SCANS  CT CORONARY MORPH W/CTA COR W/SCORE 11/29/2021  Addendum 11/29/2021  6:07 PM ADDENDUM REPORT: 11/29/2021 18:04  CLINICAL DATA:  Chest pain  EXAM: Cardiac/Coronary CTA  TECHNIQUE: A non-contrast, gated CT scan was obtained with axial slices of 3 mm through the heart for calcium  scoring. Calcium  scoring was performed using the Agatston method. A 120 kV prospective, gated, contrast cardiac scan was obtained. Gantry rotation speed was 250 msecs and collimation was 0.6 mm. Two sublingual nitroglycerin  tablets (0.8 mg) were given. The 3D data set was reconstructed in 5% intervals of the 35-75% of the R-R cycle. Diastolic phases  were analyzed on a dedicated workstation using MPR, MIP, and VRT modes. The patient received 95 cc of contrast.  FINDINGS: Image quality: Excellent.  Noise artifact is: Limited.  Coronary Arteries:  Normal coronary origin.  Right dominance.  Left main: The left main is a large caliber vessel with a normal take off from the left coronary cusp that bifurcates to form a left anterior descending artery and a left circumflex artery. There is no plaque or stenosis.  Left anterior descending artery: There is minimal mixed density plaque in the proximal LAD (<25%). The mid and distal segments are patent. The LAD gives off 1 patent diagonal branch.  Left circumflex artery: The LCX is non-dominant and patent with no evidence of plaque or stenosis. The LCX gives off 2 patent obtuse marginal branches.  Right coronary artery: The RCA is dominant with normal take off from the right coronary cusp. There is no evidence of plaque or stenosis. The RCA terminates as a PDA and right posterolateral branch without evidence of plaque or stenosis.  Right Atrium: Right atrial size is within normal limits.  Right Ventricle: The right ventricular cavity is within normal limits.  Left Atrium: Left atrial size is normal in size with no left atrial appendage filling defect.  Left Ventricle: The ventricular cavity size is within normal limits. There are no stigmata of prior infarction. A small diverticulum is present in the basal inferior segment.  Pulmonary arteries: Normal in size without  proximal filling defect.  Pulmonary veins: Normal pulmonary venous drainage.  Pericardium: Normal thickness with no significant effusion or calcium  present.  Cardiac valves: The aortic valve is trileaflet without significant calcification. The mitral valve is normal structure without significant calcification.  Aorta: Normal caliber with no significant disease.  Extra-cardiac findings: See attached  radiology report for non-cardiac structures.  IMPRESSION: 1. Coronary calcium  score of 1.6. This was 92nd percentile for age-, sex, and race-matched controls.  2. Normal coronary origin with right dominance.  3. Minimal mixed density plaque (<25%) in the proximal LAD.  4. Small diverticulum in the basal inferior segment. Suspect this is benign finding.  RECOMMENDATIONS: 1. Minimal non-obstructive CAD (0-24%). Consider non-atherosclerotic causes of chest pain. Consider preventive therapy and risk factor modification.  Darryle Decent, MD   Electronically Signed By: Darryle Decent M.D. On: 11/29/2021 18:04  Narrative EXAM: OVER-READ INTERPRETATION  CT CHEST  The following report is an over-read performed by radiologist Dr. Franky Crease of [facility] on 11/29/2021. This over-read does not include interpretation of cardiac or coronary anatomy or pathology. The coronary CTA interpretation by the cardiologist is attached.  COMPARISON:  07/23/2017  FINDINGS: Vascular: Heart is normal size.  Aorta normal caliber.  Mediastinum/Nodes: No adenopathy  Lungs/Pleura: No confluent opacity or effusion.  Upper Abdomen: Imaging into the upper abdomen demonstrates no acute findings.  Musculoskeletal: Chest wall soft tissues are unremarkable. No acute bony abnormality.  IMPRESSION: No acute or significant extracardiac abnormality.  Electronically Signed: By: Franky Crease M.D. On: 11/29/2021 17:31     ______________________________________________________________________________________________        Risk Assessment/Calculations:     HYPERTENSION CONTROL Vitals:   08/16/24 1045 08/16/24 1110  BP: (!) 142/88 (!) 132/90    The patient's blood pressure is elevated above target today.  In order to address the patient's elevated BP: Blood pressure will be monitored at home to determine if medication changes need to be made. (check home BP readings for 1  week)          Physical Exam:   VS:  BP (!) 132/90   Pulse 96   Resp 17   Ht 5' 6 (1.676 m)   Wt 168 lb (76.2 kg)   SpO2 96%   BMI 27.12 kg/m    Wt Readings from Last 3 Encounters:  08/16/24 168 lb (76.2 kg)  08/08/24 169 lb 6.4 oz (76.8 kg)  07/26/24 166 lb 6.4 oz (75.5 kg)    BP Readings from Last 3 Encounters:  08/16/24 (!) 132/90  08/08/24 (!) 140/92  08/04/24 129/78    GEN: Well nourished, well developed in no acute distress NECK: No JVD; No carotid bruits CARDIAC: RRR, no murmurs, rubs, gallops RESPIRATORY:  Clear to auscultation without rales, wheezing or rhonchi  ABDOMEN: Soft, non-tender, non-distended EXTREMITIES:  No edema; No deformity   ASSESSMENT AND PLAN: .    Nonobstructive CAD - 2023 coronary CTA: calcium  score 1.6, 92nd percentile; stable with no anginal symptoms; no indication for ischemia evaluation Continue GDMT: aspirin  81mg , metoprolol  succinate 25mg  daily, atorvastatin  20mg  daily Heart healthy diet and regular cardiovascular exercise encouraged.   DM2-  05/2024 A1c 8.7; follows with Dr. Faythe  Appreciate utilization of Farxiga  for cardioprotective benefit   HTN, goal <130/80 - Elevated in office today and at outside clinic visits per chart review. Endorses life stressors which may be contributory. Does not routinely monitor BP at home.  Will continue losartan  50mg  daily, spironolactone  12.5mg  daily, metoprolol  succinate 25mg   daily She will monitor BP at home for 1 week and follow up via MyChart  Consider increasing losartan  dose or change to olmesartan or valsartan; may consider discontinuing spirolactone in absence of edema for simplification of regimen  HLD, LDL goal <70 - 05/2024 LDL 62. Stable on and tolerating atorvastatin  20mg  daily. Will continue therapy as prescribed. Encouraged heart healthy diet and regular exercise.  Palpitations / PSVT - Asymptomatic, controlled on metoprolol  succinate 25mg  daily without use of PRN metoprolol  succinate  12.5mg . Encouraged hydration, regular exercise, and caffeine avoidance.       Dispo: follow up in 6 months with Dr. Raford or Reche Finder NP  Signed, Reche GORMAN Finder, NP

## 2024-08-17 ENCOUNTER — Other Ambulatory Visit (HOSPITAL_COMMUNITY): Payer: Self-pay

## 2024-08-19 ENCOUNTER — Other Ambulatory Visit: Payer: Self-pay

## 2024-08-19 ENCOUNTER — Other Ambulatory Visit (HOSPITAL_COMMUNITY): Payer: Self-pay

## 2024-08-21 ENCOUNTER — Encounter: Payer: Self-pay | Admitting: Gastroenterology

## 2024-08-21 ENCOUNTER — Other Ambulatory Visit (HOSPITAL_COMMUNITY): Payer: Self-pay

## 2024-08-22 ENCOUNTER — Other Ambulatory Visit (HOSPITAL_COMMUNITY): Payer: Self-pay

## 2024-08-24 ENCOUNTER — Other Ambulatory Visit (HOSPITAL_COMMUNITY): Payer: Self-pay

## 2024-08-29 NOTE — Progress Notes (Deleted)
 [facility] [address] [place] [address] Phone: [phone]   Assessment and Plan:     ***    Pertinent previous records reviewed include ***   Follow Up: ***     Subjective:   I, Cindy Long, am serving as a Neurosurgeon for Doctor Morene Mace   Chief Complaint: bilat knee pain    HPI:    03/29/2024 Patient is a 48 year old female with bialt knee pain. Patient states would like CSI bilateral. Pain is starting to come back since last visit    08/30/2024 Patient states   Relevant Historical Information: DM type II, hypertension, Crohn's  Additional pertinent review of systems negative.   Current Outpatient Medications:    acetaminophen  (TYLENOL ) 500 MG tablet, Take 1,000 mg by mouth every 6 (six) hours as needed for moderate pain or headache., Disp: , Rfl:    albuterol  (VENTOLIN  HFA) 108 (90 Base) MCG/ACT inhaler, Inhale 1-2 puffs into the lungs every 6 (six) hours as needed for wheezing or shortness of breath., Disp: 18 g, Rfl: 5   aspirin  EC 81 MG tablet, Take 1 tablet (81 mg total) by mouth daily. Swallow whole., Disp: 90 tablet, Rfl: 3   atorvastatin  (LIPITOR) 20 MG tablet, Take 1 tablet (20 mg total) by mouth daily., Disp: 90 tablet, Rfl: 3   Bacillus Coagulans-Inulin (PROBIOTIC) 1-250 BILLION-MG CAPS, Take 1 capsule by mouth daily., Disp: , Rfl:    clindamycin (CLEOCIN) 2 % vaginal cream, Place 1 Applicatorful vaginally at bedtime., Disp: 40 g, Rfl: 0   Continuous Glucose Sensor (DEXCOM G7 SENSOR) MISC, Change sensor every 10 days, Disp: 9 each, Rfl: 4   dapagliflozin  propanediol (FARXIGA ) 10 MG TABS tablet, Take 1 tablet (10 mg total) by mouth daily., Disp: 90 tablet, Rfl: 3   escitalopram  (LEXAPRO ) 20 MG tablet, Take 2 tablets (40 mg total) by mouth daily. (Patient taking differently: Take 20 mg by mouth daily.), Disp: 180 tablet, Rfl: 3   fluconazole  (DIFLUCAN ) 150 MG tablet, Take 1 tablet by mouth  daily once as needed yeast infection--this is a prescription for 3 courses as needed, Disp: 13 tablet, Rfl: 0   Fremanezumab -vfrm (AJOVY ) 225 MG/1.5ML SOAJ, Inject 225 mg into the skin every 30 (thirty) days., Disp: 1.5 mL, Rfl: 11   gabapentin  (NEURONTIN ) 300 MG capsule, Take 1 capsule (300 mg total) by mouth at bedtime., Disp: 90 capsule, Rfl: 1   HYDROcodone -acetaminophen  (NORCO/VICODIN) 5-325 MG tablet, Take 1 tablet by mouth every 6 (six) hours as needed for severe pain (pain score 7-10)., Disp: 20 tablet, Rfl: 0   hyoscyamine  (LEVBID ) 0.375 MG 12 hr tablet, Take 1 tablet  by mouth as needed as directed (30 days)., Disp: 30 tablet, Rfl: 1   hyoscyamine  (LEVBID ) 0.375 MG 12 hr tablet, Take 1 tablet (0.375 mg total) by mouth every 12 (twelve) hours., Disp: 60 tablet, Rfl: 3   inFLIXimab  (REMICADE  IV), Inject into the vein every 8 (eight) weeks. Pt. Is unsure of dosing, but goes to have this done every 8 weeks, Disp: , Rfl:    Insulin  Disposable Pump (OMNIPOD 5 DEXG7G6 PODS GEN 5) MISC, Change pod every 2 days (total insulin  dose 100 units), Disp: 30 each, Rfl: 3   Insulin  Disposable Pump (OMNIPOD 5 G6 INTRO, GEN 5,) KIT, Change pod every 2 days as directed (Total daily dose of 100 units), Disp: 1 kit, Rfl: 0   insulin  glargine (LANTUS  SOLOSTAR) 100 UNIT/ML Solostar Pen, Inject  20-50 (titrate up to 50) Units into the skin daily as directed., Disp: 30 mL, Rfl: 4   insulin  lispro (HUMALOG ) 100 UNIT/ML injection, Inject up to 200 units per day/via insulin  pump, Disp: 60 mL, Rfl: 11   Insulin  Pen Needle 32G X 4 MM MISC, by Does not apply route. BD U/F Pen Needles Nano 4 mm x 32 G; use with insulin , Disp: , Rfl:    Insulin  Pen Needle 32G X 4 MM MISC, Use three times daily., Disp: 300 each, Rfl: 4   insulin  regular human CONCENTRATED (HUMULIN  R U-500 KWIKPEN) 500 UNIT/ML KwikPen, Inject 5-50 Units into the skin 3 (three) times daily with meals. Take 10 units in the morning, 45 units at lunch, 25 units at  dinner. (Patient taking differently: Inject 5-50 Units into the skin 3 (three) times daily with meals. Take 25 units in the morning, 50 units at lunch, 25 units at dinner.), Disp: 24 mL, Rfl: 3   LORazepam  (ATIVAN ) 1 MG tablet, Take 0.5 tablets (0.5 mg total) by mouth daily as needed., Disp: 60 tablet, Rfl: 1   losartan  (COZAAR ) 50 MG tablet, Take 1 tablet (50 mg total) by mouth daily., Disp: 90 tablet, Rfl: 3   Magnesium  250 MG TABS, Take 1 tablet by mouth daily., Disp: , Rfl:    medroxyPROGESTERone  Acetate 150 MG/ML SUSY, Inject 1 syringe into the muscle every 11-12 weeks, Disp: 1 mL, Rfl: 11   medroxyPROGESTERone  Acetate 150 MG/ML SUSY, Inject 1 mL (150 mg total) into the muscle every 3 (three) months. (every 11 - 12 weeks), Disp: 1 mL, Rfl: 11   medroxyPROGESTERone  Acetate 150 MG/ML SUSY, Inject 1 ml into the muscle every 11 - 12 weeks, Disp: 1 mL, Rfl: 11   metoprolol  succinate (TOPROL -XL) 25 MG 24 hr tablet, Take 1 tablet (25 mg total) by mouth daily. May take additional half tablet as needed for tachycardia, palpitations., Disp: 135 tablet, Rfl: 3   naproxen  (NAPROSYN ) 500 MG tablet, Take 1 tablet (500 mg total) by mouth 2 (two) times daily as needed for headache., Disp: 60 tablet, Rfl: 2   norethindrone (AYGESTIN) 5 MG tablet, Take 1 tablet (5 mg total) by mouth daily., Disp: 30 tablet, Rfl: 0   nystatin -triamcinolone  (MYCOLOG II) cream, APPLY TO THE AFFECTED AREA(S) TWO TIMES DAILY AS DIRECTED, Disp: 20 g, Rfl: 2   phentermine  (ADIPEX-P ) 37.5 MG tablet, Take 1 tablet (37.5 mg total) by mouth daily before breakfast., Disp: 30 tablet, Rfl: 2   promethazine  (PHENERGAN ) 25 MG tablet, TAKE 1 TABLET BY MOUTH EVERY 8 HOURS, Disp: 90 tablet, Rfl: 1   Riboflavin (VITAMIN B-2 PO), Take 1 tablet by mouth daily., Disp: , Rfl:    rizatriptan  (MAXALT ) 10 MG tablet, Take 1 tablet (10 mg total) by mouth once as needed for up to 1 dose for migraine (Migraine headaches). May repeat in 2 hours if needed, Disp:  18 tablet, Rfl: 6   spironolactone  (ALDACTONE ) 25 MG tablet, Take 0.5 tablets (12.5 mg total) by mouth daily., Disp: 45 tablet, Rfl: 3   SYRINGE-NEEDLE, DISP, 3 ML (B-D 3CC LUER-LOK SYR 25GX5/8) 25G X 5/8 3 ML MISC, Use to inject B12 under the skin, Disp: 50 each, Rfl: 3   Vitamin D -Vitamin K (VITAMIN K2-VITAMIN D3) 90-125 MCG CAPS, one capsule Orally daily, Disp: , Rfl:    Objective:     There were no vitals filed for this visit.    There is no height or weight on file to calculate BMI.  Physical Exam:    ***   Electronically signed by:  Odis Mace [facility] 7:33 AM 08/29/24

## 2024-08-30 ENCOUNTER — Ambulatory Visit: Admitting: Sports Medicine

## 2024-09-05 ENCOUNTER — Encounter: Payer: Self-pay | Admitting: Internal Medicine

## 2024-09-09 ENCOUNTER — Encounter: Payer: Self-pay | Admitting: Radiology

## 2024-09-20 ENCOUNTER — Ambulatory Visit (INDEPENDENT_AMBULATORY_CARE_PROVIDER_SITE_OTHER): Admitting: *Deleted

## 2024-09-20 VITALS — BP 130/75 | HR 85 | Temp 99.2°F | Resp 18 | Ht 66.0 in | Wt 165.4 lb

## 2024-09-20 DIAGNOSIS — K50919 Crohn's disease, unspecified, with unspecified complications: Secondary | ICD-10-CM | POA: Diagnosis not present

## 2024-09-20 DIAGNOSIS — Z3042 Encounter for surveillance of injectable contraceptive: Secondary | ICD-10-CM | POA: Diagnosis not present

## 2024-09-20 MED ORDER — ACETAMINOPHEN 325 MG PO TABS
650.0000 mg | ORAL_TABLET | Freq: Once | ORAL | Status: AC
  Administered 2024-09-20: 650 mg via ORAL
  Filled 2024-09-20: qty 2

## 2024-09-20 MED ORDER — DIPHENHYDRAMINE HCL 25 MG PO CAPS
25.0000 mg | ORAL_CAPSULE | Freq: Once | ORAL | Status: AC
  Administered 2024-09-20: 25 mg via ORAL
  Filled 2024-09-20: qty 1

## 2024-09-20 MED ORDER — SODIUM CHLORIDE 0.9 % IV SOLN
5.0000 mg/kg | Freq: Once | INTRAVENOUS | Status: AC
  Administered 2024-09-20: 400 mg via INTRAVENOUS
  Filled 2024-09-20: qty 40

## 2024-09-20 MED ORDER — METHYLPREDNISOLONE SODIUM SUCC 40 MG IJ SOLR
40.0000 mg | Freq: Once | INTRAMUSCULAR | Status: AC
  Administered 2024-09-20: 40 mg via INTRAVENOUS
  Filled 2024-09-20: qty 1

## 2024-09-20 NOTE — Progress Notes (Signed)
 Diagnosis: Crohn's Disease  Provider:  Mannam, Praveen MD  Procedure: IV Infusion  IV Type: Peripheral, IV Location: L Antecubital  Avsola  (infliximab -axxq), Dose: 400 mg  Infusion Start Time: 0951 am  Infusion Stop Time: 1214 pm  Post Infusion IV Care: Observation period completed and Peripheral IV Discontinued  Discharge: Condition: Good, Destination: Home . AVS Declined  Performed by:  Trudy Lamarr LABOR, RN

## 2024-09-27 ENCOUNTER — Other Ambulatory Visit (HOSPITAL_COMMUNITY): Payer: Self-pay

## 2024-09-27 ENCOUNTER — Other Ambulatory Visit: Payer: Self-pay | Admitting: Internal Medicine

## 2024-09-30 ENCOUNTER — Other Ambulatory Visit (HOSPITAL_COMMUNITY): Payer: Self-pay

## 2024-09-30 ENCOUNTER — Other Ambulatory Visit: Payer: Self-pay

## 2024-09-30 MED ORDER — PHENTERMINE HCL 37.5 MG PO TABS
37.5000 mg | ORAL_TABLET | Freq: Every day | ORAL | 2 refills | Status: AC
  Filled 2024-09-30: qty 30, 30d supply, fill #0
  Filled 2024-11-07 – 2024-11-26 (×2): qty 30, 30d supply, fill #1

## 2024-10-05 ENCOUNTER — Other Ambulatory Visit (HOSPITAL_COMMUNITY): Payer: Self-pay

## 2024-10-10 ENCOUNTER — Other Ambulatory Visit (HOSPITAL_COMMUNITY): Payer: Self-pay

## 2024-10-11 ENCOUNTER — Other Ambulatory Visit (HOSPITAL_COMMUNITY): Payer: Self-pay

## 2024-10-11 MED ORDER — MISOPROSTOL 200 MCG PO TABS
100.0000 ug | ORAL_TABLET | ORAL | 0 refills | Status: DC
  Filled 2024-10-11: qty 1, 1d supply, fill #0

## 2024-10-12 ENCOUNTER — Other Ambulatory Visit (HOSPITAL_COMMUNITY): Payer: Self-pay

## 2024-10-12 ENCOUNTER — Other Ambulatory Visit: Payer: Self-pay

## 2024-10-14 ENCOUNTER — Other Ambulatory Visit: Payer: Self-pay

## 2024-10-14 NOTE — Progress Notes (Signed)
 Spoke w/ via phone for pre-op interview---Cindy Long needs dos----  UPT, [facility]   Dr orders pending    Long results------ COVID test -----patient states asymptomatic no test needed Arrive at -------0645 NPO after MN NO Solid Food.  Clear liquids from MN until---sip of water with am meds Pre-Surgery Ensure or G2:  Med rec completed Medications to take morning of surgery -----Toprol  XL, Ativan , lexapro , bring inhaler Diabetic medication -----  GLP1 agonist last dose:N/A GLP1 instructions:  Patient instructed no nail polish to be worn day of surgery Patient instructed to bring photo id and insurance card day of surgery Patient aware to have Driver (ride ) / caregiver    for 24 hours after surgery - Jermaine (spouse) Patient Special Instructions -----take 1/2 dose of insulin  night before surgery Pre-Op special Instructions -----to stop ASA unless ok with Dr. To continue  Patient verbalized understanding of instructions that were given at this phone interview. Patient denies chest pain, sob, fever, cough at the interview.

## 2024-10-15 ENCOUNTER — Other Ambulatory Visit: Payer: Self-pay

## 2024-10-18 ENCOUNTER — Ambulatory Visit (HOSPITAL_COMMUNITY)

## 2024-10-18 ENCOUNTER — Encounter (HOSPITAL_COMMUNITY): Payer: Self-pay | Admitting: Obstetrics and Gynecology

## 2024-10-18 ENCOUNTER — Encounter: Admission: RE | Disposition: A | Payer: Self-pay | Source: Home / Self Care | Attending: Obstetrics and Gynecology

## 2024-10-18 ENCOUNTER — Ambulatory Visit (HOSPITAL_COMMUNITY)
Admission: RE | Admit: 2024-10-18 | Discharge: 2024-10-18 | Disposition: A | Attending: Obstetrics and Gynecology | Admitting: Obstetrics and Gynecology

## 2024-10-18 ENCOUNTER — Other Ambulatory Visit: Payer: Self-pay

## 2024-10-18 DIAGNOSIS — D250 Submucous leiomyoma of uterus: Secondary | ICD-10-CM | POA: Diagnosis not present

## 2024-10-18 DIAGNOSIS — Z01818 Encounter for other preprocedural examination: Secondary | ICD-10-CM

## 2024-10-18 DIAGNOSIS — N921 Excessive and frequent menstruation with irregular cycle: Secondary | ICD-10-CM | POA: Diagnosis not present

## 2024-10-18 DIAGNOSIS — I10 Essential (primary) hypertension: Secondary | ICD-10-CM | POA: Diagnosis not present

## 2024-10-18 DIAGNOSIS — I2510 Atherosclerotic heart disease of native coronary artery without angina pectoris: Secondary | ICD-10-CM | POA: Diagnosis not present

## 2024-10-18 DIAGNOSIS — J45909 Unspecified asthma, uncomplicated: Secondary | ICD-10-CM | POA: Diagnosis not present

## 2024-10-18 DIAGNOSIS — K5090 Crohn's disease, unspecified, without complications: Secondary | ICD-10-CM | POA: Diagnosis not present

## 2024-10-18 DIAGNOSIS — Z794 Long term (current) use of insulin: Secondary | ICD-10-CM | POA: Diagnosis not present

## 2024-10-18 DIAGNOSIS — E119 Type 2 diabetes mellitus without complications: Secondary | ICD-10-CM | POA: Diagnosis not present

## 2024-10-18 DIAGNOSIS — D571 Sickle-cell disease without crisis: Secondary | ICD-10-CM | POA: Diagnosis not present

## 2024-10-18 DIAGNOSIS — N938 Other specified abnormal uterine and vaginal bleeding: Secondary | ICD-10-CM | POA: Diagnosis not present

## 2024-10-18 HISTORY — PX: MYOSURE RESECTION: SHX7611

## 2024-10-18 HISTORY — PX: DILATION AND CURETTAGE OF UTERUS: SHX78

## 2024-10-18 HISTORY — PX: HYSTEROSCOPY WITH MYOMECTOMY: SHX7591

## 2024-10-18 LAB — BASIC METABOLIC PANEL WITH GFR
Anion gap: 9 (ref 5–15)
BUN: 9 mg/dL (ref 6–20)
CO2: 22 mmol/L (ref 22–32)
Calcium: 9.3 mg/dL (ref 8.9–10.3)
Chloride: 109 mmol/L (ref 98–111)
Creatinine, Ser: 0.65 mg/dL (ref 0.44–1.00)
GFR, Estimated: >60 mL/min (ref >60)
Glucose, Bld: 178 mg/dL — ABNORMAL HIGH (ref 70–99)
Potassium: 3.9 mmol/L (ref 3.5–5.1)
Sodium: 140 mmol/L (ref 135–145)

## 2024-10-18 LAB — HEMOGLOBIN A1C
Hgb A1c MFr Bld: 10.5 % — ABNORMAL HIGH (ref 4.8–5.6)
Mean Plasma Glucose: 254.65 mg/dL

## 2024-10-18 LAB — TYPE AND SCREEN
ABO/RH(D): A POS
Antibody Screen: POSITIVE
PT AG Type: NEGATIVE

## 2024-10-18 LAB — CBC
HCT: 45 % (ref 36.0–46.0)
Hemoglobin: 15.2 g/dL — ABNORMAL HIGH (ref 12.0–15.0)
MCH: 27.7 pg (ref 26.0–34.0)
MCHC: 33.8 g/dL (ref 30.0–36.0)
MCV: 82.1 fL (ref 80.0–100.0)
Platelets: 260 K/uL (ref 150–400)
RBC: 5.48 MIL/uL — ABNORMAL HIGH (ref 3.87–5.11)
RDW: 12.7 % (ref 11.5–15.5)
WBC: 7.9 K/uL (ref 4.0–10.5)
nRBC: 0 % (ref 0.0–0.2)

## 2024-10-18 LAB — GLUCOSE, CAPILLARY
Glucose-Capillary: 167 mg/dL — ABNORMAL HIGH (ref 70–99)
Glucose-Capillary: 153 mg/dL — ABNORMAL HIGH (ref 70–99)

## 2024-10-18 LAB — POCT PREGNANCY, URINE: Preg Test, Ur: NEGATIVE

## 2024-10-18 SURGERY — HYSTEROSCOPY WITH MYOMECTOMY
Anesthesia: General | Site: Uterus

## 2024-10-18 MED ORDER — FENTANYL CITRATE (PF) 100 MCG/2ML IJ SOLN
INTRAMUSCULAR | Status: AC
  Filled 2024-10-18: qty 2

## 2024-10-18 MED ORDER — ORAL CARE MOUTH RINSE: 15.0000 mL | Freq: Once | OROMUCOSAL | Status: AC

## 2024-10-18 MED ORDER — AMISULPRIDE (ANTIEMETIC) 5 MG/2ML IV SOLN: 10.0000 mg | Freq: Once | INTRAVENOUS | Status: DC | PRN

## 2024-10-18 MED ORDER — SODIUM CHLORIDE 0.9 % IR SOLN
Status: DC | PRN
  Administered 2024-10-18: 3000 mL

## 2024-10-18 MED ORDER — LIDOCAINE 2% (20 MG/ML) 5 ML SYRINGE: INTRAMUSCULAR | Status: DC | PRN

## 2024-10-18 MED ORDER — ONDANSETRON HCL 4 MG/2ML IJ SOLN
INTRAMUSCULAR | Status: AC
  Filled 2024-10-18: qty 2

## 2024-10-18 MED ORDER — LIDOCAINE 2% (20 MG/ML) 5 ML SYRINGE
INTRAMUSCULAR | Status: AC
  Filled 2024-10-18: qty 5

## 2024-10-18 MED ORDER — LACTATED RINGERS IV SOLN: INTRAVENOUS | Status: DC

## 2024-10-18 MED ORDER — ONDANSETRON HCL 4 MG/2ML IJ SOLN
4.0000 mg | Freq: Once | INTRAMUSCULAR | Status: AC | PRN
  Administered 2024-10-18: 4 mg via INTRAVENOUS

## 2024-10-18 MED ORDER — PROPOFOL 10 MG/ML IV BOLUS
INTRAVENOUS | Status: DC | PRN
  Administered 2024-10-18: 160 mg via INTRAVENOUS

## 2024-10-18 MED ORDER — FENTANYL CITRATE (PF) 250 MCG/5ML IJ SOLN
INTRAMUSCULAR | Status: DC | PRN
  Administered 2024-10-18: 25 ug via INTRAVENOUS

## 2024-10-18 MED ORDER — KETOROLAC TROMETHAMINE 30 MG/ML IJ SOLN
INTRAMUSCULAR | Status: AC
  Filled 2024-10-18: qty 1

## 2024-10-18 MED ORDER — ACETAMINOPHEN 500 MG PO TABS
ORAL_TABLET | ORAL | Status: AC
  Filled 2024-10-18: qty 2

## 2024-10-18 MED ORDER — LIDOCAINE HCL (CARDIAC) PF 100 MG/5ML IV SOSY
PREFILLED_SYRINGE | INTRAVENOUS | Status: DC | PRN
  Administered 2024-10-18: 100 mg via INTRAVENOUS

## 2024-10-18 MED ORDER — POVIDONE-IODINE 10 % EX SWAB: 2.0000 | Freq: Once | CUTANEOUS | Status: DC

## 2024-10-18 MED ORDER — ACETAMINOPHEN 500 MG PO TABS
1000.0000 mg | ORAL_TABLET | Freq: Once | ORAL | Status: AC
  Administered 2024-10-18: 1000 mg via ORAL

## 2024-10-18 MED ORDER — ONDANSETRON HCL 4 MG/2ML IJ SOLN
INTRAMUSCULAR | Status: DC | PRN
  Administered 2024-10-18: 4 mg via INTRAVENOUS

## 2024-10-18 MED ORDER — DEXAMETHASONE SOD PHOSPHATE PF 10 MG/ML IJ SOLN
INTRAMUSCULAR | Status: DC | PRN
  Administered 2024-10-18: 5 mg via INTRAVENOUS

## 2024-10-18 MED ORDER — FENTANYL CITRATE (PF) 100 MCG/2ML IJ SOLN: 25.0000 ug | INTRAMUSCULAR | Status: DC | PRN

## 2024-10-18 MED ORDER — CHLORHEXIDINE GLUCONATE 0.12 % MT SOLN
15.0000 mL | Freq: Once | OROMUCOSAL | Status: AC
  Administered 2024-10-18: 15 mL via OROMUCOSAL

## 2024-10-18 MED ORDER — CHLORHEXIDINE GLUCONATE 0.12 % MT SOLN
OROMUCOSAL | Status: AC
  Filled 2024-10-18: qty 15

## 2024-10-18 MED ORDER — SCOPOLAMINE 1 MG/3DAYS TD PT72
1.0000 | MEDICATED_PATCH | TRANSDERMAL | Status: DC
  Administered 2024-10-18: 1 mg via TRANSDERMAL

## 2024-10-18 MED ORDER — SCOPOLAMINE 1 MG/3DAYS TD PT72
MEDICATED_PATCH | TRANSDERMAL | Status: AC
  Filled 2024-10-18: qty 1

## 2024-10-18 MED ORDER — MIDAZOLAM HCL (PF) 2 MG/2ML IJ SOLN
INTRAMUSCULAR | Status: DC | PRN
  Administered 2024-10-18: 2 mg via INTRAVENOUS

## 2024-10-18 MED ADMIN — Ephedrine Sulfate Prefilled Syringe 25 MG/5ML (5 MG/ML): 10 mg | INTRAVENOUS | NDC 51754425001

## 2024-10-18 MED ADMIN — Ketorolac Tromethamine Inj 30 MG/ML: 30 mg | INTRAVENOUS | NDC 72266011801

## 2024-10-18 MED ADMIN — Ephedrine Sulfate Prefilled Syringe 25 MG/5ML (5 MG/ML): 5 mg | INTRAVENOUS | NDC 51754425001

## 2024-10-18 MED FILL — Midazolam HCl Inj 2 MG/2ML (Base Equivalent): INTRAMUSCULAR | Qty: 2 | Status: AC

## 2024-10-18 MED FILL — Propofol IV Emul 200 MG/20ML (10 MG/ML): INTRAVENOUS | Qty: 20 | Status: AC

## 2024-10-18 SURGICAL SUPPLY — 17 items
CATH ROBINSON RED A/P 16FR (CATHETERS) ×2 IMPLANT
DEVICE MYOSURE LITE (MISCELLANEOUS) IMPLANT
DEVICE MYOSURE REACH (MISCELLANEOUS) IMPLANT
DILATOR CANAL MILEX (MISCELLANEOUS) IMPLANT
GLOVE BIOGEL PI IND STRL 7.0 (GLOVE) IMPLANT
GLOVE BIOGEL PI MICRO STRL 6.5 (GLOVE) ×2 IMPLANT
GLOVE SURG UNDER POLY LF SZ7 (GLOVE) ×2 IMPLANT
GOWN STRL REUS W/ TWL LRG LVL3 (GOWN DISPOSABLE) ×2 IMPLANT
KIT PROCED FLUENT PRO FLT212S (KITS) ×2 IMPLANT
KIT TURNOVER KIT B (KITS) ×2 IMPLANT
PACK VAGINAL MINOR WOMEN LF (CUSTOM PROCEDURE TRAY) ×2 IMPLANT
SEAL CERVICAL OMNI LOK (ABLATOR) IMPLANT
SEAL ROD LENS SCOPE MYOSURE (ABLATOR) ×2 IMPLANT
SOL .9 NS 3000ML IRR UROMATIC (IV SOLUTION) IMPLANT
SYSTEM TISS REMOVAL MYOSURE XL (MISCELLANEOUS) IMPLANT
TOWEL GREEN STERILE FF (TOWEL DISPOSABLE) ×2 IMPLANT
UNDERPAD 30X36 HEAVY ABSORB (UNDERPADS AND DIAPERS) ×2 IMPLANT

## 2024-10-18 NOTE — H&P (Signed)
 Cindy Long is an 48 y.o. female. 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 Pt is scheduled for dx hysteroscopy, hysteroscopic resection of SM fibroid using myosure, D&C.  [place] evaluation showed SM  and IM fibroids  Pertinent Gynecological History: Menses: amenorrhea due to DMPA Bleeding: dysfunctional uterine bleeding Contraception: none DES exposure: denies Blood transfusions: none Sexually transmitted diseases: no past history Previous GYN Procedures: c/s  Last mammogram: normal Date: 2025 Last pap: normal Date: 2025 OB History: G2, P2   Menstrual History: Menarche age: n/a No LMP recorded. Patient has had an injection.    Past Medical History:  Diagnosis Date   Allergy    Anemia    Anxiety    Arthritis    Asthma    Chest pressure 11/18/2021   Crohn disease (HCC)    Depression    Diabetes mellitus    IDDM, Type 2   Essential hypertension 11/18/2021   GERD (gastroesophageal reflux disease)    no meds currently   High cholesterol    Hypertension    Mastitis    right breast   Postpartum care following cesarean delivery (2/10) 12/17/2013   Pure hypercholesterolemia 11/18/2021   Sickle cell anemia (HCC)    Sickle cell trait    Past Surgical History:  Procedure Laterality Date   CERVICAL CERCLAGE     CERVICAL CERCLAGE N/A 06/21/2013   Procedure: McDonald CERCLAGE CERVICAL;  Surgeon: Dickie DELENA Carder, MD;  Location: [facility];  Service: Gynecology;  Laterality: N/A;   CESAREAN SECTION  2008   CESAREAN SECTION N/A 12/17/2013   Procedure: Repeat CESAREAN SECTION with Cerclage Removal;  Surgeon: Dickie DELENA Carder, MD;  Location: [facility];  Service: Obstetrics;  Laterality: N/A;  EDD: 12/22/13   CHOLECYSTECTOMY     CHOLECYSTECTOMY OPEN  08/08/2011   GANGLION CYST EXCISION Right 1997   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    Family History  Problem Relation Age of Onset   Hypertension Mother    Hypercholesterolemia Mother     Hyperlipidemia Mother    Migraines Sister    Other Sister        brain tumor   Headache Sister    Diabetes Maternal Aunt    Migraines Maternal Grandmother    Hypertension Maternal Grandmother    Cancer Maternal Grandmother        breast, colon   Diabetes Maternal Grandmother    Headache Maternal Grandmother    Arthritis Maternal Grandmother    Hyperlipidemia Maternal Grandmother    Hypertension Maternal Grandfather    Alcohol abuse Maternal Grandfather    COPD Maternal Grandfather    Asthma Daughter    Asthma Daughter    Heart attack Cousin 73   Depression Maternal Uncle    Diabetes Maternal Uncle    Drug abuse Maternal Uncle     Social History:  reports that she has never smoked. She has never been exposed to tobacco smoke. She has never used smokeless tobacco. She reports current alcohol use of about 3.0 standard drinks of alcohol per week. She reports that she does not use drugs.  Allergies: Allergies[1]  No medications prior to admission.    Review of Systems  All other systems reviewed and are negative.   Height 5' 6 (1.676 m), weight 74.8 kg. Physical Exam Constitutional:      Appearance: Normal appearance.  HENT:     Head: Atraumatic.  Eyes:     Extraocular Movements: Extraocular movements intact.  Cardiovascular:  Rate and Rhythm: Regular rhythm.     Heart sounds: Normal heart sounds.  Pulmonary:     Breath sounds: Normal breath sounds.  Abdominal:     Palpations: Abdomen is soft.  Genitourinary:    General: Normal vulva.     Comments: Vagina; no lesion Cervix parous Uterus AV Adnexa nl Musculoskeletal:        General: Normal range of motion.     Cervical back: Neck supple.  Skin:    General: Skin is warm and dry.  Neurological:     General: No focal deficit present.     Mental Status: She is alert and oriented to person, place, and time.  Psychiatric:        Mood and Affect: Mood normal.        Behavior: Behavior normal.     No  results found for this or any previous visit (from the past 24 hours).  No results found.  Assessment/Plan: Submucous fibroid Breakthrough bleeding on DMPA DM P) dx hysteroscopy, hysteroscopic resection of SM fibroid using myosure, d&C. Procedure explained. Risk of surgery reviewed including infection,bleeding, uterine perforation and its risk, thermal injruy, fluid overload and its mgmt, uterine perforation( 11/998) and its risk, possible need for blood transfusion and its risk( HIV, acute rxn, hepatitis). All ? answered  Sada Mazzoni A Lukka Black 10/18/2024, 4:22 AM     [1]  Allergies Allergen Reactions   Metronidazole Swelling    Throat swelling  Other Reaction(s): throat itching   Shellfish Allergy Swelling    Swelling of the throat  Other Reaction(s): Unknown   Empagliflozin-Linagliptin Other (See Comments)    headaches   Lisinopril Other (See Comments)   Metformin  Hcl Other (See Comments)   Oxybutynin      Bad dreams   Prednisone  Other (See Comments)    Palpitations w/high dose   Dulaglutide Rash   Liraglutide Rash   Nurtec [Rimegepant Sulfate] Rash    she did not like how it made her feel

## 2024-10-18 NOTE — Progress Notes (Signed)
 CBG 167 this morning, pt has omnipod on with 1 unit/hr basal rate. Per [facility] Dr. Corinne will hold additional insulin  at this time

## 2024-10-18 NOTE — Discharge Instructions (Signed)
CALL  IF TEMP>100.4, NOTHING PER VAGINA X 1 WK, CALL IF SOAKING A MAXI  PAD EVERY HOUR OR MORE FREQUENTLY 

## 2024-10-18 NOTE — Interval H&P Note (Signed)
 History and Physical Interval Note:  10/18/2024 7:13 AM  Cindy Long  has presented today for surgery, with the diagnosis of AUB submucous fibroid.  The various methods of treatment have been discussed with the patient and family. After consideration of risks, benefits and other options for treatment, the patient has consented to  Procedures: HYSTEROSCOPY WITH MYOMECTOMY (N/A) DILATION AND CURETTAGE MYOSURE RESECTION as a surgical intervention.  The patient's history has been reviewed, patient examined, no change in status, stable for surgery.  I have reviewed the patient's chart and labs.  Questions were answered to the patient's satisfaction.     [name] A Rance Smithson

## 2024-10-18 NOTE — Transfer of Care (Signed)
 Immediate Anesthesia Transfer of Care Note  Patient: Cindy Long  Procedure(s) Performed: HYSTEROSCOPY WITH SUBMUCOSAL MYOMECTOMY (Uterus) DILATION AND CURETTAGE (Uterus) MYOSURE RESECTION (Uterus) HYSTEROSCOPY, DIAGNOSTIC (Uterus)  Patient Location: PACU  Anesthesia Type:general  Level of Consciousness: drowsy  Airway & Oxygen Therapy: Patient Spontanous Breathing and Patient connected to face mask oxygen  Post-op Assessment: Report given to RN and Post -op Vital signs reviewed and stable  Post vital signs: Reviewed and stable  Last Vitals:  Vitals Value Taken Time  BP 123/75 10/18/24 09:08  Temp    Pulse 74 10/18/24 09:10  Resp 12 10/18/24 09:10  SpO2 100 % 10/18/24 09:10  Vitals shown include unfiled device data.  Last Pain:  Vitals:   10/18/24 0657  TempSrc: Oral  PainSc: 0-No pain      Patients Stated Pain Goal: 5 (10/18/24 0657)  Complications: No notable events documented.

## 2024-10-18 NOTE — Op Note (Signed)
 NAMEBRITTIE, Cindy Long MEDICAL RECORD NO: [id number] ACCOUNT NO: [id number] DATE OF BIRTH: 1976-10-20 FACILITY: [facility] LOCATION: [facility] PHYSICIAN: Audreyana Huntsberry A. Rutherford, MD  Operative Report   DATE OF PROCEDURE: 10/18/2024  PREOPERATIVE DIAGNOSES:   1.  Submucosal fibroid. 2.  Breakthrough bleeding on Depo-Provera .  PROCEDURE:  Diagnostic hysteroscopy, hysteroscopic resection of submucosal fibroid, D and C.  POSTOPERATIVE DIAGNOSES:   1.  Submucosal fibroid. 2.  Breakthrough bleeding on Depo-Provera .  ANESTHESIA:  General.  SURGEON:  Reynoldo Mainer A. Rutherford, MD  ASSISTANT:  None.  DESCRIPTION OF PROCEDURE:  Under adequate general anesthesia, the patient was placed in the dorsal lithotomy position.  She was sterilely prepped and draped in the usual fashion.  The bladder was not catheterized as the patient voided prior to entering  the room.  Examination under anesthesia revealed an anteverted uterus.  No adnexal masses could be appreciated.  A bivalve speculum was placed in the vagina.  Single-tooth tenaculum was placed on the anterior lip of the cervix.  The cervix was then  easily dilated to a #17 Pratt dilator.  The diagnostic hysteroscope was introduced in the uterine cavity.  Atrophic endometrium was noted.  A submucosal fibroid was noted anteriorly.  Using the Reach resectoscope, the fibroid was resected as well as the  endometrial cavity.  When all tissue was felt to have been removed, all instruments were then removed from the vagina.  SPECIMEN:  Labeled endometrial curetting with fibroid resection was sent to pathology.  FLUID DEFICIT:  185.  ESTIMATED BLOOD LOSS:  1 mL.  COMPLICATIONS:  None.  CONDITION:  The patient tolerated the procedure well and was transferred to the recovery room in stable condition.   VAI D: 10/18/2024 9:03:53 am T: 10/18/2024 9:10:00 am  JOB: [id number]/ [id number]

## 2024-10-18 NOTE — Anesthesia Postprocedure Evaluation (Signed)
 Anesthesia Post Note  Patient: Cindy Long  Procedure(s) Performed: HYSTEROSCOPY WITH SUBMUCOSAL MYOMECTOMY (Uterus) DILATION AND CURETTAGE (Uterus) MYOSURE RESECTION (Uterus) HYSTEROSCOPY, DIAGNOSTIC (Uterus)     Patient location during evaluation: PACU Anesthesia Type: General Level of consciousness: awake and alert Pain management: pain level controlled Vital Signs Assessment: post-procedure vital signs reviewed and stable Respiratory status: spontaneous breathing, nonlabored ventilation and respiratory function stable Cardiovascular status: blood pressure returned to baseline and stable Postop Assessment: no apparent nausea or vomiting Anesthetic complications: no   No notable events documented.  Last Vitals:  Vitals:   10/18/24 0930 10/18/24 0945  BP: (!) 141/85 (!) 155/93  Pulse: 82 79  Resp: 14 10  Temp:  36.4 C  SpO2: 97% 96%    Last Pain:  Vitals:   10/18/24 0930  TempSrc:   PainSc: 0-No pain                 Garnette FORBES Skillern

## 2024-10-18 NOTE — Anesthesia Preprocedure Evaluation (Addendum)
 Anesthesia Evaluation  Patient identified by MRN, date of birth, ID band Patient awake    Reviewed: Allergy & Precautions, NPO status , Patient's Chart, lab work & pertinent test results, reviewed documented beta blocker date and time   Airway Mallampati: II  TM Distance: >3 FB Neck ROM: Full    Dental  (+) Teeth Intact, Dental Advisory Given   Pulmonary asthma    Pulmonary exam normal breath sounds clear to auscultation       Cardiovascular hypertension, Pt. on home beta blockers and Pt. on medications (-) angina + CAD  (-) Past MI Normal cardiovascular exam Rhythm:Regular Rate:Normal     Neuro/Psych  Headaches PSYCHIATRIC DISORDERS Anxiety Depression       GI/Hepatic Neg liver ROS,GERD  ,,Crohn disease   Endo/Other  diabetes, Type 2, Insulin  Dependent    Renal/GU negative Renal ROS     Musculoskeletal  (+) Arthritis ,    Abdominal   Peds  Hematology  (+) Blood dyscrasia, Sickle cell anemia and anemia   Anesthesia Other Findings Day of surgery medications reviewed with the patient.  Reproductive/Obstetrics AUB Submucous uterine fibroid                              Anesthesia Physical Anesthesia Plan  ASA: 3  Anesthesia Plan: General   Post-op Pain Management: Tylenol  PO (pre-op)* and Toradol  IV (intra-op)*   Induction: Intravenous  PONV Risk Score and Plan: 4 or greater and Midazolam, Dexamethasone , Ondansetron  and Scopolamine  patch - Pre-op  Airway Management Planned: LMA  Additional Equipment:   Intra-op Plan:   Post-operative Plan: Extubation in OR  Informed Consent: I have reviewed the patients History and Physical, chart, labs and discussed the procedure including the risks, benefits and alternatives for the proposed anesthesia with the patient or authorized representative who has indicated his/her understanding and acceptance.     Dental advisory given  Plan  Discussed with: CRNA  Anesthesia Plan Comments:          Anesthesia Quick Evaluation

## 2024-10-18 NOTE — Brief Op Note (Signed)
 10/18/2024  9:04 AM  PATIENT:  Cindy Long  48 y.o. female  PRE-OPERATIVE DIAGNOSIS:  AUB submucous fibroid, BTB on DMPA  POST-OPERATIVE DIAGNOSIS:  AUB,  submucous fibroid, BTB on DMPA  PROCEDURE:  Procedures: HYSTEROSCOPY WITH SUBMUCOSAL MYOMECTOMY (N/A) DILATION AND CURETTAGE MYOSURE RESECTION HYSTEROSCOPY, DIAGNOSTIC  SURGEON:  Surgeons and Role:    * Rutherford Gain, MD - Primary  PHYSICIAN ASSISTANT:   ASSISTANTS: none   ANESTHESIA:   general  EBL:  2ml  BLOOD ADMINISTERED:none  DRAINS: none   LOCAL MEDICATIONS USED:  NONE  SPECIMEN:  Source of Specimen:  emc with fibroid resection  DISPOSITION OF SPECIMEN:  PATHOLOGY  COUNTS:  YES  TOURNIQUET:  * No tourniquets in log *  DICTATION: .Other Dictation: Dictation Number [id number]  PLAN OF CARE: Discharge to home after PACU  PATIENT DISPOSITION:  PACU - hemodynamically stable.   Delay start of Pharmacological VTE agent (>24hrs) due to surgical blood loss or risk of bleeding: no

## 2024-10-19 ENCOUNTER — Encounter (HOSPITAL_COMMUNITY): Payer: Self-pay | Admitting: Obstetrics and Gynecology

## 2024-10-21 LAB — SURGICAL PATHOLOGY

## 2024-10-29 ENCOUNTER — Other Ambulatory Visit: Payer: Self-pay

## 2024-11-02 ENCOUNTER — Other Ambulatory Visit (HOSPITAL_BASED_OUTPATIENT_CLINIC_OR_DEPARTMENT_OTHER): Payer: Self-pay | Admitting: Family

## 2024-11-02 DIAGNOSIS — I25118 Atherosclerotic heart disease of native coronary artery with other forms of angina pectoris: Secondary | ICD-10-CM

## 2024-11-02 DIAGNOSIS — I10 Essential (primary) hypertension: Secondary | ICD-10-CM

## 2024-11-04 ENCOUNTER — Other Ambulatory Visit: Payer: Self-pay

## 2024-11-04 ENCOUNTER — Other Ambulatory Visit (HOSPITAL_COMMUNITY): Payer: Self-pay

## 2024-11-04 ENCOUNTER — Encounter: Payer: Self-pay | Admitting: Internal Medicine

## 2024-11-04 MED ORDER — METOPROLOL SUCCINATE ER 25 MG PO TB24
25.0000 mg | ORAL_TABLET | Freq: Every day | ORAL | 3 refills | Status: AC
  Filled 2024-11-04: qty 135, 90d supply, fill #0

## 2024-11-08 ENCOUNTER — Other Ambulatory Visit: Payer: Self-pay

## 2024-11-11 ENCOUNTER — Other Ambulatory Visit: Payer: Self-pay

## 2024-11-14 ENCOUNTER — Other Ambulatory Visit: Payer: Self-pay | Admitting: Internal Medicine

## 2024-11-15 ENCOUNTER — Ambulatory Visit

## 2024-11-15 VITALS — BP 126/76 | HR 95 | Temp 98.5°F | Resp 16 | Ht 66.0 in | Wt 163.0 lb

## 2024-11-15 DIAGNOSIS — K50919 Crohn's disease, unspecified, with unspecified complications: Secondary | ICD-10-CM

## 2024-11-15 MED ORDER — METHYLPREDNISOLONE SODIUM SUCC 40 MG IJ SOLR
40.0000 mg | Freq: Once | INTRAMUSCULAR | Status: AC
  Administered 2024-11-15: 40 mg via INTRAVENOUS
  Filled 2024-11-15: qty 1

## 2024-11-15 MED ORDER — TIRZEPATIDE-WEIGHT MANAGEMENT 2.5 MG/0.5ML ~~LOC~~ SOLN: 2.5000 mg | SUBCUTANEOUS | 5 refills | Status: AC

## 2024-11-15 MED ORDER — DIPHENHYDRAMINE HCL 25 MG PO CAPS
25.0000 mg | ORAL_CAPSULE | Freq: Once | ORAL | Status: AC
  Administered 2024-11-15: 25 mg via ORAL
  Filled 2024-11-15: qty 1

## 2024-11-15 MED ORDER — ACETAMINOPHEN 325 MG PO TABS
650.0000 mg | ORAL_TABLET | Freq: Once | ORAL | Status: AC
  Administered 2024-11-15: 650 mg via ORAL
  Filled 2024-11-15: qty 2

## 2024-11-15 MED ORDER — SODIUM CHLORIDE 0.9 % IV SOLN
5.0000 mg/kg | Freq: Once | INTRAVENOUS | Status: AC
  Administered 2024-11-15: 400 mg via INTRAVENOUS
  Filled 2024-11-15: qty 40

## 2024-11-15 NOTE — Progress Notes (Signed)
 Diagnosis:  Crohn's  Provider:  Praveen Mannam MD  Procedure: IV Infusion  IV Type: Peripheral, IV Location: R Forearm  Avsola  (infliximab -axxq), Dose: 400 mg  Infusion Start Time: 1143  Infusion Stop Time: 1428  Post Infusion IV Care: Peripheral IV Discontinued  Discharge: Condition: Good, Destination: Home . AVS Declined  Performed by:  Donny Childes, RN

## 2024-11-21 ENCOUNTER — Other Ambulatory Visit (HOSPITAL_COMMUNITY): Payer: Self-pay

## 2024-11-22 ENCOUNTER — Other Ambulatory Visit (HOSPITAL_COMMUNITY): Payer: Self-pay

## 2024-11-24 ENCOUNTER — Other Ambulatory Visit (HOSPITAL_COMMUNITY): Payer: Self-pay

## 2024-11-26 ENCOUNTER — Other Ambulatory Visit: Payer: Self-pay

## 2024-11-29 ENCOUNTER — Other Ambulatory Visit (HOSPITAL_COMMUNITY): Payer: Self-pay

## 2024-12-02 ENCOUNTER — Ambulatory Visit: Admitting: Internal Medicine

## 2024-12-10 ENCOUNTER — Other Ambulatory Visit (HOSPITAL_COMMUNITY): Payer: Self-pay

## 2025-01-10 ENCOUNTER — Ambulatory Visit

## 2025-01-14 ENCOUNTER — Ambulatory Visit: Admitting: Neurology
